# Patient Record
Sex: Female | Born: 1955 | Race: White | Hispanic: No | Marital: Married | Smoking: Never smoker
Health system: Southern US, Community
[De-identification: ages and names within clinical notes are randomized; demographics above are authoritative.]

## PROBLEM LIST (undated history)

## (undated) DIAGNOSIS — I10 Essential (primary) hypertension: Secondary | ICD-10-CM

## (undated) DIAGNOSIS — I82409 Acute embolism and thrombosis of unspecified deep veins of unspecified lower extremity: Secondary | ICD-10-CM

## (undated) DIAGNOSIS — C801 Malignant (primary) neoplasm, unspecified: Secondary | ICD-10-CM

## (undated) DIAGNOSIS — E785 Hyperlipidemia, unspecified: Secondary | ICD-10-CM

## (undated) DIAGNOSIS — D259 Leiomyoma of uterus, unspecified: Secondary | ICD-10-CM

## (undated) HISTORY — PX: ABDOMINAL HYSTERECTOMY: SHX81

## (undated) HISTORY — DX: Hyperlipidemia, unspecified: E78.5

## (undated) HISTORY — DX: Malignant (primary) neoplasm, unspecified: C80.1

## (undated) HISTORY — PX: GALLBLADDER SURGERY: SHX652

## (undated) HISTORY — PX: CHOLECYSTECTOMY: SHX55

## (undated) HISTORY — DX: Essential (primary) hypertension: I10

## (undated) HISTORY — DX: Leiomyoma of uterus, unspecified: D25.9

## (undated) HISTORY — PX: COLON SURGERY: SHX602

## (undated) HISTORY — DX: Acute embolism and thrombosis of unspecified deep veins of unspecified lower extremity: I82.409

---

## 2000-07-11 ENCOUNTER — Emergency Department (HOSPITAL_COMMUNITY): Admission: EM | Admit: 2000-07-11 | Discharge: 2000-07-11 | Payer: Self-pay | Admitting: Emergency Medicine

## 2000-07-11 ENCOUNTER — Encounter: Payer: Self-pay | Admitting: Emergency Medicine

## 2000-08-21 ENCOUNTER — Ambulatory Visit (HOSPITAL_COMMUNITY): Admission: RE | Admit: 2000-08-21 | Discharge: 2000-08-21 | Payer: Self-pay | Admitting: Neurosurgery

## 2000-08-21 ENCOUNTER — Encounter: Payer: Self-pay | Admitting: Neurosurgery

## 2000-09-12 ENCOUNTER — Ambulatory Visit (HOSPITAL_COMMUNITY): Admission: RE | Admit: 2000-09-12 | Discharge: 2000-09-12 | Payer: Self-pay | Admitting: Neurosurgery

## 2000-09-12 ENCOUNTER — Encounter: Payer: Self-pay | Admitting: Neurosurgery

## 2000-12-03 ENCOUNTER — Encounter: Payer: Self-pay | Admitting: Obstetrics and Gynecology

## 2000-12-03 ENCOUNTER — Ambulatory Visit (HOSPITAL_COMMUNITY): Admission: RE | Admit: 2000-12-03 | Discharge: 2000-12-03 | Payer: Self-pay | Admitting: Obstetrics and Gynecology

## 2001-01-12 ENCOUNTER — Encounter: Payer: Self-pay | Admitting: Family Medicine

## 2001-01-12 ENCOUNTER — Encounter: Admission: RE | Admit: 2001-01-12 | Discharge: 2001-01-12 | Payer: Self-pay | Admitting: Family Medicine

## 2001-11-30 ENCOUNTER — Other Ambulatory Visit: Admission: RE | Admit: 2001-11-30 | Discharge: 2001-11-30 | Payer: Self-pay | Admitting: Obstetrics and Gynecology

## 2001-12-07 ENCOUNTER — Encounter: Payer: Self-pay | Admitting: Obstetrics and Gynecology

## 2001-12-07 ENCOUNTER — Ambulatory Visit (HOSPITAL_COMMUNITY): Admission: RE | Admit: 2001-12-07 | Discharge: 2001-12-07 | Payer: Self-pay | Admitting: Obstetrics and Gynecology

## 2003-01-19 ENCOUNTER — Encounter: Admission: RE | Admit: 2003-01-19 | Discharge: 2003-01-19 | Payer: Self-pay | Admitting: Internal Medicine

## 2003-01-19 ENCOUNTER — Encounter: Payer: Self-pay | Admitting: Internal Medicine

## 2003-04-07 ENCOUNTER — Other Ambulatory Visit: Admission: RE | Admit: 2003-04-07 | Discharge: 2003-04-07 | Payer: Self-pay | Admitting: Obstetrics and Gynecology

## 2004-01-06 ENCOUNTER — Encounter: Admission: RE | Admit: 2004-01-06 | Discharge: 2004-01-06 | Payer: Self-pay | Admitting: Internal Medicine

## 2004-01-16 ENCOUNTER — Ambulatory Visit (HOSPITAL_COMMUNITY): Admission: RE | Admit: 2004-01-16 | Discharge: 2004-01-16 | Payer: Self-pay | Admitting: Orthopedic Surgery

## 2004-06-15 ENCOUNTER — Observation Stay (HOSPITAL_COMMUNITY): Admission: EM | Admit: 2004-06-15 | Discharge: 2004-06-16 | Payer: Self-pay | Admitting: *Deleted

## 2004-06-20 ENCOUNTER — Ambulatory Visit (HOSPITAL_COMMUNITY): Admission: RE | Admit: 2004-06-20 | Discharge: 2004-06-20 | Payer: Self-pay | Admitting: Internal Medicine

## 2004-11-28 ENCOUNTER — Ambulatory Visit: Payer: Self-pay | Admitting: Hematology & Oncology

## 2004-12-11 ENCOUNTER — Encounter (INDEPENDENT_AMBULATORY_CARE_PROVIDER_SITE_OTHER): Payer: Self-pay | Admitting: Specialist

## 2004-12-11 ENCOUNTER — Inpatient Hospital Stay (HOSPITAL_COMMUNITY): Admission: RE | Admit: 2004-12-11 | Discharge: 2004-12-13 | Payer: Self-pay | Admitting: Obstetrics and Gynecology

## 2004-12-17 ENCOUNTER — Ambulatory Visit (HOSPITAL_COMMUNITY): Admission: RE | Admit: 2004-12-17 | Discharge: 2004-12-17 | Payer: Self-pay | Admitting: Obstetrics and Gynecology

## 2005-07-30 ENCOUNTER — Encounter (INDEPENDENT_AMBULATORY_CARE_PROVIDER_SITE_OTHER): Payer: Self-pay | Admitting: Specialist

## 2005-07-30 ENCOUNTER — Ambulatory Visit (HOSPITAL_COMMUNITY): Admission: RE | Admit: 2005-07-30 | Discharge: 2005-07-31 | Payer: Self-pay

## 2005-10-15 ENCOUNTER — Encounter: Admission: RE | Admit: 2005-10-15 | Discharge: 2005-10-15 | Payer: Self-pay | Admitting: Internal Medicine

## 2007-07-14 ENCOUNTER — Emergency Department (HOSPITAL_COMMUNITY): Admission: EM | Admit: 2007-07-14 | Discharge: 2007-07-14 | Payer: Self-pay | Admitting: Emergency Medicine

## 2010-03-28 ENCOUNTER — Encounter: Admission: RE | Admit: 2010-03-28 | Discharge: 2010-03-28 | Payer: Self-pay | Admitting: Family Medicine

## 2010-04-02 ENCOUNTER — Encounter: Admission: RE | Admit: 2010-04-02 | Discharge: 2010-04-02 | Payer: Self-pay | Admitting: Family Medicine

## 2010-06-28 ENCOUNTER — Encounter: Admission: RE | Admit: 2010-06-28 | Discharge: 2010-06-28 | Payer: Self-pay | Admitting: Gastroenterology

## 2010-08-03 ENCOUNTER — Encounter (INDEPENDENT_AMBULATORY_CARE_PROVIDER_SITE_OTHER): Payer: Self-pay | Admitting: Surgery

## 2010-08-03 ENCOUNTER — Inpatient Hospital Stay (HOSPITAL_COMMUNITY)
Admission: RE | Admit: 2010-08-03 | Discharge: 2010-08-07 | Payer: Self-pay | Source: Home / Self Care | Attending: Surgery | Admitting: Surgery

## 2010-08-03 DIAGNOSIS — C801 Malignant (primary) neoplasm, unspecified: Secondary | ICD-10-CM

## 2010-08-03 HISTORY — DX: Malignant (primary) neoplasm, unspecified: C80.1

## 2010-08-16 ENCOUNTER — Ambulatory Visit: Payer: Self-pay | Admitting: Hematology and Oncology

## 2010-08-23 LAB — CBC WITH DIFFERENTIAL/PLATELET
BASO%: 0.8 % (ref 0.0–2.0)
Basophils Absolute: 0 10*3/uL (ref 0.0–0.1)
EOS%: 1.2 % (ref 0.0–7.0)
Eosinophils Absolute: 0 10*3/uL (ref 0.0–0.5)
HCT: 38.3 % (ref 34.8–46.6)
HGB: 12.9 g/dL (ref 11.6–15.9)
LYMPH%: 42.1 % (ref 14.0–49.7)
MCH: 30.5 pg (ref 25.1–34.0)
MCHC: 33.6 g/dL (ref 31.5–36.0)
MCV: 90.5 fL (ref 79.5–101.0)
MONO#: 0.3 10*3/uL (ref 0.1–0.9)
MONO%: 8.3 % (ref 0.0–14.0)
NEUT#: 1.8 10*3/uL (ref 1.5–6.5)
NEUT%: 47.6 % (ref 38.4–76.8)
Platelets: 229 10*3/uL (ref 145–400)
RBC: 4.23 10*6/uL (ref 3.70–5.45)
RDW: 13.2 % (ref 11.2–14.5)
WBC: 3.8 10*3/uL — ABNORMAL LOW (ref 3.9–10.3)
lymph#: 1.6 10*3/uL (ref 0.9–3.3)

## 2010-08-30 LAB — HYPERCOAGULABLE PANEL, COMPREHENSIVE
AntiThromb III Func: 125 % (ref 76–126)
Anticardiolipin IgA: 3 APL U/mL (ref <22)
Anticardiolipin IgG: 2 GPL U/mL (ref <23)
Anticardiolipin IgM: 1 MPL U/mL (ref <11)
Beta-2 Glyco I IgG: 1 G Units (ref <20)
Beta-2-Glycoprotein I IgA: 5 A Units (ref <20)
Beta-2-Glycoprotein I IgM: 3 M Units (ref <20)
DRVVT: 36.3 secs (ref 36.2–44.3)
Lupus Anticoagulant: NOT DETECTED
PTT Lupus Anticoagulant: 34.8 secs (ref 30.0–45.6)
Protein C Activity: 196 % — ABNORMAL HIGH (ref 75–133)
Protein C, Total: >150 % — ABNORMAL HIGH (ref 70–140)
Protein S Activity: 113 % (ref 69–129)
Protein S Ag, Total: 122 % (ref 70–140)

## 2010-08-30 LAB — COMPREHENSIVE METABOLIC PANEL
ALT: 35 U/L (ref 0–35)
AST: 26 U/L (ref 0–37)
Albumin: 4.5 g/dL (ref 3.5–5.2)
Alkaline Phosphatase: 125 U/L — ABNORMAL HIGH (ref 39–117)
BUN: 13 mg/dL (ref 6–23)
CO2: 28 mEq/L (ref 19–32)
Calcium: 9.4 mg/dL (ref 8.4–10.5)
Chloride: 106 mEq/L (ref 96–112)
Creatinine, Ser: 0.82 mg/dL (ref 0.40–1.20)
Glucose, Bld: 106 mg/dL — ABNORMAL HIGH (ref 70–99)
Potassium: 4.2 mEq/L (ref 3.5–5.3)
Sodium: 144 mEq/L (ref 135–145)
Total Bilirubin: 0.6 mg/dL (ref 0.3–1.2)
Total Protein: 6.8 g/dL (ref 6.0–8.3)

## 2010-08-30 LAB — CEA: CEA: <0.5 ng/mL (ref 0.0–5.0)

## 2010-09-10 ENCOUNTER — Ambulatory Visit (HOSPITAL_COMMUNITY)
Admission: RE | Admit: 2010-09-10 | Discharge: 2010-09-10 | Payer: Self-pay | Source: Home / Self Care | Attending: Hematology and Oncology | Admitting: Hematology and Oncology

## 2010-10-22 LAB — BASIC METABOLIC PANEL
BUN: 6 mg/dL (ref 6–23)
BUN: 12 mg/dL (ref 6–23)
CO2: 27 mEq/L (ref 19–32)
Calcium: 9.5 mg/dL (ref 8.4–10.5)
Chloride: 107 mEq/L (ref 96–112)
Chloride: 104 mEq/L (ref 96–112)
Creatinine, Ser: 0.87 mg/dL (ref 0.4–1.2)
GFR calc Af Amer: >60 mL/min (ref >60)
GFR calc non Af Amer: >60 mL/min (ref >60)
Glucose, Bld: 107 mg/dL — ABNORMAL HIGH (ref 70–99)
Glucose, Bld: 107 mg/dL — ABNORMAL HIGH (ref 70–99)
Sodium: 142 mEq/L (ref 135–145)

## 2010-10-22 LAB — CREATININE, SERUM: GFR calc Af Amer: >60 mL/min (ref >60)

## 2010-10-22 LAB — TYPE AND SCREEN
ABO/RH(D): O POS
Antibody Screen: NEGATIVE

## 2010-10-22 LAB — APTT: aPTT: 29 seconds (ref 24–37)

## 2010-10-22 LAB — PROTIME-INR
INR: 0.91 (ref 0.00–1.49)
Prothrombin Time: 12.5 seconds (ref 11.6–15.2)

## 2010-10-22 LAB — CBC
MCHC: 33.1 g/dL (ref 30.0–36.0)
MCV: 92.8 fL (ref 78.0–100.0)
Platelets: 167 10*3/uL (ref 150–400)
RBC: 3.61 MIL/uL — ABNORMAL LOW (ref 3.87–5.11)
RBC: 4.7 MIL/uL (ref 3.87–5.11)
RDW: 12.6 % (ref 11.5–15.5)
WBC: 4.4 10*3/uL (ref 4.0–10.5)

## 2010-10-22 LAB — SURGICAL PCR SCREEN: Staphylococcus aureus: NEGATIVE

## 2010-10-22 LAB — ABO/RH: ABO/RH(D): O POS

## 2010-12-28 NOTE — Consult Note (Signed)
[name], [name]           ACCOUNT NO.:  [id number]   MEDICAL RECORD NO.:  [id number]          PATIENT TYPE:  EMS   LOCATION:  ED                           FACILITY:  [facility]   PHYSICIAN:  Charles A. Delcambre, MDDATE OF BIRTH:  1956/08/05   DATE OF CONSULTATION:  06/15/2004  DATE OF DISCHARGE:                                   CONSULTATION   This is a 55 year old para 1-0-0-1, LMP started 1-1/2 months ago, off and on  bleeding, on Coumadin therapy 10 mg per day for a DVT of the left foot, for  the last 5 months.  She had last INR 2.7 about 6 weeks ago.  She has not had  her INR checked since that time.  She takes Coumadin 10 mg a day Monday,  Wednesday, Friday, 15 mg Tuesday, Thursday, and Saturday.  She has been on  this dosing schedule.  She noted bleeding to start heavily last evening.  She denies dizziness but is very weak at this time, feels she cannot drive  and for this reason, I have directed her to go the emergency room.  She is  dispositioned to go the emergency room via EMS.  She is in the office with  findings as noted below with pertinent finding of anemia, hematocrit 24.4  and hemoglobin 8.3, pro time 28.7, INR 4.7.   PAST MEDICAL HISTORY:  1.  DVT ? left foot 3 inches long in June 2005.  2.  Gallstones.   PAST SURGICAL HISTORY:  C-section x 1.   MEDICATIONS:  Coumadin as noted above.   ALLERGIES:  No noted allergies.   SOCIAL HISTORY:  No tobacco, ethanol, or drug abuse.  States she is married.   FAMILY HISTORY:  No carcinoma, limited English.  She denies other major  medical illnesses.   REVIEW OF SYSTEMS:  As noted above.   PHYSICAL EXAMINATION:  VITAL SIGNS:  Blood pressure 130/88, pulse 90s.  GENERAL:  Alert and oriented x 3, no distress.  Weight 196 pounds.  Height  5.3 feet 7-3/4 inches.  NECK:  Supple without thyromegaly or adenopathy.  LUNGS:  Clear bilaterally.  BACK:  No CVA.  Vertebral nontender to palpation.  No CVA tenderness  bilaterally.  BREAST EXAM:  Deferred.  Breasts are symmetrical.  HEART:  Regular rate and rhythm without murmur, rub, or gallop.  ABDOMEN:  Soft, nontender.  PELVIC:  Copious amount of bleeding is noted freely flowing from the  cervical os during the first 45 minutes of examination.  Clots were removed  from the cervical os.  The blood in the vagina would not clot.  With further  examination bimanual examination, blood was extruded from the vault.  The  uterus was 12-14 week size, minimally tender, consistent with uterine  fibroids.  On further examination and evaluation, bleeding did slow.   Laboratory was drawn with findings as noted above.  Transvaginal ultrasound  was then taken.  Uterine fibroid was noted, appearing to be submucous, 8 x  10 cm.  Ovaries appeared normal bilaterally.  A small cyst was noted on the  right ovary approximately 2 x  3 cm, simple in appearance.   ASSESSMENT:  1.  Menorrhagia.  2.  Coagulopathy.  3.  Coumadin therapy for deep venous thrombosis, about 5 months into      therapy.  4.  Uterine fibroid.   PLAN:  The patient needs reversal of coagulopathy to a point consistent with  cessation of menses as most likely cause of the uterine bleeding is  coagulopathy at this time.  She gives informed consent.  She understands  that she very likely will need a blood transfusion.  Accepts risks of  hepatitis and HIV exposure.  She understands increased risk of reversal of  anticoagulated state could contribute to recurrence of deep venous  thrombosis with inherent risks of pulmonary embolus and even death, but  current risk inherent with the heavy bleeding poses a significant threat to  her life as well.  Furthermore, a fibroid could be cause of her bleeding but  in light of the significant coagulopathy currently, coagulopathy is most  likely cause of the bleeding.  Endometrial biopsy could be warranted but  with the degree of bleeding at this time, this would be  likely aspirating  blood and is not undertaken.  The patient states she is too weak and the  bleeding is of such nature that she cannot drive.  For this reason, she is  transported to [place] via EMS.  I have spoken to the emergency room doctor  there, who accepts and is aware that the patient is coming.  She will likely  need admission, and I have paged [facility] to arrange for  admission.      CAD/MEDQ  D:  06/15/2004  T:  06/15/2004  Job:  [id number]   cc:   Sharlet Salina, M.D.  [address]  [place]  [place] [address]  Fax: [phone]

## 2010-12-28 NOTE — H&P (Signed)
NAME:  [name], [name]           ACCOUNT NO.:  [id number]   MEDICAL RECORD NO.:  [id number]          PATIENT TYPE:  INP   LOCATION:  NA                            FACILITY:  [facility]   PHYSICIAN:  Charles A. Delcambre, MDDATE OF BIRTH:  [birth year]   DATE OF ADMISSION:  DATE OF DISCHARGE:                                HISTORY & PHYSICAL   ADDENDUM:  This history and physical is dictated from November 20, 2004,  initially for admission on November 27, 2004, now rescheduled for admission on  Dec 11, 2004, for hysterectomy for menorrhagia, severe, with anemia.  She  will be admitted once again to undergo hysterectomy.  Physical exam with  heart and lung exam is updated today.  She did get further workup with a  venous Doppler and left lower extremity normal except for some insufficiency  in the upper lower extremity.  Lower extremity normal.  Area in the foot,  previous clot, unclear at this time.  She has protein C and S normal,  anticardiolipin negative.  Factor V Leiden pending.  Negative prior to this.  Antithrombin-3 slightly elevated.  In discussion with Dr. Marny Lowenstein, her PCP,  she and I both agree that we will use Lovenox starting postop day 1 for five  to six days and start Coumadin.  I would like to go on with 10 mg per day.  She had been maintained on 15 mg per day but did have a coagulopathy, even  necessitating admission with reversal at one point on this dose.  She comes  in with her husband today, gives informed consent once again for surgery, as  she had prior.  They do understand risk of DVT and anticoagulation as stated  in the plan, and we will go on at this point to proceed with surgery, SCDs  perioperatively and anticoagulation as noted with Lovenox and Coumadin.  She  was in agreement and will follow up as directed, n.p.o. past midnight the  evening prior to surgery.  She has been started on Toprol XL 50 mg a day for  hypertension and will take her blood pressure medicine the  morning of  surgery.   Please get a copy of H&P from November 20, 2004, onto chart with this addendum.      CAD/MEDQ  D:  12/07/2004  T:  12/07/2004  Job:  [id number]

## 2010-12-28 NOTE — Op Note (Signed)
[name], [name]           ACCOUNT NO.:  [id number]   MEDICAL RECORD NO.:  [id number]          PATIENT TYPE:  AMB   LOCATION:  DAY                          FACILITY:  [facility]   PHYSICIAN:  Lebron Conners, M.D.   DATE OF BIRTH:  Jul 12, 1956   DATE OF PROCEDURE:  07/30/2005  DATE OF DISCHARGE:                                 OPERATIVE REPORT   PREOPERATIVE DIAGNOSIS:  Symptomatic gallstones.   POSTOPERATIVE DIAGNOSIS:  Symptomatic gallstones.   OPERATION:  Laparoscopic cholecystectomy.   SURGEON:  Lebron Conners, M.D.   ANESTHESIA:  General and local.   DESCRIPTION OF PROCEDURE:  After the patient was monitored and anesthetized  and had routine preparation and draping of the abdomen, I injected local  anesthetic just below the umbilicus and made a 2 cm longitudinal incision at  the upper end of the previous lower midline incision. I dissected down  through the fat and got hemostasis and incised the fascia about 2 cm in the  midline and bluntly entered the peritoneal cavity. I placed a #0 Vicryl  pursestring suture and secured a Hassan cannula and inflated the abdomen  with carbon dioxide. There were a lot of adhesions in the pelvis as  expected. The right upper quadrant was easily except accessible. The  gallbladder was somewhat enlarged and had a good bit of adhesion to its  undersurface. After placement of an 11 mm epigastric port and two 5 mm right  mid abdominal ports through anesthetized sites under direct view, I  retracted the fundus of the gallbladder upward and took down the adhesions  until I could clearly identify the infundibulum. I pulled the infundibulum  laterally and dissected the hepatoduodenal ligament until I clearly  identified the cystic duct emerging from the infundibulum of the gallbladder  and had a nice window anteriorly and posteriorly and saw the cystic artery  crossing the triangle of Calot. I clipped the cystic duct with four clips  and clipped  the cystic artery with three clips and cut the two structures  between the two clips closest to the gallbladder on each. I then used the  cautery and the hook dissector to remove the gallbladder from the  gallbladder fossa and got good hemostasis in the gallbladder fossa. I got  the gallbladder off without making any holes in it. After irrigating the  right upper quadrant and removing the irrigant and observing good  hemostasis, I removed the gallbladder through the umbilical incision and  tied the pursestring suture making sure that I did not trap any viscera.  After removing the remaining irrigant lateral to the  liver, I removed the two lateral ports under direct view and saw no bleeding  from the abdominal wall. I allowed the carbon dioxide to escape through the  epigastric port and removed that as well. I then closed all skin incisions  with intracuticular 4-0 Vicryl and Steri-Strips. The patient was stable  throughout the operation.      Lebron Conners, M.D.  Electronically Signed     WB/MEDQ  D:  07/30/2005  T:  07/31/2005  Job:  [id number]   cc:  Francis P. Modesto Charon, M.D.  Fax: [phone]

## 2010-12-28 NOTE — Discharge Summary (Signed)
NAME:  [name], [name]           ACCOUNT NO.:  [id number]   MEDICAL RECORD NO.:  [id number]          PATIENT TYPE:  INP   LOCATION:  9317                          FACILITY:  [facility]   PHYSICIAN:  Charles A. Delcambre, MDDATE OF BIRTH:  [birth year]   DATE OF ADMISSION:  12/11/2004  DATE OF DISCHARGE:  12/13/2004                                 DISCHARGE SUMMARY   PRIMARY DISCHARGE DIAGNOSES:  1.  Menorrhagia.  2.  Uterine fibroids.  3.  Adenofibroma, left ovary.  4.  Serous cystadenoma, right ovary.   DISPOSITION:  The patient was discharged home to followup in the office in  four days to discontinue staples and to have pro time checked. She was given  convalescent instructions to rest at home, no driving for two weeks, no  heavy lifting of greater than 25 pounds for four weeks. She was to call for  any temperature greater than 100 degrees, any incision redness or drainage  or increasing abdominal pain or other problem. She was instructed to inject  subcutaneously Lovenox 40 mg once a day and to take Coumadin 7.5 mg once a  day, Percocet 5/325, 1-2 p.o. q.4 h p.r.n. #30, a prescription was given.  Hemocyte 1 p.o. q.d. #30 as well as Coumadin 7.5 mg 1 p.o. q.d., #100,  refill x1, prescriptions were given as well.  Lovenox 40 mg #5 injections  with one refill were given as well.   LABORATORY DATA:  Postoperative hematocrit 25.7 and then recheck on next day  on Lovenox hematocrit was 27.0, hemoglobin 9.0, platelets at that time 217.   History and physical is dictated on the chart. Pathology returned as noted  above.   HOSPITAL COURSE:  The patient was admitted and underwent surgery as noted  above. The day of surgery, she had a normal postoperative course. On  postoperative day #1, evening of surgery, she had a Tmax of 100.7. She had a  benign examination, good urine output, no localizing signs or symptoms, most  likely atelectasis was source of low grade temperature. She was started  on  Lovenox and ambulated. Diet was advanced as tolerated. She had spontaneous  return of flatus. On postoperative day #2, she defervesced, had no further  febrile morbidity. Cefoxitin was continued for 24 hours, thereafter  discontinued. She was observed for approximately 12 hours post cefoxitin,  had no febrile morbidity. Clinical exam was benign and she desired to be  discharged home. Thereafter she was given strict precautions as noted above  and was allowed discharge. Followup as noted above.      CAD/MEDQ  D:  12/13/2004  T:  12/13/2004  Job:  [id number]

## 2010-12-28 NOTE — H&P (Signed)
NAME:  [name], [name]           ACCOUNT NO.:  [id number]   MEDICAL RECORD NO.:  [id number]          PATIENT TYPE:  INP   LOCATION:  0102                         [facility]ACILITY:  [facility]   PHYSICIAN:  Jackie Plum, M.D.DATE OF BIRTH:  12-Jul-1956   DATE OF ADMISSION:  06/15/2004  DATE OF DISCHARGE:                                HISTORY & PHYSICAL   CHIEF COMPLAINT:  Bleeding per vagina x1-1/2 months.   HISTORY OF PRESENT ILLNESS:  Patient is a [place] immigrant who presents  with the above complaint.  Patient had been referred to see Dr. Sydnee Cabal of  gynecology by her primary care physician on account of this problem.  According to the consultation report by the PCP, pelvic exam at the office  was significant for a copious amount of bleeding, freely flowing from the  cervical os with clots.  The uterus was enlarged at about 12-14 weeks size  and minimally tender.  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  Subsequent transvaginal ultrasound noted uterine fibromyomata, which was  submucosal, about 8-10 cm with normal-appearing ovaries bilaterally.  A  small cystic right ovary, about 2 x 3 cm, was noted, which was said to be  simple in appearance.  Patient had complained of some dizziness; therefore,  she was referred by EMS to the emergency room for further evaluation and  possibly admission.  On arrival to the ED, patient's temperature was 99.0,  with BP of 143/85, pulse 80, respirations 18, sats 99% on room air.  She was  started on FFP on account of her coagulopathy noted on blood work, during  this admission.  Patient denies any chest pain, palpitations, shortness of  breath.  She complains of lightheadedness, which has improved since ER  evaluation.  She continues to bleed per vagina, but she says her bleeding  has slowed down somewhat since being in the ED.   PAST MEDICAL HISTORY:  History of uterine fibromyomata.  She does have a  history of left lower extremity DVT  and has been on Coumadin since January 16, 2004 for this DVT.  She also gives a history of menorrhagia over the last  few months.  She denies any history of fever or chills.  She denies any  lower abdominal pain.  No history of dysuria or frequency of micturition.  She denies any vaginal discharge.   CURRENT MEDICATIONS:  She is on Coumadin at home.  She takes 10 mg of  Coumadin on Monday, Wednesday, and Friday, and 15 mg daily on the other  days.  She is on Coumadin, as I said earlier, since January 16, 2004 for left  lower extremity DVT (about five months now).   ALLERGIES:  No known medical allergies.   FAMILY HISTORY:  Negative for heart disease.   SOCIAL HISTORY:  Patient smokes no cigarettes nor drink alcohol.   REVIEW OF SYSTEMS:  Significant positives and negatives as noted on the HPI  above, otherwise review of systems is unremarkable.   PHYSICAL EXAMINATION:  VITAL SIGNS:  Temperature 98.3 degrees Fahrenheit, BP  107/69, pulse 74, respirations 20.  Sat of 98% on  room air.  GENERAL:  Patient is not in any acute cardiopulmonary distress; however, she  looks a bit weak.  HEENT:  Pupils are equal, round and reactive to light.  Extraocular  movements are intact.  She has conjunctivae pallor without icterus.  Oropharynx is moist without any injection or erythema.  NECK:  Supple.  No JVD.  No thyromegaly.  LUNGS:  Fascicular breath sounds, which were adequate without any wheezes or  crackles.  HEART:  Regular rate and rhythm without any gallops or murmur.  ABDOMEN:  Full.  Soft.  Nontender.  Bowel sounds were present and  normoactive.  EXTREMITIES:  Negative for any edema or cyanosis.  PELVIC:  Not done.  (Please see dictation regarding pelvic exam by Dr.  Sydnee Cabal of [facility]).   LAB WORK:  Vaginal ultrasound, as noted on the HPI.   WBC count 5.2, hemoglobin 8.2, hematocrit 25.3, MCV 80.5, platelet count  248, pro time 36.6, INR 6.2.  Sodium 139, potassium 4.1, chloride  110, CO2  28.  Glucose 117, BUN 9, creatinine 0.8, bilirubin 0.5, alkaline phosphatase  78, SGOT 20, SGPT 17, total protein 5.8, albumin 3.5, calcium 8.3.   IMPRESSION:  1.  Acute blood loss per vagina secondary to coagulopathy related to      Coumadin toxicity.  2.  Uterine fibromyomata.  3.  Anemia secondary to diagnosis #1.  4.  Menorrhagia.   PLAN OF CARE:  Patient is hemodynamically stable.  Will admit her to a  telemetry bed.  Will transfuse her with FFP and give her subcutaneous  vitamin K.  Her hematologic, i.e., hemoglobin will be monitored carefully  and PRBC transfusions will be instituted as needed.   Patient has been on Coumadin for DVT.  It is not clear to me whether this  was due to any discernable etiology; however, I believe that __________  treatment will suffice.  If surgery is unclear, and recommend that patient  not be put back on Coumadin after discharge, but this will need to be  clarified with the patient's PCP, regarding workup that was done for her  when she had a DVT.     Geor   GO/MEDQ  D:  06/15/2004  T:  06/15/2004  Job:  [id number]   cc:   Sharlet Salina, M.D.  [address]  [place]  [place] [address]  Fax: [phone]   Leonette Most A. Sydnee Cabal, MD  Fax: [phone]

## 2010-12-28 NOTE — Op Note (Signed)
NAME:  JERMIAH, HOWTON           ACCOUNT NO.:  [id number]   MEDICAL RECORD NO.:  [id number]          PATIENT TYPE:  INP   LOCATION:  9399                          FACILITY:  [facility]   PHYSICIAN:  Charles A. Delcambre, MDDATE OF BIRTH:  October 22, 1955   DATE OF PROCEDURE:  12/11/2004  DATE OF DISCHARGE:                                 OPERATIVE REPORT   PREOPERATIVE DIAGNOSES:  1.  Menorrhagia.  2.  Fibroids.   POSTOPERATIVE DIAGNOSES:  1.  Menorrhagia.  2.  Fibroids.  3.  Severe pelvic adhesive disease.  4.  Probable left ovarian fibroma.   PROCEDURES:  1.  Transabdominal hysterectomy.  2.  Right salpingo-oophorectomy.  3.  Left ovarian mass resection.  4.  Extensive lysis of adhesions requiring extensive dissection assistant      and dissection greater than 45 minutes of bowel adhesions.   SURGEON:  Charles A. Sydnee Cabal, M.D.   ASSISTANT:  Artist Pais, M.D.   COMPLICATIONS:  Extensive adhesions noted as above.   SPECIMENS:  Uterus, right tube and ovary and left ovarian mass partial  resection of the ovary to pathology.   URINE OUTPUT:  1600 mL.   IV FLUID:  Two liters of lactated Ringer's.   Sponge, needle and instrument counts were correct x2.   ESTIMATED BLOOD LOSS:  850 mL.   ANESTHESIA:  General by the endotracheal route.   DESCRIPTION OF PROCEDURE:  Patient was taken to the operating room and  placed in the supine position.  General anesthesia was induced without  difficulty.  Sterile prep and drape was undertaken.  A Pfannenstiel incision  was made with knife carried down to fascia.  Fascia was incised with the  Mayo scissors.  Mayo scissors were used to extend the incision.  Rectus  sheath was released superiorly and inferiorly sharply and rectus muscles  were sharply dissected in the midline.  Peritoneum was entered and this  incision was extended without damage of bowel or bladder or vascular  structures.  Balfour retractor was placed.  Cornual clamps were  placed on  the uterus and extensive adhesions were noted in the posterior aspect of the  uterus and in the left adnexa.  Carefully working against the uterus and off  of the bowel.  Metzenbaum scissors were used and small-bowel and sigmoid  colon were taken off the posterior aspect of the uterus and left adnexa.  This was done with great care and did not damage the bowel.  I did leave  open areas of the mesentery of the bowel which were minimally bleeding.  One  area was bleeding briskly and was crossclamped with the right ankle clamp  and tied with 3-0 Vicryl free tie.  Hemostasis was adequate.  Further  adhesions were noted.  Loops of large-bowel and small-bowel in free loops.  These were taken apart to minimize risk of herniation and strangulation or  bowel obstruction.  This was done carefully without injury to the bowel.  Free peritoneal edges away from the bowel were electrocauterized and tied  with 2-0 Vicryl at other points.  Mesentery in several areas was closed with  running nonlocking suture of 3-0 Vicryl with good hemostasis.  No  enterotomies were noted and careful dissection, no evidence of bowel  injuries were noted.  No bowel sutures were placed.  Bowel was dissected  down the posterior aspect of the uterus down to expose the cul-de-sac  posteriorly and the adnexa on the left.  Round ligaments were transected on  either side.  Broad ligaments were opened.  The ovary was very adherent to  the sigmoid colon, almost blending into the sigmoid colon on the left.  For  this reason, election was made at this point to not try to obtain the  dissection in the infundibulopelvic pedicle.  For this reason, the ovary was  left in situ.  There was a pedunculated mass attached to the ovary and this  was taken.  Free tie was placed, transfixion stitched and later a 2-0 Vicryl  suture was used to running lock the area where the mass was taken free with  good hemostasis.  Mass was sent to  pathology separately.  Broad ligament was  opened on the right.  Infundibulopelvic pedicle was isolated, well clear of  the ureter, free tied and transfixion stitch, hemostasis was excellent.  Bladder was very adherent anteriorly high on the uterus consistent with  previous cesarean section.  Major dissection to take this bladder down was  undertaken with Metzenbaum scissors and some blunt dissection.  Bladder was  taken down adequately in the midline and allowed further exposure of the  uterine vessels on either side.  Uterine vessels were crossclamped with  moderate hemostasis but further clamps taken down the cardinal ligaments  yielded good hemostasis bilaterally.  Uterine fundus was amputated from the  lower uterine segment at that point to allow greater exposure.  Final  cardinal ligaments were taken down on either side.  Uterus, uterine cornua  regions and uterosacral ligaments were clamped and held.  Cervix was  amputated from the vaginal cuff.  Richardson angle sutures were placed on  the vaginal angles.  Running, locking 0 Vicryl was then used to close the  vaginal cuff.  Irrigation was carried out and minor electrocautery was used  on the edge of the bladder to achieve hemostasis.  Two areas of 3-0 chromic  were used on stitch to achieve hemostasis on the bladder.  There was  otherwise no injury to the bladder noted.  Irrigation was carried out.  Posterior cuff had a peritoneal edge that was bleeding.  This was closed  with figure-of-eight sutures of 2-0 Vicryl.  All other areas were noted to  be of excellent hemostasis.  Pedicles were visualized.  Ovary pedicle was  visualized.  Sterile milk was placed in the bladder.  There was no evidence  of bladder leak or injury.  Bowel was run in the area of  dissection, small-  bowel and large-bowel.  One area as previously noted had been sutured, as  far as slightly injured are of the mesentery; again, no evidence of injury to the bowel  was noted.  All areas of dissection were visualized and noted  to be of good hemostasis.  Irrigation was carried out.  Hemostasis was  verified at all areas.  Laps were removed.  Balfour retractor was removed.  Subfascial hemostasis was excellent.  Fascia was then closed with #1 Vicryl  running nonlocking suture. Subcutaneous hemostasis was excellent.  Irrigation was carried out. A 2-0 Vicryl in subcuticular interrupted sutures  was used to close this layer.  Sterile  skin clips were used to close the  skin.  The patient was taken to the recovery room with physician in  attendance.      CAD/MEDQ  D:  12/11/2004  T:  12/11/2004  Job:  [id number]

## 2010-12-28 NOTE — H&P (Signed)
NAME:  [name], [name]           ACCOUNT NO.:  [id number]   MEDICAL RECORD NO.:  [id number]          PATIENT TYPE:  INP   LOCATION:  NA                            FACILITY:  [facility]   PHYSICIAN:  Charles A. Delcambre, MDDATE OF BIRTH:  22-Oct-1955   DATE OF ADMISSION:  11/27/2004  DATE OF DISCHARGE:                                HISTORY & PHYSICAL   This patient is to be admitted on November 27, 2004, to undergo abdominal  hysterectomy and bilateral oopherectomy for menorrhagia unresponsive to  medical management with history complicated by DVT, Coumadin therapy, even  to the point of bleeding that she had to be transferred from our office to  [facility] for hemorrhage and admitted with reversal of  Coumadin to arrest the bleeding.  This was temporary with over-  anticoagulation at that time.  Coagulopathy was reversed and bleeding  ceased.  She has completed Coumadin course of 6 months for a left foot DVT,  somewhat superficial, but deemed necessary to anticoagulate with this DVT  noted in June of 2005.  She has been profoundly anemic and has now  hemoglobin in the 7 to 8 range and has now built her hemoglobin up to the 9  range.  Last hemoglobin 9.8 on October 23, 2004, with iron therapy.  She has  received one shot of Depo-Provera with amenorrheic result.   PAST MEDICAL HISTORY:  PCP is Sharlet Salina, M.D. [facility].  She is hypertensive and has not followed up with medication.  Gallstones.   PAST SURGICAL HISTORY:  Cesarean section x1.   MEDICATIONS:  Iron therapy.   ALLERGIES:  No known drug allergies.   SOCIAL HISTORY:  No tobacco, ethanol, or drug use.  She is married.   FAMILY HISTORY:  Somewhat limited English in the family.  No major illnesses  otherwise.   REVIEW OF SYSTEMS:  Negative for review except for things noted above.  No  chest pain, shortness of breath, wheezing.  No headaches, nausea, vomiting,  or diarrhea.  No bleeding per  rectum.   PHYSICAL EXAMINATION:  Negative for review.  VITAL SIGNS:  Blood pressure today 150/98, heart rate 72, weight 200 pounds,  respirations 18.  HEENT:  Grossly within normal limits.  NECK:  Supple without thyromegaly or adenopathy.  LUNGS:  Clear bilaterally.  HEART:  Regular rate and rhythm without murmurs, rubs, or gallops.  BREASTS:  Symmetrical, otherwise examination deferred.  ABDOMEN:  Soft, flat, and nontender.  Mild obesity is noted.  Limited  examination.  PELVIC:  Nulliparous appearing cervix.  No cervical motion tenderness.  Normal external female genitalia.  Bimanual examination; uterus 12 to 14  weeks size consistent with uterine fibroids, otherwise examination limited  by the patient's body habitus.  Adnexa nontender.  Examination limited.  Ovaries not palpable bilaterally.  EXTREMITIES:  Nontender.  Cords are not palpable.  Ankles nontender.  Cord  not palpable in the area of the past DVT.  Nontender superficially.   ASSESSMENT:  Menorrhagia, history of deep venous thrombosis, fibroid uterus.   PLAN:  Transabdominal hysterectomy, bilateral salpingo-oophorectomy.  Postoperative Lovenox start at 24 hours post-op, Coumadin for 3 months.  She  has had hypercoagulable workup.  Discussion is undertaken with Dr. Marny Lowenstein  and she concurs with Coumadin for 3 months with Lovenox immediately  postoperatively.  The patient accepts risks of infection, bleeding, bowel or  bladder damage, ureteral damage, blood requirement including hepatitis and  HIV exposure, DVT risk, anesthesia risks generally, incisional infection,  seroma, fistula formation.  All questions were answered and we will proceed  as outlined.   Preoperative CBC, Chem-20.  Cefotan 1 gram or substitution IV.  PT, PTT, INR  baseline.  Serum hCG, EKG.  SCDs to the thigh will be used perioperatively.  NPO past midnight.  To Dr. Marny Lowenstein tomorrow for consultation regarding  getting onto a medicine for  hypertension.      CAD/MEDQ  D:  11/20/2004  T:  11/20/2004  Job:  [id number]

## 2010-12-31 ENCOUNTER — Other Ambulatory Visit: Payer: Self-pay | Admitting: Hematology and Oncology

## 2010-12-31 ENCOUNTER — Encounter (HOSPITAL_BASED_OUTPATIENT_CLINIC_OR_DEPARTMENT_OTHER): Payer: 59 | Admitting: Hematology and Oncology

## 2010-12-31 DIAGNOSIS — I82509 Chronic embolism and thrombosis of unspecified deep veins of unspecified lower extremity: Secondary | ICD-10-CM

## 2010-12-31 DIAGNOSIS — C189 Malignant neoplasm of colon, unspecified: Secondary | ICD-10-CM

## 2010-12-31 LAB — CBC WITH DIFFERENTIAL/PLATELET
BASO%: 0.3 % (ref 0.0–2.0)
EOS%: 1.6 % (ref 0.0–7.0)
MCH: 31.2 pg (ref 25.1–34.0)
NEUT#: 2 10*3/uL (ref 1.5–6.5)
NEUT%: 46.8 % (ref 38.4–76.8)
RDW: 13.1 % (ref 11.2–14.5)
WBC: 4.3 10*3/uL (ref 3.9–10.3)

## 2010-12-31 LAB — COMPREHENSIVE METABOLIC PANEL
ALT: 22 U/L (ref 0–35)
AST: 21 U/L (ref 0–37)
Albumin: 4.4 g/dL (ref 3.5–5.2)
Alkaline Phosphatase: 92 U/L (ref 39–117)
CO2: 27 mEq/L (ref 19–32)
Glucose, Bld: 104 mg/dL — ABNORMAL HIGH (ref 70–99)
Sodium: 140 mEq/L (ref 135–145)
Total Protein: 6.7 g/dL (ref 6.0–8.3)

## 2011-01-02 ENCOUNTER — Encounter (HOSPITAL_BASED_OUTPATIENT_CLINIC_OR_DEPARTMENT_OTHER): Payer: 59 | Admitting: Hematology and Oncology

## 2011-01-02 ENCOUNTER — Other Ambulatory Visit: Payer: Self-pay | Admitting: Hematology and Oncology

## 2011-01-02 DIAGNOSIS — C19 Malignant neoplasm of rectosigmoid junction: Secondary | ICD-10-CM

## 2011-01-02 DIAGNOSIS — C189 Malignant neoplasm of colon, unspecified: Secondary | ICD-10-CM

## 2011-01-02 DIAGNOSIS — Z9049 Acquired absence of other specified parts of digestive tract: Secondary | ICD-10-CM

## 2011-01-04 ENCOUNTER — Encounter (INDEPENDENT_AMBULATORY_CARE_PROVIDER_SITE_OTHER): Payer: Self-pay | Admitting: Surgery

## 2011-04-02 ENCOUNTER — Ambulatory Visit (INDEPENDENT_AMBULATORY_CARE_PROVIDER_SITE_OTHER): Payer: 59 | Admitting: Surgery

## 2011-04-02 ENCOUNTER — Encounter (INDEPENDENT_AMBULATORY_CARE_PROVIDER_SITE_OTHER): Payer: Self-pay | Admitting: Surgery

## 2011-04-02 VITALS — Temp 97.7°F | Wt 196.2 lb

## 2011-04-02 DIAGNOSIS — Z85038 Personal history of other malignant neoplasm of large intestine: Secondary | ICD-10-CM

## 2011-04-02 NOTE — Progress Notes (Signed)
Subjective:     Patient ID: [name], female   DOB: [birth year], 55 y.o.   MRN: [id number]  HPI  She is here today for a long-term followup of colon cancer. 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 She is being followed at the cancer center. She saw him in May of this year and her CEA level was normal. She has no complaints today. She is moving her bowels well and eating well. Review of Systems     Objective:   Physical Exam On exam, her incision is healing well without evidence of hernia. There are no abdominal masses and her abdomen is nontender.    Assessment:     Long-term followup of colon cancer status post low anterior resection in 2011.    Plan:     From a general surgical standpoint, I have nothing further to offer at this time. She will be seeing me her gastroenterologist later this year for another colonoscopy and will continue to be followed by the cancer center. I will see her as needed.

## 2011-05-20 LAB — BASIC METABOLIC PANEL
CO2: 26
Chloride: 102
GFR calc Af Amer: >60
Potassium: 3.1 — ABNORMAL LOW
Sodium: 139

## 2011-05-20 LAB — DIFFERENTIAL
Eosinophils Absolute: 0 — ABNORMAL LOW
Lymphs Abs: 1.2
Monocytes Absolute: 0.4
Monocytes Relative: 6
Neutrophils Relative %: 78 — ABNORMAL HIGH

## 2011-05-20 LAB — URINE MICROSCOPIC-ADD ON

## 2011-05-20 LAB — CBC
HCT: 42.5
Hemoglobin: 14.9
MCV: 89.7
RBC: 4.74
WBC: 7.6

## 2011-05-20 LAB — URINALYSIS, ROUTINE W REFLEX MICROSCOPIC
Protein, ur: 30 — AB
pH: 8

## 2011-07-13 ENCOUNTER — Telehealth: Payer: Self-pay | Admitting: Hematology and Oncology

## 2011-07-13 NOTE — Telephone Encounter (Signed)
Moved 12/5 appt to jan per LO due to RJ's absence. lmonvm for pt re cancelling 12/5 f/u and rescheduling it to 1/15 @ 10 am. Pt to [facility] for lb/ct 12/3.

## 2011-07-15 ENCOUNTER — Telehealth: Payer: Self-pay | Admitting: Hematology and Oncology

## 2011-07-15 ENCOUNTER — Ambulatory Visit (HOSPITAL_COMMUNITY)
Admission: RE | Admit: 2011-07-15 | Discharge: 2011-07-15 | Disposition: A | Discharge status: Home / Self Care | Payer: 59 | Source: Ambulatory Visit | Attending: Hematology and Oncology | Admitting: Hematology and Oncology

## 2011-07-15 ENCOUNTER — Other Ambulatory Visit: Payer: Self-pay | Admitting: Hematology and Oncology

## 2011-07-15 ENCOUNTER — Encounter: Payer: Self-pay | Admitting: *Deleted

## 2011-07-15 ENCOUNTER — Other Ambulatory Visit (HOSPITAL_BASED_OUTPATIENT_CLINIC_OR_DEPARTMENT_OTHER): Payer: 59 | Admitting: Lab

## 2011-07-15 DIAGNOSIS — N83209 Unspecified ovarian cyst, unspecified side: Secondary | ICD-10-CM | POA: Insufficient documentation

## 2011-07-15 DIAGNOSIS — C189 Malignant neoplasm of colon, unspecified: Secondary | ICD-10-CM | POA: Insufficient documentation

## 2011-07-15 DIAGNOSIS — I82509 Chronic embolism and thrombosis of unspecified deep veins of unspecified lower extremity: Secondary | ICD-10-CM

## 2011-07-15 DIAGNOSIS — Z9049 Acquired absence of other specified parts of digestive tract: Secondary | ICD-10-CM | POA: Insufficient documentation

## 2011-07-15 DIAGNOSIS — Z90710 Acquired absence of both cervix and uterus: Secondary | ICD-10-CM | POA: Insufficient documentation

## 2011-07-15 LAB — CBC WITH DIFFERENTIAL/PLATELET
BASO%: 0.6 % (ref 0.0–2.0)
Basophils Absolute: 0 10e3/uL (ref 0.0–0.1)
EOS%: 1 % (ref 0.0–7.0)
Eosinophils Absolute: 0 10e3/uL (ref 0.0–0.5)
HCT: 41.8 % (ref 34.8–46.6)
HGB: 14.2 g/dL (ref 11.6–15.9)
LYMPH%: 40.3 % (ref 14.0–49.7)
MCH: 31.2 pg (ref 25.1–34.0)
MCHC: 34 g/dL (ref 31.5–36.0)
MCV: 91.8 fL (ref 79.5–101.0)
MONO#: 0.3 10e3/uL (ref 0.1–0.9)
MONO%: 6.8 % (ref 0.0–14.0)
NEUT#: 2.3 10e3/uL (ref 1.5–6.5)
NEUT%: 51.3 % (ref 38.4–76.8)
Platelets: 165 10e3/uL (ref 145–400)
RBC: 4.56 10e6/uL (ref 3.70–5.45)
RDW: 13.2 % (ref 11.2–14.5)
WBC: 4.4 10e3/uL (ref 3.9–10.3)
lymph#: 1.8 10e3/uL (ref 0.9–3.3)

## 2011-07-15 LAB — CMP (CANCER CENTER ONLY)
ALT(SGPT): 31 U/L (ref 10–47)
AST: 23 U/L (ref 11–38)
Albumin: 4.1 g/dL (ref 3.3–5.5)
Alkaline Phosphatase: 69 U/L (ref 26–84)
BUN, Bld: 14 mg/dL (ref 7–22)
CO2: 29 meq/L (ref 18–33)
Calcium: 8.6 mg/dL (ref 8.0–10.3)
Chloride: 103 meq/L (ref 98–108)
Creat: 0.8 mg/dL (ref 0.6–1.2)
Glucose, Bld: 111 mg/dL (ref 73–118)
Potassium: 3.8 meq/L (ref 3.3–4.7)
Sodium: 143 meq/L (ref 128–145)
Total Bilirubin: 1.1 mg/dL (ref 0.20–1.60)
Total Protein: 6.8 g/dL (ref 6.4–8.1)

## 2011-07-15 LAB — CEA: CEA: <0.5 ng/mL (ref 0.0–5.0)

## 2011-07-15 MED ORDER — IOHEXOL 300 MG/ML  SOLN
100.0000 mL | Freq: Once | INTRAMUSCULAR | Status: AC | PRN
  Administered 2011-07-15: 100 mL via INTRAVENOUS

## 2011-07-15 NOTE — Progress Notes (Signed)
A with radiology called.  Pt. Is there for CT scan.  Reports she forgot she was to come to [facility] for labs at 9:15am.  Patient will come to [facility] after scans for lab work.  She is less than 57 years of age and can have scan performed per protocol.  Lab notified patient will be late.

## 2011-07-15 NOTE — Telephone Encounter (Signed)
receied note from thu that pt didn't understand the message i left. Called pt's home and s/w husband re appt for 1/15.

## 2011-07-17 ENCOUNTER — Ambulatory Visit: Payer: 59 | Admitting: Physician Assistant

## 2011-08-23 ENCOUNTER — Encounter: Payer: Self-pay | Admitting: *Deleted

## 2011-08-27 ENCOUNTER — Telehealth: Payer: Self-pay | Admitting: Hematology and Oncology

## 2011-08-27 ENCOUNTER — Ambulatory Visit (HOSPITAL_BASED_OUTPATIENT_CLINIC_OR_DEPARTMENT_OTHER): Payer: 59 | Admitting: Hematology and Oncology

## 2011-08-27 VITALS — BP 157/90 | HR 66 | Temp 97.5°F | Ht 67.0 in | Wt 200.7 lb

## 2011-08-27 DIAGNOSIS — I82409 Acute embolism and thrombosis of unspecified deep veins of unspecified lower extremity: Secondary | ICD-10-CM

## 2011-08-27 DIAGNOSIS — R9389 Abnormal findings on diagnostic imaging of other specified body structures: Secondary | ICD-10-CM

## 2011-08-27 DIAGNOSIS — C189 Malignant neoplasm of colon, unspecified: Secondary | ICD-10-CM

## 2011-08-27 NOTE — Progress Notes (Signed)
CC:   Clydie Braun L. Hal Hope, M.D. [name], M.D. [name] [name], MD  IDENTIFYING STATEMENT:  Patient is a 56 year old woman with colon cancer who presents for followup.  INTERVAL HISTORY:  Mrs. Orihuela was last seen 6 months ago.  She reports no change in bowel function.  She denies abdominal pain, nausea, vomiting.  She denies adenopathy.  She received a colonoscopy in October 2012 by Dr. [name].  I do not have those results, but the patient reports that they were unremarkable with plans for followup colonoscopy in 2 years time.  I reviewed recent CT scan of the chest, abdomen and pelvis obtained 07/15/2011.  The chest showed stable prevascular lymph nodes, and the lung parenchyma showed no new or suspicious pulmonary nodules.  The abdomen and pelvis showed no focal hepatic lesions with the gallbladder, pancreas, spleen and adrenal glands were unremarkable.  The small bowel and cecum were also normal.  There is no free fluid in the pelvis.  The patient was shown to be post hysterectomy, however a small cyst within the left ovary measuring 2 cm which is new was documented.  The right ovary was not identified.  The bone windows showed no aggressive osseous lesions.  MEDICATIONS:  Reviewed and updated.  PAST MEDICAL HISTORY/FAMILY HISTORY/SOCIAL HISTORY:  Unchanged.  REVIEW OF SYSTEMS:  A 10 point review of systems negative.  PHYSICAL EXAM:  The patient is a well-appearing, well-nourished woman in no distress.  Vitals:  Pulse 66, blood pressure 157/90, temperature 97.5, respirations 20, weight 200 pounds.  HEENT:  Head is atraumatic, normocephalic.  Sclerae anicteric.  Mouth is moist.  Chest:  Clear. Cardiovascular:  First and second heart sounds present.  No added sounds or murmurs.  Abdomen:  Soft, nontender.  Bowel sounds present. Extremities:  No calf tenderness.  No edema.  Lymph nodes:  No adenopathy.  CNS:  Nonfocal.  LAB DATA:  07/15/2011 white cell count 4.4,  hemoglobin 14.2, hematocrit 41.8, platelets 165.  Sodium 143, potassium 3.8, chloride 103, CO2 29, BUN 14, creatinine 0.8, glucose 111, t bilirubin 1.1, alkaline phosphatase 69, AST 23, ALT 31, calcium 8.6.  CEA less than 0.5. Results of CTs as in interval history.  IMPRESSION AND PLAN:  Mrs. Larkin is a 56 year old woman who is status post low anterior resection on August 03, 2006 for 6 cm mildly differentiated rectosigmoid adenocarcinoma.  None of 16 lymph nodes sampled had evidence of malignancy.  Chemotherapy was not recommended. Exam and lab work were unremarkable.  Colonoscopy in October was also unremarkable.  CT scan shows no evidence of recurrence, however it does New cyst in the right ovary.  Patient was convinced she had had a complete hysterectomy.  Discussed the CT findings with radiology and they confirmed the finding and suggested f/u with radiology studies in 5 months. I asked patient to follow up with her gynecologist.  Ms Auletta follows up in 6 months time with labs only.    ______________________________ Laurice Record, M.D. LIO/MEDQ  D:  08/27/2011  T:  08/27/2011  Job:  [id number]

## 2011-08-27 NOTE — Telephone Encounter (Signed)
appt made for lab 7/12 and md visit on 7/16 printed for pt     aom

## 2011-08-27 NOTE — Progress Notes (Signed)
This office note has been dictated.

## 2011-12-31 ENCOUNTER — Ambulatory Visit (INDEPENDENT_AMBULATORY_CARE_PROVIDER_SITE_OTHER): Payer: 59 | Admitting: Family Medicine

## 2011-12-31 DIAGNOSIS — R739 Hyperglycemia, unspecified: Secondary | ICD-10-CM

## 2011-12-31 DIAGNOSIS — I10 Essential (primary) hypertension: Secondary | ICD-10-CM

## 2011-12-31 DIAGNOSIS — L509 Urticaria, unspecified: Secondary | ICD-10-CM

## 2011-12-31 DIAGNOSIS — L309 Dermatitis, unspecified: Secondary | ICD-10-CM

## 2011-12-31 DIAGNOSIS — J309 Allergic rhinitis, unspecified: Secondary | ICD-10-CM

## 2011-12-31 DIAGNOSIS — R7309 Other abnormal glucose: Secondary | ICD-10-CM

## 2011-12-31 DIAGNOSIS — L259 Unspecified contact dermatitis, unspecified cause: Secondary | ICD-10-CM

## 2011-12-31 MED ORDER — TRIAMCINOLONE ACETONIDE 0.1 % EX CREA: TOPICAL_CREAM | Freq: Two times a day (BID) | CUTANEOUS | Status: DC

## 2011-12-31 MED ORDER — PREDNISONE 20 MG PO TABS: ORAL_TABLET | ORAL | Status: DC

## 2011-12-31 MED ORDER — LISINOPRIL 40 MG PO TABS: 40.0000 mg | ORAL_TABLET | Freq: Every day | ORAL | Status: DC

## 2011-12-31 NOTE — Progress Notes (Signed)
  Subjective:    Patient ID: [name], female    DOB: [birth year], 56 y.o.   MRN: [id number]  HPI Patient presents with itchy rash since Thursday. Rash very pruritic  Occurs 1-2 times a year  Works at [employer]. 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  HENT:       Boggy turbinates  Neck: Neck supple.  Cardiovascular: Normal rate, regular rhythm and normal heart sounds.   Pulmonary/Chest: Effort normal and breath sounds normal.  Neurological: She is alert.  Skin: Skin is warm.       Scattered urticaria superimposed on dry eczematous skin  Psychiatric: She has a normal mood and affect.    Results for orders placed in visit on 12/31/11  POCT GLYCOSYLATED HEMOGLOBIN (HGB A1C)      Component Value Range   Hemoglobin A1C 5.8    GLUCOSE, POCT (MANUAL RESULT ENTRY)      Component Value Range   POC Glucose 90  70 - 99 (mg/dl)        Assessment & Plan:  Urticaria Eczema HTN Elevated BS noted on prior labs  See medications on AVS Dry skin care reviewed Check A1C, BS Continue hypertensive medications

## 2012-01-16 ENCOUNTER — Telehealth: Payer: Self-pay

## 2012-01-16 NOTE — Telephone Encounter (Signed)
Patient dropped off forms to be filled out stating job conditions. Please call when ready for pick up. At [employer] now.

## 2012-01-17 NOTE — Telephone Encounter (Signed)
Form is at [employer].

## 2012-01-21 ENCOUNTER — Telehealth: Payer: Self-pay

## 2012-01-21 NOTE — Telephone Encounter (Signed)
The patient is requesting referral for ultrasound of legs and a pelvic US as well.  The patient stated that Dr. Hal Hope wanted her to have these done, but patient states she needs official referral.  The patient would like these tests to be on either 02/23/12 or 02/26/12 if possible.  The patient also would like form for work mailed to [address] [place], [place] [address] addressed to [facility] [employer].  This form is in the scan pile per Charlette Caffey at 104.

## 2012-01-22 NOTE — Telephone Encounter (Signed)
There isn't anything in Dr. Wendee Copp last note that would indicate that she needs ultrasound.  What symptoms is she having and we can try and troubleshooting it from there?  Please have them copy the form that needs to be scanned and mail it per patient's instructions.

## 2012-01-22 NOTE — Telephone Encounter (Signed)
1. Please pull this chart for a PA to review. If there is documentation on the report of the scan from January, we can order the follow-up scan.  2. Please call patient and verify if there is a form.  3. The OV with Dr. Hal Hope 12/31/2011 does not indicate that the patient had leg swelling. We may need to involve Dr. Hal Hope.

## 2012-01-22 NOTE — Telephone Encounter (Signed)
Pt called back and stated that she has pain and swelling in her legs and saw Dr Hal Hope about 2 weeks ago. She stated that she had a CT abd/pelvis back in Jan and this is follow up to that to check cyst. There was a slight language barrier, but we seemed to understand one another. Pt also said she was checking on some paper for her work that she dropped of last Thurs or Fri and wanted to know if it was ready. Dr Hal Hope wrote a letter and I printed it off and left for pt in drawer, but since she said she "dropped off" there may be some form also. Please ask pt if letter is all that is needed when we call pt back.

## 2012-01-22 NOTE — Telephone Encounter (Signed)
Left message to call back  

## 2012-01-23 NOTE — Telephone Encounter (Signed)
Female answered phone and stated pt is not home right now and requested we try back after an hour.

## 2012-01-23 NOTE — Telephone Encounter (Signed)
Pt's chart is at PA desk in phone messages [phone]). I confirmed that the form had been completed and Charlette Caffey mailed it to pt's employer and put copy in scan stack. Pt had been notified of this on evening of 01/21/12 and given a copy of the letter written by Dr Hal Hope (maybe due to language barrier she didn't understand that letter had been mailed when staff informed her?).

## 2012-01-23 NOTE — Telephone Encounter (Signed)
The oncologist actually suggested she have a pelvis exam and repeat CT scan at 6 months.  Have her come in so we can do a pelvic exam and check her ovary and we will set up her f/up scan.  We can also address her leg(s) issue then and set up any tests that are indicated.  She did not have a pelvic exam with Dr. Hal Hope in May.

## 2012-01-24 NOTE — Telephone Encounter (Signed)
Advised pt to RTC  Medical records if at all possible can you guys look through (at your convience)  the scan stack for this pt forms.  It looks like we mailed them to her employer and put a copy in the scan stack.  Pt would like a copy.

## 2012-01-24 NOTE — Telephone Encounter (Signed)
I looked in our scan pile and did not see any forms for patient.

## 2012-02-21 ENCOUNTER — Other Ambulatory Visit (HOSPITAL_BASED_OUTPATIENT_CLINIC_OR_DEPARTMENT_OTHER): Payer: 59

## 2012-02-21 DIAGNOSIS — C189 Malignant neoplasm of colon, unspecified: Secondary | ICD-10-CM

## 2012-02-21 DIAGNOSIS — I82409 Acute embolism and thrombosis of unspecified deep veins of unspecified lower extremity: Secondary | ICD-10-CM

## 2012-02-21 DIAGNOSIS — C19 Malignant neoplasm of rectosigmoid junction: Secondary | ICD-10-CM

## 2012-02-21 LAB — CBC WITH DIFFERENTIAL/PLATELET
BASO%: 1 % (ref 0.0–2.0)
EOS%: 2.6 % (ref 0.0–7.0)
HCT: 41.3 % (ref 34.8–46.6)
LYMPH%: 41.6 % (ref 14.0–49.7)
MCH: 31.3 pg (ref 25.1–34.0)
MCHC: 33.7 g/dL (ref 31.5–36.0)
MCV: 92.8 fL (ref 79.5–101.0)
MONO#: 0.3 10*3/uL (ref 0.1–0.9)
MONO%: 7.3 % (ref 0.0–14.0)
NEUT%: 47.5 % (ref 38.4–76.8)
Platelets: 160 10*3/uL (ref 145–400)
RBC: 4.45 10*6/uL (ref 3.70–5.45)
WBC: 4.5 10*3/uL (ref 3.9–10.3)

## 2012-02-21 LAB — COMPREHENSIVE METABOLIC PANEL
ALT: 27 U/L (ref 0–35)
Alkaline Phosphatase: 83 U/L (ref 39–117)
CO2: 27 mEq/L (ref 19–32)
Creatinine, Ser: 1 mg/dL (ref 0.50–1.10)
Sodium: 143 mEq/L (ref 135–145)
Total Bilirubin: 0.5 mg/dL (ref 0.3–1.2)
Total Protein: 6.6 g/dL (ref 6.0–8.3)

## 2012-02-21 LAB — CEA: CEA: <0.5 ng/mL (ref 0.0–5.0)

## 2012-02-25 ENCOUNTER — Telehealth: Payer: Self-pay | Admitting: Hematology and Oncology

## 2012-02-25 ENCOUNTER — Encounter: Payer: Self-pay | Admitting: Hematology and Oncology

## 2012-02-25 ENCOUNTER — Ambulatory Visit (HOSPITAL_BASED_OUTPATIENT_CLINIC_OR_DEPARTMENT_OTHER): Payer: 59 | Admitting: Hematology and Oncology

## 2012-02-25 VITALS — BP 176/94 | HR 65 | Temp 97.1°F | Ht 68.5 in | Wt 201.4 lb

## 2012-02-25 DIAGNOSIS — C189 Malignant neoplasm of colon, unspecified: Secondary | ICD-10-CM

## 2012-02-25 DIAGNOSIS — C19 Malignant neoplasm of rectosigmoid junction: Secondary | ICD-10-CM

## 2012-02-25 DIAGNOSIS — C186 Malignant neoplasm of descending colon: Secondary | ICD-10-CM | POA: Insufficient documentation

## 2012-02-25 NOTE — Progress Notes (Signed)
This office note has been dictated.

## 2012-02-25 NOTE — Progress Notes (Signed)
CC:   [name] L. Hal [name], M.D. [name], M.D. [name] [name], MD  IDENTIFYING STATEMENT:  Patient is a 56 year old woman with colon cancer who presents for followup.  INTERVAL HISTORY:  The patient was last seen 6 months ago.  Has had no major issues or concerns since last followup visit.  Denies change in bowel function.  Denies rectal bleeding.  Her weight is stable. Her last colonoscopy was performed October 2012.  MEDICATIONS:  Reviewed and updated.  ALLERGIES:  Reviewed.  PHYSICAL EXAM:  Patient is alert and oriented x3.  Vitals:  Pulse 65, blood pressure 176/94, temperature 97.1, respirations 20, weight 201.4 pounds.  HEENT:  Head is atraumatic, normocephalic.  Sclerae anicteric. Mouth moist.  Chest:  Clear.  CVS:  Unremarkable.  Abdomen:  Soft, nontender.  Extremities:  No edema.  LABORATORY DATA:  02/21/2012 white cell count 4.5, hemoglobin 13.9, hematocrit 41.3, platelets 160.  Sodium 143, potassium 4.1, chloride 106, CO2 27, BUN 16, creatinine 1, glucose 110.  T bilirubin 0.5, alkaline phosphatase 83, AST 20, ALT 27, calcium 9.2.  CEA is less than 0.5.  IMPRESSION AND PLAN:  Ms. [name] is a 56 year old woman who is status post low anterior resection on August 03, 2010 for 6 cm moderately differentiated rectosigmoid adenocarcinoma.  None of 16 lymph nodes sampled had evidence of malignancy.  Chemotherapy was not recommended.  Her current exam shows no evidence of recurrence.  Her lab work is stable.  With this said, she follows up in 6 months' time with labs and CTs.     ______________________________ Laurice Record, M.D. LIO/MEDQ  D:  02/25/2012  T:  02/25/2012  Job:  [id number]

## 2012-02-25 NOTE — Patient Instructions (Addendum)
Laressa Nunnery  [id number]  [facility] Discharge Instructions  RECOMMENDATIONS MADE BY THE CONSULTANT AND ANY TEST RESULTS WILL BE SENT TO YOUR REFERRING DOCTOR.   EXAM FINDINGS BY MD TODAY AND SIGNS AND SYMPTOMS TO REPORT TO CLINIC OR PRIMARY MD:   Your current list of medications are: Current Outpatient Prescriptions  Medication Sig Dispense Refill  . lisinopril (PRINIVIL,ZESTRIL) 40 MG tablet Take 1 tablet (40 mg total) by mouth daily.  90 tablet  2     INSTRUCTIONS GIVEN AND DISCUSSED:   SPECIAL INSTRUCTIONS/FOLLOW-UP:  See above.  I acknowledge that I have been informed and understand all the instructions given to me and received a copy. I do not have any more questions at this time, but understand that I may call the [facility] at [phone] during business hours should I have any further questions or need assistance in obtaining follow-up care.

## 2012-02-25 NOTE — Telephone Encounter (Signed)
gv pt appt schedule for January 2014 including ct for 08/26/12.

## 2012-06-05 ENCOUNTER — Telehealth: Payer: Self-pay | Admitting: Radiology

## 2012-06-05 ENCOUNTER — Ambulatory Visit (INDEPENDENT_AMBULATORY_CARE_PROVIDER_SITE_OTHER): Payer: 59 | Admitting: Family Medicine

## 2012-06-05 ENCOUNTER — Ambulatory Visit: Payer: 59

## 2012-06-05 VITALS — BP 170/81 | HR 69 | Temp 98.2°F | Resp 17 | Ht 67.5 in | Wt 199.0 lb

## 2012-06-05 DIAGNOSIS — R109 Unspecified abdominal pain: Secondary | ICD-10-CM

## 2012-06-05 DIAGNOSIS — M7989 Other specified soft tissue disorders: Secondary | ICD-10-CM

## 2012-06-05 DIAGNOSIS — I10 Essential (primary) hypertension: Secondary | ICD-10-CM

## 2012-06-05 LAB — POCT CBC
Granulocyte percent: 45 %G (ref 37–80)
MCH, POC: 29.5 pg (ref 27–31.2)
MCV: 95.4 fL (ref 80–97)
MID (cbc): 0.3 (ref 0–0.9)
MPV: 9 fL (ref 0–99.8)
POC LYMPH PERCENT: 48.7 %L (ref 10–50)
POC MID %: 6.3 %M (ref 0–12)
Platelet Count, POC: 192 10*3/uL (ref 142–424)
RDW, POC: 13.9 %

## 2012-06-05 LAB — POCT UA - MICROSCOPIC ONLY
Casts, Ur, LPF, POC: NEGATIVE
Crystals, Ur, HPF, POC: NEGATIVE
Mucus, UA: NEGATIVE

## 2012-06-05 LAB — POCT URINALYSIS DIPSTICK
Blood, UA: NEGATIVE
Protein, UA: NEGATIVE
Spec Grav, UA: <=1.005
Urobilinogen, UA: 0.2
pH, UA: 6

## 2012-06-05 MED ORDER — HYDROCHLOROTHIAZIDE 12.5 MG PO CAPS: 12.5000 mg | ORAL_CAPSULE | Freq: Every day | ORAL | Status: DC

## 2012-06-05 NOTE — Telephone Encounter (Signed)
Patient needs a STAT CT Abd and Pelvis with contrast, Dr Patsy Lager wants it done on Sat. Patient aware we will cal to set this up. Will you please work on this as soon as possible on Saturday am? Amy

## 2012-06-05 NOTE — Progress Notes (Addendum)
[facility] [address] [place] [address] [phone]  Date:  06/05/2012   Name:  [name]   DOB:  [birth year]   MRN:  [id number]  PCP:  Doristine Section, MD    Chief Complaint: Abdominal Pain and Leg Pain   History of Present Illness:  [name] is a 56 y.o. very pleasant female patient who presents with the following:  She is here to evaluate right sided abdominal pain of 2 or 3 months duration.  It was just occasional at first but is now more persistent- she has noted pain nearly constantly for the last 2 or 3 weeks.   She has not had anorexia- is still able to eat normally.  Eating does not change her symptoms.   No fever. No urinary sympoms  She does have a history of colon cancer and had a partial bowl resection in December of 2011- her last colonoscopy was done in 05/2011. She had a follow-up CT done 07/2011 which did not show any recurrence, but did show a LEFT ovarian cyst.  I called the radiologist and she did still have her appendix at that time so it was not removed during her operation.    No nausea or vomiting.  No diarrhea  She also notes pain and swelling of both legs for several years.  It does seem to be worse in the evening. She notes that she has had ?a blood clot in her ankle in the past.  Her thighs also tend to ache at night  She has had a hysterectomy and cholecystectomy by history but ?gallbladder seen on CT scan.    She also notes that her BP is still high despite treatment with lisinopril.  Her CP on 02/25/12 was 176/ 94.  She wonders if she needs to change her BP medication    Patient Active Problem List  Diagnosis  . DVT (deep venous thrombosis)  . Colon cancer    Past Medical History  Diagnosis Date  . Hypertension   . Leg DVT (deep venous thromboembolism), acute   . Uterine fibroid   . Cancer 08/03/2010    Past Surgical History  Procedure Date  . Gallbladder surgery   . Colon surgery   .  Cholecystectomy   . Abdominal hysterectomy     History  Substance Use Topics  . Smoking status: Never Smoker   . Smokeless tobacco: Never Used  . Alcohol Use: No    No family history on file.  No Known Allergies  Medication list has been reviewed and updated.  Current Outpatient Prescriptions on File Prior to Visit  Medication Sig Dispense Refill  . lisinopril (PRINIVIL,ZESTRIL) 40 MG tablet Take 1 tablet (40 mg total) by mouth daily.  90 tablet  2    Review of Systems:  As per HPI- otherwise negative.   Physical Examination: Filed Vitals:   06/05/12 1624  BP: 170/81  Pulse: 69  Temp: 98.2 F (36.8 C)  Resp: 17   Filed Vitals:   06/05/12 1624  Height: 5' 7.5" (1.715 m)  Weight: 199 lb (90.266 kg)   Body mass index is 30.71 kg/(m^2). Ideal Body Weight: Weight in (lb) to have BMI = 25: 161.7    GEN: WDWN, NAD, Non-toxic, A & O x 3, overweight HEENT: Atraumatic, Normocephalic. Neck supple. No masses, No LAD. Ears and Nose: No external deformity. CV: RRR, No M/G/R. No JVD. No thrill. No extra heart sounds. PULM: CTA B, no wheezes, crackles, rhonchi.  No retractions. No resp. distress. No accessory muscle use. ABD: S,  ND, +BS. No rebound. No HSM. No guarding. She does have mild tenderness in her RLQ- it may be related to her operative scars ? Adhesions GU: normal external genitalia.  Performed bimanual exam- normal, no pain EXTR: No c/c/e- no apparent swelling of her feet or ankles.  Her calves are not swollen or tender NEURO Normal gait.  PSYCH: Normally interactive. Conversant. Not depressed or anxious appearing.  Calm demeanor.   Results for orders placed in visit on 06/05/12  POCT CBC      Component Value Range   WBC 5.4  4.6 - 10.2 K/uL   Lymph, poc 2.6  0.6 - 3.4   POC LYMPH PERCENT 48.7  10 - 50 %L   MID (cbc) 0.3  0 - 0.9   POC MID % 6.3  0 - 12 %M   POC Granulocyte 2.4  2 - 6.9   Granulocyte percent 45.0  37 - 80 %G   RBC 4.68  4.04 - 5.48 M/uL    Hemoglobin 13.8  12.2 - 16.2 g/dL   HCT, POC 16.1  09.6 - 47.9 %   MCV 95.4  80 - 97 fL   MCH, POC 29.5  27 - 31.2 pg   MCHC 30.9 (*) 31.8 - 35.4 g/dL   RDW, POC 04.5     Platelet Count, POC 192  142 - 424 K/uL   MPV 9.0  0 - 99.8 fL  POCT UA - MICROSCOPIC ONLY      Component Value Range   WBC, Ur, HPF, POC 1-3     RBC, urine, microscopic negative     Bacteria, U Microscopic 1+     Mucus, UA negative     Epithelial cells, urine per micros 0-1     Crystals, Ur, HPF, POC negative     Casts, Ur, LPF, POC negative     Yeast, UA negative    POCT URINALYSIS DIPSTICK      Component Value Range   Color, UA yellow     Clarity, UA clear     Glucose, UA negative     Bilirubin, UA negative     Ketones, UA negative     Spec Grav, UA <=1.005     Blood, UA negative     pH, UA 6.0     Protein, UA negative     Urobilinogen, UA 0.2     Nitrite, UA negative     Leukocytes, UA Trace     [facility] reading (PRIMARY) by  Dr. Patsy Lager.  Negative abdominal series ACUTE ABDOMEN SERIES (ABDOMEN 2 VIEW & CHEST 1 VIEW)  Comparison: None.  Findings: The heart size and mediastinal contours are normal. The lungs are clear. There is no pleural effusion or pneumothorax.  The bowel gas pattern is normal. There is no free intraperitoneal air. There are no suspicious abdominal calcifications. Osseous structures appear unremarkable.  IMPRESSION: No active cardiopulmonary or abdominal process identified.   Assessment and Plan:  [name] is here today with a few issues.  Her abdominal pain has been present for a few months, although it has been worse for a coupleof weeks. Nevertheless, her history, labs and physical exam do not suggest acute appendicitis. We plan to do a CT scan tomorrow morning to evaluate for possible causes of her pain and to follow- up her history of colon cancer.  If she gets worse during the night she will call or go to  the ED.    Will add HCTZ for better control of her BP as well as to  help with any LE swelling.    Will follow- up tomorrow after her CT scan  [name], MD  Called 10/26 once CT result received;  CT ABDOMEN AND PELVIS WITH CONTRAST  Technique: Multidetector CT imaging of the abdomen and pelvis was performed following the standard protocol during bolus administration of intravenous contrast.  Contrast: OMNIPAQUE IOHEXOL 300 MG/ML SOLN  Comparison: 07/15/2011 and prior CTs  Findings: The liver, spleen, adrenal glands, pancreas, and kidneys are unremarkable except for mild bilateral renal cortical thinning. The patient is status post hysterectomy and cholecystectomy.  No free fluid, enlarged lymph nodes, biliary dilation or abdominal aortic aneurysm identified.  Surgical changes/anastomosis at the rectosigmoid region is noted. No other bowel abnormalities are identified. The appendix and bladder are unremarkable.  No acute or suspicious bony abnormalities are identified.  IMPRESSION: No evidence of acute abnormality or metastatic disease.   CT looks fine- called and let her know.  Explained that we do not have a definite reason for her pain, but I do suspect adhesions. Asked her to come in for a recheck on one week.  We also need to recheck her leg pain and BP.  She is not sure if she can do this but took it under advisement.  Sent a message to Dr. Dalene Carrow regarding the CT

## 2012-06-06 ENCOUNTER — Ambulatory Visit (HOSPITAL_COMMUNITY)
Admission: RE | Admit: 2012-06-06 | Discharge: 2012-06-06 | Disposition: A | Discharge status: Home / Self Care | Payer: 59 | Source: Ambulatory Visit | Attending: Family Medicine | Admitting: Family Medicine

## 2012-06-06 DIAGNOSIS — R109 Unspecified abdominal pain: Secondary | ICD-10-CM | POA: Insufficient documentation

## 2012-06-06 DIAGNOSIS — M7989 Other specified soft tissue disorders: Secondary | ICD-10-CM

## 2012-06-06 DIAGNOSIS — Z85038 Personal history of other malignant neoplasm of large intestine: Secondary | ICD-10-CM | POA: Insufficient documentation

## 2012-06-06 LAB — COMPREHENSIVE METABOLIC PANEL
ALT: 18 U/L (ref 0–35)
AST: 19 U/L (ref 0–37)
Alkaline Phosphatase: 81 U/L (ref 39–117)
BUN: 16 mg/dL (ref 6–23)
Creat: 0.87 mg/dL (ref 0.50–1.10)

## 2012-06-06 MED ORDER — IOHEXOL 300 MG/ML  SOLN
100.0000 mL | Freq: Once | INTRAMUSCULAR | Status: AC | PRN
  Administered 2012-06-06: 100 mL via INTRAVENOUS

## 2012-06-06 NOTE — Telephone Encounter (Signed)
Called [place] to to setup CT scan. Amber at [facility] pt can come over now. Called pt to advise her to go to [facility] to get scan. Pt understood.

## 2012-06-06 NOTE — Addendum Note (Signed)
Addended by: Cydney Ok on: 06/06/2012 10:24 AM   Modules accepted: Orders

## 2012-06-07 LAB — URINE CULTURE

## 2012-08-26 ENCOUNTER — Ambulatory Visit (HOSPITAL_COMMUNITY)
Admission: RE | Admit: 2012-08-26 | Discharge: 2012-08-26 | Disposition: A | Discharge status: Home / Self Care | Payer: 59 | Source: Ambulatory Visit | Attending: Hematology and Oncology | Admitting: Hematology and Oncology

## 2012-08-26 ENCOUNTER — Other Ambulatory Visit (HOSPITAL_BASED_OUTPATIENT_CLINIC_OR_DEPARTMENT_OTHER): Payer: 59

## 2012-08-26 ENCOUNTER — Other Ambulatory Visit: Payer: Self-pay | Admitting: Family

## 2012-08-26 DIAGNOSIS — Z90710 Acquired absence of both cervix and uterus: Secondary | ICD-10-CM | POA: Insufficient documentation

## 2012-08-26 DIAGNOSIS — C187 Malignant neoplasm of sigmoid colon: Secondary | ICD-10-CM

## 2012-08-26 DIAGNOSIS — I10 Essential (primary) hypertension: Secondary | ICD-10-CM | POA: Insufficient documentation

## 2012-08-26 DIAGNOSIS — Z9049 Acquired absence of other specified parts of digestive tract: Secondary | ICD-10-CM | POA: Insufficient documentation

## 2012-08-26 DIAGNOSIS — I517 Cardiomegaly: Secondary | ICD-10-CM | POA: Insufficient documentation

## 2012-08-26 DIAGNOSIS — C189 Malignant neoplasm of colon, unspecified: Secondary | ICD-10-CM | POA: Insufficient documentation

## 2012-08-26 DIAGNOSIS — Z86718 Personal history of other venous thrombosis and embolism: Secondary | ICD-10-CM | POA: Insufficient documentation

## 2012-08-26 DIAGNOSIS — Z9089 Acquired absence of other organs: Secondary | ICD-10-CM | POA: Insufficient documentation

## 2012-08-26 DIAGNOSIS — K449 Diaphragmatic hernia without obstruction or gangrene: Secondary | ICD-10-CM | POA: Insufficient documentation

## 2012-08-26 LAB — COMPREHENSIVE METABOLIC PANEL (CC13)
AST: 26 U/L (ref 5–34)
Albumin: 3.8 g/dL (ref 3.5–5.0)
Alkaline Phosphatase: 94 U/L (ref 40–150)
BUN: 14 mg/dL (ref 7.0–26.0)
Potassium: 3.7 mEq/L (ref 3.5–5.1)
Sodium: 145 mEq/L (ref 136–145)
Total Bilirubin: 0.79 mg/dL (ref 0.20–1.20)
Total Protein: 6.9 g/dL (ref 6.4–8.3)

## 2012-08-26 LAB — CBC WITH DIFFERENTIAL/PLATELET
Basophils Absolute: 0 10*3/uL (ref 0.0–0.1)
Eosinophils Absolute: 0.1 10*3/uL (ref 0.0–0.5)
HCT: 39.6 % (ref 34.8–46.6)
HGB: 13.7 g/dL (ref 11.6–15.9)
LYMPH%: 42.2 % (ref 14.0–49.7)
MONO#: 0.4 10*3/uL (ref 0.1–0.9)
NEUT#: 2.1 10*3/uL (ref 1.5–6.5)
Platelets: 162 10*3/uL (ref 145–400)
RBC: 4.41 10*6/uL (ref 3.70–5.45)
WBC: 4.4 10*3/uL (ref 3.9–10.3)

## 2012-08-26 MED ORDER — IOHEXOL 300 MG/ML  SOLN
100.0000 mL | Freq: Once | INTRAMUSCULAR | Status: AC | PRN
  Administered 2012-08-26: 100 mL via INTRAVENOUS

## 2012-08-28 ENCOUNTER — Telehealth: Payer: Self-pay

## 2012-08-28 ENCOUNTER — Telehealth: Payer: Self-pay | Admitting: Oncology

## 2012-08-28 ENCOUNTER — Encounter: Payer: Self-pay | Admitting: Family

## 2012-08-28 ENCOUNTER — Ambulatory Visit (HOSPITAL_BASED_OUTPATIENT_CLINIC_OR_DEPARTMENT_OTHER): Payer: 59 | Admitting: Family

## 2012-08-28 VITALS — BP 175/84 | HR 69 | Temp 97.5°F | Resp 20 | Ht 67.5 in | Wt 200.9 lb

## 2012-08-28 DIAGNOSIS — C189 Malignant neoplasm of colon, unspecified: Secondary | ICD-10-CM

## 2012-08-28 DIAGNOSIS — C19 Malignant neoplasm of rectosigmoid junction: Secondary | ICD-10-CM

## 2012-08-28 NOTE — Patient Instructions (Addendum)
Please contact us at [phone] if you have any questions or concerns. 

## 2012-08-28 NOTE — Telephone Encounter (Signed)
gv and printed appt schedule for pt for July....gv pt Barium..the patient aware central scheduling will contact with d/t for Ct

## 2012-08-28 NOTE — Progress Notes (Signed)
Patient ID: [name], female   DOB: September 17, 1955, 57 y.o.   MRN: [id number] CSN: [id number]  CC: Kendra Found. Copland, MD Kendra Miyamoto, MD Kendra Hawks. [name], MD   Problem List: [name] is a 57 y.o. Caucasian female with a problem list consisting of:  1.  Status post low anterior resection/partial colectomy on 08/03/2010 for a 6 cm mildly differentiated rectosigmoid adenocarcinoma. None of 16 lymph nodes sampled had evidence of malignancy. Chemotherapy was not recommended.  Her last colonoscopy was on 05/30/2011 and showed an anastomosis at 7 cm from the anal verge and large hemorrhoids.  Repeat colonoscopy in 3 years. 2.  HTN 3.  Left lower extremity DVT (01/2004) s/p Coumadin therapy 4.  Status post TAH with a history of uterine fibroids 5.  Mammogram in 2012 - She states her employer offers mammograms in October every year and she will get a mammogram in October 2014.    [name] was seen today for follow up of 6 cm moderately differentiated rectosigmoid adenocarcinoma diagnosed in 06/2010.  She is status post low anterior resection/partial colectomy on 08/03/2010. [name] is transitioning her care from Dr. Dalene Carrow to Dr. Arline Asp today. [name] was last seen by Dr. Dalene Carrow on 07/016/2013.  Since her last office visit, [name] states that she has been doing well.  She does report that she had some abdominal discomfort in 05/2012, but the pain has resolved.  [name]'s blood pressure was elevated today at 175/84.  She states that she had not taken her BP medication.  Elevated blood pressure was reported to April, at Dr. Durel Salts office.   [name] denies any other symptomatology during today's visit including any unusual bleeding, bruising, night sweats, fever, chills, headaches,  N/V/D or constipation.  [name] is physically active and has a good appetite.  She works as an Human resources officer at [employer].    Past Medical  History: Past Medical History  Diagnosis Date  . Hypertension   . Leg DVT (deep venous thromboembolism), acute   . Uterine fibroid   . Cancer 08/03/2010    Surgical History: Past Surgical History  Procedure Date  . Gallbladder surgery   . Colon surgery   . Cholecystectomy   . Abdominal hysterectomy     Current Medications: Current Outpatient Prescriptions  Medication Sig Dispense Refill  . hydrochlorothiazide (MICROZIDE) 12.5 MG capsule Take 1 capsule (12.5 mg total) by mouth daily.  30 capsule  5  . lisinopril (PRINIVIL,ZESTRIL) 40 MG tablet Take 1 tablet (40 mg total) by mouth daily.  90 tablet  2    Allergies: No Known Allergies  Family History: Family History  Problem Relation Age of Onset  . Hypertension Father     Social History: History  Substance Use Topics  . Smoking status: Never Smoker   . Smokeless tobacco: Never Used  . Alcohol Use: No    Review of Systems: 10 Point review of systems was completed and is negative except as noted above.   Physical Exam:   Blood pressure 175/84, pulse 69, temperature 97.5 F (36.4 C), temperature source Oral, resp. rate 20, height 5' 7.5" (1.715 m), weight 200 lb 14.4 oz (91.128 kg).  General appearance: Alert, cooperative, well nourished, no apparent distress Head: Normocephalic, without obvious abnormality, atraumatic Eyes: Conjunctivae/corneas clear, PERRLA, EOMI Nose: Nares, septum and mucosa are normal, no drainage or sinus tenderness. Neck: No adenopathy, supple, symmetrical, trachea midline, thyroid not enlarged, no tenderness Resp: Clear to auscultation bilaterally Cardio:  Regular rate and rhythm, S1, S2 normal, no murmur, click, rub or gallop GI: Soft, distended, non-tender, hypoactive bowel sounds, no organomegaly Extremities: Extremities normal, atraumatic, no cyanosis or edema, no signs of DVT Lymph nodes: Cervical, supraclavicular, and axillary nodes normal Neurologic: Grossly normal    Laboratory  Data: Recent Results (from the past 336 hour(s))  CBC WITH DIFFERENTIAL   Collection Time   08/26/12  9:05 AM      Component Value Range   WBC 4.4  3.9 - 10.3 10e3/uL   NEUT# 2.1  1.5 - 6.5 10e3/uL   HGB 13.7  11.6 - 15.9 g/dL   HCT 45.4  09.8 - 11.9 %   Platelets 162  145 - 400 10e3/uL   MCV 89.7  79.5 - 101.0 fL   MCH 31.1  25.1 - 34.0 pg   MCHC 34.7  31.5 - 36.0 g/dL   RBC 1.47  8.29 - 5.62 10e6/uL   RDW 13.0  11.2 - 14.5 %   lymph# 1.9  0.9 - 3.3 10e3/uL   MONO# 0.4  0.1 - 0.9 10e3/uL   Eosinophils Absolute 0.1  0.0 - 0.5 10e3/uL   Basophils Absolute 0.0  0.0 - 0.1 10e3/uL   NEUT% 46.3  38.4 - 76.8 %   LYMPH% 42.2  14.0 - 49.7 %   MONO% 8.5  0.0 - 14.0 %   EOS% 2.1  0.0 - 7.0 %   BASO% 0.9  0.0 - 2.0 %  COMPREHENSIVE METABOLIC PANEL (CC13)   Collection Time   08/26/12  9:05 AM      Component Value Range   Sodium 145  136 - 145 mEq/L   Potassium 3.7  3.5 - 5.1 mEq/L   Chloride 106  98 - 107 mEq/L   CO2 29  22 - 29 mEq/L   Glucose 125 (*) 70 - 99 mg/dl   BUN 13.0  7.0 - 86.5 mg/dL   Creatinine 0.9  0.6 - 1.1 mg/dL   Total Bilirubin 7.84  0.20 - 1.20 mg/dL   Alkaline Phosphatase 94  40 - 150 U/L   AST 26  5 - 34 U/L   ALT 23  0 - 55 U/L   Total Protein 6.9  6.4 - 8.3 g/dL   Albumin 3.8  3.5 - 5.0 g/dL   Calcium 9.1  8.4 - 69.6 mg/dL     Imaging Studies: 1.  CT of chest with contrast on 08/26/2012 showed lung windows demonstrate mild motion degradation throughout.  Given this factor, no nodules or airspace opacities.  Soft tissue windows demonstrate no supraclavicular adenopathy. Similar calcified right thyroid nodule.  Mildly tortuous descending thoracic aorta.  Mild cardiomegaly, without pericardial or pleural effusion. No central pulmonary embolism, on this non-dedicated study.  No middle mediastinal or hilar adenopathy.  Tiny hiatal hernia.  Mildly prominent prevascular nodes.  Similar index 1.0 cm node on image 25/series 2.  IMPRESSION:  1. No acute process or  evidence of metastatic disease in the chest. 2.  Similar prominent prevascular nodes since 07/15/2011, most likely reactive. 3.  Cardiomegaly. 4.  Small hiatal hernia. 5.  Mild motion degradation.    2.  CT of abdomen/pelvis with contrast on 08/26/2012 showed motion degradation continues into the abdomen and pelvis.  Given this factor, normal liver, spleen, distal stomach, pancreas. Cholecystectomy without biliary ductal dilatation. Normal adrenal glands and kidneys. No retroperitoneal or retrocrural adenopathy.  Surgical sutures at the rectosigmoid junction.  Similar borderline rectal wall thickening on image 114/series  2, favored to be due to underdistension. Normal terminal ileum and appendix.  Small bowel normal in caliber, without ascites.  There is increased density within the jejunal mesenteric fat with small nodes within.  Example image 76/series 2.  Similar, including back to 07/15/2011.  No evidence of omental or peritoneal disease.  No pelvic adenopathy.    Normal urinary bladder.  Hysterectomy.  No adnexal mass.  No significant free fluid.  Bone island in the right femoral head.  IMPRESSION:  1. No acute process or evidence of metastatic disease in the abdomen or pelvis. 2.  Mild motion degradation. 3.  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      Impression/Plan: [name] is a 57 year-old Caucasian female who is status post low anterior resection/partial colectomy on 08/03/2010 for a 6 cm mildly differentiated rectosigmoid adenocarcinoma. None of the 16 lymph nodes sampled had evidence of malignancy. Chemotherapy was not recommended.  Her last colonoscopy was on 05/30/2011 and showed an anastomosis at 7 cm from the anal verge and large hemorrhoids. She is doing well without any evidence of cancer recurrence.   We plan to see [name] in 6 months (03/10/2013) for CT scans of the chest and abdomen/pelvis with contrast and  we will check  labs (CMP, CBC, LDH and CEA) on 03/11/2013 the day of her office visit with Dr. Arline Asp. [name] was encouraged to contact us in the interim if she has any questions or concerns.   Extensive records review and requests were completed in preparation for the transitioning of Ms. Schecter's  care to our service. Thirty minutes was spent face to face with this patient. This patient was discussed with Dr. Arline Asp.   Larina Bras, NP-C 08/28/2012, 10:42 AM

## 2012-08-28 NOTE — Telephone Encounter (Signed)
Dr Patsy Lager   Patient was seen at [facility] and they Annice Pih) reports her blood pressure is 170/90.  Just wanted to make you aware.

## 2012-09-01 ENCOUNTER — Telehealth: Payer: Self-pay | Admitting: Family Medicine

## 2012-09-06 NOTE — Telephone Encounter (Signed)
Have called a few times- not able to reach [name], I suspect her husband has answered the phone and hung up due to language barrier.  Will send a letter to her

## 2012-09-26 ENCOUNTER — Other Ambulatory Visit: Payer: Self-pay

## 2012-10-01 ENCOUNTER — Ambulatory Visit (INDEPENDENT_AMBULATORY_CARE_PROVIDER_SITE_OTHER): Payer: 59 | Admitting: Family Medicine

## 2012-10-01 VITALS — BP 176/80 | HR 67 | Temp 98.1°F | Resp 16 | Ht 67.0 in | Wt 201.0 lb

## 2012-10-01 DIAGNOSIS — I10 Essential (primary) hypertension: Secondary | ICD-10-CM

## 2012-10-01 DIAGNOSIS — I83893 Varicose veins of bilateral lower extremities with other complications: Secondary | ICD-10-CM

## 2012-10-01 MED ORDER — HYDROCHLOROTHIAZIDE 25 MG PO TABS: 25.0000 mg | ORAL_TABLET | Freq: Every day | ORAL | Status: DC

## 2012-10-01 NOTE — Progress Notes (Signed)
[name] is a 57 y.o. female who presents to Urgent Care today with complaints of high blood pressure:  1.   Hypertension:  Long-term problem for this patient.  No adverse effects from medication.  No HA, CP, dizziness, shortness of breath, palpitations, or LE swelling.  She checks her blood pressure at home and it runs 150s systolic.   BP Readings from Last 3 Encounters:  10/01/12 176/80  08/28/12 175/84  06/05/12 170/81      2.  Vein specialist:  PMH reviewed.  Past Medical History  Diagnosis Date  . Hypertension   . Leg DVT (deep venous thromboembolism), acute   . Uterine fibroid   . Cancer 08/03/2010   Past Surgical History  Procedure Laterality Date  . Gallbladder surgery    . Colon surgery    . Cholecystectomy    . Abdominal hysterectomy      Medications reviewed. Current Outpatient Prescriptions  Medication Sig Dispense Refill  . hydrochlorothiazide (MICROZIDE) 12.5 MG capsule Take 1 capsule (12.5 mg total) by mouth daily.  30 capsule  5  . lisinopril (PRINIVIL,ZESTRIL) 40 MG tablet Take 1 tablet (40 mg total) by mouth daily.  90 tablet  2   No current facility-administered medications for this visit.    ROS as above otherwise neg.  No chest pain, palpitations, SOB, Fever, Chills, Abd pain, N/V/D.   Physical Exam:  BP 176/80  Pulse 67  Temp(Src) 98.1 F (36.7 C) (Oral)  Resp 16  Ht 5\' 7"  (1.702 m)  Wt 201 lb (91.173 kg)  BMI 31.47 kg/m2  SpO2 98% Gen:  Alert, cooperative patient who appears stated age in no acute distress.  Vital signs reviewed. HEENT: EOMI,  MMM Pulm:  Clear to auscultation bilaterally with good air movement.  No wheezes or rales noted.   Cardiac:  Regular rate and rhythm without murmur auscultated.  Good S1/S2. Abd:  Soft/nondistended/nontender.  Good bowel sounds throughout all four quadrants.  No masses noted.  Exts: Non edematous BL  LE, warm and well perfused.   Assessment and Plan:

## 2012-10-01 NOTE — Progress Notes (Signed)
I have read, reviewed, and agree with plan of care by Dr. Walden  

## 2012-10-01 NOTE — Patient Instructions (Signed)
Increase the Hydrochlorothiazide to 2 pills a day.  Do not change the Lisinopri.  Come back for a blood pressure recheck in 4 - 6 weeks.

## 2013-03-10 ENCOUNTER — Ambulatory Visit (HOSPITAL_COMMUNITY)
Admission: RE | Admit: 2013-03-10 | Discharge: 2013-03-10 | Disposition: A | Discharge status: Home / Self Care | Payer: 59 | Source: Ambulatory Visit | Attending: Family | Admitting: Family

## 2013-03-10 DIAGNOSIS — C189 Malignant neoplasm of colon, unspecified: Secondary | ICD-10-CM

## 2013-03-10 DIAGNOSIS — Z85038 Personal history of other malignant neoplasm of large intestine: Secondary | ICD-10-CM | POA: Insufficient documentation

## 2013-03-10 DIAGNOSIS — Z09 Encounter for follow-up examination after completed treatment for conditions other than malignant neoplasm: Secondary | ICD-10-CM | POA: Insufficient documentation

## 2013-03-10 MED ORDER — IOHEXOL 300 MG/ML  SOLN
100.0000 mL | Freq: Once | INTRAMUSCULAR | Status: AC | PRN
  Administered 2013-03-10: 100 mL via INTRAVENOUS

## 2013-03-11 ENCOUNTER — Ambulatory Visit (HOSPITAL_BASED_OUTPATIENT_CLINIC_OR_DEPARTMENT_OTHER): Payer: 59 | Admitting: Hematology and Oncology

## 2013-03-11 ENCOUNTER — Telehealth: Payer: Self-pay | Admitting: Hematology and Oncology

## 2013-03-11 ENCOUNTER — Other Ambulatory Visit (HOSPITAL_BASED_OUTPATIENT_CLINIC_OR_DEPARTMENT_OTHER): Payer: 59 | Admitting: Lab

## 2013-03-11 VITALS — BP 161/82 | HR 70 | Temp 98.2°F | Resp 20 | Ht 67.0 in | Wt 198.8 lb

## 2013-03-11 DIAGNOSIS — C189 Malignant neoplasm of colon, unspecified: Secondary | ICD-10-CM

## 2013-03-11 LAB — CBC WITH DIFFERENTIAL/PLATELET
BASO%: 1 % (ref 0.0–2.0)
EOS%: 1.9 % (ref 0.0–7.0)
HCT: 40.6 % (ref 34.8–46.6)
LYMPH%: 41.3 % (ref 14.0–49.7)
MCH: 30.6 pg (ref 25.1–34.0)
MCHC: 33.9 g/dL (ref 31.5–36.0)
MCV: 90.1 fL (ref 79.5–101.0)
MONO%: 6 % (ref 0.0–14.0)
NEUT%: 49.8 % (ref 38.4–76.8)
Platelets: 174 10*3/uL (ref 145–400)
RBC: 4.5 10*6/uL (ref 3.70–5.45)
WBC: 4.7 10*3/uL (ref 3.9–10.3)

## 2013-03-11 LAB — COMPREHENSIVE METABOLIC PANEL (CC13)
AST: 23 U/L (ref 5–34)
Albumin: 4.2 g/dL (ref 3.5–5.0)
Alkaline Phosphatase: 88 U/L (ref 40–150)
BUN: 14.5 mg/dL (ref 7.0–26.0)
Creatinine: 1 mg/dL (ref 0.6–1.1)
Glucose: 126 mg/dl (ref 70–140)
Potassium: 3.6 mEq/L (ref 3.5–5.1)
Total Bilirubin: 0.54 mg/dL (ref 0.20–1.20)

## 2013-03-11 NOTE — Telephone Encounter (Signed)
Gave pt appt for lab and MD on October 2014 °

## 2013-03-11 NOTE — Progress Notes (Signed)
Patient ID: [name], female   DOB: 07-03-1956, 57 y.o.   MRN: [id number] CSN: [id number]  CC: Jessica C. Copland, MD [name], MD [name]. [name], MD   Problem List: [name] is a 57 y.o. Caucasian female with a problem list consisting of:  1.  Status post low anterior resection/partial colectomy on 08/03/2010 for a 6 cm mildly differentiated rectosigmoid adenocarcinoma. None of 16 lymph nodes sampled had evidence of malignancy. Chemotherapy was not recommended.  Her last colonoscopy was on 05/30/2011 and showed an anastomosis at 7 cm from the anal verge and large hemorrhoids.  Repeat colonoscopy in 3 years. 2.  HTN 3.  Left lower extremity DVT (01/2004) s/p Coumadin therapy 4.  Status post TAH with a history of uterine fibroids 5.  Mammogram in 2012 - She states her employer offers mammograms in October every year and she will get a mammogram in October 2014.    [name] was seen today for follow up of 6 cm moderately differentiated rectosigmoid adenocarcinoma diagnosed in 06/2010.  She is status post low anterior resection/partial colectomy on 08/03/2010. [name] is used to be seen by Dr. Dalene Carrow. Since her last office visit, Ms. Alsip states that she has been doing well.  She does report that she had some abdominal discomfort in 05/2012, but the pain has resolved.  Ms. Bendickson's blood pressure was elevated today at 161/82.    Ms. Staron denies any other symptomatology during today's visit including any unusual bleeding, bruising, night sweats, fever, chills, headaches,  N/V/D or constipation.  Ms. Rasch is physically active and has a good appetite.  She works as an Human resources officer at [employer].    Past Medical History: Past Medical History  Diagnosis Date  . Hypertension   . Leg DVT (deep venous thromboembolism), acute   . Uterine fibroid   . Cancer 08/03/2010    Surgical History: Past Surgical History  Procedure  Laterality Date  . Gallbladder surgery    . Colon surgery    . Cholecystectomy    . Abdominal hysterectomy      Current Medications: Current Outpatient Prescriptions  Medication Sig Dispense Refill  . hydrochlorothiazide (HYDRODIURIL) 25 MG tablet Take 1 tablet (25 mg total) by mouth daily.  90 tablet  3  . lisinopril (PRINIVIL,ZESTRIL) 40 MG tablet Take 1 tablet (40 mg total) by mouth daily.  90 tablet  2   No current facility-administered medications for this visit.    Allergies: No Known Allergies  Family History: Family History  Problem Relation Age of Onset  . Hypertension Father     Social History: History  Substance Use Topics  . Smoking status: Never Smoker   . Smokeless tobacco: Never Used  . Alcohol Use: No    Review of Systems: 10 Point review of systems was completed and is negative except as noted above.   Physical Exam:   Blood pressure 161/82, pulse 70, temperature 98.2 F (36.8 C), temperature source Oral, resp. rate 20, height 5\' 7"  (1.702 m), weight 198 lb 12.8 oz (90.175 kg).  General appearance: Alert, cooperative, well nourished, no apparent distress Head: Normocephalic, without obvious abnormality, atraumatic Eyes: Conjunctivae/corneas clear, PERRLA, EOMI Nose: Nares, septum and mucosa are normal, no drainage or sinus tenderness. Neck: No adenopathy, supple, symmetrical, trachea midline, thyroid not enlarged, no tenderness Resp: Clear to auscultation bilaterally Cardio: Regular rate and rhythm, S1, S2 normal, no murmur, click, rub or gallop GI: Soft, distended, non-tender, hypoactive bowel sounds, no organomegaly  Extremities: Extremities normal, atraumatic, no cyanosis or edema, no signs of DVT Lymph nodes: Cervical, supraclavicular, and axillary nodes normal Neurologic: Grossly normal    Laboratory Data: Results for orders placed in visit on 03/11/13 (from the past 336 hour(s))  CBC WITH DIFFERENTIAL   Collection Time    03/11/13  1:18  PM      Result Value Range   WBC 4.7  3.9 - 10.3 10e3/uL   NEUT# 2.3  1.5 - 6.5 10e3/uL   HGB 13.8  11.6 - 15.9 g/dL   HCT 45.4  09.8 - 11.9 %   Platelets 174  145 - 400 10e3/uL   MCV 90.1  79.5 - 101.0 fL   MCH 30.6  25.1 - 34.0 pg   MCHC 33.9  31.5 - 36.0 g/dL   RBC 1.47  8.29 - 5.62 10e6/uL   RDW 13.0  11.2 - 14.5 %   lymph# 1.9  0.9 - 3.3 10e3/uL   MONO# 0.3  0.1 - 0.9 10e3/uL   Eosinophils Absolute 0.1  0.0 - 0.5 10e3/uL   Basophils Absolute 0.0  0.0 - 0.1 10e3/uL   NEUT% 49.8  38.4 - 76.8 %   LYMPH% 41.3  14.0 - 49.7 %   MONO% 6.0  0.0 - 14.0 %   EOS% 1.9  0.0 - 7.0 %   BASO% 1.0  0.0 - 2.0 %  LACTATE DEHYDROGENASE (CC13)   Collection Time    03/11/13  1:19 PM      Result Value Range   LDH 208  125 - 245 U/L  COMPREHENSIVE METABOLIC PANEL (CC13)   Collection Time    03/11/13  1:19 PM      Result Value Range   Sodium 142  136 - 145 mEq/L   Potassium 3.6  3.5 - 5.1 mEq/L   Chloride 106  98 - 109 mEq/L   CO2 26  22 - 29 mEq/L   Glucose 126  70 - 140 mg/dl   BUN 13.0  7.0 - 86.5 mg/dL   Creatinine 1.0  0.6 - 1.1 mg/dL   Total Bilirubin 7.84  0.20 - 1.20 mg/dL   Alkaline Phosphatase 88  40 - 150 U/L   AST 23  5 - 34 U/L   ALT 22  0 - 55 U/L   Total Protein 7.0  6.4 - 8.3 g/dL   Albumin 4.2  3.5 - 5.0 g/dL   Calcium 9.5  8.4 - 69.6 mg/dL     Imaging Studies: 1.  CT of chest with contrast on 08/26/2012 showed lung windows demonstrate mild motion degradation throughout.  Given this factor, no nodules or airspace opacities.  Soft tissue windows demonstrate no supraclavicular adenopathy. Similar calcified right thyroid nodule.  Mildly tortuous descending thoracic aorta.  Mild cardiomegaly, without pericardial or pleural effusion. No central pulmonary embolism, on this non-dedicated study.  No middle mediastinal or hilar adenopathy.  Tiny hiatal hernia.  Mildly prominent prevascular nodes.  Similar index 1.0 cm node on image 25/series 2.  IMPRESSION:  1. No acute process or  evidence of metastatic disease in the chest. 2.  Similar prominent prevascular nodes since 07/15/2011, most likely reactive. 3.  Cardiomegaly. 4.  Small hiatal hernia. 5.  Mild motion degradation.    2.  CT of abdomen/pelvis with contrast on 08/26/2012 showed motion degradation continues into the abdomen and pelvis.  Given this factor, normal liver, spleen, distal stomach, pancreas. Cholecystectomy without biliary ductal dilatation. Normal adrenal glands and kidneys. No retroperitoneal or  retrocrural adenopathy.  Surgical sutures at the rectosigmoid junction.  Similar borderline rectal wall thickening on image 114/series 2, favored to be due to underdistension. Normal terminal ileum and appendix.  Small bowel normal in caliber, without ascites.  There is increased density within the jejunal mesenteric fat with small nodes within.  Example image 76/series 2.  Similar, including back to 07/15/2011.  No evidence of omental or peritoneal disease.  No pelvic adenopathy.    Normal urinary bladder.  Hysterectomy.  No adnexal mass.  No significant free fluid.  Bone island in the right femoral head.  IMPRESSION:  1. No acute process or evidence of metastatic disease in the abdomen or pelvis. 2.  Mild motion degradation. 3.  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    3. CT CHEST  Findings: There is no axillary lymphadenopathy. Diffuse scattered  prevascular lymph nodes persist and are unchanged. No central  mediastinal or hilar lymphadenopathy. Heart size is upper normal  to mildly enlarged. There is no pericardial or pleural effusion.  Lung windows show no focal airspace consolidation. No parenchymal  nodule or mass. No evidence for pulmonary edema.  Bone windows reveal no worrisome lytic or sclerotic osseous  lesions.  IMPRESSION:  Stable. No new or progressive findings in the chest.  Borderline cardiomegaly.  CT ABDOMEN AND PELVIS  Findings: No focal  abnormalities seen in the liver or spleen. The  stomach, duodenum, pancreas, and adrenal glands are unremarkable.  The gallbladder is surgically absent. No focal abnormality is  visible in either kidney.  No abdominal aortic aneurysm. No free fluid or lymphadenopathy in  the abdomen.  Imaging through the pelvis shows no free intraperitoneal fluid. No  pelvic sidewall lymphadenopathy. Uterus is surgically absent. No  evidence for adnexal mass.  Anastomotic suture line noted in the distal sigmoid colon, near the  rectosigmoid junction. No substantial colonic diverticulosis. No  colonic diverticulitis. Terminal ileum is normal. The appendix is  normal.  Bone windows reveal no worrisome lytic or sclerotic osseous  lesions.  IMPRESSION:  No new or progressive findings. No evidence of metastatic disease.    Impression/Plan: Ms. Meleah Demeyer is a 57 year-old Caucasian female who is status post low anterior resection/partial colectomy on 08/03/2010 for a 6 cm mildly differentiated rectosigmoid adenocarcinoma. None of the 16 lymph nodes sampled had evidence of malignancy. Chemotherapy was not recommended.  Her last colonoscopy was on 05/30/2011 and showed an anastomosis at 7 cm from the anal verge and large hemorrhoids. She is doing well without any evidence of cancer recurrence.  CT scan done on 03/10/2013 showed no evidence of disease and CEA is still pending form today but was <0.5 on 08/28/2012. She will have c-scop next year.  We plan to see Ms. Curilovic in 3 months  and will we will check  labs (CMP, CBC, LDH and CEA) on next visit. Ms. Tonny Branch was encouraged to contact us in the interim if she has any questions or concerns.     Cherae Marton E,  03/11/2013, 2:19 PM

## 2013-03-12 LAB — CEA: CEA: <0.5 ng/mL (ref 0.0–5.0)

## 2013-05-18 ENCOUNTER — Telehealth: Payer: Self-pay | Admitting: *Deleted

## 2013-05-18 NOTE — Telephone Encounter (Signed)
sw pt husband informed him that the pt arrival time. gv appt for 06/10/13 w/ labs@ 11am and ov@ 11:30am....td

## 2013-06-10 ENCOUNTER — Ambulatory Visit: Payer: 59

## 2013-06-10 ENCOUNTER — Telehealth: Payer: Self-pay | Admitting: Internal Medicine

## 2013-06-10 ENCOUNTER — Other Ambulatory Visit: Payer: 59 | Admitting: Lab

## 2013-06-10 ENCOUNTER — Other Ambulatory Visit: Payer: Self-pay | Admitting: Internal Medicine

## 2013-06-10 DIAGNOSIS — C189 Malignant neoplasm of colon, unspecified: Secondary | ICD-10-CM

## 2013-06-10 NOTE — Telephone Encounter (Signed)
Gave pt appt for lab and Md for 11/17 pt came by today, appt was moved to 1130 due to MD being on call, pt caleimed that she did not get  message

## 2013-06-17 ENCOUNTER — Other Ambulatory Visit: Payer: Self-pay

## 2013-06-28 ENCOUNTER — Other Ambulatory Visit (HOSPITAL_BASED_OUTPATIENT_CLINIC_OR_DEPARTMENT_OTHER): Payer: 59 | Admitting: Lab

## 2013-06-28 ENCOUNTER — Ambulatory Visit (HOSPITAL_BASED_OUTPATIENT_CLINIC_OR_DEPARTMENT_OTHER): Payer: 59 | Admitting: Internal Medicine

## 2013-06-28 VITALS — BP 176/89 | HR 64 | Temp 98.2°F | Resp 20 | Ht 67.0 in | Wt 201.5 lb

## 2013-06-28 DIAGNOSIS — C19 Malignant neoplasm of rectosigmoid junction: Secondary | ICD-10-CM

## 2013-06-28 DIAGNOSIS — Z86718 Personal history of other venous thrombosis and embolism: Secondary | ICD-10-CM

## 2013-06-28 DIAGNOSIS — C189 Malignant neoplasm of colon, unspecified: Secondary | ICD-10-CM

## 2013-06-28 DIAGNOSIS — I82409 Acute embolism and thrombosis of unspecified deep veins of unspecified lower extremity: Secondary | ICD-10-CM

## 2013-06-28 LAB — COMPREHENSIVE METABOLIC PANEL (CC13)
Albumin: 4.2 g/dL (ref 3.5–5.0)
Anion Gap: 10 mEq/L (ref 3–11)
BUN: 20 mg/dL (ref 7.0–26.0)
CO2: 25 mEq/L (ref 22–29)
Glucose: 97 mg/dl (ref 70–140)
Potassium: 3.9 mEq/L (ref 3.5–5.1)
Sodium: 142 mEq/L (ref 136–145)
Total Protein: 6.8 g/dL (ref 6.4–8.3)

## 2013-06-28 LAB — CBC WITH DIFFERENTIAL/PLATELET
Basophils Absolute: 0.1 10*3/uL (ref 0.0–0.1)
Eosinophils Absolute: 0.1 10*3/uL (ref 0.0–0.5)
HGB: 13 g/dL (ref 11.6–15.9)
LYMPH%: 49.4 % (ref 14.0–49.7)
MONO#: 0.4 10*3/uL (ref 0.1–0.9)
NEUT#: 2.1 10*3/uL (ref 1.5–6.5)
Platelets: 174 10*3/uL (ref 145–400)
RBC: 4.26 10*6/uL (ref 3.70–5.45)
WBC: 5.2 10*3/uL (ref 3.9–10.3)

## 2013-06-29 NOTE — Progress Notes (Signed)
[facility] OFFICE PROGRESS NOTE  Doristine Section, MD [phone] [address] [place] [address]  DIAGNOSIS: Colon cancer - Plan: CBC with Differential, CEA, Comprehensive metabolic panel  Chief Complaint  Patient presents with  . Colon Cancer    CURRENT THERAPY: Observation.   INTERVAL HISTORY: [name] 57 y.o. female with a history of 6 cm moderately differentiated rectosigmoid adenocarcinoma diagnosed in 06/2010 and DVT is here for follow-up. Patient was last seen by Dr. Karel Jarvis on 03/11/2013.  She is status post low anterior resection/partial colectomy on 08/03/2010. She has been doing well.  She denies any other symptomatology during today's visit including any unusual bleeding, bruising, night sweats, fever, chills, headaches, N/V/D or constipation. She is physically active and has a good appetite. She works as an Human resources officer at [employer]. She will schedule her mammogram for later this year.   MEDICAL HISTORY: Past Medical History  Diagnosis Date  . Hypertension   . Leg DVT (deep venous thromboembolism), acute   . Uterine fibroid   . Cancer 08/03/2010    INTERIM HISTORY: has DVT (deep venous thrombosis) and Colon cancer on her problem list.    ALLERGIES:  has No Known Allergies.  MEDICATIONS: has a current medication list which includes the following prescription(s): hydrochlorothiazide and lisinopril.  SURGICAL HISTORY:  Past Surgical History  Procedure Laterality Date  . Gallbladder surgery    . Colon surgery    . Cholecystectomy    . Abdominal hysterectomy     PROBLEM LIST: 1. Status post low anterior resection/partial colectomy on 08/03/2010 for a 6 cm mildly differentiated rectosigmoid adenocarcinoma. None of 16 lymph nodes sampled had evidence of malignancy. Chemotherapy was not recommended. Her last colonoscopy was on 05/30/2011 and showed an anastomosis at 7 cm from the anal verge and large hemorrhoids.  Repeat colonoscopy in 3 years.  2. HTN  3. Left lower extremity DVT (01/2004) s/p Coumadin therapy  4. Status post TAH with a history of uterine fibroids  5. Mammogram in 2012 - She states her employer offers mammograms in October every year and she will get a mammogram in October 2014.   REVIEW OF SYSTEMS:   Constitutional: Denies fevers, chills or abnormal weight loss Eyes: Denies blurriness of vision Ears, nose, mouth, throat, and face: Denies mucositis or sore throat Respiratory: Denies cough, dyspnea or wheezes Cardiovascular: Denies palpitation, chest discomfort or lower extremity swelling Gastrointestinal:  Denies nausea, heartburn or change in bowel habits Skin: Denies abnormal skin rashes Lymphatics: Denies new lymphadenopathy or easy bruising Neurological:Denies numbness, tingling or new weaknesses Behavioral/Psych: Mood is stable, no new changes  All other systems were reviewed with the patient and are negative.  PHYSICAL EXAMINATION: ECOG PERFORMANCE STATUS: 0 - Asymptomatic  Blood pressure 176/89, pulse 64, temperature 98.2 F (36.8 C), temperature source Oral, resp. rate 20, height 5\' 7"  (1.702 m), weight 201 lb 8 oz (91.4 kg), SpO2 100.00%.  GENERAL:alert, no distress and comfortable; well developed, well nourished.  SKIN: skin color, texture, turgor are normal, no rashes or significant lesions EYES: normal, Conjunctiva are pink and non-injected, sclera clear OROPHARYNX:no exudate, no erythema and lips, buccal mucosa, and tongue normal  NECK: supple, thyroid normal size, non-tender, without nodularity LYMPH:  no palpable lymphadenopathy in the cervical, axillary or supraclavicular LUNGS: clear to auscultation and percussion with normal breathing effort HEART: regular rate & rhythm and no murmurs and no lower extremity edema ABDOMEN:abdomen soft, non-tender and normal bowel sounds; abdominal  scar well healed.  Musculoskeletal:no cyanosis of digits and no clubbing   NEURO: alert & oriented x 3 with fluent speech, no focal motor/sensory deficits   LABORATORY DATA: Results for orders placed in visit on 06/28/13 (from the past 48 hour(s))  CEA     Status: None   Collection Time    06/28/13  3:13 PM      Result Value Range   CEA <0.5  0.0 - 5.0 ng/mL  CBC WITH DIFFERENTIAL     Status: None   Collection Time    06/28/13  3:13 PM      Result Value Range   WBC 5.2  3.9 - 10.3 10e3/uL   NEUT# 2.1  1.5 - 6.5 10e3/uL   HGB 13.0  11.6 - 15.9 g/dL   HCT 16.1  09.6 - 04.5 %   Platelets 174  145 - 400 10e3/uL   MCV 91.9  79.5 - 101.0 fL   MCH 30.5  25.1 - 34.0 pg   MCHC 33.2  31.5 - 36.0 g/dL   RBC 4.09  8.11 - 9.14 10e6/uL   RDW 13.4  11.2 - 14.5 %   lymph# 2.6  0.9 - 3.3 10e3/uL   MONO# 0.4  0.1 - 0.9 10e3/uL   Eosinophils Absolute 0.1  0.0 - 0.5 10e3/uL   Basophils Absolute 0.1  0.0 - 0.1 10e3/uL   NEUT% 39.7  38.4 - 76.8 %   LYMPH% 49.4  14.0 - 49.7 %   MONO% 7.6  0.0 - 14.0 %   EOS% 2.2  0.0 - 7.0 %   BASO% 1.1  0.0 - 2.0 %  COMPREHENSIVE METABOLIC PANEL (CC13)     Status: None   Collection Time    06/28/13  3:13 PM      Result Value Range   Sodium 142  136 - 145 mEq/L   Potassium 3.9  3.5 - 5.1 mEq/L   Chloride 106  98 - 109 mEq/L   CO2 25  22 - 29 mEq/L   Glucose 97  70 - 140 mg/dl   BUN 78.2  7.0 - 95.6 mg/dL   Creatinine 0.9  0.6 - 1.1 mg/dL   Total Bilirubin 2.13  0.20 - 1.20 mg/dL   Alkaline Phosphatase 97  40 - 150 U/L   AST 21  5 - 34 U/L   ALT 19  0 - 55 U/L   Total Protein 6.8  6.4 - 8.3 g/dL   Albumin 4.2  3.5 - 5.0 g/dL   Calcium 9.6  8.4 - 08.6 mg/dL   Anion Gap 10  3 - 11 mEq/L       Labs:  Lab Results  Component Value Date   WBC 5.2 06/28/2013   HGB 13.0 06/28/2013   HCT 39.1 06/28/2013   MCV 91.9 06/28/2013   PLT 174 06/28/2013   NEUTROABS 2.1 06/28/2013      Chemistry      Component Value Date/Time   NA 142 06/28/2013 1513   NA 141 06/05/2012 1658   NA 143 07/15/2011 1123   K 3.9 06/28/2013 1513    K 4.1 06/05/2012 1658   K 3.8 07/15/2011 1123   CL 106 08/26/2012 0905   CL 103 06/05/2012 1658   CL 103 07/15/2011 1123   CO2 25 06/28/2013 1513   CO2 27 06/05/2012 1658   CO2 29 07/15/2011 1123   BUN 20.0 06/28/2013 1513   BUN 16 06/05/2012 1658   BUN 14 07/15/2011 1123  CREATININE 0.9 06/28/2013 1513   CREATININE 0.87 06/05/2012 1658   CREATININE 1.00 02/21/2012 0922      Component Value Date/Time   CALCIUM 9.6 06/28/2013 1513   CALCIUM 9.7 06/05/2012 1658   CALCIUM 8.6 07/15/2011 1123   ALKPHOS 97 06/28/2013 1513   ALKPHOS 81 06/05/2012 1658   ALKPHOS 69 07/15/2011 1123   AST 21 06/28/2013 1513   AST 19 06/05/2012 1658   AST 23 07/15/2011 1123   ALT 19 06/28/2013 1513   ALT 18 06/05/2012 1658   ALT 31 07/15/2011 1123   BILITOT 0.59 06/28/2013 1513   BILITOT 0.8 06/05/2012 1658   BILITOT 1.10 07/15/2011 1123     Basic Metabolic Panel:  Recent Labs Lab 06/28/13 1513  NA 142  K 3.9  CO2 25  GLUCOSE 97  BUN 20.0  CREATININE 0.9  CALCIUM 9.6   GFR Estimated Creatinine Clearance: 78.1 ml/min (by C-G formula based on Cr of 0.9). Liver Function Tests:  Recent Labs Lab 06/28/13 1513  AST 21  ALT 19  ALKPHOS 97  BILITOT 0.59  PROT 6.8  ALBUMIN 4.2   CBC:  Recent Labs Lab 06/28/13 1513  WBC 5.2  NEUTROABS 2.1  HGB 13.0  HCT 39.1  MCV 91.9  PLT 174   RADIOGRAPHIC STUDIES: No results found.  ASSESSMENT: [name] 57 y.o. female with a history of Colon cancer - Plan: CBC with Differential, CEA, Comprehensive metabolic panel  PLAN:  1. Colon Cancer. --She is status post low anterior resection/partial colectomy on 08/03/2010 for a 6 cm mildly differentiated rectosigmoid adenocarcinoma. None of the 16 lymph nodes sampled had evidence of malignancy. Chemotherapy was not recommended. Her last colonoscopy was on 05/30/2011 and showed an anastomosis at 7 cm from the anal verge and large hemorrhoids. She is doing well without any evidence of cancer  recurrence. CT scan done on 03/10/2013 showed no evidence of disease and CEA is negative from today. She will have c-scop next year by Dr. [name].    2. Follow-up.  We plan to see [name] in 3 months and will we will check labs (CMP, CBC, LDH and CEA) on next visit. All questions were answered. The patient knows to call the clinic with any problems, questions or concerns. We can certainly see the patient much sooner if necessary.  I spent 10 minutes counseling the patient face to face. The total time spent in the appointment was 15 minutes.    Eola Waldrep, MD 06/29/2013 6:50 PM

## 2013-12-24 ENCOUNTER — Telehealth: Payer: Self-pay | Admitting: Radiology

## 2013-12-24 ENCOUNTER — Ambulatory Visit: Payer: 59 | Admitting: Family Medicine

## 2013-12-24 VITALS — BP 140/90 | HR 64 | Temp 97.9°F | Resp 16 | Ht 67.0 in | Wt 197.0 lb

## 2013-12-24 DIAGNOSIS — L299 Pruritus, unspecified: Secondary | ICD-10-CM

## 2013-12-24 DIAGNOSIS — I83819 Varicose veins of unspecified lower extremities with pain: Secondary | ICD-10-CM

## 2013-12-24 DIAGNOSIS — I83893 Varicose veins of bilateral lower extremities with other complications: Secondary | ICD-10-CM

## 2013-12-24 DIAGNOSIS — I10 Essential (primary) hypertension: Secondary | ICD-10-CM

## 2013-12-24 DIAGNOSIS — R21 Rash and other nonspecific skin eruption: Secondary | ICD-10-CM

## 2013-12-24 DIAGNOSIS — E7800 Pure hypercholesterolemia, unspecified: Secondary | ICD-10-CM

## 2013-12-24 LAB — POCT CBC
GRANULOCYTE PERCENT: 52.3 % (ref 37–80)
HCT, POC: 45.2 % (ref 37.7–47.9)
Hemoglobin: 14.7 g/dL (ref 12.2–16.2)
LYMPH, POC: 1.7 (ref 0.6–3.4)
MCH, POC: 30.8 pg (ref 27–31.2)
MCHC: 32.5 g/dL (ref 31.8–35.4)
MCV: 94.5 fL (ref 80–97)
MID (CBC): 0.4 (ref 0–0.9)
MPV: 9.4 fL (ref 0–99.8)
PLATELET COUNT, POC: 214 10*3/uL (ref 142–424)
POC GRANULOCYTE: 2.3 (ref 2–6.9)
POC LYMPH %: 39.7 % (ref 10–50)
POC MID %: 8 % (ref 0–12)
RBC: 4.78 M/uL (ref 4.04–5.48)
RDW, POC: 13.7 %
WBC: 4.4 10*3/uL — AB (ref 4.6–10.2)

## 2013-12-24 LAB — COMPREHENSIVE METABOLIC PANEL
ALBUMIN: 4.9 g/dL (ref 3.5–5.2)
ALK PHOS: 94 U/L (ref 39–117)
ALT: 20 U/L (ref 0–35)
AST: 20 U/L (ref 0–37)
BILIRUBIN TOTAL: 0.7 mg/dL (ref 0.2–1.2)
BUN: 17 mg/dL (ref 6–23)
CO2: 28 mEq/L (ref 19–32)
Calcium: 9.9 mg/dL (ref 8.4–10.5)
Chloride: 103 mEq/L (ref 96–112)
Creat: 0.81 mg/dL (ref 0.50–1.10)
Glucose, Bld: 104 mg/dL — ABNORMAL HIGH (ref 70–99)
POTASSIUM: 4.1 meq/L (ref 3.5–5.3)
SODIUM: 142 meq/L (ref 135–145)
TOTAL PROTEIN: 7.4 g/dL (ref 6.0–8.3)

## 2013-12-24 LAB — LIPID PANEL
CHOL/HDL RATIO: 4.6 ratio
Cholesterol: 266 mg/dL — ABNORMAL HIGH (ref 0–200)
HDL: 58 mg/dL (ref >39)
LDL Cholesterol: 173 mg/dL — ABNORMAL HIGH (ref 0–99)
Triglycerides: 177 mg/dL — ABNORMAL HIGH (ref <150)
VLDL: 35 mg/dL (ref 0–40)

## 2013-12-24 MED ORDER — HYDROCHLOROTHIAZIDE 25 MG PO TABS: 25.0000 mg | ORAL_TABLET | Freq: Every day | ORAL | Status: DC

## 2013-12-24 MED ORDER — LISINOPRIL 40 MG PO TABS: 40.0000 mg | ORAL_TABLET | Freq: Every day | ORAL | Status: DC

## 2013-12-24 MED ORDER — PREDNISONE 20 MG PO TABS: ORAL_TABLET | ORAL | Status: DC

## 2013-12-24 NOTE — Progress Notes (Signed)
[facility] [address] [place] [address] [phone]  Date:  6/96/2952   Name:  [name]   DOB:  01-29-1956   MRN:  [id number]  PCP:  Gaetana Michaelis, MD    Chief Complaint: Rash and Labs Only   History of Present Illness:  [name] is a 58 y.o. very pleasant female patient who presents with the following:  She needs a refill pof her BP medication.  She has also noted a rash on her skin for 5 or 6 days.   It is itchy.  The rash is all over.   She also notes a "white bacteria" on her tongue.  Otherwise she has felt well  She has been out of her BP medication for about 3 days.  She had breakfast at 10 am, and an apple just after noon.  It is now 5pm   She also was seen at the vein clinic for chronic vein pain- she went [facility] but did not go back.  Would like a referral to another office  Patient Active Problem List   Diagnosis Date Noted  . Colon cancer 02/25/2012  . DVT (deep venous thrombosis) 08/27/2011    Past Medical History  Diagnosis Date  . Hypertension   . Leg DVT (deep venous thromboembolism), acute   . Uterine fibroid   . Cancer 08/03/2010    Past Surgical History  Procedure Laterality Date  . Gallbladder surgery    . Colon surgery    . Cholecystectomy    . Abdominal hysterectomy      History  Substance Use Topics  . Smoking status: Never Smoker   . Smokeless tobacco: Never Used  . Alcohol Use: No    Family History  Problem Relation Age of Onset  . Hypertension Father     No Known Allergies  Medication list has been reviewed and updated.  Current Outpatient Prescriptions on File Prior to Visit  Medication Sig Dispense Refill  . hydrochlorothiazide (HYDRODIURIL) 25 MG tablet Take 1 tablet (25 mg total) by mouth daily.  90 tablet  3  . lisinopril (PRINIVIL,ZESTRIL) 40 MG tablet Take 1 tablet (40 mg total) by mouth daily.  90 tablet  2   No current facility-administered  medications on file prior to visit.    Review of Systems:  As per HPI- otherwise negative.   Physical Examination: Filed Vitals:   12/24/13 1627  BP: 140/90  Pulse: 64  Temp: 97.9 F (36.6 C)  Resp: 16   Filed Vitals:   12/24/13 1627  Height: 5\' 7"  (1.702 m)  Weight: 197 lb (89.359 kg)   Body mass index is 30.85 kg/(m^2). Ideal Body Weight: Weight in (lb) to have BMI = 25: 159.3  GEN: WDWN, NAD, Non-toxic, A & O x 3, large build, looks well HEENT: Atraumatic, Normocephalic. Neck supple. No masses, No LAD.  Bilateral TM wnl, oropharynx normal.  PEERL,EOMI.   Her tongue appears normal  Ears and Nose: No external deformity. CV: RRR, No M/G/R. No JVD. No thrill. No extra heart sounds. PULM: CTA B, no wheezes, crackles, rhonchi. No retractions. No resp. distress. No accessory muscle use. ABD: S, NT, ND, +BS. No rebound. No HSM. EXTR: No c/c/e NEURO Normal gait.  PSYCH: Normally interactive. Conversant. Not depressed or anxious appearing.  Calm demeanor.  She has a fine, papular, pruritic rash mostly over her anterior neck and chest and both arms.  Not on legs, only a  small amount on trunk  She notes a tender, superficial vein on the left medial calf that has bothered her for a long time   Results for orders placed in visit on 12/24/13  POCT CBC      Result Value Ref Range   WBC 4.4 (*) 4.6 - 10.2 K/uL   Lymph, poc 1.7  0.6 - 3.4   POC LYMPH PERCENT 39.7  10 - 50 %L   MID (cbc) 0.4  0 - 0.9   POC MID % 8.0  0 - 12 %M   POC Granulocyte 2.3  2 - 6.9   Granulocyte percent 52.3  37 - 80 %G   RBC 4.78  4.04 - 5.48 M/uL   Hemoglobin 14.7  12.2 - 16.2 g/dL   HCT, POC 45.2  37.7 - 47.9 %   MCV 94.5  80 - 97 fL   MCH, POC 30.8  27 - 31.2 pg   MCHC 32.5  31.8 - 35.4 g/dL   RDW, POC 13.7     Platelet Count, POC 214  142 - 424 K/uL   MPV 9.4  0 - 99.8 fL   Assessment and Plan: HTN (hypertension) - Plan: hydrochlorothiazide (HYDRODIURIL) 25 MG tablet, lisinopril  (PRINIVIL,ZESTRIL) 40 MG tablet, Comprehensive metabolic panel  Itching - Plan: POCT CBC, TSH, predniSONE (DELTASONE) 20 MG tablet  Rash and nonspecific skin eruption - Plan: POCT CBC, HIV antibody, RPR, predniSONE (DELTASONE) 20 MG tablet  High cholesterol - Plan: Lipid panel  Varicose veins with pain - Plan: Ambulatory referral to Vascular Surgery  Refilled her BP medications.  Await labs, prednisone for rash.  Suspect a non- specific dermatitis.   Referral to vascular surgery for her veins   Signed Lamar Blinks, MD

## 2013-12-24 NOTE — Telephone Encounter (Signed)
Patient has asked for the name of the vein specialist she is to see, call back number is [phone]

## 2013-12-24 NOTE — Patient Instructions (Signed)
Continue your blood pressure medication, and use the prednisone as directed for your rash.  Let me know if you are not better in the next few days, and I will be in touch with your labs.

## 2013-12-25 ENCOUNTER — Encounter: Payer: Self-pay | Admitting: Family Medicine

## 2013-12-25 LAB — TSH: TSH: 1.947 u[IU]/mL (ref 0.350–4.500)

## 2013-12-25 LAB — RPR

## 2013-12-25 LAB — HIV ANTIBODY (ROUTINE TESTING W REFLEX): HIV 1&2 Ab, 4th Generation: NONREACTIVE

## 2013-12-25 NOTE — Telephone Encounter (Signed)
Called this number and other home number listed- both rang as disconnected.  Will send mychart message.

## 2013-12-27 ENCOUNTER — Ambulatory Visit: Payer: 59

## 2013-12-27 ENCOUNTER — Other Ambulatory Visit: Payer: 59

## 2013-12-28 ENCOUNTER — Other Ambulatory Visit: Payer: Self-pay

## 2013-12-29 ENCOUNTER — Telehealth: Payer: Self-pay | Admitting: Internal Medicine

## 2013-12-29 NOTE — Telephone Encounter (Signed)
lvm for pt regarding to June appt...mailed pt appt sched/letter

## 2014-01-26 ENCOUNTER — Encounter: Payer: Self-pay | Admitting: Internal Medicine

## 2014-01-26 ENCOUNTER — Other Ambulatory Visit (HOSPITAL_BASED_OUTPATIENT_CLINIC_OR_DEPARTMENT_OTHER): Payer: 59

## 2014-01-26 ENCOUNTER — Ambulatory Visit (HOSPITAL_BASED_OUTPATIENT_CLINIC_OR_DEPARTMENT_OTHER): Payer: 59 | Admitting: Internal Medicine

## 2014-01-26 ENCOUNTER — Telehealth: Payer: Self-pay | Admitting: Internal Medicine

## 2014-01-26 VITALS — BP 160/79 | HR 67 | Temp 98.2°F | Resp 18 | Ht 67.0 in | Wt 196.5 lb

## 2014-01-26 DIAGNOSIS — Z85048 Personal history of other malignant neoplasm of rectum, rectosigmoid junction, and anus: Secondary | ICD-10-CM

## 2014-01-26 DIAGNOSIS — C189 Malignant neoplasm of colon, unspecified: Secondary | ICD-10-CM

## 2014-01-26 DIAGNOSIS — I82409 Acute embolism and thrombosis of unspecified deep veins of unspecified lower extremity: Secondary | ICD-10-CM

## 2014-01-26 DIAGNOSIS — Z86718 Personal history of other venous thrombosis and embolism: Secondary | ICD-10-CM

## 2014-01-26 LAB — COMPREHENSIVE METABOLIC PANEL (CC13)
ALK PHOS: 85 U/L (ref 40–150)
ALT: 15 U/L (ref 0–55)
AST: 16 U/L (ref 5–34)
Albumin: 4.2 g/dL (ref 3.5–5.0)
Anion Gap: 9 mEq/L (ref 3–11)
BUN: 15.4 mg/dL (ref 7.0–26.0)
CO2: 29 mEq/L (ref 22–29)
CREATININE: 1.1 mg/dL (ref 0.6–1.1)
Calcium: 9.4 mg/dL (ref 8.4–10.4)
Chloride: 109 mEq/L (ref 98–109)
Glucose: 158 mg/dl — ABNORMAL HIGH (ref 70–140)
Potassium: 4 mEq/L (ref 3.5–5.1)
Sodium: 146 mEq/L — ABNORMAL HIGH (ref 136–145)
Total Bilirubin: 0.67 mg/dL (ref 0.20–1.20)
Total Protein: 6.8 g/dL (ref 6.4–8.3)

## 2014-01-26 LAB — CBC WITH DIFFERENTIAL/PLATELET
BASO%: 1.1 % (ref 0.0–2.0)
Basophils Absolute: 0 10*3/uL (ref 0.0–0.1)
EOS%: 2.1 % (ref 0.0–7.0)
Eosinophils Absolute: 0.1 10*3/uL (ref 0.0–0.5)
HEMATOCRIT: 41.2 % (ref 34.8–46.6)
HGB: 13.6 g/dL (ref 11.6–15.9)
LYMPH#: 1.7 10*3/uL (ref 0.9–3.3)
LYMPH%: 42.8 % (ref 14.0–49.7)
MCH: 30.3 pg (ref 25.1–34.0)
MCHC: 33 g/dL (ref 31.5–36.0)
MCV: 91.8 fL (ref 79.5–101.0)
MONO#: 0.2 10*3/uL (ref 0.1–0.9)
MONO%: 5.9 % (ref 0.0–14.0)
NEUT#: 1.9 10*3/uL (ref 1.5–6.5)
NEUT%: 48.1 % (ref 38.4–76.8)
PLATELETS: 169 10*3/uL (ref 145–400)
RBC: 4.49 10*6/uL (ref 3.70–5.45)
RDW: 12.7 % (ref 11.2–14.5)
WBC: 4 10*3/uL (ref 3.9–10.3)

## 2014-01-26 NOTE — Telephone Encounter (Signed)
Gave pt appt for December a 6 months appt

## 2014-01-26 NOTE — Progress Notes (Signed)
[place], [place] [address]  [place] [address]  DIAGNOSIS: DVT (deep venous thrombosis)  Colon cancer - Plan: CBC with Differential, Lactate dehydrogenase (LDH) - [facility], Comprehensive metabolic panel (Cmet) - [facility], CEA  Chief Complaint  Patient presents with  . Colon Cancer    CURRENT THERAPY: Observation.   INTERVAL HISTORY: [name] 58 y.o. female with a history of 6 cm moderately differentiated rectosigmoid adenocarcinoma diagnosed in 06/2010 and DVT is here for follow-up. Patient was last seen by me on 06/28/2013.  She is status post low anterior resection/partial colectomy on 08/03/2010. She has been doing well.  She denies any other symptomatology during today's visit including any unusual bleeding, bruising, night sweats, fever, chills, headaches, N/V/D or constipation. She is physically active and has a good appetite. 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  MEDICAL HISTORY: Past Medical History  Diagnosis Date  . Hypertension   . Leg DVT (deep venous thromboembolism), acute   . Uterine fibroid   . Cancer 08/03/2010    INTERIM HISTORY: has DVT (deep venous thrombosis); Colon cancer; and HTN (hypertension) on her problem list.    ALLERGIES:  has No Known Allergies.  MEDICATIONS: has a current medication list which includes the following prescription(s): hydrochlorothiazide, lisinopril, and prednisone.  SURGICAL HISTORY:  Past Surgical History  Procedure Laterality Date  . Gallbladder surgery    . Colon surgery    . Cholecystectomy    . Abdominal hysterectomy     PROBLEM LIST: 1. Status post low anterior resection/partial colectomy on 08/03/2010 for a 6 cm mildly differentiated rectosigmoid adenocarcinoma. None of 16 lymph nodes sampled had evidence of malignancy. Chemotherapy was not recommended. Her last colonoscopy was on 05/30/2011 and showed an  anastomosis at 7 cm from the anal verge and large hemorrhoids. Repeat colonoscopy in 3 years.  2. HTN  3. Left lower extremity DVT (01/2004) s/p Coumadin therapy  4. Status post TAH with a history of uterine fibroids  5. Mammogram in 2012 - She states her employer offers mammograms in October every year and she will get a mammogram in October 2014.   REVIEW OF SYSTEMS:   Constitutional: Denies fevers, chills or abnormal weight loss Eyes: Denies blurriness of vision Ears, nose, mouth, throat, and face: Denies mucositis or sore throat Respiratory: Denies cough, dyspnea or wheezes Cardiovascular: Denies palpitation, chest discomfort or lower extremity swelling Gastrointestinal:  Denies nausea, heartburn or change in bowel habits Skin: Denies abnormal skin rashes Lymphatics: Denies new lymphadenopathy or easy bruising Neurological:Denies numbness, tingling or new weaknesses Behavioral/Psych: Mood is stable, no new changes  All other systems were reviewed with the patient and are negative.  PHYSICAL EXAMINATION: ECOG PERFORMANCE STATUS: 0 - Asymptomatic  Blood pressure 160/79, pulse 67, temperature 98.2 F (36.8 C), temperature source Oral, resp. rate 18, height 5\' 7"  (1.702 m), weight 196 lb 8 oz (89.132 kg).  GENERAL:alert, no distress and comfortable; well developed, well nourished.  SKIN: skin color, texture, turgor are normal, no rashes or significant lesions EYES: normal, Conjunctiva are pink and non-injected, sclera clear OROPHARYNX:no exudate, no erythema and lips, buccal mucosa, and tongue normal  NECK: supple, thyroid normal size, non-tender, without nodularity LYMPH:  no palpable lymphadenopathy in the cervical, axillary or supraclavicular LUNGS: clear to auscultation and percussion with normal breathing effort HEART: regular rate & rhythm and no murmurs and no lower extremity edema ABDOMEN:abdomen soft, non-tender and  normal bowel sounds; abdominal scar well healed.   Musculoskeletal:no cyanosis of digits and no clubbing  NEURO: alert & oriented x 3 with fluent speech, no focal motor/sensory deficits   LABORATORY DATA: Results for orders placed in visit on 01/26/14 (from the past 48 hour(s))  CBC WITH DIFFERENTIAL     Status: None   Collection Time    01/26/14  2:06 PM      Result Value Ref Range   WBC 4.0  3.9 - 10.3 10e3/uL   NEUT# 1.9  1.5 - 6.5 10e3/uL   HGB 13.6  11.6 - 15.9 g/dL   HCT 41.2  34.8 - 46.6 %   Platelets 169  145 - 400 10e3/uL   MCV 91.8  79.5 - 101.0 fL   MCH 30.3  25.1 - 34.0 pg   MCHC 33.0  31.5 - 36.0 g/dL   RBC 4.49  3.70 - 5.45 10e6/uL   RDW 12.7  11.2 - 14.5 %   lymph# 1.7  0.9 - 3.3 10e3/uL   MONO# 0.2  0.1 - 0.9 10e3/uL   Eosinophils Absolute 0.1  0.0 - 0.5 10e3/uL   Basophils Absolute 0.0  0.0 - 0.1 10e3/uL   NEUT% 48.1  38.4 - 76.8 %   LYMPH% 42.8  14.0 - 49.7 %   MONO% 5.9  0.0 - 14.0 %   EOS% 2.1  0.0 - 7.0 %   BASO% 1.1  0.0 - 2.0 %  COMPREHENSIVE METABOLIC PANEL (DD22)     Status: Abnormal   Collection Time    01/26/14  2:06 PM      Result Value Ref Range   Sodium 146 (*) 136 - 145 mEq/L   Potassium 4.0  3.5 - 5.1 mEq/L   Chloride 109  98 - 109 mEq/L   CO2 29  22 - 29 mEq/L   Glucose 158 (*) 70 - 140 mg/dl   BUN 15.4  7.0 - 26.0 mg/dL   Creatinine 1.1  0.6 - 1.1 mg/dL   Total Bilirubin 0.67  0.20 - 1.20 mg/dL   Alkaline Phosphatase 85  40 - 150 U/L   AST 16  5 - 34 U/L   ALT 15  0 - 55 U/L   Total Protein 6.8  6.4 - 8.3 g/dL   Albumin 4.2  3.5 - 5.0 g/dL   Calcium 9.4  8.4 - 10.4 mg/dL   Anion Gap 9  3 - 11 mEq/L    Labs:  Lab Results  Component Value Date   WBC 4.0 01/26/2014   HGB 13.6 01/26/2014   HCT 41.2 01/26/2014   MCV 91.8 01/26/2014   PLT 169 01/26/2014   NEUTROABS 1.9 01/26/2014      Chemistry      Component Value Date/Time   NA 146* 01/26/2014 1406   NA 142 12/24/2013 1722   NA 143 07/15/2011 1123   K 4.0 01/26/2014 1406   K 4.1 12/24/2013 1722   K 3.8 07/15/2011 1123   CL 103  12/24/2013 1722   CL 106 08/26/2012 0905   CL 103 07/15/2011 1123   CO2 29 01/26/2014 1406   CO2 28 12/24/2013 1722   CO2 29 07/15/2011 1123   BUN 15.4 01/26/2014 1406   BUN 17 12/24/2013 1722   BUN 14 07/15/2011 1123   CREATININE 1.1 01/26/2014 1406   CREATININE 0.81 12/24/2013 1722   CREATININE 1.00 02/21/2012 0922      Component Value Date/Time   CALCIUM 9.4 01/26/2014 1406  CALCIUM 9.9 12/24/2013 1722   CALCIUM 8.6 07/15/2011 1123   ALKPHOS 85 01/26/2014 1406   ALKPHOS 94 12/24/2013 1722   ALKPHOS 69 07/15/2011 1123   AST 16 01/26/2014 1406   AST 20 12/24/2013 1722   AST 23 07/15/2011 1123   ALT 15 01/26/2014 1406   ALT 20 12/24/2013 1722   ALT 31 07/15/2011 1123   BILITOT 0.67 01/26/2014 1406   BILITOT 0.7 12/24/2013 1722   BILITOT 1.10 07/15/2011 1123     Basic Metabolic Panel:  Recent Labs Lab 01/26/14 1406  NA 146*  K 4.0  CO2 29  GLUCOSE 158*  BUN 15.4  CREATININE 1.1  CALCIUM 9.4   GFR Estimated Creatinine Clearance: 62.3 ml/min (by C-G formula based on Cr of 1.1). Liver Function Tests:  Recent Labs Lab 01/26/14 1406  AST 16  ALT 15  ALKPHOS 85  BILITOT 0.67  PROT 6.8  ALBUMIN 4.2   CBC:  Recent Labs Lab 01/26/14 1406  WBC 4.0  NEUTROABS 1.9  HGB 13.6  HCT 41.2  MCV 91.8  PLT 169   RADIOGRAPHIC STUDIES: No results found.  ASSESSMENT: [name] 58 y.o. female with a history of DVT (deep venous thrombosis)  Colon cancer - Plan: CBC with Differential, Lactate dehydrogenase (LDH) - [facility], Comprehensive metabolic panel (Cmet) - [facility], CEA  PLAN:  1. Colon Cancer. --Clinically, she is stable. She is status post low anterior resection/partial colectomy on 08/03/2010 for a 6 cm mildly differentiated rectosigmoid adenocarcinoma. None of the 16 lymph nodes sampled had evidence of malignancy. Chemotherapy was not recommended. Her last colonoscopy was on 05/30/2011 and showed an anastomosis at 7 cm from the anal verge and large hemorrhoids. She is doing well  without any evidence of cancer recurrence. CT scan done on 03/10/2013 showed no evidence of disease and CEA is pending from today. She will have c-scop later this year by Dr. Benson Norway.    2. Follow-up.  We plan to see Ms. Curilovic in 6 months and will we will check labs (CMP, CBC, LDH and CEA) on next visit. All questions were answered. The patient knows to call the clinic with any problems, questions or concerns. We can certainly see the patient much sooner if necessary.  I spent 10 minutes counseling the patient face to face. The total time spent in the appointment was 15 minutes.    CHISM, DAVID, MD 01/26/2014 4:25 PM

## 2014-01-27 LAB — CEA

## 2014-05-27 ENCOUNTER — Other Ambulatory Visit: Payer: Self-pay

## 2014-06-20 ENCOUNTER — Ambulatory Visit (INDEPENDENT_AMBULATORY_CARE_PROVIDER_SITE_OTHER): Payer: 59 | Admitting: Family Medicine

## 2014-06-20 VITALS — BP 176/92 | HR 74 | Temp 98.2°F | Resp 16 | Ht 67.0 in | Wt 201.0 lb

## 2014-06-20 DIAGNOSIS — Z1389 Encounter for screening for other disorder: Secondary | ICD-10-CM

## 2014-06-20 DIAGNOSIS — I10 Essential (primary) hypertension: Secondary | ICD-10-CM

## 2014-06-20 LAB — BASIC METABOLIC PANEL
BUN: 14 mg/dL (ref 6–23)
CHLORIDE: 105 meq/L (ref 96–112)
CO2: 32 meq/L (ref 19–32)
CREATININE: 1.19 mg/dL — AB (ref 0.50–1.10)
Calcium: 9.2 mg/dL (ref 8.4–10.5)
GLUCOSE: 142 mg/dL — AB (ref 70–99)
POTASSIUM: 3.9 meq/L (ref 3.5–5.3)
Sodium: 143 mEq/L (ref 135–145)

## 2014-06-20 MED ORDER — HYDROCHLOROTHIAZIDE 25 MG PO TABS: 25.0000 mg | ORAL_TABLET | Freq: Every day | ORAL | Status: DC

## 2014-06-20 MED ORDER — AMLODIPINE BESYLATE 5 MG PO TABS: 5.0000 mg | ORAL_TABLET | Freq: Every day | ORAL | Status: DC

## 2014-06-20 MED ORDER — LISINOPRIL 40 MG PO TABS: 40.0000 mg | ORAL_TABLET | Freq: Every day | ORAL | Status: DC

## 2014-06-20 NOTE — Progress Notes (Signed)
   Subjective:    Patient ID: [name], female    DOB: [birth year], 58 y.o.   MRN: [id number]  Kendra Michaelis, MD  Chief Complaint  Patient presents with  . Medication Refill    Lisinopril   Patient Active Problem List   Diagnosis Date Noted  . HTN (hypertension) 12/24/2013  . Colon cancer 02/25/2012  . DVT (deep venous thrombosis) 08/27/2011   Prior to Admission medications   Medication Sig Start Date End Date Taking? Authorizing Provider  hydrochlorothiazide (HYDRODIURIL) 25 MG tablet Take 1 tablet (25 mg total) by mouth daily. 12/24/13  Yes Kendra Filler Copland, MD  lisinopril (PRINIVIL,ZESTRIL) 40 MG tablet Take 1 tablet (40 mg total) by mouth daily. 12/24/13  Yes Kendra Mclean, MD   Medications, allergies, past medical history, surgical history, family history, social history and problem list reviewed and updated.  HPI  89 yof with PMH htn presents for medication (lisinopril) refill.    She is on 40 lisinopril and 25 hctz at home. She has been taking both of her meds as prescribed. She takes her BP at home and it usually runs 140-150s/mid 80s. She has been out of her lisinopril the past 2 wks. BP yest at home was 190/99 and today in clinic is 176/92. She denies any CP, SOB, palps, syncope, or presyncope.   She was last seen here 5 months ago for a BP med refill. SCr was slightly up for her to 1.1 and K was stable at 4.0 during that visit.   She does not exercise or watch her salt intake at home.   Review of Systems No fever, chills. See HPI.     Objective:   Physical Exam  Constitutional: She is oriented to person, place, and time. She appears well-developed and well-nourished.  Non-toxic appearance. She does not have a sickly appearance. She does not appear ill. No distress.  BP 176/92 mmHg  Pulse 74  Temp(Src) 98.2 F (36.8 C)  Resp 16  Ht 5\' 7"  (1.702 m)  Wt 201 lb (91.173 kg)  BMI 31.47 kg/m2  SpO2 98%   Cardiovascular: Normal rate, regular rhythm, S1  normal, S2 normal and normal heart sounds.  Exam reveals no gallop.   No murmur heard. Pulmonary/Chest: Effort normal and breath sounds normal. She has no decreased breath sounds. She has no wheezes. She has no rhonchi. She has no rales.  Neurological: She is alert and oriented to person, place, and time.  Psychiatric: She has a normal mood and affect. Her speech is normal.      Assessment & Plan:   19 yof with PMH htn presents for medication (lisinopril) refill.    Essential hypertension - Plan: Basic metabolic panel, amLODipine (NORVASC) 5 MG tablet, lisinopril (PRINIVIL,ZESTRIL) 40 MG tablet, hydrochlorothiazide (HYDRODIURIL) 25 MG tablet --BP has not been well controlled at home even while taking meds as prescribed --Added 5mg  amlodipine to regimen, refills given --Sent note to scheduling pool to contact pt to f/u in 3 months, pt informed --Encouraged aerobic exercise/healthy diet with limited salt  Screening for nephropathy - Plan: Basic metabolic panel   Julieta Gutting, PA-C Physician Assistant-Certified [facility]  06/20/2014 5:47 PM

## 2014-06-20 NOTE — Patient Instructions (Addendum)
We drew labs today to check your kidneys and potassium levels. We will let you know the results.  Your blood pressure has not been well controlled at home. We've added a 3rd BP medication, 5mg  amlodipine once a day. Please continue to take your BP every day at home. I've instructed the clinic to give you a call to schedule you to come into clinic in 3 months to see how your BP is running. With the medication we started you have to keep an eye out for swelling in your lower extremities. If you notice this please contact the clinic.

## 2014-06-23 NOTE — Progress Notes (Signed)
Left message for patient to call back to schedule an appointment.  °

## 2014-06-27 NOTE — Progress Notes (Signed)
Duplicate entry

## 2014-06-28 NOTE — Progress Notes (Signed)
Called patient to schedule 3 month follow up.  There was a language barrier with person answering phone.  Will call back at a later time.

## 2014-06-29 ENCOUNTER — Telehealth: Payer: Self-pay

## 2014-06-29 NOTE — Progress Notes (Signed)
Reviewed documentation and agree w/ assessment and plan. Eva Shaw, MD MPH 

## 2014-07-13 ENCOUNTER — Other Ambulatory Visit: Payer: Self-pay | Admitting: *Deleted

## 2014-07-13 DIAGNOSIS — I83813 Varicose veins of bilateral lower extremities with pain: Secondary | ICD-10-CM

## 2014-07-18 NOTE — Progress Notes (Signed)
LMVM for pt to CB to schedule a February appt with Dr. Brigitte Pulse.

## 2014-07-20 ENCOUNTER — Telehealth: Payer: Self-pay | Admitting: Hematology

## 2014-07-20 NOTE — Telephone Encounter (Signed)
MOVED FROM CP1 12/17 TO YF 12/28. S/W RELATIVE AND MAILED SCHEDULE.

## 2014-07-28 ENCOUNTER — Ambulatory Visit: Payer: 59

## 2014-07-28 ENCOUNTER — Other Ambulatory Visit: Payer: 59

## 2014-08-04 ENCOUNTER — Other Ambulatory Visit: Payer: Self-pay | Admitting: *Deleted

## 2014-08-04 DIAGNOSIS — C189 Malignant neoplasm of colon, unspecified: Secondary | ICD-10-CM

## 2014-08-07 NOTE — Telephone Encounter (Signed)
ERROR

## 2014-08-08 ENCOUNTER — Ambulatory Visit (HOSPITAL_BASED_OUTPATIENT_CLINIC_OR_DEPARTMENT_OTHER): Payer: 59 | Admitting: Hematology

## 2014-08-08 ENCOUNTER — Other Ambulatory Visit (HOSPITAL_BASED_OUTPATIENT_CLINIC_OR_DEPARTMENT_OTHER): Payer: 59

## 2014-08-08 ENCOUNTER — Telehealth: Payer: Self-pay | Admitting: Hematology

## 2014-08-08 ENCOUNTER — Encounter: Payer: Self-pay | Admitting: Hematology

## 2014-08-08 VITALS — BP 159/91 | HR 109 | Temp 98.0°F | Resp 18 | Ht 67.0 in | Wt 199.0 lb

## 2014-08-08 DIAGNOSIS — Z86718 Personal history of other venous thrombosis and embolism: Secondary | ICD-10-CM

## 2014-08-08 DIAGNOSIS — C189 Malignant neoplasm of colon, unspecified: Secondary | ICD-10-CM

## 2014-08-08 DIAGNOSIS — Z85038 Personal history of other malignant neoplasm of large intestine: Secondary | ICD-10-CM

## 2014-08-08 LAB — COMPREHENSIVE METABOLIC PANEL (CC13)
ALT: 23 U/L (ref 0–55)
ANION GAP: 9 meq/L (ref 3–11)
AST: 22 U/L (ref 5–34)
Albumin: 4.3 g/dL (ref 3.5–5.0)
Alkaline Phosphatase: 92 U/L (ref 40–150)
BUN: 18.7 mg/dL (ref 7.0–26.0)
CALCIUM: 9.1 mg/dL (ref 8.4–10.4)
CHLORIDE: 105 meq/L (ref 98–109)
CO2: 28 meq/L (ref 22–29)
Creatinine: 0.9 mg/dL (ref 0.6–1.1)
EGFR: 70 mL/min/{1.73_m2} — ABNORMAL LOW (ref >90)
GLUCOSE: 93 mg/dL (ref 70–140)
Potassium: 3.6 mEq/L (ref 3.5–5.1)
SODIUM: 142 meq/L (ref 136–145)
TOTAL PROTEIN: 6.6 g/dL (ref 6.4–8.3)
Total Bilirubin: 0.48 mg/dL (ref 0.20–1.20)

## 2014-08-08 LAB — CBC WITH DIFFERENTIAL/PLATELET
BASO%: 0.7 % (ref 0.0–2.0)
Basophils Absolute: 0 10*3/uL (ref 0.0–0.1)
EOS%: 1.9 % (ref 0.0–7.0)
Eosinophils Absolute: 0.1 10*3/uL (ref 0.0–0.5)
HEMATOCRIT: 38.9 % (ref 34.8–46.6)
HGB: 13 g/dL (ref 11.6–15.9)
LYMPH%: 43.8 % (ref 14.0–49.7)
MCH: 30.4 pg (ref 25.1–34.0)
MCHC: 33.4 g/dL (ref 31.5–36.0)
MCV: 91.1 fL (ref 79.5–101.0)
MONO#: 0.4 10*3/uL (ref 0.1–0.9)
MONO%: 8.5 % (ref 0.0–14.0)
NEUT#: 1.9 10*3/uL (ref 1.5–6.5)
NEUT%: 45.1 % (ref 38.4–76.8)
PLATELETS: 164 10*3/uL (ref 145–400)
RBC: 4.27 10*6/uL (ref 3.70–5.45)
RDW: 13 % (ref 11.2–14.5)
WBC: 4.1 10*3/uL (ref 3.9–10.3)
lymph#: 1.8 10*3/uL (ref 0.9–3.3)

## 2014-08-08 LAB — LACTATE DEHYDROGENASE (CC13): LDH: 198 U/L (ref 125–245)

## 2014-08-08 NOTE — Progress Notes (Signed)
[place], [place] [address]  DIAGNOSIS: Colon cancer  History of colon cancer - Plan: CBC with Differential, Comprehensive metabolic panel (Cmet) - [facility], [facility]  CHIEF COMPLAIN: follow up   CURRENT THERAPY: Observation.   PROBLEM LIST: 1. Status post low anterior resection/partial colectomy on 08/03/2010 for a 6 cm mildly differentiated rectosigmoid adenocarcinoma. None of 16 lymph nodes sampled had evidence of malignancy. Chemotherapy was not recommended. Her last colonoscopy was on 05/30/2011 and showed an anastomosis at 7 cm from the anal verge and large hemorrhoids. Repeat colonoscopy in 3 years.  2. HTN  3. Left lower extremity DVT (01/2004) s/p Coumadin therapy  4. Status post TAH with a history of uterine fibroids  5. Mammogram in 2012 - She states her employer offers mammograms in October every year and she will get a mammogram in October 2014.   INTERVAL HISTORY: [name] 58 y.o. female with a history of early stage colon cancer and DVT is here for follow-up. Patient was last seen by Dr. Juliann Mule 6 months ago, who has left the practice.   She is doing well overall. She denies any pain, nausea, abdominal discomfort, change of his her bowel habits. No recent weight loss. 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 She is scheduled to have a repeated colonoscopy in a few weeks.   MEDICAL HISTORY: Past Medical History  Diagnosis Date  . Hypertension   . Leg DVT (deep venous thromboembolism), acute   . Uterine fibroid   . Cancer 08/03/2010    INTERIM HISTORY: has DVT (deep venous thrombosis); Colon cancer; and HTN (hypertension) on her problem list.    ALLERGIES:  has No Known Allergies.  MEDICATIONS: has a current medication list which includes the following prescription(s): amlodipine, hydrochlorothiazide, and lisinopril.  SURGICAL HISTORY:  Past Surgical History  Procedure  Laterality Date  . Gallbladder surgery    . Colon surgery    . Cholecystectomy    . Abdominal hysterectomy       REVIEW OF SYSTEMS:   Constitutional: Denies fevers, chills or abnormal weight loss Eyes: Denies blurriness of vision Ears, nose, mouth, throat, and face: Denies mucositis or sore throat Respiratory: Denies cough, dyspnea or wheezes Cardiovascular: Denies palpitation, chest discomfort or lower extremity swelling Gastrointestinal:  Denies nausea, heartburn or change in bowel habits Skin: Denies abnormal skin rashes Lymphatics: Denies new lymphadenopathy or easy bruising Neurological:Denies numbness, tingling or new weaknesses Behavioral/Psych: Mood is stable, no new changes  All other systems were reviewed with the patient and are negative.  PHYSICAL EXAMINATION: ECOG PERFORMANCE STATUS: 0 - Asymptomatic  Blood pressure 159/91, pulse 109, temperature 98 F (36.7 C), temperature source Oral, resp. rate 18, height 5\' 7"  (1.702 m), weight 199 lb (90.266 kg), SpO2 99 %.  GENERAL:alert, no distress and comfortable; well developed, well nourished.  SKIN: skin color, texture, turgor are normal, no rashes or significant lesions EYES: normal, Conjunctiva are pink and non-injected, sclera clear OROPHARYNX:no exudate, no erythema and lips, buccal mucosa, and tongue normal  NECK: supple, thyroid normal size, non-tender, without nodularity LYMPH:  no palpable lymphadenopathy in the cervical, axillary or supraclavicular LUNGS: clear to auscultation and percussion with normal breathing effort HEART: regular rate & rhythm and no murmurs and no lower extremity edema ABDOMEN:abdomen soft, non-tender and normal bowel sounds; abdominal scar well healed.  Musculoskeletal:no cyanosis of digits and no clubbing  NEURO: alert & oriented x 3 with fluent speech, no  focal motor/sensory deficits   LABORATORY DATA: CBC Latest Ref Rng 08/08/2014 01/26/2014 12/24/2013  WBC 3.9 - 10.3 10e3/uL 4.1 4.0  4.4(A)  Hemoglobin 11.6 - 15.9 g/dL 13.0 13.6 14.7  Hematocrit 34.8 - 46.6 % 38.9 41.2 45.2  Platelets 145 - 400 10e3/uL 164 169 -    CMP Latest Ref Rng 08/08/2014 06/20/2014 01/26/2014  Glucose 70 - 140 mg/dl 93 142(H) 158(H)  BUN 7.0 - 26.0 mg/dL 18.7 14 15.4  Creatinine 0.6 - 1.1 mg/dL 0.9 1.19(H) 1.1  Sodium 136 - 145 mEq/L 142 143 146(H)  Potassium 3.5 - 5.1 mEq/L 3.6 3.9 4.0  Chloride 96 - 112 mEq/L - 105 -  CO2 22 - 29 mEq/L 28 32 29  Calcium 8.4 - 10.4 mg/dL 9.1 9.2 9.4  Total Protein 6.4 - 8.3 g/dL 6.6 - 6.8  Albumin 3.3 - 5.5 g/dL - - -  Total Bilirubin 0.20 - 1.20 mg/dL 0.48 - 0.67  Alkaline Phos 40 - 150 U/L 92 - 85  AST 5 - 34 U/L 22 - 16  ALT 0 - 55 U/L 23 - 15    CEA (Order [id number])      CEA  Status: Finalresult Visible to patient:  MyChart Nextappt: 08/26/2014 at 01:30 PM in Radiology ([facility]) Dx:  Colon cancer           Ref Range 74mo ago (01/26/14) 46yr ago (06/28/13) 69yr ago (03/11/13)    CEA 0.0 - 5.0 ng/mL <0.5 <0.5 <0.5        RADIOGRAPHIC STUDIES: No results found.  ASSESSMENT: [name] 58 y.o. female with a history of Colon cancer  History of colon cancer - Plan: CBC with Differential, Comprehensive metabolic panel (Cmet) - [facility], [facility]  PLAN:  1. History of Colon Cancer, [id number], stage I  --She is status post low anterior resection/partial colectomy on 08/03/2010 for a 6 cm mildly differentiated rectosigmoid adenocarcinoma. None of the 16 lymph nodes sampled had evidence of malignancy. Chemotherapy was not recommended. -She is 4 years out of surgery, the risk of cancer recurrence has significantly reduced. We discussed to complete 5 years surveillance. - She is scheduled to repeat colonoscopy in a few weeks. She will request her gastroenterologist to send a copy of the result to me. Her last colonoscopy was on 05/30/2011 and showed an anastomosis at 7 cm from the anal verge and large hemorrhoids.  -She is doing well  without any evidence of cancer recurrence. CT scan done on 03/10/2013 showed no evidence of disease. Her CEA level has been normal, today's CEA is still PEnding. I'll call her in a few days with the results.  2. Cancer screening -I encouraged her to get annual mammogram for rest cancer screening. Her last mammogram was at least 3-4 years ago.  3. History of DVT -No clinical evidence of recurrence.  -Continue observation. Strategies to reduce risk of DVT was discussed with her.  Follow-up: One year with lab CBC, CMP and CEA.  I spent 10 minutes counseling the patient face to face. The total time spent in the appointment was 15 minutes.    Truitt Merle, MD 08/08/2014 6:33 PM

## 2014-08-08 NOTE — Telephone Encounter (Signed)
Pt confirmed labs/ov per 12/28 POF, gave pt AVS.... KJ

## 2014-08-09 LAB — CEA: CEA: <0.5 ng/mL (ref 0.0–5.0)

## 2014-08-25 ENCOUNTER — Encounter: Payer: Self-pay | Admitting: Vascular Surgery

## 2014-08-26 ENCOUNTER — Encounter: Payer: Self-pay | Admitting: Vascular Surgery

## 2014-08-26 ENCOUNTER — Ambulatory Visit (INDEPENDENT_AMBULATORY_CARE_PROVIDER_SITE_OTHER): Payer: 59 | Admitting: Vascular Surgery

## 2014-08-26 ENCOUNTER — Ambulatory Visit (HOSPITAL_COMMUNITY)
Admission: RE | Admit: 2014-08-26 | Discharge: 2014-08-26 | Disposition: A | Discharge status: Home / Self Care | Payer: 59 | Source: Ambulatory Visit | Attending: Vascular Surgery | Admitting: Vascular Surgery

## 2014-08-26 DIAGNOSIS — I83813 Varicose veins of bilateral lower extremities with pain: Secondary | ICD-10-CM | POA: Diagnosis not present

## 2014-08-26 HISTORY — DX: Varicose veins of bilateral lower extremities with pain: I83.813

## 2014-08-26 NOTE — Progress Notes (Signed)
[facility] [place]   Reason for referral: Chronic painful legs  History of Present Illness  [name] is a 59 y.o. female who presents with chief complaint: painful legs.  Patient notes, onset of pain  3 years ago, associated with increased activity .  The patient has had a  history of DVT, has history of varicose vein, no history of venous stasis ulcers, no history of  Lymphedema and no history of skin changes in lower legs.  There is no family history of venous disorders.  The patient has used compression stockings for the past 3 years that were prescribed by her primary care doctor.  Past Medical History  Diagnosis Date  . Hypertension   . Leg DVT (deep venous thromboembolism), acute   . Uterine fibroid   . Cancer 08/03/2010  . DVT (deep venous thrombosis)     Past Surgical History  Procedure Laterality Date  . Gallbladder surgery    . Colon surgery    . Cholecystectomy    . Abdominal hysterectomy      History   Social History  . Marital Status: Married    Spouse Name: N/A    Number of Children: N/A  . Years of Education: N/A   Occupational History  . Not on file.   Social History Main Topics  . Smoking status: Never Smoker   . Smokeless tobacco: Never Used  . Alcohol Use: No  . Drug Use: No  . Sexual Activity: Not on file   Other Topics Concern  . Not on file   Social History Narrative    Family History  Problem Relation Age of Onset  . Hypertension Father     Current Outpatient Prescriptions on File Prior to Visit  Medication Sig Dispense Refill  . amLODipine (NORVASC) 5 MG tablet Take 1 tablet (5 mg total) by mouth daily. 90 tablet 3  . hydrochlorothiazide (HYDRODIURIL) 25 MG tablet Take 1 tablet (25 mg total) by mouth daily. 90 tablet 3  . lisinopril (PRINIVIL,ZESTRIL) 40 MG tablet Take 1 tablet (40 mg total) by mouth daily. 90 tablet 3   No current facility-administered medications on file prior to visit.     Allergies as of 08/26/2014  . (No Known Allergies)    ROS:   General:  No weight loss, Fever, chills  HEENT: No recent headaches, no nasal bleeding, no visual changes, no sore throat  Neurologic: No dizziness, blackouts, seizures. No recent symptoms of stroke or mini- stroke. No recent episodes of slurred speech, or temporary blindness.  Cardiac: No recent episodes of chest pain/pressure, no shortness of breath at rest.  No shortness of breath with exertion.  Denies history of atrial fibrillation or irregular heartbeat  Vascular: No history of rest pain in feet.  No history of claudication.  No history of non-healing ulcer, No history of DVT   Pulmonary: No home oxygen, no productive cough, no hemoptysis,  No asthma or wheezing  Musculoskeletal:  [ ]  Arthritis, [ ]  Low back pain,  [ ]  Joint pain  Hematologic:No history of hypercoagulable state.  No history of easy bleeding.  No history of anemia  Gastrointestinal: No hematochezia or melena,  No gastroesophageal reflux, no trouble swallowing  Urinary: [ ]  chronic Kidney disease, [ ]  on HD - [ ]  MWF or [ ]  TTHS, [ ]  Burning with urination, [ ]  Frequent urination, [ ]  Difficulty urinating;   Skin: No rashes  Psychological: No history of anxiety,  No history  of depression   Physical Examination  Filed Vitals:   08/26/14 1433  BP: 157/83  Pulse: 71  Height: 5\' 7"  (1.702 m)  Weight: 202 lb 12.8 oz (91.989 kg)  SpO2: 98%    Body mass index is 31.76 kg/(m^2).  General:  Alert and oriented, no acute distress HEENT: Normal Neck: No bruit or JVD Pulmonary: Clear to auscultation bilaterally Cardiac: Regular Rate and Rhythm without murmur Abdomen: Soft, non-tender, non-distended, no mass, no scars Skin: No rash Extremity Pulses:  2+ radial, brachial, femoral, dorsalis pedis, posterior tibial pulses bilaterally Musculoskeletal: No deformity or edema  Neurologic: Upper and lower extremity motor 5/5 and  symmetric Psychiatric: Judgment intact, Mood & affect appropriate for pt's clinical situation Lymph : No Cervical, Axillary, or Inguinal lymphadenopathy   DATA: Lower extremity venous duplex   Left GSV mid calf below the knee 0.1 cm vessel minimal reflux. No reflux noted on the right No DVT noted  Assessment: B Small varicose veins with minimal reflux in L calf  Plan: She has excellent palpable pulses and very minimal reflux in a small segment of the GSV.  This does not seem to be contributing to her leg pain.  She can continue wearing the compression hose on a daily basis.   Continue activity as tolerates  She was seen today in conjunction with Dr. Cloyde Reams, [place] PA-C [facility] [place] Office: [phone]  Addendum  I have independently interviewed and examined the patient, and I agree with the physician assistant's findings.  Pt has minimal varicose veins.  She complains of pain in the distribution of the knee and calf L GSV but I don't see any large varicosities.  I suspect a non-vascular etiology is responsible for this patient's sx.   Adele Barthel, MD [facility] [place] Office: [phone] Pager: [phone]  08/26/2014, 5:10 PM

## 2014-10-21 ENCOUNTER — Telehealth: Payer: Self-pay | Admitting: Hematology

## 2014-10-21 NOTE — Telephone Encounter (Signed)
Left message to confirm appointment changed from 12/29 to 01/05. Mailed calendar.

## 2015-01-12 ENCOUNTER — Encounter: Payer: Self-pay | Admitting: *Deleted

## 2015-05-15 ENCOUNTER — Telehealth: Payer: Self-pay | Admitting: Family Medicine

## 2015-05-15 NOTE — Telephone Encounter (Signed)
Left a message for patient to return call to schedule a mammogram.

## 2015-06-30 ENCOUNTER — Other Ambulatory Visit: Payer: Self-pay | Admitting: Physician Assistant

## 2015-07-12 ENCOUNTER — Encounter (HOSPITAL_COMMUNITY): Payer: Self-pay

## 2015-07-12 ENCOUNTER — Emergency Department (HOSPITAL_COMMUNITY)
Admission: EM | Admit: 2015-07-12 | Discharge: 2015-07-12 | Disposition: A | Discharge status: Home / Self Care | Payer: 59 | Attending: Emergency Medicine | Admitting: Emergency Medicine

## 2015-07-12 DIAGNOSIS — N3001 Acute cystitis with hematuria: Secondary | ICD-10-CM | POA: Diagnosis not present

## 2015-07-12 DIAGNOSIS — Z86018 Personal history of other benign neoplasm: Secondary | ICD-10-CM | POA: Insufficient documentation

## 2015-07-12 DIAGNOSIS — Z859 Personal history of malignant neoplasm, unspecified: Secondary | ICD-10-CM | POA: Diagnosis not present

## 2015-07-12 DIAGNOSIS — I10 Essential (primary) hypertension: Secondary | ICD-10-CM | POA: Insufficient documentation

## 2015-07-12 DIAGNOSIS — Z86718 Personal history of other venous thrombosis and embolism: Secondary | ICD-10-CM | POA: Insufficient documentation

## 2015-07-12 DIAGNOSIS — Z79899 Other long term (current) drug therapy: Secondary | ICD-10-CM | POA: Insufficient documentation

## 2015-07-12 DIAGNOSIS — N939 Abnormal uterine and vaginal bleeding, unspecified: Secondary | ICD-10-CM | POA: Diagnosis present

## 2015-07-12 LAB — BASIC METABOLIC PANEL
Anion gap: 7 (ref 5–15)
BUN: 16 mg/dL (ref 6–20)
CALCIUM: 9.6 mg/dL (ref 8.9–10.3)
CHLORIDE: 105 mmol/L (ref 101–111)
CO2: 30 mmol/L (ref 22–32)
CREATININE: 0.94 mg/dL (ref 0.44–1.00)
Glucose, Bld: 118 mg/dL — ABNORMAL HIGH (ref 65–99)
Potassium: 3.4 mmol/L — ABNORMAL LOW (ref 3.5–5.1)
SODIUM: 142 mmol/L (ref 135–145)

## 2015-07-12 LAB — URINALYSIS, ROUTINE W REFLEX MICROSCOPIC
Bilirubin Urine: NEGATIVE
Glucose, UA: NEGATIVE mg/dL
Ketones, ur: NEGATIVE mg/dL
Nitrite: NEGATIVE
Protein, ur: 100 mg/dL — AB
SPECIFIC GRAVITY, URINE: 1.013 (ref 1.005–1.030)
pH: 7.5 (ref 5.0–8.0)

## 2015-07-12 LAB — CBC WITH DIFFERENTIAL/PLATELET
BASOS PCT: 0 %
Basophils Absolute: 0 10*3/uL (ref 0.0–0.1)
EOS ABS: 0.1 10*3/uL (ref 0.0–0.7)
EOS PCT: 1 %
HCT: 41.2 % (ref 36.0–46.0)
Hemoglobin: 13.9 g/dL (ref 12.0–15.0)
LYMPHS ABS: 1.4 10*3/uL (ref 0.7–4.0)
Lymphocytes Relative: 14 %
MCH: 31.1 pg (ref 26.0–34.0)
MCHC: 33.7 g/dL (ref 30.0–36.0)
MCV: 92.2 fL (ref 78.0–100.0)
MONO ABS: 0.5 10*3/uL (ref 0.1–1.0)
MONOS PCT: 5 %
NEUTROS PCT: 80 %
Neutro Abs: 8 10*3/uL — ABNORMAL HIGH (ref 1.7–7.7)
PLATELETS: 175 10*3/uL (ref 150–400)
RBC: 4.47 MIL/uL (ref 3.87–5.11)
RDW: 12.7 % (ref 11.5–15.5)
WBC: 10 10*3/uL (ref 4.0–10.5)

## 2015-07-12 LAB — URINE MICROSCOPIC-ADD ON

## 2015-07-12 MED ORDER — CEPHALEXIN 500 MG PO CAPS: 500.0000 mg | ORAL_CAPSULE | Freq: Four times a day (QID) | ORAL | Status: DC

## 2015-07-12 NOTE — Discharge Instructions (Signed)
You have a bladder infection today. Please take antibiotics as prescribed. Return without fail for worsening symptoms including severe back pain, severe abdominal pain, fevers, vomiting unable to keep down food or fluids, or any other symptoms concerning to you. Please see your primary care physician to make sure your symptoms are improving on the antibiotics. If you continue to have bleeding in your urine after treatment of your infection, you will need to follow-up with urologist.  Urinary Tract Infection A urinary tract infection (UTI) can occur any place along the urinary tract. The tract includes the kidneys, ureters, bladder, and urethra. A type of germ called bacteria often causes a UTI. UTIs are often helped with antibiotic medicine.  HOME CARE   If given, take antibiotics as told by your doctor. Finish them even if you start to feel better.  Drink enough fluids to keep your pee (urine) clear or pale yellow.  Avoid tea, drinks with caffeine, and bubbly (carbonated) drinks.  Pee often. Avoid holding your pee in for a long time.  Pee before and after having sex (intercourse).  Wipe from front to back after you poop (bowel movement) if you are a woman. Use each tissue only once. GET HELP RIGHT AWAY IF:   You have back pain.  You have lower belly (abdominal) pain.  You have chills.  You feel sick to your stomach (nauseous).  You throw up (vomit).  Your burning or discomfort with peeing does not go away.  You have a fever.  Your symptoms are not better in 3 days. MAKE SURE YOU:   Understand these instructions.  Will watch your condition.  Will get help right away if you are not doing well or get worse.   This information is not intended to replace advice given to you by your health care provider. Make sure you discuss any questions you have with your health care provider.   Document Released: 01/15/2008 Document Revised: 08/19/2014 Document Reviewed:  02/27/2012 Elsevier Interactive Patient Education [employer].

## 2015-07-12 NOTE — ED Notes (Signed)
Patient c/o vaginal vaginal bleeding (spotting) that started at 0515am.  Patient had hysterectomy 11 years ago.  Patient states that pain feels like a pressure in her pelvic area and increases when voiding. Patient states that her pain is 10/10.  Breathing even and unlabored, patient NAD at this time.

## 2015-07-12 NOTE — ED Provider Notes (Signed)
CSN: [id number]     Arrival date & time 07/12/15  0906 History   First MD Initiated Contact with Patient 07/12/15 [phone]     Chief Complaint  Patient presents with  . Vaginal Bleeding     (Consider location/radiation/quality/duration/timing/severity/associated sxs/prior Treatment) HPI  59 year old female, not tight anticoagulation, who presents with possible vaginal bleeding  Versus hematuria. History of DVT, but not on blood thinners currently. History of total abdominal hysterectomy and left nephrectomy. States that she has been in her usual state of health. This morning developed urinary frequency with dysuria and suprapubic pressure. States she saw blood in the toilet bowel and had diluted blood after wiping with toilet paper. Concerned for vaginal bleeding and presents to ED for evaluation. No fevers, chills, nausea/vomiting, flank pain, abdominal pain, vaginal discharge.   Past Medical History  Diagnosis Date  . Hypertension   . Leg DVT (deep venous thromboembolism), acute ([place])   . Uterine fibroid   . Cancer ([place]) 08/03/2010  . DVT (deep venous thrombosis) [facility])    Past Surgical History  Procedure Laterality Date  . Gallbladder surgery    . Colon surgery    . Cholecystectomy    . Abdominal hysterectomy     Family History  Problem Relation Age of Onset  . Hypertension Father    Social History  Substance Use Topics  . Smoking status: Never Smoker   . Smokeless tobacco: Never Used  . Alcohol Use: No   OB History    No data available     Review of Systems 10/14 systems reviewed and are negative other than those stated in the HPI    Allergies  Review of patient's allergies indicates no known allergies.  Home Medications   Prior to Admission medications   Medication Sig Start Date End Date Taking? Authorizing Provider  chlorhexidine (PERIDEX) 0.12 % solution 5 mLs by Mouth Rinse route 2 (two) times daily. 06/14/15  Yes Historical Provider, MD  clindamycin  (CLEOCIN) 150 MG capsule Take 150 mg by mouth every 6 (six) hours. 06/26/15  Yes Historical Provider, MD  hydrochlorothiazide (HYDRODIURIL) 25 MG tablet TAKE ONE TABLET BY MOUTH ONE TIME DAILY  "OFFICE VISIT NEEDED FOR REFILLS" 06/30/15  Yes Todd McVeigh, PA  amLODipine (NORVASC) 5 MG tablet Take 1 tablet (5 mg total) by mouth daily. 06/20/14   Todd McVeigh, PA  cephALEXin (KEFLEX) 500 MG capsule Take 1 capsule (500 mg total) by mouth 4 (four) times daily. 07/12/15   Forde Dandy, MD  lisinopril (PRINIVIL,ZESTRIL) 40 MG tablet Take 1 tablet (40 mg total) by mouth daily. 06/20/14   Todd McVeigh, PA   BP 176/94 mmHg  Pulse 89  Temp(Src) 99.4 F (37.4 C) (Oral)  Resp 18  Wt 185 lb (83.915 kg)  SpO2 100% Physical Exam Physical Exam  Nursing note and vitals reviewed. Constitutional: Well developed, well nourished, non-toxic, and in no acute distress Head: Normocephalic and atraumatic.  Mouth/Throat: Oropharynx is clear and moist.  Neck: Normal range of motion. Neck supple.  Cardiovascular: Normal rate and regular rhythm.   Pulmonary/Chest: Effort normal and breath sounds normal.  Abdominal: Soft. There is no tenderness, but states there is increased pressure over suprapubic abdomen. No CVA tenderness. There is no rebound and no guarding.  Pelvic: NO lacerations, lesions or active bleeding noted on speculum exam. Musculoskeletal: Normal range of motion.  Neurological: Alert, no facial droop, fluent speech, moves all extremities symmetrically Skin: Skin is warm and dry.  Psychiatric: Cooperative  ED Course  Procedures (including critical care time) Labs Review Labs Reviewed  CBC WITH DIFFERENTIAL/PLATELET - Abnormal; Notable for the following:    Neutro Abs 8.0 (*)    All other components within normal limits  BASIC METABOLIC PANEL - Abnormal; Notable for the following:    Potassium 3.4 (*)    Glucose, Bld 118 (*)    All other components within normal limits  URINALYSIS, ROUTINE W  REFLEX MICROSCOPIC (NOT AT [facility]) - Abnormal; Notable for the following:    Color, Urine RED (*)    APPearance TURBID (*)    Hgb urine dipstick LARGE (*)    Protein, ur 100 (*)    Leukocytes, UA LARGE (*)    All other components within normal limits  URINE MICROSCOPIC-ADD ON - Abnormal; Notable for the following:    Squamous Epithelial / LPF 0-5 (*)    Bacteria, UA RARE (*)    All other components within normal limits  URINE CULTURE    Imaging Review No results found. I have personally reviewed and evaluated these images and lab results as part of my medical decision-making.   MDM   Final diagnoses:  Acute cystitis with hematuria     59 year old female who presents with dysuria, urinary frequency, and hematuria. Well-appearing and in no acute distress. Vital signs are non- concerning. She has a soft and non-tender abdomen. No CVA tenderness is noted. UA is suggestive of infection, with hematuria, 3-30 wbc's, leukocyte esterase, and bacteria. This is sent for culture. No signs or symptoms of systemic illness. No evidence of vaginal bleeding on exam. Is given a course of antibiotics. Discussed urology follow-up if hematuria persists despite treatment for cystitis. Discussed strict return and follow-up instructions. She expressed understanding of all discharge instructions and felt comfortable with the plan of care.     Forde Dandy, MD 07/12/15 [phone]

## 2015-07-12 NOTE — ED Notes (Signed)
Pt denies hormone therapy.

## 2015-07-14 LAB — URINE CULTURE: Culture: >=100000

## 2015-07-16 ENCOUNTER — Telehealth (HOSPITAL_COMMUNITY): Payer: Self-pay

## 2015-07-16 NOTE — Telephone Encounter (Signed)
Post ED Visit - Positive Culture Follow-up  Culture report reviewed by antimicrobial stewardship pharmacist:  '[]'$  Elenor Quinones, Pharm.D. '[]'$  Heide Guile, Pharm.D., BCPS '[x]'$  Parks Neptune, Pharm.D. '[]'$  Alycia Rossetti, Pharm.D., BCPS '[]'$  Fenwick, Pharm.D., BCPS, AAHIVP '[]'$  Legrand Como, Pharm.D., BCPS, AAHIVP '[]'$  Milus Glazier, Pharm.D. '[]'$  Stephens November, Pharm.D.  Positive urine culture, >/= 100,000 colonies -> E Coli Treated with Cephalexin, organism sensitive to the same and no further patient follow-up is required at this time.  Dortha Kern 07/16/2015, 5:06 AM

## 2015-08-09 ENCOUNTER — Ambulatory Visit: Payer: 59 | Admitting: Hematology

## 2015-08-09 ENCOUNTER — Other Ambulatory Visit: Payer: 59

## 2015-08-10 ENCOUNTER — Ambulatory Visit: Payer: Self-pay | Admitting: Hematology

## 2015-08-10 ENCOUNTER — Other Ambulatory Visit: Payer: No Typology Code available for payment source

## 2015-08-16 ENCOUNTER — Telehealth: Payer: Self-pay | Admitting: Family Medicine

## 2015-08-16 NOTE — Telephone Encounter (Signed)
SPOKE WITH PATIENTS HUSBAND AND SCHEDULED APPOINTMENT FOR 08/23/15 FOR HYPERTENSION AND MED REFILLS

## 2015-08-17 ENCOUNTER — Telehealth: Payer: Self-pay | Admitting: Hematology

## 2015-08-17 ENCOUNTER — Encounter: Payer: Self-pay | Admitting: Hematology

## 2015-08-17 ENCOUNTER — Other Ambulatory Visit: Payer: Self-pay

## 2015-08-17 ENCOUNTER — Other Ambulatory Visit: Payer: Self-pay | Admitting: *Deleted

## 2015-08-17 DIAGNOSIS — Z85038 Personal history of other malignant neoplasm of large intestine: Secondary | ICD-10-CM | POA: Insufficient documentation

## 2015-08-17 HISTORY — DX: Personal history of other malignant neoplasm of large intestine: Z85.038

## 2015-08-17 NOTE — Progress Notes (Signed)
This encounter was created in error - please disregard.

## 2015-08-17 NOTE — Telephone Encounter (Signed)
per pof tos ch pt appt-cld pt and left voicemail of time & date of appt

## 2015-08-23 ENCOUNTER — Encounter (INDEPENDENT_AMBULATORY_CARE_PROVIDER_SITE_OTHER): Payer: Self-pay | Admitting: Family Medicine

## 2015-08-23 DIAGNOSIS — Z1231 Encounter for screening mammogram for malignant neoplasm of breast: Secondary | ICD-10-CM

## 2015-08-23 NOTE — Patient Instructions (Signed)
We recommend that you schedule a mammogram for breast cancer screening. Typically, you do not need a referral to do this. Please contact a local imaging center to schedule your mammogram.  [facility] - [phone]  *ask for the Radiology Department [facility]) - [phone] or [phone]  [facility] - [phone] [place] [phone] [facility] - [phone]  *ask for the [facility] - [phone]  *ask for the Radiology Department [facility] - [phone]  *ask for the [facility] - [phone]

## 2015-08-24 NOTE — Progress Notes (Signed)
Error- pt canceled appt

## 2015-09-01 ENCOUNTER — Encounter: Payer: Self-pay | Admitting: Hematology

## 2015-09-01 ENCOUNTER — Ambulatory Visit (HOSPITAL_BASED_OUTPATIENT_CLINIC_OR_DEPARTMENT_OTHER): Payer: 59 | Admitting: Hematology

## 2015-09-01 ENCOUNTER — Other Ambulatory Visit (HOSPITAL_BASED_OUTPATIENT_CLINIC_OR_DEPARTMENT_OTHER): Payer: 59

## 2015-09-01 ENCOUNTER — Encounter: Payer: Self-pay | Admitting: Family Medicine

## 2015-09-01 VITALS — BP 150/81 | HR 64 | Temp 98.5°F | Resp 18 | Ht 67.0 in | Wt 201.1 lb

## 2015-09-01 DIAGNOSIS — Z85038 Personal history of other malignant neoplasm of large intestine: Secondary | ICD-10-CM

## 2015-09-01 DIAGNOSIS — Z86718 Personal history of other venous thrombosis and embolism: Secondary | ICD-10-CM

## 2015-09-01 DIAGNOSIS — C186 Malignant neoplasm of descending colon: Secondary | ICD-10-CM

## 2015-09-01 LAB — COMPREHENSIVE METABOLIC PANEL
ALBUMIN: 4.3 g/dL (ref 3.5–5.0)
ALK PHOS: 89 U/L (ref 40–150)
ALT: 22 U/L (ref 0–55)
AST: 19 U/L (ref 5–34)
Anion Gap: 10 mEq/L (ref 3–11)
BILIRUBIN TOTAL: 0.55 mg/dL (ref 0.20–1.20)
BUN: 22 mg/dL (ref 7.0–26.0)
CO2: 28 mEq/L (ref 22–29)
CREATININE: 1.1 mg/dL (ref 0.6–1.1)
Calcium: 9.3 mg/dL (ref 8.4–10.4)
Chloride: 106 mEq/L (ref 98–109)
EGFR: 53 mL/min/{1.73_m2} — AB (ref >90)
Glucose: 129 mg/dl (ref 70–140)
Potassium: 3.6 mEq/L (ref 3.5–5.1)
SODIUM: 144 meq/L (ref 136–145)
TOTAL PROTEIN: 6.9 g/dL (ref 6.4–8.3)

## 2015-09-01 LAB — CBC WITH DIFFERENTIAL/PLATELET
BASO%: 1 % (ref 0.0–2.0)
Basophils Absolute: 0 10*3/uL (ref 0.0–0.1)
EOS ABS: 0.1 10*3/uL (ref 0.0–0.5)
EOS%: 1.6 % (ref 0.0–7.0)
HCT: 39.4 % (ref 34.8–46.6)
HEMOGLOBIN: 13.1 g/dL (ref 11.6–15.9)
LYMPH%: 44.5 % (ref 14.0–49.7)
MCH: 30.5 pg (ref 25.1–34.0)
MCHC: 33.2 g/dL (ref 31.5–36.0)
MCV: 92.1 fL (ref 79.5–101.0)
MONO#: 0.3 10*3/uL (ref 0.1–0.9)
MONO%: 7 % (ref 0.0–14.0)
NEUT%: 45.9 % (ref 38.4–76.8)
NEUTROS ABS: 2.3 10*3/uL (ref 1.5–6.5)
PLATELETS: 170 10*3/uL (ref 145–400)
RBC: 4.28 10*6/uL (ref 3.70–5.45)
RDW: 12.7 % (ref 11.2–14.5)
WBC: 4.9 10*3/uL (ref 3.9–10.3)
lymph#: 2.2 10*3/uL (ref 0.9–3.3)

## 2015-09-01 NOTE — Progress Notes (Signed)
[place], [place] [address]  DIAGNOSIS: Cancer of left colon ([place])  CHIEF COMPLAIN: follow up   CURRENT THERAPY: Observation.   PROBLEM LIST: 1. Status post low anterior resection/partial colectomy on 08/03/2010 for a 6 cm mildly differentiated rectosigmoid adenocarcinoma. None of 16 lymph nodes sampled had evidence of malignancy. Chemotherapy was not recommended. Her last colonoscopy was on 05/30/2011 and showed an anastomosis at 7 cm from the anal verge and large hemorrhoids. Repeat colonoscopy in 3 years.  2. HTN  3. Left lower extremity DVT (01/2004) s/p Coumadin therapy  4. Status post TAH with a history of uterine fibroids  5. Mammogram in 2012, she does not want screening mammogram due to the concern of x-ray exposure.   INTERVAL HISTORY: [name] 60 y.o. female with a history of early stage colon cancer and DVT is here for follow-up. Patient was last seen by Dr. Marvis Repress one year ago.    She is doing very well overall.  She denies a pain, nausea, abdominal discomfort, change of her bowel habits. No hematochezia no melena. she has good appetite and energy level. No other medical events in the past year.   MEDICAL HISTORY: Past Medical History  Diagnosis Date  . Hypertension   . Leg DVT (deep venous thromboembolism), acute   . Uterine fibroid   . Cancer 08/03/2010    INTERIM HISTORY: has DVT (deep venous thrombosis) ([place]); Cancer of left colon ([place]); HTN (hypertension); Varicose 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; and History of colon cancer, stage I on her problem list.    ALLERGIES:  has No Known Allergies.  MEDICATIONS: has a current medication list which includes the following prescription(s): amlodipine, hydrochlorothiazide, and lisinopril.  SURGICAL HISTORY:  Past Surgical History  Procedure Laterality Date  . Gallbladder surgery    . Colon surgery    . Cholecystectomy    .  Abdominal hysterectomy       REVIEW OF SYSTEMS:   Constitutional: Denies fevers, chills or abnormal weight loss Eyes: Denies blurriness of vision Ears, nose, mouth, throat, and face: Denies mucositis or sore throat Respiratory: Denies cough, dyspnea or wheezes Cardiovascular: Denies palpitation, chest discomfort or lower extremity swelling Gastrointestinal:  Denies nausea, heartburn or change in bowel habits Skin: Denies abnormal skin rashes Lymphatics: Denies new lymphadenopathy or easy bruising Neurological:Denies numbness, tingling or new weaknesses Behavioral/Psych: Mood is stable, no new changes  All other systems were reviewed with the patient and are negative.  PHYSICAL EXAMINATION: ECOG PERFORMANCE STATUS: 0 - Asymptomatic  Blood pressure 150/81, pulse 64, temperature 98.5 F (36.9 C), temperature source Oral, resp. rate 18, height '5\' 7"'$  (1.702 m), weight 201 lb 1.6 oz (91.218 kg), SpO2 100 %.  GENERAL:alert, no distress and comfortable; well developed, well nourished.  SKIN: skin color, texture, turgor are normal, no rashes or significant lesions EYES: normal, Conjunctiva are pink and non-injected, sclera clear OROPHARYNX:no exudate, no erythema and lips, buccal mucosa, and tongue normal  NECK: supple, thyroid normal size, non-tender, without nodularity LYMPH:  no palpable lymphadenopathy in the cervical, axillary or supraclavicular and groin area  LUNGS: clear to auscultation and percussion with normal breathing effort HEART: regular rate & rhythm and no murmurs and no lower extremity edema ABDOMEN:abdomen soft, non-tender and normal bowel sounds; abdominal scar well healed.  Musculoskeletal:no cyanosis of digits and no clubbing  NEURO: alert & oriented x 3 with fluent speech, no focal motor/sensory deficits   LABORATORY  DATA: CBC Latest Ref Rng 09/01/2015 07/12/2015 08/08/2014  WBC 3.9 - 10.3 10e3/uL 4.9 10.0 4.1  Hemoglobin 11.6 - 15.9 g/dL 13.1 13.9 13.0    Hematocrit 34.8 - 46.6 % 39.4 41.2 38.9  Platelets 145 - 400 10e3/uL 170 175 164    CMP Latest Ref Rng 09/01/2015 07/12/2015 08/08/2014  Glucose 70 - 140 mg/dl 129 118(H) 93  BUN 7.0 - 26.0 mg/dL 22.0 16 18.7  Creatinine 0.6 - 1.1 mg/dL 1.1 0.94 0.9  Sodium 136 - 145 mEq/L 144 142 142  Potassium 3.5 - 5.1 mEq/L 3.6 3.4(L) 3.6  Chloride 101 - 111 mmol/L - 105 -  CO2 22 - 29 mEq/L '28 30 28  '$ Calcium 8.4 - 10.4 mg/dL 9.3 9.6 9.1  Total Protein 6.4 - 8.3 g/dL 6.9 - 6.6  Total Bilirubin 0.20 - 1.20 mg/dL 0.55 - 0.48  Alkaline Phos 40 - 150 U/L 89 - 92  AST 5 - 34 U/L 19 - 22  ALT 0 - 55 U/L 22 - 23    CEA (Order [id number])      CEA  Status: Finalresult Visible to patient:  MyChart Nextappt: 08/26/2014 at 01:30 PM in Radiology ([facility]) Dx:  Colon cancer           Ref Range 14moago (01/26/14) 153yrgo (06/28/13) 1y27yro (03/11/13)    CEA 0.0 - 5.0 ng/mL <0.5 <0.5 <0.5        RADIOGRAPHIC STUDIES: No results found.  ASSESSMENT: Yiselle Ganser [age]o. female with a history of Cancer of left colon [place]:  1. History of Colon Cancer, pT2N0M0, stage I  --She is status post low anterior resection/partial colectomy on 08/03/2010 for a 6 cm mildly differentiated rectosigmoid adenocarcinoma. None of the 16 lymph nodes sampled had evidence of malignancy. Chemotherapy was not recommended. - she has completed 5 years of cancer surveillance, the risk of cancer recurrence  Is minimal now. -She is doing well without any evidence of cancer recurrence. CT scan done on 03/10/2013 showed no evidence of disease. Her CEA level has been normal, today's CEA is still PEnding. I'll call her in a few days with the results. - she is overdue for colonoscopy, she agrees with a repeated colonoscopy. I'll contact her  Gastroenterologist Dr. HunBenson Norway. Cancer screening -I encouraged her to get annual mammogram for rest cancer screening. Her last mammogram was in 2012.  However  she is concerned about x-ray exposure.  She declined for now.  3. History of DVT -No clinical evidence of recurrence.  -Continue observation. Strategies to reduce risk of DVT was discussed with her.  Follow-up:  She will follow-up with her primary care physician. I'll only see her as needed in future. I sent a message to Dr. HunBenson Norwayr  Repeated colonoscopy.  I spent 10 minutes counseling the patient face to face. The total time spent in the appointment was 15 minutes.    FenTruitt MerleD 09/01/2015 3:39 PM

## 2015-09-02 LAB — CEA: CEA1: 0.5 ng/mL (ref 0.0–4.7)

## 2015-09-04 ENCOUNTER — Other Ambulatory Visit: Payer: Self-pay | Admitting: Physician Assistant

## 2015-09-05 ENCOUNTER — Telehealth: Payer: Self-pay | Admitting: *Deleted

## 2015-09-05 NOTE — Telephone Encounter (Signed)
Called pt at home and left message on voice mail re:  Tumor marker CEA level was normal from labs drawn 09/01/15 - as per Dr. Ernestina Penna instructions.

## 2015-09-06 ENCOUNTER — Encounter: Payer: Self-pay | Admitting: Family Medicine

## 2015-09-23 ENCOUNTER — Ambulatory Visit (INDEPENDENT_AMBULATORY_CARE_PROVIDER_SITE_OTHER): Payer: 59 | Admitting: Family Medicine

## 2015-09-23 VITALS — BP 152/84 | HR 68 | Temp 98.7°F | Resp 18 | Ht 67.0 in | Wt 198.2 lb

## 2015-09-23 DIAGNOSIS — M25571 Pain in right ankle and joints of right foot: Secondary | ICD-10-CM | POA: Diagnosis not present

## 2015-09-23 DIAGNOSIS — I10 Essential (primary) hypertension: Secondary | ICD-10-CM | POA: Diagnosis not present

## 2015-09-23 MED ORDER — AMLODIPINE BESYLATE 5 MG PO TABS: 5.0000 mg | ORAL_TABLET | Freq: Every day | ORAL | Status: DC

## 2015-09-23 MED ORDER — HYDROCHLOROTHIAZIDE 25 MG PO TABS: ORAL_TABLET | ORAL | Status: DC

## 2015-09-23 MED ORDER — LISINOPRIL 40 MG PO TABS: 40.0000 mg | ORAL_TABLET | Freq: Every day | ORAL | Status: DC

## 2015-09-23 NOTE — Progress Notes (Signed)
[facility] [address], [place] [address] [phone]  Date:  02/15/2375   Name:  [name]   DOB:  04-12-1956   MRN:  [id number]  PCP:  Lamar Blinks, MD    Chief Complaint: Ankle Pain   History of Present Illness:  [name] is a 60 y.o. very pleasant female patient who presents with the following:  Here today for a follow-up visit.  Last seen by myself in 2015 She has been doing ok overall, was told that she needed to come in to get further refills She has a history of DVT x2- most recently about 10 years ago.   She has noted a swelling in her right ankle and some pain on the medial ankle over the last 2 weeks. She did not injury the ankle in any way- wants to be sure she does not have a recurrent clot  She saw her oncologist recently and had labs- they looked fine.    She also notes some sort of pain in her vulva that occurs on occasion only when she is sitting down- it will go away if she stands up. She otherwise does not have any abd concerns   Patient Active Problem List   Diagnosis Date Noted  . History of colon cancer, stage I 08/17/2015  . Varicose 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 08/26/2014  . HTN (hypertension) 12/24/2013  . Cancer of left colon ([place]) 02/25/2012  . DVT (deep venous thrombosis) ([place]) 08/27/2011    Past Medical History  Diagnosis Date  . Hypertension   . Leg DVT (deep venous thromboembolism), acute ([place])   . Uterine fibroid   . Cancer ([place]) 08/03/2010  . DVT (deep venous thrombosis) [facility])     Past Surgical History  Procedure Laterality Date  . Gallbladder surgery    . Colon surgery    . Cholecystectomy    . Abdominal hysterectomy      Social History  Substance Use Topics  . Smoking status: Never Smoker   . Smokeless tobacco: Never Used  . Alcohol Use: No    Family History  Problem Relation Age of Onset  . Hypertension Father     No Known Allergies  Medication list has  been reviewed and updated.  Current Outpatient Prescriptions on File Prior to Visit  Medication Sig Dispense Refill  . amLODipine (NORVASC) 5 MG tablet Take 1 tablet (5 mg total) by mouth daily. 90 tablet 3  . hydrochlorothiazide (HYDRODIURIL) 25 MG tablet TAKE ONE TABLET BY MOUTH ONE TIME DAILY  "OFFICE VISIT NEEDED FOR REFILLS" (Patient not taking: Reported on 09/23/2015) 90 tablet 0  . lisinopril (PRINIVIL,ZESTRIL) 40 MG tablet Take 1 tablet (40 mg total) by mouth daily. (Patient not taking: Reported on 09/23/2015) 90 tablet 3   No current facility-administered medications on file prior to visit.    Review of Systems:  As per HPI- otherwise negative.   Physical Examination: Filed Vitals:   09/23/15 1345  BP: 152/84  Pulse: 68  Temp: 98.7 F (37.1 C)  Resp: 18   Filed Vitals:   09/23/15 1345  Height: '5\' 7"'$  (1.702 m)  Weight: 198 lb 3.2 oz (89.903 kg)   Body mass index is 31.04 kg/(m^2). Ideal Body Weight: Weight in (lb) to have BMI = 25: 159.3  GEN: WDWN, NAD, Non-toxic, A & O x 3, overweight, large build HEENT: Atraumatic, Normocephalic. Neck supple. No masses, No LAD. Ears and Nose: No external deformity. CV: RRR, No  M/G/R. No JVD. No thrill. No extra heart sounds. PULM: CTA B, no wheezes, crackles, rhonchi. No retractions. No resp. distress. No accessory muscle use. ABD: S, NT, ND, +BS. No rebound. No HSM. EXTR: No c/c/e NEURO Normal gait.  PSYCH: Normally interactive. Conversant. Not depressed or anxious appearing.  Calm demeanor.  Gu: normal exam post- hysterectomy with some vaginal dryness and mild atrophy.  Normal vulva and vaginal canal Right ankle: no swelling, she does have a prominent vein on the medial ankle that is a bit tender  Assessment and Plan: Essential hypertension - Plan: amLODipine (NORVASC) 5 MG tablet, hydrochlorothiazide (HYDRODIURIL) 25 MG tablet, lisinopril (PRINIVIL,ZESTRIL) 40 MG tablet  Right ankle pain - Plan: Ultrasound doppler venous  legs bilat  Will send her for an [place] to make sure no clot, but I think that she has some irritation along a superficial vein.  Encouraged OTC NSAID use for this while doppler is pending   Refilled her BP medications as above Reassured that her vaginal exam is normal- her sx may be due to dryness. Suggested that she use a feminine moisturizer as needed   Signed Lamar Blinks, MD

## 2015-10-12 ENCOUNTER — Telehealth: Payer: Self-pay | Admitting: Family Medicine

## 2015-10-12 NOTE — Telephone Encounter (Signed)
Could someone please put another order in for this patient for a u/s doppler  referrals states that it was put in wrong pt husband came in to our office today to see what was the problem i advised husband that someone will give them a call about referral

## 2015-10-13 NOTE — Telephone Encounter (Signed)
If she has not had her [place] from her 2/11 apt I think that it would be best for the patient to RTC because Dr Lorelei Pont thought DVT was less likely and we should re-evaluate the area due to long length of time between her visit and now.

## 2016-05-20 ENCOUNTER — Telehealth: Payer: Self-pay | Admitting: Hematology

## 2016-05-20 NOTE — Telephone Encounter (Signed)
Patient sopped by scheduling to schedule an appointment with Dr. Burr Medico. As per Melissa, 10/20 @ 11:15. 05/20/16

## 2016-05-31 ENCOUNTER — Ambulatory Visit (HOSPITAL_BASED_OUTPATIENT_CLINIC_OR_DEPARTMENT_OTHER): Payer: 59 | Admitting: Hematology

## 2016-05-31 ENCOUNTER — Encounter: Payer: Self-pay | Admitting: Hematology

## 2016-05-31 ENCOUNTER — Telehealth: Payer: Self-pay | Admitting: Hematology

## 2016-05-31 VITALS — BP 173/80 | HR 64 | Temp 98.6°F | Resp 17 | Ht 67.0 in | Wt 195.3 lb

## 2016-05-31 DIAGNOSIS — Z85038 Personal history of other malignant neoplasm of large intestine: Secondary | ICD-10-CM

## 2016-05-31 DIAGNOSIS — Z86718 Personal history of other venous thrombosis and embolism: Secondary | ICD-10-CM

## 2016-05-31 DIAGNOSIS — I10 Essential (primary) hypertension: Secondary | ICD-10-CM | POA: Diagnosis not present

## 2016-05-31 DIAGNOSIS — C186 Malignant neoplasm of descending colon: Secondary | ICD-10-CM

## 2016-05-31 MED ORDER — SODIUM CHLORIDE 0.9 % IV SOLN: INTRAVENOUS | Status: AC

## 2016-05-31 NOTE — Progress Notes (Signed)
[place], [address] [place] [address]  DIAGNOSIS: Cancer of left colon [facility])  Essential hypertension  History of DVT of lower extremity  CHIEF COMPLAIN: follow up   CURRENT THERAPY: Observation.   PROBLEM LIST: 1. Status post low anterior resection/partial colectomy on 08/03/2010 for a 6 cm mildly differentiated rectosigmoid adenocarcinoma. None of 16 lymph nodes sampled had evidence of malignancy. Chemotherapy was not recommended. Her last colonoscopy was on 05/30/2011 and showed an anastomosis at 7 cm from the anal verge and large hemorrhoids. Repeat colonoscopy in 3 years.  2. HTN  3. Left lower extremity DVT (01/2004) s/p Coumadin therapy  4. Status post TAH with a history of uterine fibroids  5. Mammogram in 2012, she does not want screening mammogram due to the concern of x-ray exposure.   INTERVAL HISTORY: [name] 60 y.o. female with a history of early stage colon cancer and DVT is here for follow-up. Patient was last seen by me 9 months ago, and she recently requested an appointment to see me.   She complains of intermittent diarrhea, he happens on average once a month, it lasts a few days, with loose bowel movement 4-5 times a day. No significant nausea, occasional abdominal cramps. No change of appetite or weight loss. She brought in a FMLA paperwork, she would like to have a few days off from her work as needed when she has diarrhea. No other new complaints.  MEDICAL HISTORY: Past Medical History  Diagnosis Date  . Hypertension   . Leg DVT (deep venous thromboembolism), acute   . Uterine fibroid   . Cancer 08/03/2010    INTERIM HISTORY: has DVT (deep venous thrombosis) ([place]); Cancer of left colon ([place]); HTN (hypertension); Varicose 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; History of colon cancer, stage I; and History of DVT of lower extremity on her problem list.     ALLERGIES:  has No Known Allergies.  MEDICATIONS: has a current medication list which includes the following prescription(s): amlodipine, hydrochlorothiazide, and lisinopril.  SURGICAL HISTORY:  Past Surgical History:  Procedure Laterality Date  . ABDOMINAL HYSTERECTOMY    . CHOLECYSTECTOMY    . COLON SURGERY    . GALLBLADDER SURGERY       REVIEW OF SYSTEMS:   Constitutional: Denies fevers, chills or abnormal weight loss Eyes: Denies blurriness of vision Ears, nose, mouth, throat, and face: Denies mucositis or sore throat Respiratory: Denies cough, dyspnea or wheezes Cardiovascular: Denies palpitation, chest discomfort or lower extremity swelling Gastrointestinal:  Denies nausea, heartburn or change in bowel habits Skin: Denies abnormal skin rashes Lymphatics: Denies new lymphadenopathy or easy bruising Neurological:Denies numbness, tingling or new weaknesses Behavioral/Psych: Mood is stable, no new changes  All other systems were reviewed with the patient and are negative.  PHYSICAL EXAMINATION: ECOG PERFORMANCE STATUS: 0 - Asymptomatic  Blood pressure (!) 173/80, pulse 64, temperature 98.6 F (37 C), temperature source Oral, resp. rate 17, height '5\' 7"'$  (1.702 m), weight 195 lb 4.8 oz (88.6 kg), SpO2 100 %.  GENERAL:alert, no distress and comfortable; well developed, well nourished.  SKIN: skin color, texture, turgor are normal, no rashes or significant lesions EYES: normal, Conjunctiva are pink and non-injected, sclera clear OROPHARYNX:no exudate, no erythema and lips, buccal mucosa, and tongue normal  NECK: supple, thyroid normal size, non-tender, without nodularity LYMPH:  no palpable lymphadenopathy in the cervical, axillary or supraclavicular and groin area  LUNGS: clear to auscultation  and percussion with normal breathing effort HEART: regular rate & rhythm and no murmurs and no lower extremity edema ABDOMEN:abdomen soft, non-tender and normal bowel sounds;  abdominal scar well healed.  Musculoskeletal:no cyanosis of digits and no clubbing  NEURO: alert & oriented x 3 with fluent speech, no focal motor/sensory deficits   LABORATORY DATA: CBC Latest Ref Rng & Units 09/01/2015 07/12/2015 08/08/2014  WBC 3.9 - 10.3 10e3/uL 4.9 10.0 4.1  Hemoglobin 11.6 - 15.9 g/dL 13.1 13.9 13.0  Hematocrit 34.8 - 46.6 % 39.4 41.2 38.9  Platelets 145 - 400 10e3/uL 170 175 164    CMP Latest Ref Rng & Units 09/01/2015 07/12/2015 08/08/2014  Glucose 70 - 140 mg/dl 129 118(H) 93  BUN 7.0 - 26.0 mg/dL 22.0 16 18.7  Creatinine 0.6 - 1.1 mg/dL 1.1 0.94 0.9  Sodium 136 - 145 mEq/L 144 142 142  Potassium 3.5 - 5.1 mEq/L 3.6 3.4(L) 3.6  Chloride 101 - 111 mmol/L - 105 -  CO2 22 - 29 mEq/L '28 30 28  '$ Calcium 8.4 - 10.4 mg/dL 9.3 9.6 9.1  Total Protein 6.4 - 8.3 g/dL 6.9 - 6.6  Total Bilirubin 0.20 - 1.20 mg/dL 0.55 - 0.48  Alkaline Phos 40 - 150 U/L 89 - 92  AST 5 - 34 U/L 19 - 22  ALT 0 - 55 U/L 22 - 23    CEA (Order [id number])      CEA  Status: Finalresult Visible to patient:  MyChart Nextappt: 08/26/2014 at 01:30 PM in Radiology ([facility] US[facility]) Dx:  Colon cancer           Ref Range 44moago (01/26/14) 112yrgo (06/28/13) 1y23yro (03/11/13)    CEA 0.0 - 5.0 ng/mL <0.5 <0.5 <0.5        RADIOGRAPHIC STUDIES: No results found.  ASSESSMENT: Keighley Petter 60 57o. female with a history of Cancer of left colon [place]  History of DVT of lower extremity  PLAN:  1. History of Colon Cancer, pT2N0M0, stage I  --She is status post low anterior resection/partial colectomy on 08/03/2010 for a 6 cm mildly differentiated rectosigmoid adenocarcinoma. None of the 16 lymph nodes sampled had evidence of malignancy. Chemotherapy was not recommended. - she has completed 5 years of cancer surveillance, the risk of cancer recurrence  Is minimal now. -She is doing well without any evidence of cancer recurrence. CT scan done on  03/10/2013 showed no evidence of disease. Her CEA level has been normal -I think her mild intermittent diarrhea is probably related to her prior colon surgery, I'm not concerned about cancer recurrence. - she is overdue for colonoscopy, due to her GI symptoms, I strongly encouraged her to follow-up with Dr. HunBenson Norwayd have a repeated colonoscopy as soon as possible.   2. Cancer screening -I encouraged her to get annual mammogram for rest cancer screening. Her last mammogram was in 2012.  However she is concerned about x-ray exposure.  She declined for now.  3. History of DVT -No clinical evidence of recurrence. She is off anticoagulation  -Continue observation. Strategies to reduce risk of DVT was discussed with her.  Follow-up:  She will follow-up with her primary care physician. I'll only see her as needed in future. I encourage her to contact Dr. HunBenson Norwayr colonoscopy. I encourage her to get her FMLA paper work filled by her PCP.   I spent 15 minutes counseling the patient face to face. The total time spent in the appointment was 20  minutes.    Truitt Merle, MD 05/31/16 11:40 AM

## 2016-05-31 NOTE — Telephone Encounter (Signed)
AVS was printed for patient. No message from LOS to schedule future appointments.

## 2016-06-01 ENCOUNTER — Encounter: Payer: Self-pay | Admitting: Hematology

## 2016-06-03 ENCOUNTER — Ambulatory Visit (HOSPITAL_BASED_OUTPATIENT_CLINIC_OR_DEPARTMENT_OTHER)
Admission: RE | Admit: 2016-06-03 | Discharge: 2016-06-03 | Disposition: A | Discharge status: Home / Self Care | Payer: 59 | Source: Ambulatory Visit | Attending: Family Medicine | Admitting: Family Medicine

## 2016-06-03 ENCOUNTER — Ambulatory Visit (INDEPENDENT_AMBULATORY_CARE_PROVIDER_SITE_OTHER): Payer: 59 | Admitting: Family Medicine

## 2016-06-03 VITALS — BP 158/84 | HR 91 | Temp 98.3°F | Resp 17 | Ht 67.0 in | Wt 199.0 lb

## 2016-06-03 DIAGNOSIS — C186 Malignant neoplasm of descending colon: Secondary | ICD-10-CM

## 2016-06-03 DIAGNOSIS — I10 Essential (primary) hypertension: Secondary | ICD-10-CM | POA: Diagnosis not present

## 2016-06-03 DIAGNOSIS — M79651 Pain in right thigh: Secondary | ICD-10-CM

## 2016-06-03 DIAGNOSIS — M79661 Pain in right lower leg: Secondary | ICD-10-CM

## 2016-06-03 NOTE — Progress Notes (Signed)
Chief Complaint  Patient presents with  . Leg Pain    Right thigh. x2weeks. Hx of blood clot    HPI  Pt reports that she went to the cancer center due to history of colon cancer. She reports that she also had a leg DVT maybe 10 years ago She reports that now she has tenderness superior to the right knee  She reports that it has been there for 2 weeks and is progressively worse She reports that she called her Oncologist who advised that she follow up with her primary care.   Hypertension: Patient here for follow-up of elevated blood pressure. She was noted to have elevated blood pressures and states that she did not take her blood pressure medication today and is feeling anxious. She reports that it hurts to stand and she was not able to go to work BP Readings from Last 3 Encounters:  06/03/16 (!) 158/84  05/31/16 (!) 173/80  09/23/15 (!) 152/84     Past Medical History:  Diagnosis Date  . Cancer ([place]) 08/03/2010  . DVT (deep venous thrombosis) ([place])   . Hypertension   . Leg DVT (deep venous thromboembolism), acute ([place])   . Uterine fibroid     Current Outpatient Prescriptions  Medication Sig Dispense Refill  . amLODipine (NORVASC) 5 MG tablet Take 1 tablet (5 mg total) by mouth daily. 90 tablet 3  . hydrochlorothiazide (HYDRODIURIL) 25 MG tablet TAKE ONE TABLET BY MOUTH ONE TIME DAILY 90 tablet 3  . lisinopril (PRINIVIL,ZESTRIL) 40 MG tablet Take 1 tablet (40 mg total) by mouth daily. 90 tablet 3   No current facility-administered medications for this visit.     Allergies: No Known Allergies  Past Surgical History:  Procedure Laterality Date  . ABDOMINAL HYSTERECTOMY    . CHOLECYSTECTOMY    . COLON SURGERY    . GALLBLADDER SURGERY      Social History   Social History  . Marital status: Married    Spouse name: N/A  . Number of children: N/A  . Years of education: N/A   Social History Main Topics  . Smoking status: Never Smoker  . Smokeless tobacco: Never  Used  . Alcohol use No  . Drug use: No  . Sexual activity: Not Asked   Other Topics Concern  . None   Social History Narrative  . None    ROS  Objective: Vitals:   06/03/16 1653 06/03/16 1704  BP: (!) 142/90 (!) 158/84  Pulse: 91   Resp: 17   Temp: 98.3 F (36.8 C)   TempSrc: Oral   SpO2: 96%   Weight: 199 lb (90.3 kg)   Height: '5\' 7"'$  (1.702 m)     Physical Exam  Constitutional: She appears well-developed and well-nourished.  HENT:  Head: Normocephalic and atraumatic.  Cardiovascular: Normal rate, regular rhythm and normal heart sounds.   Pulmonary/Chest: Effort normal and breath sounds normal. No respiratory distress. She has no wheezes.  Musculoskeletal:       Right upper leg: She exhibits tenderness and edema. She exhibits no bony tenderness and no deformity.       Right lower leg: She exhibits tenderness and edema. She exhibits no bony tenderness and no deformity.       Left lower leg: Normal. She exhibits no tenderness and no swelling.       Right foot: Normal. There is normal range of motion and no swelling.       Left foot: Normal. There is normal  range of motion and no swelling.    Assessment and Plan [place] was seen today for leg pain.  Diagnoses and all orders for this visit:  Pain of right calf Right thigh pain -   Given history of cancer and DVT with 2 week history of progressive calf and thigh pain will get LE ultrasound to rule out DVT Pt will be seen at 8:30pm at Radiology -     VAS [place] LOWER EXTREMITY VENOUS (DVT); Future  Essential hypertension- advised pt to take her bp meds asap and then check blood pressure today Compliance is unclear and would advise her to bring in her home readings as pt has white coat hypertension  Cancer of left colon [facility])- patient is getting repeat evaluation by Oncology for colon cancer.  She will need FMLA Advised her to follow up with her PCP for [place]

## 2016-06-03 NOTE — Patient Instructions (Addendum)
06/03/16 right venous doppler study 830pm. Check in through ER (tell them your outpatient)  [address] [place] [address]  Call Lamar Blinks, MD to get set up for FMLA and to follow up for the calf pain and hypertension.  Please take your medications daily especially before going to the appointment.  If your blood pressure is still high your primary care provider might decide to add on more blood pressure medications.   If you can check your blood pressure at home with a home monitor and bring those readings this would be helpful.    IF you received an x-ray today, you will receive an invoice from [facility] Radiology. Please contact [facility] Radiology at [phone] with questions or concerns regarding your invoice.   IF you received labwork today, you will receive an invoice from [employer]. Please contact [facility] at [phone] with questions or concerns regarding your invoice.   Our billing staff will not be able to assist you with questions regarding bills from these companies.  You will be contacted with the lab results as soon as they are available. The fastest way to get your results is to activate your My Chart account. Instructions are located on the last page of this paperwork. If you have not heard from [place] regarding the results in 2 weeks, please contact this office.

## 2016-06-10 ENCOUNTER — Ambulatory Visit: Payer: 59 | Admitting: Family Medicine

## 2016-06-17 ENCOUNTER — Ambulatory Visit (INDEPENDENT_AMBULATORY_CARE_PROVIDER_SITE_OTHER): Payer: 59 | Admitting: Family Medicine

## 2016-06-17 ENCOUNTER — Ambulatory Visit (HOSPITAL_BASED_OUTPATIENT_CLINIC_OR_DEPARTMENT_OTHER)
Admission: RE | Admit: 2016-06-17 | Discharge: 2016-06-17 | Disposition: A | Discharge status: Home / Self Care | Payer: 59 | Source: Ambulatory Visit | Attending: Family Medicine | Admitting: Family Medicine

## 2016-06-17 VITALS — BP 158/78 | HR 81 | Temp 98.0°F | Ht 67.0 in | Wt 202.2 lb

## 2016-06-17 DIAGNOSIS — I10 Essential (primary) hypertension: Secondary | ICD-10-CM | POA: Diagnosis not present

## 2016-06-17 DIAGNOSIS — M1711 Unilateral primary osteoarthritis, right knee: Secondary | ICD-10-CM | POA: Diagnosis not present

## 2016-06-17 DIAGNOSIS — M1611 Unilateral primary osteoarthritis, right hip: Secondary | ICD-10-CM | POA: Diagnosis not present

## 2016-06-17 DIAGNOSIS — M79604 Pain in right leg: Secondary | ICD-10-CM

## 2016-06-17 DIAGNOSIS — E876 Hypokalemia: Secondary | ICD-10-CM | POA: Diagnosis not present

## 2016-06-17 NOTE — Patient Instructions (Signed)
Please go to the lab and then to the ground floor for x-rays.  I will be in touch with your results Increase your amlodipine to 10 mg (2 of the 5 mg pills) daily to help bring down your blood pressure.   Please see me in about 2 months to check on your blood pressure

## 2016-06-17 NOTE — Progress Notes (Addendum)
[name] at [facility] [address] [place], [place] [address] [phone]  Date:  25/11/2704   Name:  [name]   DOB:  January 31, 1956   MRN:  [id number]  PCP:  Lamar Blinks, MD    Chief Complaint: Leg Pain (Patient wants FMLA papers filled out )   History of Present Illness:  [name] is a 60 y.o. very pleasant female patient who presents with the following:  Last seen by myself in February of this year.   She was seen at [facility] about 2 weeks ago with right calf pain- [place] on 10/23 was negative for DVT.   She still has the right calf pain- we are not sure of the cause of her pain.  She has noted this for about 2 months.  NKI It does seem to be getting worse, and is causing her problems at work. She works at [employer] on [address] and is having a hard time standing and walking her her entire shift It can be hard to walk and stand due to her pain.  Her sx seem to be getting worse She has some pain in her left leg but not like her right She would like for me to complete FMLA paperwork for her today  BP Readings from Last 3 Encounters:  06/17/16 (!) 158/78  06/03/16 (!) 158/84  05/31/16 (!) 173/80   She is taking '5mg'$  of norvasc, 40 or lisinopril and 25 of HCTZ right now.  Wonders if we should change a medication to bring down her BP.  She does not have any LE swelling  She has a history of colon cancer and has not had any films of her leg done as of yet  Also no recent labs to check her calcium level   S/p hysterectomy No fever, chills, nausea, vomiting, rash, redness or swelling noted   Patient Active Problem List   Diagnosis Date Noted  . History of DVT of lower extremity 05/31/2016  . History of colon cancer, stage I 08/17/2015  . Varicose 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 08/26/2014  . HTN (hypertension) 12/24/2013  . Cancer of left colon ([place]) 02/25/2012  . DVT (deep venous thrombosis)  ([place]) 08/27/2011    Past Medical History:  Diagnosis Date  . Cancer ([place]) 08/03/2010  . DVT (deep venous thrombosis) ([place])   . Hypertension   . Leg DVT (deep venous thromboembolism), acute ([place])   . Uterine fibroid     Past Surgical History:  Procedure Laterality Date  . ABDOMINAL HYSTERECTOMY    . CHOLECYSTECTOMY    . COLON SURGERY    . GALLBLADDER SURGERY      Social History  Substance Use Topics  . Smoking status: Never Smoker  . Smokeless tobacco: Never Used  . Alcohol use No    Family History  Problem Relation Age of Onset  . Hypertension Father     No Known Allergies  Medication list has been reviewed and updated.  Current Outpatient Prescriptions on File Prior to Visit  Medication Sig Dispense Refill  . amLODipine (NORVASC) 5 MG tablet Take 1 tablet (5 mg total) by mouth daily. 90 tablet 3  . lisinopril (PRINIVIL,ZESTRIL) 40 MG tablet Take 1 tablet (40 mg total) by mouth daily. 90 tablet 3  . hydrochlorothiazide (HYDRODIURIL) 25 MG tablet TAKE ONE TABLET BY MOUTH ONE TIME DAILY (Patient not taking: Reported on 06/17/2016) 90 tablet 3   No current facility-administered  medications on file prior to visit.     Review of Systems:  As per HPI- otherwise negative.   Physical Examination: Vitals:   06/17/16 1623  BP: (!) 158/78  Pulse: 81  Temp: 98 F (36.7 C)   Vitals:   06/17/16 1623  Weight: 202 lb 3.2 oz (91.7 kg)  Height: '5\' 7"'$  (1.702 m)   Body mass index is 31.67 kg/m. Ideal Body Weight: Weight in (lb) to have BMI = 25: 159.3  GEN: WDWN, NAD, Non-toxic, A & O x 3, large build, looks well HEENT: Atraumatic, Normocephalic. Neck supple. No masses, No LAD. Ears and Nose: No external deformity. CV: RRR, No M/G/R. No JVD. No thrill. No extra heart sounds. PULM: CTA B, no wheezes, crackles, rhonchi. No retractions. No resp. distress. No accessory muscle use. ABD: S, NT, ND, +BS. No rebound. No HSM. EXTR: No c/c/e NEURO Normal gait.  PSYCH:  Normally interactive. Conversant. Not depressed or anxious appearing.  Calm demeanor Right leg: she is tender in the medial muscles of the thigh and calf just proximal and distal to the knee.   The knee joint does not seem to be the source of her pain.  No effusion or crepitusThe posterior calf and thigh are non- tender. She has normal ROM of the knee.  No redness or heat Less so on the left   Assessment and Plan: Right leg pain - Plan: Comprehensive metabolic panel, CK, DG FEMUR, MIN 2 VIEWS RIGHT, DG Tibia/Fibula Right  Essential hypertension  Here today with unexplained right leg muscle pain- will send for films and labs as above Did FMLA paperwork for her today- she may have up to 10 hours a week Will attempt to find a cause of her pain as above In the meantime she will try tylenol Increased her amlodipine to 10 mg Will plan further follow- up pending labs.  Signed Lamar Blinks, MD  Received her labs and x-rays 11/7.  Her K is a bit low, which might be contributing to her muscle pains.  Her CK is mildly elevated but she is active so I would consider this to be normal. Per chart I am not certain if she is taking HCTZ, but would assume that she is taking it. (there is some language barrier) Called her to go over labs but did not reach, LMOM.  Asked her to please increase dietary K with 1-2 bananas a day for the time being  Results for orders placed or performed in visit on 06/17/16  Comprehensive metabolic panel  Result Value Ref Range   Sodium 142 135 - 145 mEq/L   Potassium 3.2 (L) 3.5 - 5.1 mEq/L   Chloride 104 96 - 112 mEq/L   CO2 31 19 - 32 mEq/L   Glucose, Bld 136 (H) 70 - 99 mg/dL   BUN 15 6 - 23 mg/dL   Creatinine, Ser 0.92 0.40 - 1.20 mg/dL   Total Bilirubin 0.5 0.2 - 1.2 mg/dL   Alkaline Phosphatase 58 39 - 117 U/L   AST 23 0 - 37 U/L   ALT 21 0 - 35 U/L   Total Protein 6.8 6.0 - 8.3 g/dL   Albumin 4.5 3.5 - 5.2 g/dL   Calcium 9.9 8.4 - 10.5 mg/dL   GFR 65.61  >60.00 mL/min  CK  Result Value Ref Range   Total CK 301 (H) 7 - 177 U/L   Dg Tibia/fibula Right  Result Date: 06/18/2016 CLINICAL DATA:  60 year old female with pain in the  medial aspect of her right lower leg over the past 2 months. EXAM: RIGHT TIBIA AND FIBULA - 2 VIEW COMPARISON:  None. FINDINGS: There is no evidence of fracture or other focal bone lesions. Soft tissues are unremarkable. IMPRESSION: Negative. Electronically Signed   By: Jacqulynn Cadet M.D.   On: 06/18/2016 09:24   US Venous Img Lower Unilateral Right  Result Date: 06/03/2016 CLINICAL DATA:  Right inter medial thigh pain for 2-3 weeks. Right lower extremity edema and swelling EXAM: Right LOWER EXTREMITY VENOUS DOPPLER ULTRASOUND TECHNIQUE: Gray-scale sonography with graded compression, as well as color Doppler and duplex ultrasound were performed to evaluate the lower extremity deep venous systems from the level of the common femoral vein and including the common femoral, femoral, profunda femoral, popliteal and calf veins including the posterior tibial, peroneal and gastrocnemius veins when visible. The superficial great saphenous vein was also interrogated. Spectral Doppler was utilized to evaluate flow at rest and with distal augmentation maneuvers in the common femoral, femoral and popliteal veins. COMPARISON:  None. FINDINGS: Contralateral Common Femoral Vein: Respiratory phasicity is normal and symmetric with the symptomatic side. No evidence of thrombus. Normal compressibility. Common Femoral Vein: No evidence of thrombus. Normal compressibility, respiratory phasicity and response to augmentation. Saphenofemoral Junction: No evidence of thrombus. Normal compressibility and flow on color Doppler imaging. Profunda Femoral Vein: No evidence of thrombus. Normal compressibility and flow on color Doppler imaging. Femoral Vein: No evidence of thrombus. Normal compressibility, respiratory phasicity and response to augmentation.  Popliteal Vein: No evidence of thrombus. Normal compressibility, respiratory phasicity and response to augmentation. Calf Veins: No evidence of thrombus. Normal compressibility and flow on color Doppler imaging. Superficial Great Saphenous Vein: No evidence of thrombus. Normal compressibility and flow on color Doppler imaging. Venous Reflux:  None. Other Findings:  None. IMPRESSION: No evidence of deep venous thrombosis. Electronically Signed   By: Donavan Foil M.D.   On: 06/03/2016 21:38   Dg Femur, Min 2 Views Right  Result Date: 06/17/2016 CLINICAL DATA:  Pain going down the medial aspect of the right upper leg. EXAM: RIGHT FEMUR 2 VIEWS COMPARISON:  Right lower leg 06/17/2016 FINDINGS: Right hip is located. Negative for femur fracture. Mild joint space narrowing in the medial right knee joint. Joint space narrowing and subchondral sclerosis in the superior right hip joint. IMPRESSION: No acute bone abnormality. Degenerative changes in the right hip and right knee. Electronically Signed   By: Markus Daft M.D.   On: 06/17/2016 17:33   Called and reached her on 11/9- went over labs and x-rays.  She is NOT taking HCTZ.  She will come in for a lab visit only next week to recheck her K.  I do not know why her legs are hurting- will refer to sports med

## 2016-06-17 NOTE — Progress Notes (Signed)
Pre visit review using our clinic review tool, if applicable. No additional management support is needed unless otherwise documented below in the visit note. 

## 2016-06-18 LAB — CK: Total CK: 301 U/L — ABNORMAL HIGH (ref 7–177)

## 2016-06-18 LAB — COMPREHENSIVE METABOLIC PANEL
ALK PHOS: 58 U/L (ref 39–117)
ALT: 21 U/L (ref 0–35)
AST: 23 U/L (ref 0–37)
Albumin: 4.5 g/dL (ref 3.5–5.2)
BILIRUBIN TOTAL: 0.5 mg/dL (ref 0.2–1.2)
BUN: 15 mg/dL (ref 6–23)
CO2: 31 meq/L (ref 19–32)
Calcium: 9.9 mg/dL (ref 8.4–10.5)
Chloride: 104 mEq/L (ref 96–112)
Creatinine, Ser: 0.92 mg/dL (ref 0.40–1.20)
GFR: 65.61 mL/min (ref >60.00)
GLUCOSE: 136 mg/dL — AB (ref 70–99)
Potassium: 3.2 mEq/L — ABNORMAL LOW (ref 3.5–5.1)
SODIUM: 142 meq/L (ref 135–145)
TOTAL PROTEIN: 6.8 g/dL (ref 6.0–8.3)

## 2016-06-20 NOTE — Addendum Note (Signed)
Addended by: Lamar Blinks C on: 06/20/2016 07:36 PM   Modules accepted: Orders

## 2016-06-25 ENCOUNTER — Other Ambulatory Visit (INDEPENDENT_AMBULATORY_CARE_PROVIDER_SITE_OTHER): Payer: 59

## 2016-06-25 DIAGNOSIS — E876 Hypokalemia: Secondary | ICD-10-CM | POA: Diagnosis not present

## 2016-06-26 ENCOUNTER — Encounter: Payer: Self-pay | Admitting: Family Medicine

## 2016-06-26 LAB — BASIC METABOLIC PANEL
BUN: 16 mg/dL (ref 6–23)
CALCIUM: 9.7 mg/dL (ref 8.4–10.5)
CHLORIDE: 102 meq/L (ref 96–112)
CO2: 32 meq/L (ref 19–32)
Creatinine, Ser: 1.02 mg/dL (ref 0.40–1.20)
GFR: 58.24 mL/min — ABNORMAL LOW (ref >60.00)
Glucose, Bld: 170 mg/dL — ABNORMAL HIGH (ref 70–99)
Potassium: 3.7 mEq/L (ref 3.5–5.1)
SODIUM: 141 meq/L (ref 135–145)

## 2016-06-27 ENCOUNTER — Telehealth: Payer: Self-pay | Admitting: Family Medicine

## 2016-06-27 ENCOUNTER — Other Ambulatory Visit: Payer: Self-pay | Admitting: Emergency Medicine

## 2016-06-27 ENCOUNTER — Ambulatory Visit (INDEPENDENT_AMBULATORY_CARE_PROVIDER_SITE_OTHER): Payer: 59 | Admitting: Family Medicine

## 2016-06-27 ENCOUNTER — Encounter: Payer: Self-pay | Admitting: Family Medicine

## 2016-06-27 DIAGNOSIS — I10 Essential (primary) hypertension: Secondary | ICD-10-CM

## 2016-06-27 DIAGNOSIS — M79605 Pain in left leg: Secondary | ICD-10-CM | POA: Diagnosis not present

## 2016-06-27 DIAGNOSIS — M79604 Pain in right leg: Secondary | ICD-10-CM

## 2016-06-27 MED ORDER — AMLODIPINE BESYLATE 5 MG PO TABS: 5.0000 mg | ORAL_TABLET | Freq: Every day | ORAL | 3 refills | Status: DC

## 2016-06-27 MED ORDER — LISINOPRIL 40 MG PO TABS: 40.0000 mg | ORAL_TABLET | Freq: Every day | ORAL | 3 refills | Status: DC

## 2016-06-27 NOTE — Patient Instructions (Signed)
I'm concerned about you having a peripheral neuropathy. We will set up a neurology referral for you to see them - they may do a special nerve test and bloodwork, possibly other testing depending on their examination of you. We can consider physical therapy, a nerve-blocking medication if their testing comes back normal.

## 2016-06-27 NOTE — Telephone Encounter (Signed)
Refills sent to [facility] [place] per pt request.

## 2016-06-27 NOTE — Telephone Encounter (Signed)
Caller name: Juley  Relation to pt: self Call back number: [phone] Pharmacy: [facility], [place]  Reason for call: Pt came in office requesting refill on her High BP meds, amLODipine (NORVASC) 5 MG tablet and lisinopril (PRINIVIL,ZESTRIL) 40 MG tablet. Pt would like to have her meds sent to the pharmacy mentioned above. Please advise.

## 2016-06-28 NOTE — Telephone Encounter (Signed)
LF pt message on voicemail.

## 2016-07-01 ENCOUNTER — Telehealth: Payer: Self-pay | Admitting: Family Medicine

## 2016-07-01 NOTE — Telephone Encounter (Signed)
Pt dropped off documents to be filled out (FMLA paperwork [place]- 5 pages) Pt states was needing documents for today, pt was informed that it was difficult since it has from 5 to 7 business day for [place] to fill out documents, so pt is calling FMLA people to extend her due date and will inform us the due date.

## 2016-07-02 DIAGNOSIS — M79604 Pain in right leg: Secondary | ICD-10-CM | POA: Insufficient documentation

## 2016-07-02 DIAGNOSIS — M79605 Pain in left leg: Principal | ICD-10-CM

## 2016-07-02 HISTORY — DX: Pain in right leg: M79.604

## 2016-07-02 NOTE — Progress Notes (Signed)
PCP and consultation requested by: Lamar Blinks, MD  Subjective:   HPI: Patient is a 60 y.o. female here for bilateral leg pain.  Patient reports she's had bilateral leg pain for 6-7 years.   Pain worse past 3-4 months though. Pain level 10/10 on right, 8/10 on left. Pain, increased sensitivity. No associated back pain. Pain radiates from groin to feet. Doppler ultrasound was negative. Seems better over the weekends. Radiographs of femur, tibia/fibula on right negative. No skin changes. She is not diabetic but has had mildly elevated random glucose levels.  Past Medical History:  Diagnosis Date  . Cancer ([place]) 08/03/2010  . DVT (deep venous thrombosis) ([place])   . Hypertension   . Leg DVT (deep venous thromboembolism), acute ([place])   . Uterine fibroid     Current Outpatient Prescriptions on File Prior to Visit  Medication Sig Dispense Refill  . hydrochlorothiazide (HYDRODIURIL) 25 MG tablet TAKE ONE TABLET BY MOUTH ONE TIME DAILY (Patient not taking: Reported on 06/17/2016) 90 tablet 3   No current facility-administered medications on file prior to visit.     Past Surgical History:  Procedure Laterality Date  . ABDOMINAL HYSTERECTOMY    . CHOLECYSTECTOMY    . COLON SURGERY    . GALLBLADDER SURGERY      No Known Allergies  Social History   Social History  . Marital status: Married    Spouse name: N/A  . Number of children: N/A  . Years of education: N/A   Occupational History  . Not on file.   Social History Main Topics  . Smoking status: Never Smoker  . Smokeless tobacco: Never Used  . Alcohol use No  . Drug use: No  . Sexual activity: Not on file   Other Topics Concern  . Not on file   Social History Narrative  . No narrative on file    Family History  Problem Relation Age of Onset  . Hypertension Father     BP (!) 167/93   Pulse 64   Ht '5\' 7"'$  (1.702 m)   Wt 195 lb (88.5 kg)   BMI 30.54 kg/m   Review of Systems: See HPI above.      Objective:  Physical Exam:  Gen: NAD, comfortable in exam room  Back/bilateral legs: No gross deformity, scoliosis. TTP R > L medial hamstring, quad, gastroc muscles primarily.  No midline or bony TTP. FROM back, hips, knees, ankles. Strength LEs 5/5 all muscle groups.   2+ MSRs in patellar and achilles tendons, equal bilaterally. Negative SLRs. Sensation intact to light touch bilaterally. Negative logroll bilateral hips Negative fabers and piriformis stretches.   Assessment & Plan:  1. Bilateral leg pain - History and exam more consistent with a peripheral neuropathy.  Unlikely she has several years of pain that is muscular in hamstrings, quads, and gastrocnemius muscles.  No evidence spinal stenosis based on history and exam.  Will go ahead with neurology referral for further evaluation.

## 2016-07-02 NOTE — Assessment & Plan Note (Signed)
History and exam more consistent with a peripheral neuropathy.  Unlikely she has several years of pain that is muscular in hamstrings, quads, and gastrocnemius muscles.  No evidence spinal stenosis based on history and exam.  Will go ahead with neurology referral for further evaluation.

## 2016-07-02 NOTE — Telephone Encounter (Addendum)
Document received and forwarded to PCP with note.  Pt saw Dr. Lorelei Pont on 06/17/16 and FMLA paperwork was completed during visit.  Called patient to verify need to complete FMLA paperwork again.  Left a message for call back.

## 2016-07-03 NOTE — Telephone Encounter (Signed)
FMLA paperwork filled out by Dr. Lorelei Pont and is ready for pt to pick up. Form has been placed at front desk for pick up. Tried to contact pt, no answer. Left voicemail informing pt that form is ready for pick up.

## 2016-07-03 NOTE — Addendum Note (Signed)
Addended by: Sherrie George F on: 07/03/2016 01:26 PM   Modules accepted: Orders

## 2016-07-08 ENCOUNTER — Telehealth: Payer: Self-pay | Admitting: Family Medicine

## 2016-07-08 NOTE — Telephone Encounter (Signed)
Patient came to pick up forms

## 2016-07-10 NOTE — Telephone Encounter (Signed)
Contacted patient and told her that [facility] would contact her about an appointment.

## 2016-07-17 ENCOUNTER — Telehealth: Payer: Self-pay

## 2016-07-17 NOTE — Telephone Encounter (Signed)
Pre visit call made to patient. Left message for return call.

## 2016-07-18 ENCOUNTER — Ambulatory Visit (INDEPENDENT_AMBULATORY_CARE_PROVIDER_SITE_OTHER): Payer: 59 | Admitting: Family Medicine

## 2016-07-18 ENCOUNTER — Encounter: Payer: Self-pay | Admitting: Family Medicine

## 2016-07-18 VITALS — BP 138/86 | HR 57 | Temp 98.2°F | Resp 16 | Ht 67.0 in | Wt 198.4 lb

## 2016-07-18 DIAGNOSIS — Z85038 Personal history of other malignant neoplasm of large intestine: Secondary | ICD-10-CM

## 2016-07-18 DIAGNOSIS — I10 Essential (primary) hypertension: Secondary | ICD-10-CM

## 2016-07-18 DIAGNOSIS — R102 Pelvic and perineal pain: Secondary | ICD-10-CM | POA: Diagnosis not present

## 2016-07-18 MED ORDER — AMLODIPINE BESYLATE 10 MG PO TABS: 10.0000 mg | ORAL_TABLET | Freq: Every day | ORAL | 3 refills | Status: DC

## 2016-07-18 NOTE — Patient Instructions (Signed)
It was very nice to see you again today- your blood pressure looks great!  Continue taking 2 of your amlodipine '5mg'$  daily; when you finish what you have change to the 10 mg pills, one a day I am going to set you up for an ultrasound of your pelvic area to try and find any cause for your pain  Take care, I will be in touch with your ultrasound results asap

## 2016-07-18 NOTE — Progress Notes (Addendum)
[place] at [facility] [address] [place], [place] [address] [phone]  Date:  78/04/3809   Name:  [name]   DOB:  11-Apr-1956   MRN:  [id number]  PCP:  Lamar Blinks, MD    Chief Complaint: here establish care (hypertension follow up)   History of Present Illness:  [name] is a 60 y.o. very pleasant female patient who presents with the following:  Here today to recheck her BP-  We increased her amlodipine from 5 to 10 mg at her last visit. She is tolerating this well and has not noted any SE This seems to be controlling her BP better.  She did have colon cancer several years ago- 2011, had a partial colectomy- she saw her oncoloigst just last month, at this point they feel that risk of colon cancer recurrence is minimal Per her oncology note she had complaint of intermittent diarrhea- however today she states that she has had pain in her pelvic area only when she is sitting for the last year.   It is not present every day, but does occur most days It goes away when she stands up She is worried that this could mean her cancer is back She is s/p hysterectomy   Patient Active Problem List   Diagnosis Date Noted  . Bilateral leg pain 07/02/2016  . History of DVT of lower extremity 05/31/2016  . History of colon cancer, stage I 08/17/2015  . Varicose 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 08/26/2014  . HTN (hypertension) 12/24/2013  . Cancer of left colon ([place]) 02/25/2012  . DVT (deep venous thrombosis) ([place]) 08/27/2011    Past Medical History:  Diagnosis Date  . Cancer ([place]) 08/03/2010  . DVT (deep venous thrombosis) ([place])   . Hypertension   . Leg DVT (deep venous thromboembolism), acute ([place])   . Uterine fibroid     Past Surgical History:  Procedure Laterality Date  . ABDOMINAL HYSTERECTOMY    . CHOLECYSTECTOMY    . COLON SURGERY    . GALLBLADDER SURGERY      Social History   Substance Use Topics  . Smoking status: Never Smoker  . Smokeless tobacco: Never Used  . Alcohol use No    Family History  Problem Relation Age of Onset  . Hypertension Father     No Known Allergies  Medication list has been reviewed and updated.  Current Outpatient Prescriptions on File Prior to Visit  Medication Sig Dispense Refill  . amLODipine (NORVASC) 5 MG tablet Take 1 tablet (5 mg total) by mouth daily. (Patient taking differently: Take 10 mg by mouth daily. ) 90 tablet 3  . hydrochlorothiazide (HYDRODIURIL) 25 MG tablet TAKE ONE TABLET BY MOUTH ONE TIME DAILY 90 tablet 3  . lisinopril (PRINIVIL,ZESTRIL) 40 MG tablet Take 1 tablet (40 mg total) by mouth daily. 90 tablet 3   No current facility-administered medications on file prior to visit.     Review of Systems:  As per HPI- otherwise negative.  No fever, chills, nausea, vomiting, ST, rash Wt Readings from Last 3 Encounters:  07/18/16 198 lb 6.4 oz (90 kg)  06/27/16 195 lb (88.5 kg)  06/17/16 202 lb 3.2 oz (91.7 kg)   Weight is stable   Physical Examination: Vitals:   07/18/16 1620  BP: 138/86  Pulse: (!) 57  Resp: 16  Temp: 98.2 F (36.8 C)   Vitals:   07/18/16 1620  Weight: 198 lb  6.4 oz (90 kg)  Height: '5\' 7"'$  (1.702 m)   Body mass index is 31.07 kg/m. Ideal Body Weight: Weight in (lb) to have BMI = 25: 159.3  GEN: WDWN, NAD, Non-toxic, A & O x 3, looks well, large build HEENT: Atraumatic, Normocephalic. Neck supple. No masses, No LAD. Ears and Nose: No external deformity. CV: RRR, No M/G/R. No JVD. No thrill. No extra heart sounds. PULM: CTA B, no wheezes, crackles, rhonchi. No retractions. No resp. distress. No accessory muscle use. ABD: S, NT, ND, +BS. No rebound. No HSM. EXTR: No c/c/e NEURO Normal gait.  PSYCH: Normally interactive. Conversant. Not depressed or anxious appearing.  Calm demeanor.  Belly is benign, no pain with ROM of both hips.  I am not able to reproduce her pelvic pain  with palpation of her belly   Assessment and Plan: Pelvic pain in female - Plan: US Pelvis Complete  Essential hypertension - Plan: amLODipine (NORVASC) 10 MG tablet  History of colon cancer  BP looks better, continue 10 mg of norvasc for her She is very concerned about her pelvic pain and would feel better with some imaging.  Will do an [place] to avoid radiation risk Will plan further follow- up pending labs.  US Transvaginal Non-ob  Result Date: 07/19/2016 CLINICAL DATA:  Diffuse pelvic pain. EXAM: TRANSABDOMINAL AND TRANSVAGINAL ULTRASOUND OF PELVIS TECHNIQUE: Both transabdominal and transvaginal ultrasound examinations of the pelvis were performed. Transabdominal technique was performed for global imaging of the pelvis including uterus, ovaries, adnexal regions, and pelvic cul-de-sac. It was necessary to proceed with endovaginal exam following the transabdominal exam to visualize the adnexa. COMPARISON:  CT 03/10/2013. FINDINGS: Uterus Hysterectomy. Right ovary Measurements: Not visualized. No focal adnexal abnormality identified. Left ovary The oophorectomy.  No focal adnexal abnormality identified. Other findings No abnormal free fluid. IMPRESSION: 1. Hysterectomy.  Left oophorectomy. 2. Right ovary is unremarkable. No focal adnexal abnormality identified. No free pelvic fluid. Electronically Signed   By: Marcello Moores  Register   On: 07/19/2016 16:51   US Pelvis Complete  Result Date: 07/19/2016 CLINICAL DATA:  Diffuse pelvic pain. EXAM: TRANSABDOMINAL AND TRANSVAGINAL ULTRASOUND OF PELVIS TECHNIQUE: Both transabdominal and transvaginal ultrasound examinations of the pelvis were performed. Transabdominal technique was performed for global imaging of the pelvis including uterus, ovaries, adnexal regions, and pelvic cul-de-sac. It was necessary to proceed with endovaginal exam following the transabdominal exam to visualize the adnexa. COMPARISON:  CT 03/10/2013. FINDINGS: Uterus Hysterectomy. Right  ovary Measurements: Not visualized. No focal adnexal abnormality identified. Left ovary The oophorectomy.  No focal adnexal abnormality identified. Other findings No abnormal free fluid. IMPRESSION: 1. Hysterectomy.  Left oophorectomy. 2. Right ovary is unremarkable. No focal adnexal abnormality identified. No free pelvic fluid. Electronically Signed   By: Marcello Moores  Register   On: 07/19/2016 16:51    Signed Lamar Blinks, MD  Also asked her oncologist is we need to do any further follow-up from a colon cancer standpoing:  I saw her in 05/2016, she is 6 years out of her initial stage I colon cancer, I actually discharged her from my clinic, her risk of colon cancer recurrence is minimal now. She did have some diarrhea intermittent, I asked her to contact her GI Dr. Benson Norway for repeated colonoscopy, which is overdue.   I do not think she needs a repeated CT   Thanks

## 2016-07-18 NOTE — Progress Notes (Signed)
Pre visit review using our clinic review tool, if applicable. No additional management support is needed unless otherwise documented below in the visit note. 

## 2016-07-19 ENCOUNTER — Other Ambulatory Visit: Payer: Self-pay | Admitting: Family

## 2016-07-19 ENCOUNTER — Ambulatory Visit (HOSPITAL_BASED_OUTPATIENT_CLINIC_OR_DEPARTMENT_OTHER)
Admission: RE | Admit: 2016-07-19 | Discharge: 2016-07-19 | Disposition: A | Discharge status: Home / Self Care | Payer: 59 | Source: Ambulatory Visit | Attending: Family Medicine | Admitting: Family Medicine

## 2016-07-19 ENCOUNTER — Other Ambulatory Visit: Payer: Self-pay | Admitting: *Deleted

## 2016-07-19 DIAGNOSIS — R102 Pelvic and perineal pain: Secondary | ICD-10-CM | POA: Insufficient documentation

## 2016-07-19 DIAGNOSIS — Z90710 Acquired absence of both cervix and uterus: Secondary | ICD-10-CM | POA: Insufficient documentation

## 2016-07-19 DIAGNOSIS — Z90721 Acquired absence of ovaries, unilateral: Secondary | ICD-10-CM | POA: Insufficient documentation

## 2016-07-21 ENCOUNTER — Telehealth: Payer: Self-pay | Admitting: Family Medicine

## 2016-07-21 NOTE — Telephone Encounter (Signed)
Called pt- her [place] is negative.  Will send a message to her oncologist re: any need to repeat a CT

## 2016-08-08 ENCOUNTER — Encounter: Payer: Self-pay | Admitting: Neurology

## 2016-08-08 ENCOUNTER — Ambulatory Visit (INDEPENDENT_AMBULATORY_CARE_PROVIDER_SITE_OTHER): Payer: 59 | Admitting: Neurology

## 2016-08-08 VITALS — BP 190/90 | HR 61 | Ht 67.0 in | Wt 200.0 lb

## 2016-08-08 DIAGNOSIS — M79605 Pain in left leg: Secondary | ICD-10-CM

## 2016-08-08 DIAGNOSIS — M79604 Pain in right leg: Secondary | ICD-10-CM

## 2016-08-08 NOTE — Progress Notes (Signed)
Reason for visit: Bilateral leg pain  Referring physician: Dr. Antoine Poche Ebert is a 60 y.o. female  History of present illness:  Ms. [name] is a 60 year old right-handed white female with a history of bilateral leg discomfort over the last 6 or 7 years. The patient indicates that the leg pain is worse when she is up on her feet, while she is working. The leg pain is up almost to the hip level, and is most prominent in the medial aspects of the legs always down the feet. The patient indicates that she will have increased swelling in the legs with standing, if she lies down and props her feet up, this helps the swelling and the pain. She has used compression stockings up to the knee level without much benefit with the pain. The pain has worsened over time. She denies any numbness per se or any weakness. She does have some issues controlling the bowels and the bladder. She denies any low back pain or a definite weakness of the legs. She has a sensation of swelling in the hands in the morning that is better in the evening, the swelling the legs is better in the morning and worse in the evening. She does have some neck and shoulder discomfort at times. She is sent to this office for an evaluation.  Past Medical History:  Diagnosis Date  . Cancer ([place]) 08/03/2010   colon  . DVT (deep venous thrombosis) ([place])   . Hypertension   . Leg DVT (deep venous thromboembolism), acute ([place])   . Uterine fibroid     Past Surgical History:  Procedure Laterality Date  . ABDOMINAL HYSTERECTOMY    . CHOLECYSTECTOMY    . COLON SURGERY    . GALLBLADDER SURGERY      Family History  Problem Relation Age of Onset  . Heart attack Mother   . Hypertension Father     Social history:  reports that she has never smoked. She has never used smokeless tobacco. She reports that she does not drink alcohol or use drugs.  Medications:  Prior to Admission medications   Medication Sig Start Date End  Date Taking? Authorizing Provider  amLODipine (NORVASC) 10 MG tablet Take 1 tablet (10 mg total) by mouth daily. 07/18/16  Yes Gay Filler Copland, MD  hydrochlorothiazide (HYDRODIURIL) 25 MG tablet TAKE ONE TABLET BY MOUTH ONE TIME DAILY 09/23/15  Yes Gay Filler Copland, MD  lisinopril (PRINIVIL,ZESTRIL) 40 MG tablet Take 1 tablet (40 mg total) by mouth daily. 06/27/16  Yes Gay Filler Copland, MD     No Known Allergies  ROS:  Out of a complete 14 system review of symptoms, the patient complains only of the following symptoms, and all other reviewed systems are negative.  Leg swelling Skin sensitivity  Blood pressure (!) 190/90, pulse 61, height '5\' 7"'$  (1.702 m), weight 200 lb (90.7 kg).  Physical Exam  General: The patient is alert and cooperative at the time of the examination.  Eyes: Pupils are equal, round, and reactive to light. Discs are flat bilaterally.  Neck: The neck is supple, no carotid bruits are noted.  Respiratory: The respiratory examination is clear.  Cardiovascular: The cardiovascular examination reveals a regular rate and rhythm, no obvious murmurs or rubs are noted.  Neuromuscular: Range of movement of the lumbar spine is full.  Skin: Extremities are with 1+ edema at the ankles bilaterally.  Neurologic Exam  Mental status: The patient is alert and oriented x 3 at  the time of the examination. The patient has apparent normal recent and remote memory, with an apparently normal attention span and concentration ability.  Cranial nerves: Facial symmetry is present. There is good sensation of the face to pinprick and soft touch bilaterally. The strength of the facial muscles and the muscles to head turning and shoulder shrug are normal bilaterally. Speech is well enunciated, no aphasia or dysarthria is noted. Extraocular movements are full. Visual fields are full. The tongue is midline, and the patient has symmetric elevation of the soft palate. No obvious hearing deficits  are noted.  Motor: The motor testing reveals 5 over 5 strength of all 4 extremities. Good symmetric motor tone is noted throughout.  Sensory: Sensory testing is intact to pinprick, soft touch, vibration sensation, and position sense on all 4 extremities, with exception of a stocking pattern pinprick sensory deficit two thirds the way up the legs bilaterally below the knees. No evidence of extinction is noted.  Coordination: Cerebellar testing reveals good finger-nose-finger and heel-to-shin bilaterally.  Gait and station: Gait is normal. Tandem gait is normal. Romberg is negative. No drift is seen.  Reflexes: Deep tendon reflexes are symmetric and normal bilaterally. Toes are downgoing bilaterally.   Assessment/Plan:  1. Bilateral lower extremity discomfort  The patient seems to indicate that the lower extremity discomfort is correlating with the edema, but the patient has minimal edema at the moment and she has reported some discomfort from the mid thigh level down the leg today. The patient could potentially have a low-grade peripheral neuropathy, but her description of the pain does not appear to be consistent with this. She will be set up for nerve conduction studies of both legs and EMG on both legs, she will return for the above study.  Jill Alexanders MD 08/08/2016 2:22 PM  [facility] [address] [place], [place] [address]  Phone [phone] Fax [phone]

## 2016-09-02 ENCOUNTER — Telehealth: Payer: Self-pay | Admitting: Family Medicine

## 2016-09-02 NOTE — Telephone Encounter (Signed)
Pt dropped off FMLA paperwork for Dr. Lorelei Pont to fill out, pt requesting documents is faxed back and would like a call to confirm that it was faxed, documents was placed in tray at front office

## 2016-09-12 NOTE — Telephone Encounter (Signed)
Paperwork received and process initiated.

## 2016-09-12 NOTE — Telephone Encounter (Signed)
Paperwork forwarded to PCP for review and completion.

## 2016-09-20 ENCOUNTER — Telehealth: Payer: Self-pay | Admitting: Family Medicine

## 2016-09-20 NOTE — Telephone Encounter (Signed)
Called pt to ask about the [facility] paperwork that I received as I was not sure what it is for.  She was having some leg pain back in October and I gave her 4 months of FMLA accomodation at that time.  She states that she needs this to continue. Also tells me that she has noted intermittent pain and pressure in her chest for the last 2 weeks and that her BP is running very high- 180/90.  At this time she states that she feels "a pressure" in her chest  Advised her that she could be having a heart attack and that she should go right to the emergency room.  She states understanding

## 2016-09-30 ENCOUNTER — Ambulatory Visit (INDEPENDENT_AMBULATORY_CARE_PROVIDER_SITE_OTHER): Payer: Self-pay | Admitting: Neurology

## 2016-09-30 ENCOUNTER — Ambulatory Visit (INDEPENDENT_AMBULATORY_CARE_PROVIDER_SITE_OTHER): Payer: 59 | Admitting: Neurology

## 2016-09-30 ENCOUNTER — Encounter: Payer: Self-pay | Admitting: Neurology

## 2016-09-30 DIAGNOSIS — M79605 Pain in left leg: Secondary | ICD-10-CM

## 2016-09-30 DIAGNOSIS — M79604 Pain in right leg: Secondary | ICD-10-CM

## 2016-09-30 NOTE — Procedures (Signed)
     HISTORY:  [name] is a 61 year old patient with a history of bilateral lower extremity discomfort is worse when she is up on her feet. The patient reports very mild back pain which is not significant. She is being evaluated for a possible neuropathy or a lumbosacral radiculopathy.  NERVE CONDUCTION STUDIES:  Nerve conduction studies were performed on both lower extremities. The distal motor latencies and motor amplitudes for the peroneal and posterior tibial nerves were within normal limits. The nerve conduction velocities for these nerves were also normal. The H reflex latencies were normal. The sensory latencies for the peroneal nerves were within normal limits.   EMG STUDIES:  EMG study was performed on the right lower extremity:  The tibialis anterior muscle reveals 2 to 4K motor units with full recruitment. No fibrillations or positive waves were seen. The peroneus tertius muscle reveals 2 to 4K motor units with full recruitment. No fibrillations or positive waves were seen. The medial gastrocnemius muscle reveals 1 to 3K motor units with full recruitment. No fibrillations or positive waves were seen. The vastus lateralis muscle reveals 2 to 4K motor units with full recruitment. No fibrillations or positive waves were seen. The iliopsoas muscle reveals 2 to 4K motor units with full recruitment. No fibrillations or positive waves were seen. The biceps femoris muscle (long head) reveals 2 to 4K motor units with full recruitment. No fibrillations or positive waves were seen. The lumbosacral paraspinal muscles were tested at 3 levels, and revealed no abnormalities of insertional activity at all 3 levels tested. There was good relaxation.  EMG study was performed on the left lower extremity:  The tibialis anterior muscle reveals 2 to 4K motor units with full recruitment. No fibrillations or positive waves were seen. The peroneus tertius muscle reveals 2 to 4K motor units  with full recruitment. No fibrillations or positive waves were seen. The medial gastrocnemius muscle reveals 1 to 3K motor units with full recruitment. No fibrillations or positive waves were seen. The vastus lateralis muscle reveals 2 to 4K motor units with full recruitment. No fibrillations or positive waves were seen. The iliopsoas muscle reveals 2 to 4K motor units with full recruitment. No fibrillations or positive waves were seen. The biceps femoris muscle (long head) reveals 2 to 4K motor units with full recruitment. No fibrillations or positive waves were seen. The lumbosacral paraspinal muscles were tested at 3 levels, and revealed no abnormalities of insertional activity at all 3 levels tested. There was good relaxation.   IMPRESSION:  Nerve conduction studies done on both lower extremities were within normal limits. There is no evidence of a peripheral neuropathy. EMG evaluation of both lower extremities were unremarkable, there is no evidence of an overlying lumbosacral radiculopathy.  Jill Alexanders MD 09/30/2016 10:29 AM  [facility] [address] [place], [place] [address]  Phone [phone] Fax [phone]

## 2016-09-30 NOTE — Progress Notes (Signed)
The patient comes in for EMG and nerve conduction study evaluation today. These studies were completely normal, no evidence of a neuropathy or a radiculopathy was seen.  The patient continues to report lower extremity discomfort when she is up on her feet, we will perform MRI evaluation of the lumbosacral spine to evaluate for spinal stenosis.

## 2016-09-30 NOTE — Progress Notes (Signed)
Please refer to EMG and nerve conduction study procedure note. 

## 2016-12-17 NOTE — Progress Notes (Signed)
[place] at [facility] [address] [place], [place] [address] [phone]  Date:  10/12/2949   Name:  [name]   DOB:  [birth year]   MRN:  [id number]  PCP:  Darreld Mclean, MD    Chief Complaint: Elevated BP Reading (c/o elevated bp readings and pain in both legs. Pt would like blood work done today.)   History of Present Illness:  [name] is a 61 y.o. very pleasant female patient who presents with the following:  History of colon cancer, DVT, HTN Here today for a follow-up visit We have seen her a few times last year with complaint of leg pains-  She is seeing Dr. Jannifer Franklin (neurology).  Her evaluation for her legs has been normal so far- she had normal nerve conduction studies. She was supposed to have an MRI to eval for lumbar stenosis but I do not see where this was done. She states that "nobody called me for MRI."   BP Readings from Last 3 Encounters:  12/18/16 (!) 146/92  08/08/16 (!) 190/90  07/18/16 138/86   Her BP is high today- the last time I saw her in the office it was ok She is on lisinopril- had been on HCTZ and also norvasc 10 but stopped using these.  She is not sure when she stopped using these.  She just ran out and did not refill them.   She is fasting today for labs- would like to have done today.    Due for a tetanus today- will update for her   Wt Readings from Last 3 Encounters:  12/18/16 195 lb (88.5 kg)  08/08/16 200 lb (90.7 kg)  07/18/16 198 lb 6.4 oz (90 kg)      Patient Active Problem List   Diagnosis Date Noted  . Bilateral leg pain 07/02/2016  . History of DVT of lower extremity 05/31/2016  . History of colon cancer, stage I 08/17/2015  . Varicose 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 08/26/2014  . HTN (hypertension) 12/24/2013  . Cancer of left colon ([place]) 02/25/2012  . DVT (deep venous thrombosis) ([place]) 08/27/2011    Past Medical History:  Diagnosis Date   . Cancer ([place]) 08/03/2010   colon  . DVT (deep venous thrombosis) ([place])   . Hypertension   . Leg DVT (deep venous thromboembolism), acute ([place])   . Uterine fibroid     Past Surgical History:  Procedure Laterality Date  . ABDOMINAL HYSTERECTOMY    . CHOLECYSTECTOMY    . COLON SURGERY    . GALLBLADDER SURGERY      Social History  Substance Use Topics  . Smoking status: Never Smoker  . Smokeless tobacco: Never Used  . Alcohol use No    Family History  Problem Relation Age of Onset  . Heart attack Mother   . Hypertension Father     No Known Allergies  Medication list has been reviewed and updated.  Current Outpatient Prescriptions on File Prior to Visit  Medication Sig Dispense Refill  . lisinopril (PRINIVIL,ZESTRIL) 40 MG tablet Take 1 tablet (40 mg total) by mouth daily. 90 tablet 3   No current facility-administered medications on file prior to visit.     Review of Systems:  As per HPI- otherwise negative.   Physical Examination: Vitals:   12/18/16 0949 12/18/16 0954  BP: (!) 150/94 (!) 146/92  Pulse: 61   Temp: 98 F (36.7 C)  Vitals:   12/18/16 0949  Weight: 195 lb (88.5 kg)  Height: '5\' 7"'$  (1.702 m)   Body mass index is 30.54 kg/m. Ideal Body Weight: Weight in (lb) to have BMI = 25: 159.3  GEN: WDWN, NAD, Non-toxic, A & O x 3, overweight/ large build  Look well HEENT: Atraumatic, Normocephalic. Neck supple. No masses, No LAD. Ears and Nose: No external deformity. CV: RRR, No M/G/R. No JVD. No thrill. No extra heart sounds. PULM: CTA B, no wheezes, crackles, rhonchi. No retractions. No resp. distress. No accessory muscle use. ABD: S, NT, ND. No rebound. No HSM. EXTR: No c/c/e NEURO Normal gait.  PSYCH: Normally interactive. Conversant. Not depressed or anxious appearing.  Calm demeanor.    Assessment and Plan: Essential hypertension - Plan: Comprehensive metabolic panel, hydrochlorothiazide (HYDRODIURIL) 25 MG tablet  Immunization due -  Plan: Tdap vaccine greater than or equal to 7yo IM  Screening for thyroid disorder - Plan: TSH  Encounter for hepatitis C screening test for low risk patient - Plan: Hepatitis C antibody  Encounter for vitamin deficiency screening - Plan: Vitamin D (25 hydroxy)  Screening for hyperlipidemia - Plan: Lipid panel  Screening for diabetes mellitus - Plan: Hemoglobin A1c  Here today for a follow-up visit [name] sometimes does not understand English instructions completely and did not understand that she was to keep taking all her BP meds. Right now she is just on lisinopril- however as her BP is not terribly high will just add back her HCTZ for now Updated tdap Labs today Follow-up in 4-6 weeks Will contact her neurologist about her MRI  Signed Lamar Blinks, MD

## 2016-12-18 ENCOUNTER — Ambulatory Visit (INDEPENDENT_AMBULATORY_CARE_PROVIDER_SITE_OTHER): Payer: 59 | Admitting: Family Medicine

## 2016-12-18 ENCOUNTER — Other Ambulatory Visit: Payer: Self-pay | Admitting: Neurology

## 2016-12-18 VITALS — BP 146/92 | HR 61 | Temp 98.0°F | Ht 67.0 in | Wt 195.0 lb

## 2016-12-18 DIAGNOSIS — Z13220 Encounter for screening for lipoid disorders: Secondary | ICD-10-CM

## 2016-12-18 DIAGNOSIS — Z1321 Encounter for screening for nutritional disorder: Secondary | ICD-10-CM

## 2016-12-18 DIAGNOSIS — M79604 Pain in right leg: Secondary | ICD-10-CM

## 2016-12-18 DIAGNOSIS — I10 Essential (primary) hypertension: Secondary | ICD-10-CM

## 2016-12-18 DIAGNOSIS — Z23 Encounter for immunization: Secondary | ICD-10-CM | POA: Diagnosis not present

## 2016-12-18 DIAGNOSIS — Z1329 Encounter for screening for other suspected endocrine disorder: Secondary | ICD-10-CM

## 2016-12-18 DIAGNOSIS — Z1159 Encounter for screening for other viral diseases: Secondary | ICD-10-CM

## 2016-12-18 DIAGNOSIS — Z131 Encounter for screening for diabetes mellitus: Secondary | ICD-10-CM

## 2016-12-18 DIAGNOSIS — M79605 Pain in left leg: Principal | ICD-10-CM

## 2016-12-18 LAB — COMPREHENSIVE METABOLIC PANEL
ALT: 30 U/L (ref 0–35)
AST: 24 U/L (ref 0–37)
Albumin: 4.6 g/dL (ref 3.5–5.2)
Alkaline Phosphatase: 74 U/L (ref 39–117)
BUN: 15 mg/dL (ref 6–23)
CO2: 30 mEq/L (ref 19–32)
Calcium: 9.5 mg/dL (ref 8.4–10.5)
Chloride: 104 mEq/L (ref 96–112)
Creatinine, Ser: 0.93 mg/dL (ref 0.40–1.20)
GFR: 64.69 mL/min (ref >60.00)
Glucose, Bld: 112 mg/dL — ABNORMAL HIGH (ref 70–99)
Potassium: 3.6 mEq/L (ref 3.5–5.1)
Sodium: 142 mEq/L (ref 135–145)
Total Bilirubin: 1 mg/dL (ref 0.2–1.2)
Total Protein: 6.9 g/dL (ref 6.0–8.3)

## 2016-12-18 LAB — LIPID PANEL
CHOL/HDL RATIO: 5
Cholesterol: 316 mg/dL — ABNORMAL HIGH (ref 0–200)
HDL: 58.8 mg/dL (ref >39.00)
LDL Cholesterol: 229 mg/dL — ABNORMAL HIGH (ref 0–99)
NONHDL: 257.54
TRIGLYCERIDES: 142 mg/dL (ref 0.0–149.0)
VLDL: 28.4 mg/dL (ref 0.0–40.0)

## 2016-12-18 LAB — TSH: TSH: 2.42 u[IU]/mL (ref 0.35–4.50)

## 2016-12-18 LAB — VITAMIN D 25 HYDROXY (VIT D DEFICIENCY, FRACTURES): VITD: 22.82 ng/mL — ABNORMAL LOW (ref 30.00–100.00)

## 2016-12-18 LAB — HEPATITIS C ANTIBODY: HCV Ab: NEGATIVE

## 2016-12-18 LAB — HEMOGLOBIN A1C: HEMOGLOBIN A1C: 6.3 % (ref 4.6–6.5)

## 2016-12-18 MED ORDER — HYDROCHLOROTHIAZIDE 25 MG PO TABS: ORAL_TABLET | ORAL | 3 refills | Status: DC

## 2016-12-18 NOTE — Patient Instructions (Addendum)
It was good to see you again today!  Your got your tetanus booster (good for 10 years) We will be in touch with your labs soon I will ask Dr. Jannifer Franklin to set up your MRI- please let me know if you do not hear about this appointment For your blood pressure, please continue to take the lisinopril and also re-start taking the hydrochlorothiazide.    Please see me in 4-6 weeks to check on your blood pressure

## 2016-12-28 ENCOUNTER — Encounter: Payer: Self-pay | Admitting: Family Medicine

## 2017-01-15 ENCOUNTER — Ambulatory Visit (INDEPENDENT_AMBULATORY_CARE_PROVIDER_SITE_OTHER): Payer: 59 | Admitting: Family Medicine

## 2017-01-15 VITALS — BP 140/85 | HR 62 | Temp 98.5°F | Ht 67.0 in | Wt 196.4 lb

## 2017-01-15 DIAGNOSIS — E559 Vitamin D deficiency, unspecified: Secondary | ICD-10-CM | POA: Diagnosis not present

## 2017-01-15 DIAGNOSIS — I10 Essential (primary) hypertension: Secondary | ICD-10-CM | POA: Diagnosis not present

## 2017-01-15 DIAGNOSIS — Z5181 Encounter for therapeutic drug level monitoring: Secondary | ICD-10-CM

## 2017-01-15 DIAGNOSIS — E785 Hyperlipidemia, unspecified: Secondary | ICD-10-CM

## 2017-01-15 MED ORDER — VITAMIN D (ERGOCALCIFEROL) 1.25 MG (50000 UNIT) PO CAPS: 50000.0000 [IU] | ORAL_CAPSULE | ORAL | 0 refills | Status: DC

## 2017-01-15 MED ORDER — LOVASTATIN 20 MG PO TABS: 20.0000 mg | ORAL_TABLET | Freq: Every day | ORAL | 3 refills | Status: DC

## 2017-01-15 NOTE — Patient Instructions (Signed)
Your blood pressure looks good- continue your current lisinopril and HCTZ for blood pressure I also started you on a cholesterol medication and vitamin D today  We will check on your kidneys and electrolytes today- I will be in touch with your results  Take care and please see me in 5-6 months

## 2017-01-15 NOTE — Progress Notes (Signed)
Pre visit review using our clinic tool,if applicable. No additional management support is needed unless otherwise documented below in the visit note.  

## 2017-01-15 NOTE — Progress Notes (Signed)
[place] at [facility] [address] [place], [place] [address] [phone]  Date:  11/10/6604   Name:  [name]   DOB:  Nov 26, 1955   MRN:  [id number]  PCP:  Darreld Mclean, MD    Chief Complaint: Follow-up (Blood Pressure)   History of Present Illness:  [name] is a 61 y.o. very pleasant female patient who presents with the following:  Here today for a recheck of her BP- we added HCTZ to her regimen about one month ago. She is tolerating this well  She got my recent letter about her cholesterol and vitamin D-  She is ok with starting a cholesterol med and also treatment for her vitamin D def   BP Readings from Last 3 Encounters:  01/15/17 140/85  12/18/16 (!) 146/92  08/08/16 (!) 190/90   She does check her BP at home; she may run 150/ 75- 85 No headaches or chest pain  Patient Active Problem List   Diagnosis Date Noted  . Bilateral leg pain 07/02/2016  . History of DVT of lower extremity 05/31/2016  . History of colon cancer, stage I 08/17/2015  . Varicose 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 08/26/2014  . HTN (hypertension) 12/24/2013  . Cancer of left colon ([place]) 02/25/2012  . DVT (deep venous thrombosis) ([place]) 08/27/2011    Past Medical History:  Diagnosis Date  . Cancer ([place]) 08/03/2010   colon  . DVT (deep venous thrombosis) ([place])   . Hypertension   . Leg DVT (deep venous thromboembolism), acute ([place])   . Uterine fibroid     Past Surgical History:  Procedure Laterality Date  . ABDOMINAL HYSTERECTOMY    . CHOLECYSTECTOMY    . COLON SURGERY    . GALLBLADDER SURGERY      Social History  Substance Use Topics  . Smoking status: Never Smoker  . Smokeless tobacco: Never Used  . Alcohol use No    Family History  Problem Relation Age of Onset  . Heart attack Mother   . Hypertension Father     No Known Allergies  Medication list has been reviewed and updated.  Current  Outpatient Prescriptions on File Prior to Visit  Medication Sig Dispense Refill  . hydrochlorothiazide (HYDRODIURIL) 25 MG tablet TAKE ONE TABLET BY MOUTH ONE TIME DAILY 90 tablet 3  . lisinopril (PRINIVIL,ZESTRIL) 40 MG tablet Take 1 tablet (40 mg total) by mouth daily. 90 tablet 3   No current facility-administered medications on file prior to visit.     Review of Systems:  As per HPI- otherwise negative.   Physical Examination: Vitals:   01/15/17 1538 01/15/17 1553  BP: (!) 154/75 140/85  Pulse:    Temp:     Vitals:   01/15/17 1534  Weight: 196 lb 6.4 oz (89.1 kg)  Height: 5\' 7"  (1.702 m)   Body mass index is 30.76 kg/m. Ideal Body Weight: Weight in (lb) to have BMI = 25: 159.3  GEN: WDWN, NAD, Non-toxic, A & O x 3, looks well, large build  HEENT: Atraumatic, Normocephalic. Neck supple. No masses, No LAD. Ears and Nose: No external deformity. CV: RRR, No M/G/R. No JVD. No thrill. No extra heart sounds. PULM: CTA B, no wheezes, crackles, rhonchi. No retractions. No resp. distress. No accessory muscle use. EXTR: No c/c/e NEURO Normal gait.  PSYCH: Normally interactive. Conversant. Not depressed or anxious appearing.  Calm demeanor.    Assessment and Plan:  Essential hypertension  Dyslipidemia - Plan: lovastatin (MEVACOR) 20 MG tablet  Vitamin D deficiency - Plan: Vitamin D, Ergocalciferol, (DRISDOL) 50000 units CAPS capsule  Medication monitoring encounter - Plan: Basic metabolic panel  BP is under ok control today- continue current BP regimen Vitamin D and cholesterol med provided today Plan to visit in 5-6 months   Signed Lamar Blinks, MD

## 2017-01-16 ENCOUNTER — Encounter: Payer: Self-pay | Admitting: Family Medicine

## 2017-01-16 LAB — BASIC METABOLIC PANEL
BUN: 15 mg/dL (ref 6–23)
CHLORIDE: 104 meq/L (ref 96–112)
CO2: 30 mEq/L (ref 19–32)
CREATININE: 0.99 mg/dL (ref 0.40–1.20)
Calcium: 9.7 mg/dL (ref 8.4–10.5)
GFR: 60.17 mL/min (ref >60.00)
Glucose, Bld: 103 mg/dL — ABNORMAL HIGH (ref 70–99)
Potassium: 3.7 mEq/L (ref 3.5–5.1)
Sodium: 141 mEq/L (ref 135–145)

## 2017-05-04 ENCOUNTER — Other Ambulatory Visit: Payer: Self-pay | Admitting: Family Medicine

## 2017-05-04 DIAGNOSIS — E559 Vitamin D deficiency, unspecified: Secondary | ICD-10-CM

## 2017-06-17 NOTE — Progress Notes (Addendum)
[place] [facility] [address] [place], [place] [address] [phone]  Date:  09/18/3783   Name:  [name]   DOB:  [birth year]   MRN:  [id number]  PCP:  Darreld Mclean, MD    Chief Complaint: Follow-up (Pt here for 5-6 month f/u visit. )   History of Present Illness:  [name] is a 61 y.o. very pleasant female patient who presents with the following:  Follow-up visit today History of colon cancer and DVT, HTN I last saw her in June- we had added a vitamin D supplement, cholesterol med, and HCTZ to her BP regimen   Flu: declined today She continues to have chronic leg pain- she has had this for several years at this time and we have never been able to find a cause  She did have a blood clot in the past associated with her colon cancer- however this was thought to have resolved She notes that both legs are very painful to touch, and they hurt when she walks or stands a lot- as she does at her job  Otherwise she is feeling well today and does not have any concerns Needs to follow-up on her labs today   Patient Active Problem List   Diagnosis Date Noted  . Bilateral leg pain 07/02/2016  . History of DVT of lower extremity 05/31/2016  . History of colon cancer, stage I 08/17/2015  . Varicose 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 08/26/2014  . HTN (hypertension) 12/24/2013  . Cancer of left colon ([place]) 02/25/2012  . DVT (deep venous thrombosis) ([place]) 08/27/2011    Past Medical History:  Diagnosis Date  . Cancer ([place]) 08/03/2010   colon  . DVT (deep venous thrombosis) ([place])   . Hypertension   . Leg DVT (deep venous thromboembolism), acute ([place])   . Uterine fibroid     Past Surgical History:  Procedure Laterality Date  . ABDOMINAL HYSTERECTOMY    . CHOLECYSTECTOMY    . COLON SURGERY    . GALLBLADDER SURGERY      Social History   Tobacco Use  . Smoking status: Never Smoker  .  Smokeless tobacco: Never Used  Substance Use Topics  . Alcohol use: No    Alcohol/week: 0.0 oz  . Drug use: No    Family History  Problem Relation Age of Onset  . Heart attack Mother   . Hypertension Father     No Known Allergies  Medication list has been reviewed and updated.  Current Outpatient Medications on File Prior to Visit  Medication Sig Dispense Refill  . hydrochlorothiazide (HYDRODIURIL) 25 MG tablet TAKE ONE TABLET BY MOUTH ONE TIME DAILY 90 tablet 3  . lisinopril (PRINIVIL,ZESTRIL) 40 MG tablet Take 1 tablet (40 mg total) by mouth daily. 90 tablet 3  . lovastatin (MEVACOR) 20 MG tablet Take 1 tablet (20 mg total) by mouth at bedtime. 90 tablet 3  . Vitamin D, Ergocalciferol, (DRISDOL) 50000 units CAPS capsule TAKE 1 CAPSULE BY MOUTH EVERY 7 (SEVEN) DAYS. (Patient not taking: Reported on 06/19/2017) 12 capsule 0   No current facility-administered medications on file prior to visit.     Review of Systems:  As per HPI- otherwise negative. No redness or swelling of her legs No fever or chills No nausea or vomiting   Physical Examination: Vitals:   06/19/17 1558  BP: (!) 166/85  Pulse: (!) 58  Temp: 98.1 F (36.7 C)  SpO2: 98%   Vitals:   06/19/17 1558  Weight: 197 lb 6.4 oz (89.5 kg)  Height: 5\' 7"  (1.702 m)   Body mass index is 30.92 kg/m. Ideal Body Weight: Weight in (lb) to have BMI = 25: 159.3  GEN: WDWN, NAD, Non-toxic, A & O x 3, overweight, tall build,.  Looks well  HEENT: Atraumatic, Normocephalic. Neck supple. No masses, No LAD. Ears and Nose: No external deformity. CV: RRR, No M/G/R. No JVD. No thrill. No extra heart sounds. PULM: CTA B, no wheezes, crackles, rhonchi. No retractions. No resp. distress. No accessory muscle use. EXTR: No c/c/e NEURO Normal gait.  PSYCH: Normally interactive. Conversant. Not depressed or anxious appearing.  Calm demeanor.  Legs appear normal- no swelling or redness.  However she does note tenderness most on  the medial calf and up behind the knee, bilaterally Normal ROM of both legs, normal perfusion   Assessment and Plan: Essential hypertension - Plan: Basic metabolic panel  Vitamin D deficiency - Plan: Vitamin D (25 hydroxy)  Dyslipidemia - Plan: Lipid panel  Medication monitoring encounter - Plan: Basic metabolic panel  Pain in both lower extremities - Plan: CK (Creatine Kinase), gabapentin (NEURONTIN) 300 MG capsule, US Venous Img Lower Bilateral  Was able to order a LE doppler for this evening - will rule out  Blood clot. Assuming this is negative plan to try Neurontin for her leg pain Labs pending BP well controlled Check vitamin D level  Signed Lamar Blinks, MD  11/9- received her doppler report  US Venous Img Lower Bilateral  Result Date: 06/19/2017 CLINICAL DATA:  Chronic leg pain and edema EXAM: BILATERAL LOWER EXTREMITY VENOUS DOPPLER ULTRASOUND TECHNIQUE: Gray-scale sonography with graded compression, as well as color Doppler and duplex ultrasound were performed to evaluate the lower extremity deep venous systems from the level of the common femoral vein and including the common femoral, femoral, profunda femoral, popliteal and calf veins including the posterior tibial, peroneal and gastrocnemius veins when visible. The superficial great saphenous vein was also interrogated. Spectral Doppler was utilized to evaluate flow at rest and with distal augmentation maneuvers in the common femoral, femoral and popliteal veins. COMPARISON:  06/03/2016 FINDINGS: RIGHT LOWER EXTREMITY Common Femoral Vein: No evidence of thrombus. Normal compressibility, respiratory phasicity and response to augmentation. Saphenofemoral Junction: No evidence of thrombus. Normal compressibility and flow on color Doppler imaging. Profunda Femoral Vein: No evidence of thrombus. Normal compressibility and flow on color Doppler imaging. Femoral Vein: No evidence of thrombus. Normal compressibility, respiratory  phasicity and response to augmentation. Popliteal Vein: No evidence of thrombus. Normal compressibility, respiratory phasicity and response to augmentation. Calf Veins: No evidence of thrombus. Normal compressibility and flow on color Doppler imaging. Superficial Great Saphenous Vein: No evidence of thrombus. Normal compressibility. Venous Reflux:  None. Other Findings:  None. LEFT LOWER EXTREMITY Common Femoral Vein: No evidence of thrombus. Normal compressibility, respiratory phasicity and response to augmentation. Saphenofemoral Junction: No evidence of thrombus. Normal compressibility and flow on color Doppler imaging. Profunda Femoral Vein: No evidence of thrombus. Normal compressibility and flow on color Doppler imaging. Femoral Vein: No evidence of thrombus. Normal compressibility, respiratory phasicity and response to augmentation. Popliteal Vein: No evidence of thrombus. Normal compressibility, respiratory phasicity and response to augmentation. Calf Veins: Small occlusive thrombus in the posterior tibial vein. No compressibility. Superficial Great Saphenous Vein: No evidence of thrombus. Normal compressibility. Venous Reflux:  None. Other Findings:  None. IMPRESSION: 1. Small occlusive thrombus within the left posterior tibial vein  consistent with below the knee venous thrombosis. 2. Negative for acute DVT in the right lower extremity. Electronically Signed   By: Donavan Foil M.D.   On: 06/19/2017 21:37   She does have a below the knee thrombus on the right  Recent renal function is normal She had a DVT in the past- 2005 Will plan to start xarelto for her- need to discuss with her Called pt at 8:15- did not reach. LMOM- will try her back later   Per oncology note from a year ago:  PROBLEM LIST: 1. Status post low anterior resection/partial colectomy on 08/03/2010 for a 6 cm mildly differentiated rectosigmoid adenocarcinoma. None of 16 lymph nodes sampled had evidence of malignancy.  Chemotherapy was not recommended. Her last colonoscopy was on 05/30/2011 and showed an anastomosis at 7 cm from the anal verge and large hemorrhoids. Repeat colonoscopy in 3 years.  2. HTN  3. Left lower extremity DVT (01/2004) s/p Coumadin therapy  4. Status post TAH with a history of uterine fibroids  5. Mammogram in 2012, she does not want screening mammogram due to the concern of x-ray exposure.   Called and reached her 11/9- she does have a small clot. She is ok with using xarelto for this. I will start her on the starter pack.  Will touch base with Dr. Burr Medico, her oncologist, about how long we should plan to anticoagulate  Heard back from Dr. Burr Medico:     Alfred Levins, I remember her. If this DVT episode was not provoked (no proceeding risk factors), yes I will keep her on xarelto indefinitely, if no high risk bleeding.    Pt called on 06/23/17- she could not get the xarelto as it was too expensive.  She has used coumadin in the past and would like to use this again.  Will start on 5 mg daily and recheck/ do INR ina few days.  Decided NOT to bridge with lovenox - there is a language barrier and I am concerned she will not be able to understand these instructions- esp over the phone- and could harm herself.  We also need to treat her low vitamin D.    Called pt 11/14 and LMOM- if she has started coumadin please come in for INR tomorrow or Friday.  Ordered for her

## 2017-06-19 ENCOUNTER — Ambulatory Visit (HOSPITAL_BASED_OUTPATIENT_CLINIC_OR_DEPARTMENT_OTHER)
Admission: RE | Admit: 2017-06-19 | Discharge: 2017-06-19 | Disposition: A | Discharge status: Home / Self Care | Payer: 59 | Source: Ambulatory Visit | Attending: Family Medicine | Admitting: Family Medicine

## 2017-06-19 ENCOUNTER — Ambulatory Visit (INDEPENDENT_AMBULATORY_CARE_PROVIDER_SITE_OTHER): Payer: 59 | Admitting: Family Medicine

## 2017-06-19 ENCOUNTER — Telehealth: Payer: Self-pay | Admitting: Internal Medicine

## 2017-06-19 ENCOUNTER — Encounter: Payer: Self-pay | Admitting: Family Medicine

## 2017-06-19 VITALS — BP 130/90 | HR 58 | Temp 98.1°F | Ht 67.0 in | Wt 197.4 lb

## 2017-06-19 DIAGNOSIS — E785 Hyperlipidemia, unspecified: Secondary | ICD-10-CM

## 2017-06-19 DIAGNOSIS — I10 Essential (primary) hypertension: Secondary | ICD-10-CM

## 2017-06-19 DIAGNOSIS — M79604 Pain in right leg: Secondary | ICD-10-CM

## 2017-06-19 DIAGNOSIS — Z7901 Long term (current) use of anticoagulants: Secondary | ICD-10-CM | POA: Diagnosis not present

## 2017-06-19 DIAGNOSIS — I82442 Acute embolism and thrombosis of left tibial vein: Secondary | ICD-10-CM | POA: Diagnosis not present

## 2017-06-19 DIAGNOSIS — E559 Vitamin D deficiency, unspecified: Secondary | ICD-10-CM

## 2017-06-19 DIAGNOSIS — M79605 Pain in left leg: Secondary | ICD-10-CM | POA: Diagnosis not present

## 2017-06-19 DIAGNOSIS — I82402 Acute embolism and thrombosis of unspecified deep veins of left lower extremity: Secondary | ICD-10-CM | POA: Diagnosis not present

## 2017-06-19 DIAGNOSIS — Z5181 Encounter for therapeutic drug level monitoring: Secondary | ICD-10-CM

## 2017-06-19 MED ORDER — GABAPENTIN 300 MG PO CAPS: 300.0000 mg | ORAL_CAPSULE | Freq: Three times a day (TID) | ORAL | 3 refills | Status: DC

## 2017-06-19 NOTE — Patient Instructions (Signed)
Your blood pressure looks good!  I am going to check on your labs today For your leg pain, we are going to get an ultrasound tonight at 7pm to make sure there is no clot. Please be sure to arrive in the imaging dept on the ground floor prior to 7pm so you can get in!  We will also try the gabapentin for leg pain Start with 1 pill daily on day 1, then twice a day on day 2, then three times a day  I will be in touch with your labs and ultrasound report asap

## 2017-06-19 NOTE — Telephone Encounter (Signed)
Just got a call regards [place] results, small thrombus below the L knee; called pt to let her know, she refused to discuss/talk with me  b/c is late (10.30 pm) Most likely will need ASA only for treatment , will notify ordering MD in AM regards findings  JP

## 2017-06-20 LAB — BASIC METABOLIC PANEL
BUN: 17 mg/dL (ref 6–23)
CHLORIDE: 99 meq/L (ref 96–112)
CO2: 33 mEq/L — ABNORMAL HIGH (ref 19–32)
Calcium: 9.9 mg/dL (ref 8.4–10.5)
Creatinine, Ser: 0.91 mg/dL (ref 0.40–1.20)
GFR: 66.23 mL/min (ref >60.00)
Glucose, Bld: 110 mg/dL — ABNORMAL HIGH (ref 70–99)
POTASSIUM: 3.8 meq/L (ref 3.5–5.1)
Sodium: 141 mEq/L (ref 135–145)

## 2017-06-20 LAB — CK: CK TOTAL: 218 U/L — AB (ref 7–177)

## 2017-06-20 LAB — VITAMIN D 25 HYDROXY (VIT D DEFICIENCY, FRACTURES): VITD: 12.5 ng/mL — AB (ref 30.00–100.00)

## 2017-06-20 MED ORDER — RIVAROXABAN (XARELTO) VTE STARTER PACK (15 & 20 MG): ORAL_TABLET | ORAL | 0 refills | Status: DC

## 2017-06-20 NOTE — Addendum Note (Signed)
Addended by: Lamar Blinks C on: 06/20/2017 05:13 PM   Modules accepted: Orders

## 2017-06-23 ENCOUNTER — Telehealth: Payer: Self-pay | Admitting: Family Medicine

## 2017-06-23 MED ORDER — WARFARIN SODIUM 5 MG PO TABS: 5.0000 mg | ORAL_TABLET | Freq: Every day | ORAL | 3 refills | Status: DC

## 2017-06-23 NOTE — Telephone Encounter (Signed)
Called [facility] back- it sounds like she has a high deductible for her medications. I do have some xarelto samples but this is not a good long term solution for her. May have to use coumadin

## 2017-06-23 NOTE — Telephone Encounter (Signed)
Patient walked in and brought a paper from her local pharm that the RX - Xarelto cost over  $700.00, patient is requesting to have something else that is cheaper. Please adv. And called patient once something else has been order.  Patient may be reached at [phone]  Thanks.

## 2017-06-25 NOTE — Addendum Note (Signed)
Addended by: Lamar Blinks C on: 06/25/2017 07:42 PM   Modules accepted: Orders

## 2017-06-27 ENCOUNTER — Telehealth: Payer: Self-pay | Admitting: Family Medicine

## 2017-06-27 NOTE — Telephone Encounter (Signed)
Pt states pharmacy advised her to contact [facility] to get authorization from insurance for [place]. Pt wants to take XARELTO.

## 2017-06-28 ENCOUNTER — Other Ambulatory Visit: Payer: Self-pay | Admitting: Family Medicine

## 2017-06-28 DIAGNOSIS — I10 Essential (primary) hypertension: Secondary | ICD-10-CM

## 2017-06-30 ENCOUNTER — Other Ambulatory Visit (INDEPENDENT_AMBULATORY_CARE_PROVIDER_SITE_OTHER): Payer: 59 | Admitting: Emergency Medicine

## 2017-06-30 ENCOUNTER — Other Ambulatory Visit: Payer: 59

## 2017-06-30 DIAGNOSIS — E559 Vitamin D deficiency, unspecified: Secondary | ICD-10-CM

## 2017-06-30 DIAGNOSIS — Z7901 Long term (current) use of anticoagulants: Secondary | ICD-10-CM

## 2017-06-30 DIAGNOSIS — Z5181 Encounter for therapeutic drug level monitoring: Secondary | ICD-10-CM

## 2017-06-30 LAB — POCT INR: INR: 1.1

## 2017-06-30 MED ORDER — VITAMIN D (ERGOCALCIFEROL) 1.25 MG (50000 UNIT) PO CAPS: ORAL_CAPSULE | ORAL | 0 refills | Status: DC

## 2017-06-30 NOTE — Telephone Encounter (Signed)
Sent to provider for approval due to this medication not being on pt's current med list.

## 2017-06-30 NOTE — Telephone Encounter (Signed)
Please put her on the nurse visit for a week from today (next Monday) after 3pm.  Thank you!!

## 2017-06-30 NOTE — Telephone Encounter (Signed)
Pt came in- we will check her INR today She is taking coumadin 5 mg- she has been taking this for one week INR today is 1.1  She is taking 35 mg of coumadin weekly We will increase by 15% or 5 mg a week- take 5 mg 5 days of the week and 7.5 mg the other 2 days of the week Repeat INR in one week.

## 2017-07-01 NOTE — Telephone Encounter (Signed)
Patient scheduled for 07/08/17.

## 2017-07-08 ENCOUNTER — Ambulatory Visit (INDEPENDENT_AMBULATORY_CARE_PROVIDER_SITE_OTHER): Payer: 59

## 2017-07-08 DIAGNOSIS — Z7901 Long term (current) use of anticoagulants: Secondary | ICD-10-CM

## 2017-07-08 LAB — POCT INR
INR: 1.7
INR: 1.7

## 2017-07-08 NOTE — Patient Instructions (Signed)
Per Dr [name] patient to take Coimadin 7.5 mg today then start 7.5 mg on Monday, Wednesday and Friday and take Coumadin 5 mg all other days. Return for INR check on December 7th 2018.

## 2017-07-08 NOTE — Progress Notes (Signed)
Pre visit review using our clinic tool,if applicable. No additional management support is needed unless otherwise documented below in the visit note.   Patient in for INR check per order from Dr. Lorelei Pont.  No complaints voiced. INR last visit was 1.1  Patient states she is taking Coumadin 7.5 mg days weekly and 5 mg all other days. States she cannot remember which days.   INR today = 1.7 Goal = 2.0-3.0  Per Dr. Lawson Radar patient o take Coumadin 7.5 mg 3 days weekly on Monday Wednesday and Friday and 5 mg all other days and return  Friday for repeat INR.

## 2017-07-11 ENCOUNTER — Ambulatory Visit: Payer: 59

## 2017-07-16 ENCOUNTER — Ambulatory Visit: Payer: 59

## 2017-07-18 ENCOUNTER — Ambulatory Visit (INDEPENDENT_AMBULATORY_CARE_PROVIDER_SITE_OTHER): Payer: 59

## 2017-07-18 DIAGNOSIS — Z7901 Long term (current) use of anticoagulants: Secondary | ICD-10-CM

## 2017-07-18 LAB — POCT INR: INR: 2.2

## 2017-07-18 NOTE — Progress Notes (Signed)
Ann Held, DO

## 2017-07-18 NOTE — Progress Notes (Signed)
Pre visit review using our clinic tool,if applicable. No additional management support is needed unless otherwise documented below in the visit note.   Patient in for INR check per order from Dr. Carollee Herter dated 07/08/17.  Patient taking Coumadin 7.5 mg on Monday,Wednesday and Friday and 5 mg all other days.  Complains of pains in legs today.   INR= 2.2  Per Dr. Alessandra Bevels to   Continue takin Coumadin as ordered and return in 1 month.

## 2017-08-19 ENCOUNTER — Ambulatory Visit (INDEPENDENT_AMBULATORY_CARE_PROVIDER_SITE_OTHER): Payer: 59

## 2017-08-19 DIAGNOSIS — Z7901 Long term (current) use of anticoagulants: Secondary | ICD-10-CM

## 2017-08-19 LAB — POCT INR: INR: 1.5

## 2017-08-19 NOTE — Progress Notes (Signed)
Last INR was 07/18/2017, was 2.2. INR today 1.5. No missing doses Plan: Continue Coumadin 7.5 mg Monday Wednesday and Friday 5 mg other days Today is Tuesday, instead of 5 take 10 mg INR in 2 weeks JP

## 2017-08-19 NOTE — Progress Notes (Signed)
Pre visit review using our clinic tool,if applicable. No additional management support is needed unless otherwise documented below in the visit note.   Patient in for INR check per order from Dr. Lenna Sciara [place].  Patient findings negative today.  Currently taking Coumadin 7.5 mg Monday Wednesday and Friday and 5 mg all other days.  INR last visit = 2.2   INR today = 1.5  Goal = 2.0-3.0  Per Dr. Larose Kells DOD patient to take Coumadin 10 mg today then resume 7.5 mg on Monday Wednesday and Friday and 5 mg all other days.  Return for INR check in 2 weeks.

## 2017-08-19 NOTE — Patient Instructions (Signed)
Per Dr. Larose Kells DOD patient to take Coumadin 10 mg today then resume 7.5 mg on Monday Wednesday and Friday and 5 mg all other days.  Return for INR check in 2 weeks.

## 2017-09-02 ENCOUNTER — Emergency Department (HOSPITAL_BASED_OUTPATIENT_CLINIC_OR_DEPARTMENT_OTHER): Admission: EM | Admit: 2017-09-02 | Discharge: 2017-09-02 | Discharge status: Absent without leave | Payer: 59

## 2017-09-02 ENCOUNTER — Ambulatory Visit (INDEPENDENT_AMBULATORY_CARE_PROVIDER_SITE_OTHER): Payer: 59

## 2017-09-02 DIAGNOSIS — Z7901 Long term (current) use of anticoagulants: Secondary | ICD-10-CM

## 2017-09-02 LAB — POCT INR: INR: 1.7

## 2017-09-02 NOTE — Patient Instructions (Signed)
Per Dr. Larose Kells take 7.5 mg on Tuesady Thursday and Saturday and Sunday 5 mg on Monday Wednesday and Friday.  Return for INR check in 2 weeks.

## 2017-09-02 NOTE — Progress Notes (Signed)
Pre visit review using our clinic tool,if applicable. No additional management support is needed unless otherwise documented below in the visit note.   Patient in for INR check per order from Dr. Lorelei Pont.  Patient taking Coumadin 7.5 mg on Monday ,Wed,Fri and 5 mg on Tues,Thurs,Sat and Sun  Patient complaining of pain in her chest and tightness around her neck and chest area.  Per Dr. Larose Kells patient to be seen in ED for evaluation. Patient agreed.  INR last visit was 1.5   INR today = 1.7  Goal = 2.0-3.0  Per Dr. Larose Kells, DOD  Patient to take 7.5 mg on Tuesday Thursday Saturday and Sunday and 5 mg on Monday Wednesday and Friday.   Return for OV in 2 weeks.

## 2017-09-02 NOTE — Progress Notes (Signed)
Current dose: Coumadin 7.5 mg Monday Wednesday and Friday 5 mg other days 42.5 mg weekly  INR today 1.7, still low. Plan: Coumadin 7.5 daily 5 mg Monday Wednesday and Friday 45 mg/week INR in 2 weeks JP

## 2017-09-04 ENCOUNTER — Emergency Department (HOSPITAL_BASED_OUTPATIENT_CLINIC_OR_DEPARTMENT_OTHER): Payer: 59

## 2017-09-04 ENCOUNTER — Encounter (HOSPITAL_BASED_OUTPATIENT_CLINIC_OR_DEPARTMENT_OTHER): Payer: Self-pay | Admitting: *Deleted

## 2017-09-04 ENCOUNTER — Emergency Department (HOSPITAL_BASED_OUTPATIENT_CLINIC_OR_DEPARTMENT_OTHER)
Admission: EM | Admit: 2017-09-04 | Discharge: 2017-09-04 | Disposition: A | Discharge status: Home / Self Care | Payer: 59 | Attending: Emergency Medicine | Admitting: Emergency Medicine

## 2017-09-04 ENCOUNTER — Encounter: Payer: Self-pay | Admitting: Family Medicine

## 2017-09-04 ENCOUNTER — Telehealth: Payer: Self-pay | Admitting: Cardiovascular Disease

## 2017-09-04 ENCOUNTER — Other Ambulatory Visit: Payer: Self-pay

## 2017-09-04 ENCOUNTER — Ambulatory Visit (INDEPENDENT_AMBULATORY_CARE_PROVIDER_SITE_OTHER): Payer: 59 | Admitting: Family Medicine

## 2017-09-04 VITALS — BP 170/90 | HR 70 | Temp 97.9°F | Wt 201.8 lb

## 2017-09-04 DIAGNOSIS — R0789 Other chest pain: Secondary | ICD-10-CM | POA: Diagnosis not present

## 2017-09-04 DIAGNOSIS — Z7901 Long term (current) use of anticoagulants: Secondary | ICD-10-CM | POA: Diagnosis not present

## 2017-09-04 DIAGNOSIS — J9811 Atelectasis: Secondary | ICD-10-CM | POA: Diagnosis not present

## 2017-09-04 DIAGNOSIS — R079 Chest pain, unspecified: Secondary | ICD-10-CM

## 2017-09-04 DIAGNOSIS — J9859 Other diseases of mediastinum, not elsewhere classified: Secondary | ICD-10-CM | POA: Insufficient documentation

## 2017-09-04 DIAGNOSIS — Z79899 Other long term (current) drug therapy: Secondary | ICD-10-CM | POA: Insufficient documentation

## 2017-09-04 DIAGNOSIS — Z85038 Personal history of other malignant neoplasm of large intestine: Secondary | ICD-10-CM | POA: Insufficient documentation

## 2017-09-04 DIAGNOSIS — I10 Essential (primary) hypertension: Secondary | ICD-10-CM | POA: Diagnosis not present

## 2017-09-04 LAB — COMPREHENSIVE METABOLIC PANEL
ALT: 29 U/L (ref 14–54)
AST: 28 U/L (ref 15–41)
Albumin: 4.6 g/dL (ref 3.5–5.0)
Alkaline Phosphatase: 82 U/L (ref 38–126)
Anion gap: 8 (ref 5–15)
BUN: 15 mg/dL (ref 6–20)
CHLORIDE: 103 mmol/L (ref 101–111)
CO2: 30 mmol/L (ref 22–32)
Calcium: 9.3 mg/dL (ref 8.9–10.3)
Creatinine, Ser: 0.85 mg/dL (ref 0.44–1.00)
Glucose, Bld: 113 mg/dL — ABNORMAL HIGH (ref 65–99)
POTASSIUM: 3.3 mmol/L — AB (ref 3.5–5.1)
Sodium: 141 mmol/L (ref 135–145)
Total Bilirubin: 0.6 mg/dL (ref 0.3–1.2)
Total Protein: 7.3 g/dL (ref 6.5–8.1)

## 2017-09-04 LAB — CBC WITH DIFFERENTIAL/PLATELET
Basophils Absolute: 0 10*3/uL (ref 0.0–0.1)
Basophils Relative: 1 %
EOS PCT: 2 %
Eosinophils Absolute: 0.1 10*3/uL (ref 0.0–0.7)
HCT: 39.3 % (ref 36.0–46.0)
Hemoglobin: 13.2 g/dL (ref 12.0–15.0)
LYMPHS ABS: 2 10*3/uL (ref 0.7–4.0)
Lymphocytes Relative: 43 %
MCH: 31.2 pg (ref 26.0–34.0)
MCHC: 33.6 g/dL (ref 30.0–36.0)
MCV: 92.9 fL (ref 78.0–100.0)
MONO ABS: 0.3 10*3/uL (ref 0.1–1.0)
MONOS PCT: 7 %
Neutro Abs: 2.2 10*3/uL (ref 1.7–7.7)
Neutrophils Relative %: 47 %
PLATELETS: 174 10*3/uL (ref 150–400)
RBC: 4.23 MIL/uL (ref 3.87–5.11)
RDW: 12.5 % (ref 11.5–15.5)
WBC: 4.5 10*3/uL (ref 4.0–10.5)

## 2017-09-04 LAB — TROPONIN I: Troponin I: <0.03 ng/mL (ref <0.03)

## 2017-09-04 LAB — PROTIME-INR
INR: 1.69
Prothrombin Time: 19.7 seconds — ABNORMAL HIGH (ref 11.4–15.2)

## 2017-09-04 LAB — D-DIMER, QUANTITATIVE (NOT AT ARMC)

## 2017-09-04 LAB — POCT INR: INR: 1.6

## 2017-09-04 MED ORDER — POTASSIUM CHLORIDE CRYS ER 20 MEQ PO TBCR
20.0000 meq | EXTENDED_RELEASE_TABLET | Freq: Once | ORAL | Status: AC
  Administered 2017-09-04: 20 meq via ORAL
  Filled 2017-09-04: qty 1

## 2017-09-04 MED ORDER — LISINOPRIL 10 MG PO TABS
40.0000 mg | ORAL_TABLET | Freq: Once | ORAL | Status: AC
  Administered 2017-09-04: 40 mg via ORAL
  Filled 2017-09-04: qty 4

## 2017-09-04 MED ORDER — IOPAMIDOL (ISOVUE-300) INJECTION 61%
100.0000 mL | Freq: Once | INTRAVENOUS | Status: AC | PRN
  Administered 2017-09-04: 80 mL via INTRAVENOUS

## 2017-09-04 NOTE — ED Provider Notes (Signed)
[facility] EMERGENCY DEPARTMENT Provider Note   CSN: [id number] Arrival date & time: 09/04/17  1424     History   Chief Complaint Chief Complaint  Patient presents with  . Chest Pain    HPI Corleen Otwell is a 62 y.o. female.  Patient is a 62 year old female with a history of colon cancer, DVT, hypertension and hyperlipidemia presenting today from her PCP office with intermittent chest pain over the last 2 weeks.  Patient states sometimes she will get 2-3 episodes a day that usually only last between 2-5 minutes.  It does not seem to be related to exertion, eating or any particular activity.  She states it just comes and goes when it pleases.  She describes it as a pressure sensation always in the same spot in her left chest.  She states on rare occasion she might feel a little tightness in her throat.  She denies any radiation into her arm or back.  She denies any shortness of breath, nausea, vomiting, cough, fever.  She has had no leg swelling.  She has been taking her Coumadin regularly but states her INRs have still been low.  She denies any new leg pain or swelling.  She is not sure why she developed the clot to start with.  No significant family history of cardiac disease.   The history is provided by the patient.    Past Medical History:  Diagnosis Date  . Cancer ([place]) 08/03/2010   colon  . DVT (deep venous thrombosis) ([place])   . Hypertension   . Leg DVT (deep venous thromboembolism), acute ([place])   . Uterine fibroid     Patient Active Problem List   Diagnosis Date Noted  . Bilateral leg pain 07/02/2016  . History of DVT of lower extremity 05/31/2016  . History of colon cancer, stage I 08/17/2015  . Varicose 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 08/26/2014  . HTN (hypertension) 12/24/2013  . Cancer of left colon ([place]) 02/25/2012  . DVT (deep venous thrombosis) ([place]) 08/27/2011    Past Surgical History:  Procedure Laterality Date  . ABDOMINAL  HYSTERECTOMY    . CHOLECYSTECTOMY    . COLON SURGERY    . GALLBLADDER SURGERY      OB History    No data available       Home Medications    Prior to Admission medications   Medication Sig Start Date End Date Taking? Authorizing Provider  gabapentin (NEURONTIN) 300 MG capsule Take 1 capsule (300 mg total) 3 (three) times daily by mouth. 06/19/17   Copland, Gay Filler, MD  hydrochlorothiazide (HYDRODIURIL) 25 MG tablet TAKE ONE TABLET BY MOUTH ONE TIME DAILY 12/18/16   Copland, Gay Filler, MD  lisinopril (PRINIVIL,ZESTRIL) 40 MG tablet TAKE 1 TABLET (40 MG TOTAL) BY MOUTH DAILY. 06/30/17   Copland, Gay Filler, MD  lovastatin (MEVACOR) 20 MG tablet Take 1 tablet (20 mg total) by mouth at bedtime. 01/15/17   Copland, Gay Filler, MD  Vitamin D, Ergocalciferol, (DRISDOL) 50000 units CAPS capsule TAKE 1 CAPSULE BY MOUTH EVERY 7 (SEVEN) DAYS. 06/30/17   Copland, Gay Filler, MD  warfarin (COUMADIN) 5 MG tablet Take 1 tablet (5 mg total) daily by mouth. Adjust as directed by MD 06/23/17   Copland, Gay Filler, MD    Family History Family History  Problem Relation Age of Onset  . Heart attack Mother   . Hypertension Father     Social History Social History   Tobacco Use  .  Smoking status: Never Smoker  . Smokeless tobacco: Never Used  Substance Use Topics  . Alcohol use: No    Alcohol/week: 0.0 oz  . Drug use: No     Allergies   Patient has no known allergies.   Review of Systems Review of Systems  All other systems reviewed and are negative.    Physical Exam Updated Vital Signs BP (!) 162/86 (BP Location: Right Arm)   Pulse (!) 55   Temp 98.2 F (36.8 C) (Oral)   Resp 18   Ht 5\' 7"  (1.702 m)   SpO2 100%   BMI 31.61 kg/m   Physical Exam  Constitutional: She is oriented to person, place, and time. She appears well-developed and well-nourished. No distress.  HENT:  Head: Normocephalic and atraumatic.  Mouth/Throat: Oropharynx is clear and moist.  Eyes: Conjunctivae and  EOM are normal. Pupils are equal, round, and reactive to light.  Neck: Normal range of motion. Neck supple.  Cardiovascular: Normal rate, regular rhythm and intact distal pulses.  No murmur heard. Pulmonary/Chest: Effort normal and breath sounds normal. No respiratory distress. She has no wheezes. She has no rales.  Abdominal: Soft. She exhibits no distension. There is no tenderness. There is no rebound and no guarding.  Musculoskeletal: Normal range of motion. She exhibits no edema or tenderness.       Right lower leg: Normal. She exhibits no tenderness and no edema.       Left lower leg: Normal. She exhibits no tenderness and no edema.  Neurological: She is alert and oriented to person, place, and time.  Skin: Skin is warm and dry. No rash noted. No erythema.  Psychiatric: She has a normal mood and affect. Her behavior is normal.  Nursing note and vitals reviewed.    ED Treatments / Results  Labs (all labs ordered are listed, but only abnormal results are displayed) Labs Reviewed  COMPREHENSIVE METABOLIC PANEL - Abnormal; Notable for the following components:      Result Value   Potassium 3.3 (*)    Glucose, Bld 113 (*)    All other components within normal limits  PROTIME-INR - Abnormal; Notable for the following components:   Prothrombin Time 19.7 (*)    All other components within normal limits  CBC WITH DIFFERENTIAL/PLATELET  D-DIMER, QUANTITATIVE (NOT AT [facility])  TROPONIN I    EKG  EKG Interpretation  Date/Time:  Thursday September 04 2017 14:32:13 EST Ventricular Rate:  57 PR Interval:  146 QRS Duration: 86 QT Interval:  462 QTC Calculation: 449 R Axis:   50 Text Interpretation:  Sinus bradycardia Otherwise normal ECG No significant change since last tracing Confirmed by Blanchie Dessert [phone]) on 09/04/2017 3:23:29 PM       Radiology Dg Chest 2 View  Result Date: 09/04/2017 CLINICAL DATA:  Left-sided chest pain and pressure sensation for the past 2 weeks.  History of colonic malignancy, deep venous thrombosis, hypertension, nonsmoker. EXAM: CHEST  2 VIEW COMPARISON:  PA chest x-ray dated June 05, 2012. FINDINGS: The lungs are adequately inflated. There is soft tissue density in the mid retrosternal region which is more conspicuous than on the CT scan of March 10, 2013. This likely reflects an enlarged lymph node but its radiographic appearance on today's study is nonspecific. There is no alveolar infiltrate. There is no pleural effusion. The heart and pulmonary vascularity are normal. The mediastinum is normal in width. The bony thorax exhibits no acute abnormality. There calcification in the wall of the aortic  arch. IMPRESSION: Probable retrosternal lymphadenopathy.  No acute pneumonia nor CHF. Thoracic aortic atherosclerosis. Contrast-enhanced chest CT scanning is recommended. Electronically Signed   By: David  Martinique M.D.   On: 09/04/2017 15:52   Ct Chest W Contrast  Result Date: 09/04/2017 CLINICAL DATA:  Abnormal chest radiograph with question retrosternal adenopathy; history of colon cancer, hypertension, nonsmoker EXAM: CT CHEST WITH CONTRAST TECHNIQUE: Multidetector CT imaging of the chest was performed during intravenous contrast administration. CONTRAST:  58mL ISOVUE-300 IOPAMIDOL (ISOVUE-300) INJECTION 61% IV COMPARISON:  03/10/2013; correlation chest radiograph 09/04/2017 FINDINGS: Cardiovascular: Mild atherosclerotic calcification aorta without aneurysmal dilatation. Pulmonary arteries grossly patent on non targeted exam. No pericardial effusion. Mediastinum/Nodes: Small calcified nodule within RIGHT thyroid lobe unchanged. Remaining visualized base of cervical region unremarkable. Anterior mediastinal mass 3.4 x 2.6 x 2.6 cm question enlarged lymph node versus mass. This measured 2.2 x 0.9 x 1.5 cm on 03/10/2013 and appear to represent a lymph node. No additional thoracic adenopathy. Few additional scattered normal size mediastinal lymph nodes  identified. Lungs/Pleura: Bibasilar atelectasis. Lungs otherwise clear. No pleural effusion or pneumothorax. Upper Abdomen: Gallbladder surgically absent. Visualized upper abdomen otherwise unremarkable. Musculoskeletal: No acute osseous findings. IMPRESSION: Anterior mediastinal soft tissue mass 3.4 x 2.6 x 2.6 cm question enlarged lymph node versus other soft tissue mass, increased since 2014 when it appeared to represent a 9 mm short axis lymph node. This could represent a metastatic lymph node in a patient with a history of colon cancer, potentially lymphoma, other nonspecific soft tissue tumor, less likely to be of thymic or thyroid origin; further evaluation by PET-CT recommended. Aortic Atherosclerosis (ICD10-I70.0). Electronically Signed   By: Lavonia Dana M.D.   On: 09/04/2017 17:35    Procedures Procedures (including critical care time)  Medications Ordered in ED Medications  lisinopril (PRINIVIL,ZESTRIL) tablet 40 mg (not administered)     Initial Impression / Assessment and Plan / ED Course  I have reviewed the triage vital signs and the nursing notes.  Pertinent labs & imaging results that were available during my care of the patient were reviewed by me and considered in my medical decision making (see chart for details).    Patient is a pleasant 62 year old female with atypical chest pain today presenting from her PCP office with a history of nonspecific chest pressure that is been present for the last 2 weeks.  It is not exertional.  EKG in the office showed slight non-specific ST depression and her PCP spoke with cardiology who recommended she come to the emergency room for rule out.  Patient is a non-smoker and has no significant family history of cardiac disease.  Her EKG is reassuring here and it appears similar to prior EKGs.  Patient is not currently having pain.  She does not relate it to eating.  She also denies any infectious symptoms which would be concerning for pneumonia.   Low suspicion for dissection, pneumonia, pneumothorax.  Lower suspicion for ACS and the heart score of 2 so low risk.  Also concern for possible PE as patient's Coumadin has been subtherapeutic and she has history of DVT.  Vital signs are reassuring except for hypertension.  Patient states she normally takes her daily dose of lisinopril around this time and she was given her home med. CBC, CMP, troponin, INR, d-dimer, chest x-ray pending.  5:01 PM Labs are reassuring except for mild hypokalemia of 3.3.  Normal d-dimer and troponin.  CBC with normal hemoglobin.  INR still subtherapeutic at 1.69.  Chest x-ray  shows probable retrosternal lymphadenopathy without other acute findings and recommended CT scan with contrast.  6:05 PM X-ray showing an anterior mediastinal soft tissue mass question whether this is enlarged lymph node versus a soft tissue mass.  Recommended PET CT and possible biopsy in the future.  Findings discussed with the patient and she will follow-up with Dr. Edilia Bo for further referrals.  Also she most likely will follow up with cardiology for stress testing but at this time troponin is negative and low suspicion for ACS.  Final Clinical Impressions(s) / ED Diagnoses   Final diagnoses:  Atypical chest pain  Mediastinal mass    ED Discharge Orders    None       Blanchie Dessert, MD 09/04/17 1807

## 2017-09-04 NOTE — Progress Notes (Addendum)
[facility] [address] [place], [place], [place] [address] [phone]  Date:  0/30/0923   Name:  [name]   DOB:  1956/05/03   MRN:  [id number]  PCP:  Darreld Mclean, MD    Chief Complaint: Chest Pain (c/o chest pain that comes and goes that started 3-4 weeks ago. )   History of Present Illness:  [name] is a 62 y.o. very pleasant female patient who presents with the following:  Last seen by myself in November History of HTN, colon cancer (stage 1, treated with surgery in 2011), DVT (2005) Back in November she was found to have a small DVT and I started her on coumadin (could not afford xarelto) Lab Results  Component Value Date   INR 1.7 09/02/2017   INR 1.7. 09/02/2017   INR 1.5 08/19/2017   She has been getting her INR checked but is still out of range On Monday we adjusted her INR  When here for her INR on Monday she was complaining of chest and neck tightness, and was directed to go to the ER for evaluation. However she did not go to be seen as she was worried about cost  She reports chest pains that may occur 3-5 times a day, and may last 2- 20 minutes She has had this for 3-4 weeks Never had in the past  The pain seems to be over her left breast and can also run into her neck Most recent episode on the way here today- about 20 minutes ago.  Lasted for 3-4 minutes The pain can occur with exercise or at rest   Pt has no history of heart problems, and there is no CAD in her family as far as she knows She is taking her coumadin Never a smoker  She also reports a couple of broken teeth which she is in the process of getting fixed- wonders if this might be related   Results for orders placed or performed in visit on 09/04/17  POCT INR  Result Value Ref Range   INR 1.6      Patient Active Problem List   Diagnosis Date Noted  . Bilateral leg pain 07/02/2016  . History of DVT of lower  extremity 05/31/2016  . History of colon cancer, stage I 08/17/2015  . Varicose 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 08/26/2014  . HTN (hypertension) 12/24/2013  . Cancer of left colon ([place]) 02/25/2012  . DVT (deep venous thrombosis) ([place]) 08/27/2011    Past Medical History:  Diagnosis Date  . Cancer ([place]) 08/03/2010   colon  . DVT (deep venous thrombosis) ([place])   . Hypertension   . Leg DVT (deep venous thromboembolism), acute ([place])   . Uterine fibroid     Past Surgical History:  Procedure Laterality Date  . ABDOMINAL HYSTERECTOMY    . CHOLECYSTECTOMY    . COLON SURGERY    . GALLBLADDER SURGERY      Social History   Tobacco Use  . Smoking status: Never Smoker  . Smokeless tobacco: Never Used  Substance Use Topics  . Alcohol use: No    Alcohol/week: 0.0 oz  . Drug use: No    Family History  Problem Relation Age of Onset  . Heart attack Mother   . Hypertension Father     No Known Allergies  Medication list has been reviewed and updated.  Current Outpatient Medications on File Prior to Visit  Medication Sig Dispense Refill  . gabapentin (NEURONTIN) 300 MG capsule Take 1 capsule (300 mg total) 3 (three) times daily by mouth. 90 capsule 3  . hydrochlorothiazide (HYDRODIURIL) 25 MG tablet TAKE ONE TABLET BY MOUTH ONE TIME DAILY 90 tablet 3  . lisinopril (PRINIVIL,ZESTRIL) 40 MG tablet TAKE 1 TABLET (40 MG TOTAL) BY MOUTH DAILY. 90 tablet 1  . lovastatin (MEVACOR) 20 MG tablet Take 1 tablet (20 mg total) by mouth at bedtime. 90 tablet 3  . Vitamin D, Ergocalciferol, (DRISDOL) 50000 units CAPS capsule TAKE 1 CAPSULE BY MOUTH EVERY 7 (SEVEN) DAYS. 12 capsule 0  . warfarin (COUMADIN) 5 MG tablet Take 1 tablet (5 mg total) daily by mouth. Adjust as directed by MD 30 tablet 3   No current facility-administered medications on file prior to visit.     Review of Systems:  As per HPI- otherwise negative.  BP Readings from Last 3 Encounters:  09/04/17 (!) 170/90   06/19/17 130/90  01/15/17 140/85    Physical Examination: Vitals:   09/04/17 1327  BP: (!) 170/90  Pulse: 70  Temp: 97.9 F (36.6 C)  SpO2: 99%   Vitals:   09/04/17 1327  Weight: 201 lb 12.8 oz (91.5 kg)   Body mass index is 31.61 kg/m. Ideal Body Weight:    GEN: WDWN, NAD, Non-toxic, A & O x 3, obese, otherwise looks well  HEENT: Atraumatic, Normocephalic. Neck supple. No masses, No LAD. Ears and Nose: No external deformity. CV: RRR, No M/G/R. No JVD. No thrill. No extra heart sounds. PULM: CTA B, no wheezes, crackles, rhonchi. No retractions. No resp. distress. No accessory muscle use. ABD: S, NT, ND, +BS. No rebound. No HSM. EXTR: No c/c/e NEURO Normal gait.  PSYCH: Normally interactive. Conversant. Not depressed or anxious appearing.  Calm demeanor.   EKG:  Mild ST depression in II, V4/5.  Discussed with Dr. Loletha Grayer, DOD at [facility] cardiology Decided to have her ruled- out for CP  Assessment and Plan: Chest pain, unspecified type - Plan: EKG 12-Lead  Anticoagulant long-term use - Plan: POCT INR  Here today with CP Concerning findings on EKG today and history of suspicious chest pain Referral to ER now for evaluation Gave her a savings card for xarelto -would like to get her off coumadin if we can.  She will try and activate this. Did not adjust coumadin right now as she is going to ER- will call her to go over this   Signed Lamar Blinks, MD  Pt was seen and released by ER - called her to go over her coumadin  Lab Results  Component Value Date   INR 1.69 09/04/2017   INR 1.6 09/04/2017   INR 1.7 09/02/2017   She is taking 7.5  4 days and 5 mg the other 3 days,  She is subtherapeutic.   Will have her take 7.5 4 days and 5 the other 2 days Will recheck here in one week- at that time can repeat her INR, an discuss her chest pain and also possible mediastinal mass seen at ER on CXR

## 2017-09-04 NOTE — ED Triage Notes (Signed)
Pt. Came from upstairs with complete notes in chart.  Pt. ECG WNL in triage and shown to EDP ISAACS   Pt. In no distress in triage.  Pt. Reports pain in L boob pain and at times 3/10 to 4/10 and no stabbing pain just pressure.  Pt. Reports no shob and no sweating.

## 2017-09-05 ENCOUNTER — Telehealth: Payer: Self-pay | Admitting: Emergency Medicine

## 2017-09-05 NOTE — Telephone Encounter (Signed)
Tried to contact pt to schedule a hosp f/u appt per provider request. No answer, left message for pt to call back to get schedule for next Thursday 09/11/17 with Dr. Lorelei Pont.

## 2017-09-10 NOTE — Progress Notes (Signed)
[facility] [address], [place], [place] [address] [phone]  Date:  9/48/5462   Name:  [name]   DOB:  Apr 15, 1956   MRN:  [id number]  PCP:  Darreld Mclean, MD    Chief Complaint: Follow-up (Pt here for f/u visit. )   History of Present Illness:  [name] is a 62 y.o. very pleasant female patient who presents with the following: History of DVT, colon cancer I saw her on 1/24 and referred her to the ER for CP- per ER notes:  Patient is a pleasant 62 year old female with atypical chest pain today presenting from her PCP office with a history of nonspecific chest pressure that is been present for the last 2 weeks.  It is not exertional.  EKG in the office showed slight non-specific ST depression and her PCP spoke with cardiology who recommended she come to the emergency room for rule out.  Patient is a non-smoker and has no significant family history of cardiac disease.  Her EKG is reassuring here and it appears similar to prior EKGs.  Patient is not currently having pain.  She does not relate it to eating.  She also denies any infectious symptoms which would be concerning for pneumonia.  Low suspicion for dissection, pneumonia, pneumothorax.  Lower suspicion for ACS and the heart score of 2 so low risk.  Also concern for possible PE as patient's Coumadin has been subtherapeutic and she has history of DVT.  Vital signs are reassuring except for hypertension.  Patient states she normally takes her daily dose of lisinopril around this time and she was given her home med. CBC, CMP, troponin, INR, d-dimer, chest x-ray pending. 5:01 PM Labs are reassuring except for mild hypokalemia of 3.3.  Normal d-dimer and troponin.  CBC with normal hemoglobin.  INR still subtherapeutic at 1.69.  Chest x-ray shows probable retrosternal lymphadenopathy without other acute findings and recommended CT scan with contrast. 6:05  PM X-ray showing an anterior mediastinal soft tissue mass question whether this is enlarged lymph node versus a soft tissue mass.  Recommended PET CT and possible biopsy in the future.  Findings discussed with the patient and she will follow-up with Dr. Edilia Bo for further referrals.  Also she most likely will follow up with cardiology for stress testing but at this time troponin is negative and low suspicion for ACS.  CT chest 1/24-  IMPRESSION: Anterior mediastinal soft tissue mass 3.4 x 2.6 x 2.6 cm question enlarged lymph node versus other soft tissue mass, increased since 2014 when it appeared to represent a 9 mm short axis lymph node.  This could represent a metastatic lymph node in a patient with a history of colon cancer, potentially lymphoma, other nonspecific soft tissue tumor, less likely to be of thymic or thyroid origin; further evaluation by PET-CT recommended.   Discussed this finding with her in detail.  Of course the concern is that this may be mets from her colon cancer from years ago.  Called CT surgery and spoke with RN there- she gave me very helpful advice; will schedule PET scan and also set her up with a CT surgery appt.  Reassured her that we will get more information about this mediastinal finding asap Also will refer her to cardiology for further evaluation of her chest discomfort.  [name] relates that her chest pain symptoms are about the same as when she came in to see me  on 1/76  [name] ran out of coumadin for a few days but started back on it 48 hours ago.  Her hematologist/ oncologist has recommended long term anticoagulation as she has had more than 1 DVT now.  Pt has tried to get set up with xarelto but has not been able to do so as of yet.  I called the [facility] that she wishes to use and got this set up for her    Patient Active Problem List   Diagnosis Date Noted  . Bilateral leg pain 07/02/2016  . History of DVT of lower extremity 05/31/2016  .  History of colon cancer, stage I 08/17/2015  . Varicose 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 08/26/2014  . HTN (hypertension) 12/24/2013  . Cancer of left colon ([place]) 02/25/2012  . DVT (deep venous thrombosis) ([place]) 08/27/2011    Past Medical History:  Diagnosis Date  . Cancer ([place]) 08/03/2010   colon  . DVT (deep venous thrombosis) ([place])   . Hypertension   . Leg DVT (deep venous thromboembolism), acute ([place])   . Uterine fibroid     Past Surgical History:  Procedure Laterality Date  . ABDOMINAL HYSTERECTOMY    . CHOLECYSTECTOMY    . COLON SURGERY    . GALLBLADDER SURGERY      Social History   Tobacco Use  . Smoking status: Never Smoker  . Smokeless tobacco: Never Used  Substance Use Topics  . Alcohol use: No    Alcohol/week: 0.0 oz  . Drug use: No    Family History  Problem Relation Age of Onset  . Heart attack Mother   . Hypertension Father     No Known Allergies  Medication list has been reviewed and updated.  Current Outpatient Medications on File Prior to Visit  Medication Sig Dispense Refill  . gabapentin (NEURONTIN) 300 MG capsule Take 1 capsule (300 mg total) 3 (three) times daily by mouth. 90 capsule 3  . hydrochlorothiazide (HYDRODIURIL) 25 MG tablet TAKE ONE TABLET BY MOUTH ONE TIME DAILY 90 tablet 3  . lisinopril (PRINIVIL,ZESTRIL) 40 MG tablet TAKE 1 TABLET (40 MG TOTAL) BY MOUTH DAILY. 90 tablet 1  . lovastatin (MEVACOR) 20 MG tablet Take 1 tablet (20 mg total) by mouth at bedtime. 90 tablet 3  . Vitamin D, Ergocalciferol, (DRISDOL) 50000 units CAPS capsule TAKE 1 CAPSULE BY MOUTH EVERY 7 (SEVEN) DAYS. 12 capsule 0   No current facility-administered medications on file prior to visit.     Review of Systems:  As per HPI- otherwise negative.   Physical Examination: Vitals:   09/11/17 0827  BP: 134/90  Pulse: 70  Temp: 98.3 F (36.8 C)  SpO2: 96%   Vitals:   09/11/17 0827  Weight: 196 lb (88.9 kg)   Body mass index is 30.7  kg/m. Ideal Body Weight:    GEN: WDWN, NAD, Non-toxic, A & O x 3, overweight, large build HEENT: Atraumatic, Normocephalic. Neck supple. No masses, No LAD.  Bilateral TM wnl, oropharynx normal.  PEERL,EOMI.   Ears and Nose: No external deformity. CV: RRR, No M/G/R. No JVD. No thrill. No extra heart sounds. PULM: CTA B, no wheezes, crackles, rhonchi. No retractions. No resp. distress. No accessory muscle use. ABD: S, NT, ND, +BS. No rebound. No HSM. EXTR: No c/c/e NEURO Normal gait.  PSYCH: Normally interactive. Conversant. Not depressed or anxious appearing.  Calm demeanor.  She is upset today as she is worried about cancer.  Discussed with her in detail  Assessment and Plan: Chest pain, unspecified type - Plan: Ambulatory referral to Cardiology  Mediastinal mass - Plan: NM PET Image Initial (PI) Skull Base To Thigh, Ambulatory referral to Cardiothoracic Surgery  Hypokalemia - Plan: Basic metabolic panel  Anticoagulation management encounter - Plan: INR/PT  Deep vein thrombosis (DVT) of left lower extremity, unspecified chronicity, unspecified vein (HCC) - Plan: rivaroxaban (XARELTO) 20 MG TABS tablet  Here today to follow up on chest pain Will refer to cardiology as she may need a stress test Noted to have mediastinal mass on CT scan in the ER- discussed with RN at CT surgery. She reviewed CT and recommended that I order a PET scan now, and we will set up a CT surgery evaluation as well   Called [employer] for Peer to Peer discussion, and got her PET approved, will forward approval number to [facility]  Signed Lamar Blinks, MD  Received her labs, called pt and discussed with her.  Let her know that she should be able to get xarelto now- just stop coumadin and start on xarelto the next day.  Her INR is well under 3 so she can make this change directly   Will also treat her hypokalemia with K repletion- 20 meq daily for 4 days.   Results for orders placed or  performed in visit on 96/04/54  Basic metabolic panel  Result Value Ref Range   Sodium 141 135 - 145 mEq/L   Potassium 3.2 (L) 3.5 - 5.1 mEq/L   Chloride 103 96 - 112 mEq/L   CO2 29 19 - 32 mEq/L   Glucose, Bld 107 (H) 70 - 99 mg/dL   BUN 21 6 - 23 mg/dL   Creatinine, Ser 0.98 0.40 - 1.20 mg/dL   Calcium 9.0 8.4 - 10.5 mg/dL   GFR 60.76 >60.00 mL/min  INR/PT  Result Value Ref Range   INR 1.1 (H) 0.8 - 1.0 ratio   Prothrombin Time 12.4 9.6 - 13.1 sec

## 2017-09-11 ENCOUNTER — Ambulatory Visit (INDEPENDENT_AMBULATORY_CARE_PROVIDER_SITE_OTHER): Payer: 59 | Admitting: Family Medicine

## 2017-09-11 ENCOUNTER — Encounter: Payer: Self-pay | Admitting: Family Medicine

## 2017-09-11 VITALS — BP 134/90 | HR 70 | Temp 98.3°F | Wt 196.0 lb

## 2017-09-11 DIAGNOSIS — Z5181 Encounter for therapeutic drug level monitoring: Secondary | ICD-10-CM | POA: Diagnosis not present

## 2017-09-11 DIAGNOSIS — E876 Hypokalemia: Secondary | ICD-10-CM | POA: Diagnosis not present

## 2017-09-11 DIAGNOSIS — I82402 Acute embolism and thrombosis of unspecified deep veins of left lower extremity: Secondary | ICD-10-CM | POA: Diagnosis not present

## 2017-09-11 DIAGNOSIS — J9859 Other diseases of mediastinum, not elsewhere classified: Secondary | ICD-10-CM | POA: Diagnosis not present

## 2017-09-11 DIAGNOSIS — R079 Chest pain, unspecified: Secondary | ICD-10-CM | POA: Diagnosis not present

## 2017-09-11 DIAGNOSIS — Z7901 Long term (current) use of anticoagulants: Secondary | ICD-10-CM

## 2017-09-11 LAB — BASIC METABOLIC PANEL
BUN: 21 mg/dL (ref 6–23)
CHLORIDE: 103 meq/L (ref 96–112)
CO2: 29 mEq/L (ref 19–32)
Calcium: 9 mg/dL (ref 8.4–10.5)
Creatinine, Ser: 0.98 mg/dL (ref 0.40–1.20)
GFR: 60.76 mL/min (ref >60.00)
Glucose, Bld: 107 mg/dL — ABNORMAL HIGH (ref 70–99)
POTASSIUM: 3.2 meq/L — AB (ref 3.5–5.1)
Sodium: 141 mEq/L (ref 135–145)

## 2017-09-11 LAB — PROTIME-INR
INR: 1.1 ratio — AB (ref 0.8–1.0)
PROTHROMBIN TIME: 12.4 s (ref 9.6–13.1)

## 2017-09-11 MED ORDER — POTASSIUM CHLORIDE CRYS ER 20 MEQ PO TBCR: EXTENDED_RELEASE_TABLET | ORAL | 3 refills | Status: DC

## 2017-09-11 MED ORDER — RIVAROXABAN 20 MG PO TABS: 20.0000 mg | ORAL_TABLET | Freq: Every day | ORAL | 11 refills | Status: DC

## 2017-09-11 NOTE — Patient Instructions (Addendum)
It was good to see you today!  I am going to arrange 3 things 1. A special type of scan (a PET scan) to look at the finding in your chest 2. Appointment with cardiothoracic (chest surgery) specialist to discuss doing a biopsy 3. Appointment with cardiology (heart doctor) to talk about a stress test for your heart   I am also trying to get Xarelto for you so we won't have to use Coumadin  We will get some blood for you today- I will be in touch with your results asap

## 2017-09-16 ENCOUNTER — Ambulatory Visit (INDEPENDENT_AMBULATORY_CARE_PROVIDER_SITE_OTHER): Payer: 59

## 2017-09-16 DIAGNOSIS — Z7901 Long term (current) use of anticoagulants: Secondary | ICD-10-CM | POA: Diagnosis not present

## 2017-09-16 LAB — POCT INR: INR: 1.7

## 2017-09-16 NOTE — Progress Notes (Signed)
Good compliance with Coumadin: Coumadin 7.5 daily 5 mg Monday Wednesday and Friday 45 mg/week  INR today is 1.7  I noticed that PCP switch her to Xarelto but apparently she was not able to get it and is on Coumadin. She said she has been taking Coumadin daily, previous INR was 1.1 few days ago thus INR is coming up. Plan: No change, INR in 1 week, will notify PCP JP.

## 2017-09-16 NOTE — Progress Notes (Signed)
Pt comes in today to INR. Pt discontinued Xerelto and began taking warfarin about 2 months ago. She states she takes one tablet (5mg ) on Wednesday and Friday  Takes 1.5 tablets (7.5mg ) on Monday, Tuesday, Thursday, Saturday and Sunday's. Pt states that during her last visit she was 1.7. Today her reading is 1.7. After consulting with Dr.Paz Pt is advised to come back in 1 week, no changes in dosages.

## 2017-09-23 ENCOUNTER — Encounter (HOSPITAL_COMMUNITY)
Admission: RE | Admit: 2017-09-23 | Discharge: 2017-09-23 | Disposition: A | Discharge status: Home / Self Care | Payer: 59 | Source: Ambulatory Visit | Attending: Family Medicine | Admitting: Family Medicine

## 2017-09-23 DIAGNOSIS — J9859 Other diseases of mediastinum, not elsewhere classified: Secondary | ICD-10-CM | POA: Insufficient documentation

## 2017-09-23 LAB — GLUCOSE, CAPILLARY: Glucose-Capillary: 94 mg/dL (ref 65–99)

## 2017-09-23 MED ORDER — FLUDEOXYGLUCOSE F - 18 (FDG) INJECTION
10.2000 | Freq: Once | INTRAVENOUS | Status: AC | PRN
  Administered 2017-09-23: 10.2 via INTRAVENOUS

## 2017-09-24 ENCOUNTER — Encounter: Payer: 59 | Admitting: Thoracic Surgery (Cardiothoracic Vascular Surgery)

## 2017-09-25 ENCOUNTER — Institutional Professional Consult (permissible substitution) (INDEPENDENT_AMBULATORY_CARE_PROVIDER_SITE_OTHER): Payer: 59 | Admitting: Thoracic Surgery (Cardiothoracic Vascular Surgery)

## 2017-09-25 ENCOUNTER — Ambulatory Visit: Payer: 59

## 2017-09-25 VITALS — BP 188/92 | HR 60 | Resp 20 | Ht 67.0 in | Wt 199.0 lb

## 2017-09-25 DIAGNOSIS — J9859 Other diseases of mediastinum, not elsewhere classified: Secondary | ICD-10-CM | POA: Diagnosis not present

## 2017-09-25 NOTE — Progress Notes (Signed)
PCP is Copland, Gay Filler, MD Referring Provider is Copland, Gay Filler, MD  Chief Complaint  Patient presents with  . Mediastinal Mass    Surgical eval, PET Scan 09/23/2017, Chest CT 09/04/17,     HPI: 62 year old woman sent for consultation regarding an anterior mediastinal mass.  Mrs. Kawa is a 62 year old woman originally from [place].  Her past medical history is significant for colon cancer (2011), DVT x2, hypertension, hyperlipidemia and a uterine fibroid.  She presented to Dr. Lorelei Pont on 09/04/2017 for an anticoagulation evaluation.  She reported that she been having left-sided chest pain that rarely radiated to her neck.  She would have this pain up to 5 times a day but did not have it every day.  It would last between a few seconds and 20 minutes.  It was not associated with exertion.  She was sent to the ED for evaluation.  She did not have any shortness of breath, nausea, vomiting, or diaphoresis.  She did not have any acute ECG changes and her troponin was negative.  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   She has been on warfarin for her DVT, but recently was switched to Xarelto.  She had a PET/CT done and now presents for consultation.  Zubrod Score: At the time of surgery this patient's most appropriate activity status/level should be described as: [x]     0    Normal activity, no symptoms []     1    Restricted in physical strenuous activity but ambulatory, able to do out light work []     2    Ambulatory and capable of self care, unable to do work activities, up and about >50 % of waking hours                              []     3    Only limited self care, in bed greater than 50% of waking hours []     4    Completely disabled, no self care, confined to bed or chair []     5    Moribund   Past Medical History:   Diagnosis Date  . Cancer ([place]) 08/03/2010   colon  . DVT (deep venous thrombosis) ([place])   . Hypertension   . Leg DVT (deep venous thromboembolism), acute ([place])   . Uterine fibroid     Past Surgical History:  Procedure Laterality Date  . ABDOMINAL HYSTERECTOMY    . CHOLECYSTECTOMY    . COLON SURGERY    . GALLBLADDER SURGERY      Family History  Problem Relation Age of Onset  . Heart attack Mother   . Hypertension Father     Social History Social History   Tobacco Use  . Smoking status: Never Smoker  . Smokeless tobacco: Never Used  Substance Use Topics  . Alcohol use: No    Alcohol/week: 0.0 oz  . Drug use: No    Current Outpatient Medications  Medication Sig Dispense Refill  . gabapentin (NEURONTIN) 300 MG capsule Take 1 capsule (300 mg total) 3 (three) times daily by mouth. 90 capsule 3  . hydrochlorothiazide (HYDRODIURIL) 25 MG tablet TAKE ONE TABLET BY MOUTH ONE TIME DAILY 90 tablet 3  .  lisinopril (PRINIVIL,ZESTRIL) 40 MG tablet TAKE 1 TABLET (40 MG TOTAL) BY MOUTH DAILY. 90 tablet 1  . lovastatin (MEVACOR) 20 MG tablet Take 1 tablet (20 mg total) by mouth at bedtime. 90 tablet 3  . potassium chloride SA (K-DUR,KLOR-CON) 20 MEQ tablet Take 1 daily for 4 DAYS ONLY.  THEN STOP and save in case needed later 30 tablet 3  . Vitamin D, Ergocalciferol, (DRISDOL) 50000 units CAPS capsule TAKE 1 CAPSULE BY MOUTH EVERY 7 (SEVEN) DAYS. 12 capsule 0  . warfarin (COUMADIN) 5 MG tablet      No current facility-administered medications for this visit.     No Known Allergies  Review of Systems  Constitutional: Negative for activity change and unexpected weight change.  HENT: Negative for trouble swallowing and voice change.   Eyes: Negative for visual disturbance.  Respiratory: Negative for cough, shortness of breath and wheezing.   Cardiovascular: Positive for chest pain (Left sided) and leg swelling (With DVT in November now resolved). Negative for palpitations.   Gastrointestinal: Negative for abdominal distention and abdominal pain.  Genitourinary: Negative for difficulty urinating and dysuria.  Musculoskeletal: Negative for arthralgias and myalgias.       The pain along the medial aspect of left leg with ambulation  Neurological: Negative for syncope, weakness and headaches.  Hematological: Negative for adenopathy. Bruises/bleeds easily (On anticoagulation).    BP (!) 188/92   Pulse 60   Resp 20   Ht 5\' 7"  (1.702 m)   Wt 199 lb (90.3 kg)   SpO2 99%   BMI 31.17 kg/m  Physical Exam  Constitutional: She is oriented to person, place, and time. She appears well-developed and well-nourished. No distress.  HENT:  Head: Normocephalic and atraumatic.  Mouth/Throat: No oropharyngeal exudate.  Eyes: Conjunctivae and EOM are normal. No scleral icterus.  Neck: Neck supple. No thyromegaly present.  Cardiovascular: Normal rate, regular rhythm and normal heart sounds. Exam reveals no gallop and no friction rub.  No murmur heard. Pulmonary/Chest: Effort normal and breath sounds normal. No respiratory distress. She has no wheezes. She has no rales.  Abdominal: Soft. She exhibits no distension. There is no tenderness.  Musculoskeletal: She exhibits no edema or deformity.  Lymphadenopathy:    She has no cervical adenopathy.  Neurological: She is alert and oriented to person, place, and time. No cranial nerve deficit. She exhibits normal muscle tone. Coordination normal.  Skin: Skin is warm and dry.  Vitals reviewed.    Diagnostic Tests: CT CHEST WITH CONTRAST  TECHNIQUE: Multidetector CT imaging of the chest was performed during intravenous contrast administration.  CONTRAST:  15mL ISOVUE-300 IOPAMIDOL (ISOVUE-300) INJECTION 61% IV  COMPARISON:  03/10/2013; correlation chest radiograph 09/04/2017  FINDINGS: Cardiovascular: Mild atherosclerotic calcification aorta without aneurysmal dilatation. Pulmonary arteries grossly patent on  non targeted exam. No pericardial effusion.  Mediastinum/Nodes: Small calcified nodule within RIGHT thyroid lobe unchanged. Remaining visualized base of cervical region unremarkable. Anterior mediastinal mass 3.4 x 2.6 x 2.6 cm question enlarged lymph node versus mass. This measured 2.2 x 0.9 x 1.5 cm on 03/10/2013 and appear to represent a lymph node. No additional thoracic adenopathy. Few additional scattered normal size mediastinal lymph nodes identified.  Lungs/Pleura: Bibasilar atelectasis. Lungs otherwise clear. No pleural effusion or pneumothorax.  Upper Abdomen: Gallbladder surgically absent. Visualized upper abdomen otherwise unremarkable.  Musculoskeletal: No acute osseous findings.  IMPRESSION: Anterior mediastinal soft tissue mass 3.4 x 2.6 x 2.6 cm question enlarged lymph node versus other soft tissue mass, increased since 2014  when it appeared to represent a 9 mm short axis lymph node.  This could represent a metastatic lymph node in a patient with a history of colon cancer, potentially lymphoma, other nonspecific soft tissue tumor, less likely to be of thymic or thyroid origin; further evaluation by PET-CT recommended.  Aortic Atherosclerosis (ICD10-I70.0).   Electronically Signed   By: Lavonia Dana M.D.   On: 09/04/2017 17:35 NUCLEAR MEDICINE PET SKULL BASE TO THIGH  TECHNIQUE: 10.2 mCi F-18 FDG was injected intravenously. Full-ring PET imaging was performed from the skull base to thigh after the radiotracer. CT data was obtained and used for attenuation correction and anatomic localization.  FASTING BLOOD GLUCOSE:  Value: 94 mg/dl  COMPARISON:  None.  FINDINGS: NECK  No hypermetabolic lymph nodes in the neck.  CHEST  Smoothly marginated anterior mediastinal mass measuring 3.1 by 2.6 mm cm has associated metabolic activity with SUV max equal 4.9. This lesion is enlarged from CT 03/10/2013 (9 by 21 mm) and similar to recent CT  09/04/2017.  No suspicious pulmonary nodules.  ABDOMEN/PELVIS  No abnormal hypermetabolic activity within the liver, pancreas, adrenal glands, or spleen. No hypermetabolic lymph nodes in the abdomen or pelvis.  There is a anastomosis in the mid rectum without associated metabolic activity or obstruction no hypermetabolic pelvic lymph nodes or retroperitoneal lymph nodes.  SKELETON  No focal hypermetabolic activity to suggest skeletal metastasis.  IMPRESSION: 1. Slowly enlarging anterior mediastinal mass with smooth borders and moderate metabolic activity. 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 Recommend tissue sampling. 2. No additional evidence of colorectal cancer local recurrence or metastasis.  These results will be called to the ordering clinician or representative by the Radiologist Assistant, and communication documented in the PACS or zVision Dashboard.   Electronically Signed   By: Suzy Bouchard M.D.   On: 09/23/2017 17:28 I personally reviewed the CT and PET/CT images and concur with the findings noted above.  Review of old CTs dating back to 2012 shows this mass was present but much smaller.  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  Impression: 62 year old woman with a remote history of colon cancer, as well as a history of hypertension, hyperlipidemia, uterine fibroid, and DVT.  She recently presented with left-sided chest pain.  The pain was atypical for angina but she was sent to the emergency room for evaluation.  Her EKG was unchanged and troponin was normal.  A CT of the chest ruled out pulmonary embolus, but she was found to have a 3.4 x 2.5 x 2.6 cm anterior mediastinal mass.  This mass is hypermetabolic by PET CT with an SUV of 4.7.  The differential diagnosis includes thymoma, ectopic thyroid, lymphoma, germ cell tumors, and metastatic lymphadenopathy.  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  Based on its slow growth and well-circumscribed appearance on CT I suspect this is a benign tumor.  However, does have activity on PET and has grown, therefore I do think it needs to be removed.  I do not see any utility for needle biopsy and radiology as you do a needle biopsy in this area.  It can be difficult, if not impossible, to differentiate between thymoma and lymphoma based on a needle biopsy.  I recommended that we proceed with a partial sternotomy for thymectomy.  I described the general nature of the procedure to her including the need for general anesthesia, the incision to be used, use of drains to  postoperatively, the expected hospital stay, and the overall recovery. I informed her and her son of the indications, risks, benefits and alternatives. They understand the risks include but are not limited to death, MI, DVT, PE, bleeding, possible need for transfusion, infection, recurrent or phrenic nerve injury.   She would need to be off Xarelto for 2 days prior to procedure.  Dr. Lorelei Pont mentioned in her note she was planning to send to Cardiology for evaluation of her chest pain. We will await that evaluation prior to scheduling surgery.  Plan: Cardiology evaluation Partial sternotomy, thymectomy Stop Xarelto for 2 days prior to surgery  [name], MD [facility] [phone]

## 2017-09-26 ENCOUNTER — Ambulatory Visit (INDEPENDENT_AMBULATORY_CARE_PROVIDER_SITE_OTHER): Payer: 59

## 2017-09-26 DIAGNOSIS — Z7901 Long term (current) use of anticoagulants: Secondary | ICD-10-CM | POA: Diagnosis not present

## 2017-09-26 LAB — POCT INR: INR: 1

## 2017-09-26 NOTE — Progress Notes (Signed)
  Pt last reading was 1.7  Today her reading is  1.0  Pt is no longer taking warfarin and is instead taking Xerelto daily per PCP's orders due to complication's managing Pt's doses. I called and spoke with Dr.Copland and she advised me to make sure the Pt was no longer taking the warfarin and instead taking xerelto as advised by PCP. The Pt states that she is no longer taking warfarin and is taking the xerelto as advised. I informed Pt that she would no longer need to come in and check INR per Dr.Copland and she should follow up as needed at this time.

## 2017-10-06 ENCOUNTER — Other Ambulatory Visit: Payer: Self-pay | Admitting: Family Medicine

## 2017-10-06 ENCOUNTER — Ambulatory Visit (INDEPENDENT_AMBULATORY_CARE_PROVIDER_SITE_OTHER): Payer: 59 | Admitting: Cardiology

## 2017-10-06 ENCOUNTER — Encounter: Payer: Self-pay | Admitting: Cardiology

## 2017-10-06 VITALS — BP 150/88 | HR 72 | Ht 67.0 in | Wt 199.0 lb

## 2017-10-06 DIAGNOSIS — I10 Essential (primary) hypertension: Secondary | ICD-10-CM

## 2017-10-06 DIAGNOSIS — J9859 Other diseases of mediastinum, not elsewhere classified: Secondary | ICD-10-CM

## 2017-10-06 DIAGNOSIS — E7800 Pure hypercholesterolemia, unspecified: Secondary | ICD-10-CM

## 2017-10-06 DIAGNOSIS — R079 Chest pain, unspecified: Secondary | ICD-10-CM | POA: Diagnosis not present

## 2017-10-06 DIAGNOSIS — Z01810 Encounter for preprocedural cardiovascular examination: Secondary | ICD-10-CM | POA: Diagnosis not present

## 2017-10-06 MED ORDER — SPIRONOLACTONE 25 MG PO TABS: 25.0000 mg | ORAL_TABLET | Freq: Every day | ORAL | 3 refills | Status: DC

## 2017-10-06 MED ORDER — METOPROLOL TARTRATE 50 MG PO TABS: ORAL_TABLET | ORAL | 0 refills | Status: DC

## 2017-10-06 NOTE — Progress Notes (Signed)
Cardiology Office Note:    Date:  02/21/4579   ID:  [name], DOB [birth year], MRN [id number]  PCP:  Darreld Mclean, MD  Cardiologist:  No primary care provider on file.   Referring MD: [name], *   Chief Complaint  Patient presents with  . Pre-op Exam  Chest pain  History of Present Illness:    [name] is a 62 y.o. 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  She presented to Dr. Lorelei Pont on 09/04/2017 for an anticoagulation evaluation.  She reported that she been having left-sided chest pain that rarely radiated to her neck.  She would have this pain up to 5 times a day but did not have it every day.  It would last between a few seconds and 20 minutes.  It was not associated with exertion.  She was sent to the ED for evaluation.  She did not have any shortness of breath, nausea, vomiting, or diaphoresis.  She did not have any acute ECG changes and her troponin was negative.  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  This mass is hypermetabolic by PET CT with an SUV of 4.7. The differential diagnosis includes thymoma, ectopic thyroid, lymphoma, germ cell tumors, and metastatic lymphadenopathy.  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  Based on its slow growth and well-circumscribed appearance on CT I suspect this is a benign tumor.  However, does have activity on PET and has grown, therefore it needs to be removed. He is planning to proceed with of partial sternotomy for thymectomy.    The patient states that chest pain is due to her, she is  fairly active in her chest pain is not related to exertion, however is retrosternal feels pressure-like and can sometimes be radiating to her back or her arm. She can get it several times a day, she has noticed significant dyspnea on exertion, she states that in her family people don't have premature coronary artery disease or stabbing cardiac death. Her father was 46 he still alive. She has known hyperlipidemia with significantly elevated LDL of 233 she is currently being treated with lovastatin.   Past Medical History:  Diagnosis Date  . Cancer ([place]) 08/03/2010   colon  . DVT (deep venous thrombosis) ([place])   . Hypertension   . Leg DVT (deep venous thromboembolism), acute ([place])   . Uterine fibroid     Past Surgical History:  Procedure Laterality Date  . ABDOMINAL HYSTERECTOMY    . CHOLECYSTECTOMY    . COLON SURGERY    . GALLBLADDER SURGERY      Current Medications: Current Meds  Medication Sig  . gabapentin (NEURONTIN) 300 MG capsule Take 1 capsule (300 mg total) 3 (three) times daily by mouth.  . hydrochlorothiazide (HYDRODIURIL) 25 MG tablet TAKE ONE TABLET BY MOUTH ONE TIME DAILY  . lisinopril (PRINIVIL,ZESTRIL) 40 MG tablet TAKE 1 TABLET (40 MG TOTAL) BY MOUTH DAILY.  Marland Kitchen lovastatin (MEVACOR) 20 MG tablet Take 1 tablet (20 mg total) by mouth at bedtime.  . potassium chloride SA (  K-DUR,KLOR-CON) 20 MEQ tablet Take 1 daily for 4 DAYS ONLY.  THEN STOP and save in case needed later  . rivaroxaban (XARELTO) 20 MG TABS tablet Take 20 mg by mouth daily with supper.     Allergies:   Patient has no known allergies.   Social History   Socioeconomic History  . Marital status: Married    Spouse name: None  . Number of children: 1  . Years of education: Bachelors  . Highest education level: None  Social Needs  . Financial resource strain: None  . Food insecurity - worry: None  . Food insecurity - inability: None  . Transportation needs - medical: None  . Transportation needs -  non-medical: None  Occupational History  . None  Tobacco Use  . Smoking status: Never Smoker  . Smokeless tobacco: Never Used  Substance and Sexual Activity  . Alcohol use: No    Alcohol/week: 0.0 oz  . Drug use: No  . Sexual activity: None  Other Topics Concern  . None  Social History Narrative   Lives at home with husband and son.   Right-handed.   No caffeine use.     Family History: The patient's family history includes Heart attack in her mother; Hypertension in her father.  ROS:   Please see the history of present illness.    All other systems reviewed and are negative.  EKGs/Labs/Other Studies Reviewed:    The following studies were reviewed today:  EKG:  EKG is not ordered today.  The ekg from 09/05/2017 shows normal sinus rhythm and normal EKG.  Recent Labs: 12/18/2016: TSH 2.42 09/04/2017: ALT 29; Hemoglobin 13.2; Platelets 174 09/11/2017: BUN 21; Creatinine, Ser 0.98; Potassium 3.2; Sodium 141  Recent Lipid Panel    Component Value Date/Time   CHOL 316 (H) 12/18/2016 1016   TRIG 142.0 12/18/2016 1016   HDL 58.80 12/18/2016 1016   CHOLHDL 5 12/18/2016 1016   VLDL 28.4 12/18/2016 1016   LDLCALC 229 (H) 12/18/2016 1016    Physical Exam:    VS:  BP (!) 150/88 (BP Location: Right Arm, Patient Position: Sitting, Cuff Size: Normal)   Pulse 72   Ht 5\' 7"  (1.702 m)   Wt 199 lb (90.3 kg)   SpO2 98%   BMI 31.17 kg/m     Wt Readings from Last 3 Encounters:  10/06/17 199 lb (90.3 kg)  09/25/17 199 lb (90.3 kg)  09/11/17 196 lb (88.9 kg)     GEN: Well nourished, well developed in no acute distress HEENT: Normal NECK: No JVD; No carotid bruits LYMPHATICS: No lymphadenopathy CARDIAC: RRR, no murmurs, rubs, gallops RESPIRATORY:  Clear to auscultation without rales, wheezing or rhonchi  ABDOMEN: Soft, non-tender, non-distended MUSCULOSKELETAL:  Mild B/L LE edema; No deformity  SKIN: Warm and dry NEUROLOGIC:  Alert and oriented x 3 PSYCHIATRIC:  Normal  affect   ASSESSMENT:    1. Chest pain, unspecified type   2. Mediastinal mass   3. Preoperative cardiovascular examination   4. Essential hypertension   5. Pure hypercholesterolemia    PLAN:    In order of problems listed above:  1. The patient has rather atypical chest pain however she is significantly elevated LDL, the other risk factor include her age and hypertension, we will obtain on coronary CTA to evaluate for possible obstructive coronary artery disease as part of preop evaluation for her mediastinal mass removal as well as risk stratification and further management of her hyperlipidemia. 2. Hypertension - per patient  has been elevated a lot recently, she does have lower extremity edema, and hypokalemia, I will add sternal activity and 25 mg daily and follow-up in a few weeks to check on her blood pressure. 3. Lower extremity edema - as above, partially secondary to DVT. 4. Hyperlipidemia - as above 5. Preoperative evaluation - considering she has active chest pain that can possibly be attribute to her mediastinal mass, however given her risk factors he had to exclude obstructive coronary artery disease, if that's excluded we will refer to CT surgery right after.  Medication Adjustments/Labs and Tests Ordered: Current medicines are reviewed at length with the patient today.  Concerns regarding medicines are outlined above.  Orders Placed This Encounter  Procedures  . CT CORONARY MORPH W/CTA COR W/SCORE W/CA W/CM &/OR WO/CM  . CT CORONARY FRACTIONAL FLOW RESERVE DATA PREP  . CT CORONARY FRACTIONAL FLOW RESERVE FLUID ANALYSIS   Meds ordered this encounter  Medications  . spironolactone (ALDACTONE) 25 MG tablet    Sig: Take 1 tablet (25 mg total) by mouth daily.    Dispense:  90 tablet    Refill:  3  . metoprolol tartrate (LOPRESSOR) 50 MG tablet    Sig: Take one tablet by mouth one hour prior to your CT    Dispense:  1 tablet    Refill:  0    Signed, Ena Dawley,  MD  10/06/2017 5:22 PM    [facility]

## 2017-10-06 NOTE — Patient Instructions (Addendum)
Medication Instructions:  1) START Spironolactone 25mg  once daily  Labwork: None  Testing/Procedures: Your physician recommends that you have a Coronary CT.  Follow-Up: Your physician recommends that you schedule a follow-up appointment in: 3 weeks with Dr. Meda Coffee or a PA or NP.   Any Other Special Instructions Will Be Listed Below (If Applicable).  [facility] [address] [place], [place] [address] [phone]  Proceed to the [facility] Radiology Department (First Floor).  Please follow these instructions carefully (unless otherwise directed):  On the Night Before the Test: . Drink plenty of water. . Do not consume any caffeinated/decaffeinated beverages or chocolate 12 hours prior to your test. . Do not take any antihistamines 12 hours prior to your test. . If you take Metformin do not take 24 hours prior to test. . If the patient has contrast allergy: ? Patient will need a prescription for Prednisone and very clear instructions (as follows): 1. Prednisone 50 mg - take 13 hours prior to test 2. Take another Prednisone 50 mg 7 hours prior to test 3. Take another Prednisone 50 mg 1 hour prior to test 4. Take Benadryl 50 mg 1 hour prior to test . Patient must complete all four doses of above prophylactic medications. . Patient will need a ride after test due to Benadryl.  On the Day of the Test: . Drink plenty of water. Do not drink any water within one hour of the test. . Do not eat any food 4 hours prior to the test. . You may take your regular medications prior to the test. . IF NOT ON A BETA BLOCKER - Take 50 mg of lopressor (metoprolol) one hour before the test. . HOLD Furosemide morning of the test.  After the Test: . Drink plenty of water. . After receiving IV contrast, you may experience a mild flushed feeling. This is normal. . On occasion, you may experience a mild rash up to 24 hours after the test. This is not dangerous. If this occurs, you  can take Benadryl 25 mg and increase your fluid intake. . If you experience trouble breathing, this can be serious. If it is severe call 911 IMMEDIATELY. If it is mild, please call our office. . If you take any of these medications: Glipizide/Metformin, Avandament, Glucavance, please do not take 48 hours after completing test.    If you need a refill on your cardiac medications before your next appointment, please call your pharmacy.

## 2017-10-27 ENCOUNTER — Ambulatory Visit (INDEPENDENT_AMBULATORY_CARE_PROVIDER_SITE_OTHER): Payer: 59 | Admitting: Physician Assistant

## 2017-10-27 ENCOUNTER — Encounter: Payer: Self-pay | Admitting: Physician Assistant

## 2017-10-27 VITALS — BP 144/94 | HR 53 | Ht 67.0 in | Wt 196.0 lb

## 2017-10-27 DIAGNOSIS — Z86718 Personal history of other venous thrombosis and embolism: Secondary | ICD-10-CM | POA: Diagnosis not present

## 2017-10-27 DIAGNOSIS — J9859 Other diseases of mediastinum, not elsewhere classified: Secondary | ICD-10-CM | POA: Diagnosis not present

## 2017-10-27 DIAGNOSIS — R079 Chest pain, unspecified: Secondary | ICD-10-CM

## 2017-10-27 DIAGNOSIS — I10 Essential (primary) hypertension: Secondary | ICD-10-CM

## 2017-10-27 MED ORDER — AMLODIPINE BESYLATE 5 MG PO TABS: 5.0000 mg | ORAL_TABLET | Freq: Every day | ORAL | 11 refills | Status: DC

## 2017-10-27 MED ORDER — METOPROLOL TARTRATE 50 MG PO TABS: 50.0000 mg | ORAL_TABLET | Freq: Once | ORAL | 0 refills | Status: DC

## 2017-10-27 NOTE — Progress Notes (Signed)
Cardiology Office Note:    Date:  9/67/8938   ID:  [name], DOB [birth year], MRN [id number]  PCP:  Darreld Mclean, MD  Cardiologist:  Ena Dawley, MD   Referring MD: Darreld Mclean, MD   Chief Complaint  Patient presents with  . Follow-up    blood pressure    History of Present Illness:    Dimitria Buenaventura is a 62 y.o. 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  She was evaluated for chest pain in January 2019 and a CT was neg for pulmonary embolism but did show an anterior mediastinal mass.  This was evaluated by PET CT and it is hypermetabolic.  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  She was evaluated by Dr. Ena Dawley 10/06/17 for surgical clearance.  Coronary CTA was arranged and is currently pending.  She was placed on spironolactone for uncontrolled blood pressure.      Ms. Dowlen returns for follow up on blood pressure.  She is here alone. She was under the impression she was getting her CT today. She took Metoprolol and went to the hospital earlier today.  She denies any change in her symptoms since last seen. She denies significant shortness of breath.  She has occasional chest pain without change.  She denies syncope, paroxysmal nocturnal dyspnea, edema.    Prior CV studies:   The following studies were reviewed today:  None   Past Medical History:  Diagnosis Date  . Cancer ([place]) 08/03/2010   colon  . 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 HLD (hyperlipidemia)   . Hypertension   . Uterine fibroid    Surgical Hx: The patient  has a past surgical history that includes Gallbladder surgery; Colon surgery; Cholecystectomy; and Abdominal hysterectomy.   Current Medications: Current Meds  Medication Sig  . gabapentin (NEURONTIN) 300 MG capsule Take 1 capsule (300 mg total) 3 (three) times daily by mouth.   . hydrochlorothiazide (HYDRODIURIL) 25 MG tablet TAKE ONE TABLET BY MOUTH ONE TIME DAILY  . lisinopril (PRINIVIL,ZESTRIL) 40 MG tablet TAKE 1 TABLET (40 MG TOTAL) BY MOUTH DAILY.  Marland Kitchen lovastatin (MEVACOR) 20 MG tablet Take 1 tablet (20 mg total) by mouth at bedtime.  . rivaroxaban (XARELTO) 20 MG TABS tablet Take 20 mg by mouth daily with supper.  Marland Kitchen spironolactone (ALDACTONE) 25 MG tablet Take 1 tablet (25 mg total) by mouth daily.     Allergies:   Patient has no known allergies.   Social History   Tobacco Use  . Smoking status: Never Smoker  . Smokeless tobacco: Never Used  Substance Use Topics  . Alcohol use: No    Alcohol/week: 0.0 oz  . Drug use: No     Family Hx: The patient's family history includes Heart attack in her mother; Hypertension in her father.  ROS:   Please see the history of present illness.    Review of Systems  Cardiovascular: Positive for chest pain and leg swelling.  Musculoskeletal: Positive for joint pain.   All other systems reviewed and are negative.   EKGs/Labs/Other Test Reviewed:    EKG:  EKG is not ordered today.    Recent Labs: 12/18/2016: TSH 2.42 09/04/2017: ALT 29; Hemoglobin 13.2; Platelets 174 09/11/2017: BUN 21; Creatinine, Ser 0.98; Potassium 3.2; Sodium 141   Recent Lipid Panel Lab Results  Component Value Date/Time   CHOL 316 (H) 12/18/2016  10:16 AM   TRIG 142.0 12/18/2016 10:16 AM   HDL 58.80 12/18/2016 10:16 AM   CHOLHDL 5 12/18/2016 10:16 AM   LDLCALC 229 (H) 12/18/2016 10:16 AM    Physical Exam:    VS:  BP (!) 144/94   Pulse (!) 53   Ht 5\' 7"  (1.702 m)   Wt 196 lb (88.9 kg)   SpO2 97%   BMI 30.70 kg/m     Wt Readings from Last 3 Encounters:  10/27/17 196 lb (88.9 kg)  10/06/17 199 lb (90.3 kg)  09/25/17 199 lb (90.3 kg)     Physical Exam  Constitutional: She is oriented to person, place, and time. She appears well-developed and well-nourished. No distress.  HENT:  Head: Normocephalic and atraumatic.  Neck:  No JVD present.  Cardiovascular: Normal rate and regular rhythm.  No murmur heard. Pulmonary/Chest: Effort normal. She has no rales.  Abdominal: Soft.  Musculoskeletal: She exhibits no edema.  Neurological: She is alert and oriented to person, place, and time.  Skin: Skin is warm and dry.    ASSESSMENT & PLAN:    #1.  Essential hypertension Blood pressure remains above target.  She is on max dose ACE inhibitor.  At this point, I would not suggest increasing her hydrochlorothiazide spironolactone.  Baseline, she has heart rates in the upper 50s.  Therefore, I do not think she would tolerate a beta-blocker.  -Add amlodipine 5 mg daily.  -Follow-up in the hypertension clinic in 2-3 weeks  #2.  Chest pain, unspecified type Chest pain pattern is unchanged.  Coronary CTA is currently pending.  #3.  Mediastinal mass Pending resection with Dr. Roxan Hockey.  #4.  History of DVT (deep vein thrombosis) She remains on long-term rivaroxaban.  Dispo:  Return in about 3 months (around 01/27/2018) for Routine Follow Up w/ Dr. Meda Coffee.   Medication Adjustments/Labs and Tests Ordered: Current medicines are reviewed at length with the patient today.  Concerns regarding medicines are outlined above.  Tests Ordered: Orders Placed This Encounter  Procedures  . Basic metabolic panel   Medication Changes: Meds ordered this encounter  Medications  . amLODipine (NORVASC) 5 MG tablet    Sig: Take 1 tablet (5 mg total) by mouth daily.    Dispense:  30 tablet    Refill:  11    Order Specific Question:   Supervising Provider    Answer:   [place], [place]  . metoprolol tartrate (LOPRESSOR) 50 MG tablet    Sig: Take 1 tablet (50 mg total) by mouth once for 1 dose.    Dispense:  1 tablet    Refill:  0    This is a 1 time dose to take 1 hour before her CT    Danton Sewer, PA-C  10/27/2017 4:46 PM    [facility] [place], [place], [place]  [address] Phone:  [phone]; Fax: [phone]

## 2017-10-27 NOTE — Patient Instructions (Addendum)
Medication Instructions:  1. Start taking Amlodipine (Norvasc) 5 mg Once daily for blood pressure   2. Metoprolol Tartrate 50 mg tablet has been sent in; You will take this 1 hour before your CT.   Labwork: Today - BMET   Testing/Procedures: Your CT will be scheduled soon   Follow-Up: 1. Hypertension Clinic in 2-3 weeks  2. Dr. Ena Dawley in 3 months  Any Other Special Instructions Will Be Listed Below (If Applicable).  If you need a refill on your cardiac medications before your next appointment, please call your pharmacy.

## 2017-10-28 LAB — BASIC METABOLIC PANEL
BUN / CREAT RATIO: 10 — AB (ref 12–28)
BUN: 11 mg/dL (ref 8–27)
CHLORIDE: 103 mmol/L (ref 96–106)
CO2: 26 mmol/L (ref 20–29)
Calcium: 9.5 mg/dL (ref 8.7–10.3)
Creatinine, Ser: 1.08 mg/dL — ABNORMAL HIGH (ref 0.57–1.00)
GFR calc non Af Amer: 55 mL/min/{1.73_m2} — ABNORMAL LOW (ref >59)
GFR, EST AFRICAN AMERICAN: 63 mL/min/{1.73_m2} (ref >59)
Glucose: 107 mg/dL — ABNORMAL HIGH (ref 65–99)
POTASSIUM: 4.3 mmol/L (ref 3.5–5.2)
Sodium: 144 mmol/L (ref 134–144)

## 2017-10-29 ENCOUNTER — Telehealth: Payer: Self-pay | Admitting: *Deleted

## 2017-10-29 DIAGNOSIS — I10 Essential (primary) hypertension: Secondary | ICD-10-CM

## 2017-10-29 NOTE — Telephone Encounter (Signed)
-----   Message from Liliane Shi, PA-C sent at 10/28/2017  4:29 PM EDT ----- Potassium normal.  Creatinine somewhat increased but fairly stable. PLAN: Repeat BMET 2 weeks. [name], PA-C    10/28/2017 4:28 PM

## 2017-10-29 NOTE — Addendum Note (Signed)
Addended by: Michae Kava on: 10/29/2017 05:17 PM   Modules accepted: Orders

## 2017-10-29 NOTE — Telephone Encounter (Signed)
Called the pt to go over results. Man answered the phone and said no thank you and hung the phone. I called back and lmtcb to go over results and recommendations.

## 2017-11-11 ENCOUNTER — Ambulatory Visit (HOSPITAL_COMMUNITY)
Admission: RE | Admit: 2017-11-11 | Discharge: 2017-11-11 | Disposition: A | Discharge status: Home / Self Care | Payer: 59 | Source: Ambulatory Visit | Attending: Cardiology | Admitting: Cardiology

## 2017-11-11 ENCOUNTER — Ambulatory Visit (HOSPITAL_COMMUNITY): Payer: 59

## 2017-11-11 ENCOUNTER — Other Ambulatory Visit: Payer: 59

## 2017-11-11 DIAGNOSIS — I10 Essential (primary) hypertension: Secondary | ICD-10-CM

## 2017-11-11 DIAGNOSIS — R079 Chest pain, unspecified: Secondary | ICD-10-CM | POA: Diagnosis not present

## 2017-11-11 DIAGNOSIS — R0789 Other chest pain: Secondary | ICD-10-CM | POA: Diagnosis not present

## 2017-11-11 DIAGNOSIS — R911 Solitary pulmonary nodule: Secondary | ICD-10-CM | POA: Insufficient documentation

## 2017-11-11 MED ORDER — IOPAMIDOL (ISOVUE-370) INJECTION 76%
INTRAVENOUS | Status: AC
  Administered 2017-11-11: 80 mL via INTRAVENOUS
  Filled 2017-11-11: qty 100

## 2017-11-11 MED ORDER — NITROGLYCERIN 0.4 MG SL SUBL
0.8000 mg | SUBLINGUAL_TABLET | SUBLINGUAL | Status: DC | PRN
  Administered 2017-11-11: 0.8 mg via SUBLINGUAL

## 2017-11-11 MED ORDER — NITROGLYCERIN 0.4 MG SL SUBL
SUBLINGUAL_TABLET | SUBLINGUAL | Status: AC
  Filled 2017-11-11: qty 2

## 2017-11-12 ENCOUNTER — Ambulatory Visit: Payer: 59 | Admitting: Thoracic Surgery (Cardiothoracic Vascular Surgery)

## 2017-11-12 ENCOUNTER — Telehealth: Payer: Self-pay | Admitting: *Deleted

## 2017-11-12 LAB — BASIC METABOLIC PANEL
BUN/Creatinine Ratio: 14 (ref 12–28)
BUN: 11 mg/dL (ref 8–27)
CALCIUM: 9.1 mg/dL (ref 8.7–10.3)
CHLORIDE: 104 mmol/L (ref 96–106)
CO2: 24 mmol/L (ref 20–29)
CREATININE: 0.76 mg/dL (ref 0.57–1.00)
GFR calc Af Amer: 96 mL/min/{1.73_m2} (ref >59)
GFR calc non Af Amer: 83 mL/min/{1.73_m2} (ref >59)
Glucose: 88 mg/dL (ref 65–99)
Potassium: 4 mmol/L (ref 3.5–5.2)
Sodium: 145 mmol/L — ABNORMAL HIGH (ref 134–144)

## 2017-11-12 NOTE — Telephone Encounter (Signed)
Left message to go over lab results.  

## 2017-11-12 NOTE — Telephone Encounter (Signed)
-----   Message from Liliane Shi, [place] sent at 11/12/2017 12:53 PM EDT ----- Renal function normal.  The potassium is normal. Continue current medications and follow up as planned.  [name], PA-C    11/12/2017 12:52 PM

## 2017-11-13 ENCOUNTER — Encounter: Payer: Self-pay | Admitting: Thoracic Surgery (Cardiothoracic Vascular Surgery)

## 2017-11-13 ENCOUNTER — Other Ambulatory Visit: Payer: Self-pay | Admitting: *Deleted

## 2017-11-13 ENCOUNTER — Ambulatory Visit (INDEPENDENT_AMBULATORY_CARE_PROVIDER_SITE_OTHER): Payer: 59 | Admitting: Thoracic Surgery (Cardiothoracic Vascular Surgery)

## 2017-11-13 ENCOUNTER — Other Ambulatory Visit: Payer: Self-pay

## 2017-11-13 ENCOUNTER — Ambulatory Visit: Payer: 59 | Admitting: Thoracic Surgery (Cardiothoracic Vascular Surgery)

## 2017-11-13 VITALS — BP 164/84 | HR 73 | Resp 16 | Ht 67.0 in | Wt 196.0 lb

## 2017-11-13 DIAGNOSIS — J9859 Other diseases of mediastinum, not elsewhere classified: Secondary | ICD-10-CM

## 2017-11-13 NOTE — H&P (View-Only) (Signed)
[address]       [place] [address]             [phone]    HPI: [name] returns to discuss resection of her anterior mediastinal mass.  [name] is a 62 year old woman from [place] originally.  She has a past medical history significant for hypertension, hyperlipidemia, colon cancer entheses 2011), 2 episodes of DVT, and fibroids.  She recently was having left-sided chest pain that would at times radiate to her neck.  It was a very atypical.  Workup included a CT of the chest to rule out pulmonary embolus given her history of DVT.  There was no PE, but there was a 2.5 x 2.6 x 3.4 cm anterior mediastinal mass noted.  A PET/CT showed the anterior mediastinal mass was hypermetabolic.  She saw Dr. Ena Dawley of cardiology.  A cardiac CT showed a calcium score of 0.  Denies any blurred or double vision or unusual weakness.  Past Medical History:  Diagnosis Date  . Cancer ([place]) 08/03/2010   colon  . 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 HLD (hyperlipidemia)   . Hypertension   . Uterine fibroid    Past Surgical History:  Procedure Laterality Date  . ABDOMINAL HYSTERECTOMY    . CHOLECYSTECTOMY    . COLON SURGERY    . GALLBLADDER SURGERY      Current Outpatient Medications  Medication Sig Dispense Refill  . amLODipine (NORVASC) 5 MG tablet Take 1 tablet (5 mg total) by mouth daily. 30 tablet 11  . gabapentin (NEURONTIN) 300 MG capsule Take 1 capsule (300 mg total) 3 (three) times daily by mouth. 90 capsule 3  . hydrochlorothiazide (HYDRODIURIL) 25 MG tablet TAKE ONE TABLET BY MOUTH ONE TIME DAILY 90 tablet 3  . lisinopril (PRINIVIL,ZESTRIL) 40 MG tablet TAKE 1 TABLET (40 MG TOTAL) BY MOUTH DAILY. 90 tablet 1  . lovastatin (MEVACOR) 20 MG tablet Take 1 tablet (20 mg total) by mouth at bedtime. 90 tablet 3  . metoprolol tartrate (LOPRESSOR) 50 MG tablet Take 1 tablet (50 mg total) by  mouth once for 1 dose. 1 tablet 0  . rivaroxaban (XARELTO) 20 MG TABS tablet Take 20 mg by mouth daily with supper.    Marland Kitchen spironolactone (ALDACTONE) 25 MG tablet Take 1 tablet (25 mg total) by mouth daily. 90 tablet 3   No current facility-administered medications for this visit.     Physical Exam BP (!) 164/84 (BP Location: Left Arm, Patient Position: Sitting, Cuff Size: Large)   Pulse 73   Resp 16   Ht 5\' 7"  (1.702 m)   Wt 196 lb (88.9 kg)   SpO2 98% Comment: ON RA  BMI 30.22 kg/m  62 year old woman in no acute distress Overweight Alert and oriented x3 with no focal deficits HEENT unremarkable No cervical or subclavicular adenopathy Lungs clear Cardiac regular rate and rhythm normal S1-S2  Diagnostic Tests: COMPARISON:  CT chest 09/04/2017  FINDINGS: The anterior mediastinal mass is again noted measuring 3.5 by 2.5 cm, image 16/11. Previously this measured the same. The visualized portions of the trachea and lower airway are patent. Unremarkable appearance of the esophagus. No adenopathy identified.  No pleural effusion. Pulmonary nodule in the left lower lobe measures 4 mm, image 30/12. No airspace consolidation or atelectasis.  No acute findings within the upper abdomen.  No focal bone abnormality.  IMPRESSION: 1. Stable appearance  of anterior mediastinal mass. 2. 4 mm left lower lobe pulmonary nodule noted.   Electronically Signed   By: Kerby Moors M.D.   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  EXAM: Cardiac/Coronary  CT  TECHNIQUE: The patient was scanned on a [employer].  FINDINGS: A 120 kV prospective scan was triggered in the descending thoracic aorta at 111 HU's. Axial non-contrast 3 mm slices were carried out through the heart. The data set was  analyzed on a dedicated work station and scored using the [place]. Gantry rotation speed was 250 msecs and collimation was .6 mm. No beta blockade and 0.8 mg of sl NTG was given. The 3D data set was reconstructed in 5% intervals of the 67-82 % of the R-R cycle. Diastolic phases were analyzed on a dedicated work station using MPR, MIP and VRT modes. The patient received 80 cc of contrast.  Aorta: Normal size. Mild calcifications in the aortic arch. Mild atherosclerotic plaque in the descending aorta. No dissection.  Aortic Valve:  Trileaflet.  No calcifications.  Coronary Arteries:  Normal coronary origin.  Right dominance.  RCA is a large dominant artery that gives rise to PDA and PLVB. There is no plaque.  Left main is a large artery that gives rise to LAD and LCX arteries.  LAD is a large vessel that gives rise to two diagonal arteries. There is minimal non-calcified plaque in the proximal LAD.  LCX is a non-dominant artery that gives rise to one large OM1 branch. There is no plaque.  Other findings:  Normal pulmonary vein drainage into the left atrium.  Normal let atrial appendage without a thrombus.  Normal size of the pulmonary artery.  IMPRESSION: 1. Coronary calcium score of 0. This was 0 percentile for age and sex matched control.  2. Normal coronary origin with right dominance.  3. The study is significantly affected by motion, however there is no significant plaque in any of the coronary arteries.  Electronically Signed: By: Ena Dawley On: 11/11/2017 15:25    NUCLEAR MEDICINE PET SKULL BASE TO THIGH  TECHNIQUE: 10.2 mCi F-18 FDG was injected intravenously. Full-ring PET imaging was performed from the skull base to thigh after the radiotracer. CT data was obtained and used for attenuation correction and anatomic localization.  FASTING BLOOD GLUCOSE:  Value: 94 mg/dl  COMPARISON:  None.  FINDINGS: NECK  No  hypermetabolic lymph nodes in the neck.  CHEST  Smoothly marginated anterior mediastinal mass measuring 3.1 by 2.6 mm cm has associated metabolic activity with SUV max equal 4.9. This lesion is enlarged from CT 03/10/2013 (9 by 21 mm) and similar to recent CT 09/04/2017.  No suspicious pulmonary nodules.  ABDOMEN/PELVIS  No abnormal hypermetabolic activity within the liver, pancreas, adrenal glands, or spleen. No hypermetabolic lymph nodes in the abdomen or pelvis.  There is a anastomosis in the mid rectum without associated metabolic activity or obstruction no hypermetabolic pelvic lymph nodes or retroperitoneal lymph nodes.  SKELETON  No focal hypermetabolic activity to suggest skeletal metastasis.  IMPRESSION: 1. Slowly enlarging anterior mediastinal mass with smooth borders and moderate metabolic activity. 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 Recommend tissue sampling. 2. No additional evidence of colorectal cancer local recurrence or metastasis.  These results will be called to  the ordering clinician or representative by the Radiologist Assistant, and communication documented in the PACS or zVision Dashboard.   Electronically Signed   By: Suzy Bouchard M.D.   On: 09/23/2017 17:28 I personally reviewed the CT and PET/CT images and concur with the findings noted above.  Impression: [name] is a 62 year old woman who was incidentally found to have an anterior mediastinal mass during evaluation of atypical chest pain.  Coronary disease and pulmonary emboli have been ruled out as potential causes of her pain.  Given the left-sided nature of the pain I doubt is related to the anterior mediastinal mass either.  This mass is slowly growing, but does have significant metabolic activity on PET/CT.  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  Lymphoma, germ cell tumors, and ectopic thyroid are also within the  differential diagnosis.  There is not appear to be any invasion of surrounding structures.  I recommended to [name] that we do a partial sternotomy for resection of the mass.  I informed her, her husband, and her son of the general nature of the procedure including the need for general anesthesia, the incision to be used, the expected hospital stay, and the overall recovery.  I informed them of the indications, risks, benefits, and alternatives.  They understand the risks of surgery include but are not limited to death, DVT, PE, MI, bleeding, possible need for transfusion, infection, phrenic nerve injury, as well as the possibility of other unforeseeable complications.  She understands the risks and wishes to proceed.  She was instructed to hold Xarelto for 2 days prior to surgery.  Plan: Hold Xarelto after evening dose on Friday, 11/21/2017 Partial sternotomy for resection of anterior mediastinal mass on Monday, 11/24/2017  [name], MD [facility] [phone]

## 2017-11-13 NOTE — Progress Notes (Signed)
[address]       [place] [address]             [phone]    HPI: [name] returns to discuss resection of her anterior mediastinal mass.  [name] is a 62 year old woman from [place] originally.  She has a past medical history significant for hypertension, hyperlipidemia, colon cancer entheses 2011), 2 episodes of DVT, and fibroids.  She recently was having left-sided chest pain that would at times radiate to her neck.  It was a very atypical.  Workup included a CT of the chest to rule out pulmonary embolus given her history of DVT.  There was no PE, but there was a 2.5 x 2.6 x 3.4 cm anterior mediastinal mass noted.  A PET/CT showed the anterior mediastinal mass was hypermetabolic.  She saw Dr. Ena Dawley of cardiology.  A cardiac CT showed a calcium score of 0.  Denies any blurred or double vision or unusual weakness.  Past Medical History:  Diagnosis Date  . Cancer ([facility]) 08/03/2010   colon  . 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 HLD (hyperlipidemia)   . Hypertension   . Uterine fibroid    Past Surgical History:  Procedure Laterality Date  . ABDOMINAL HYSTERECTOMY    . CHOLECYSTECTOMY    . COLON SURGERY    . GALLBLADDER SURGERY      Current Outpatient Medications  Medication Sig Dispense Refill  . amLODipine (NORVASC) 5 MG tablet Take 1 tablet (5 mg total) by mouth daily. 30 tablet 11  . gabapentin (NEURONTIN) 300 MG capsule Take 1 capsule (300 mg total) 3 (three) times daily by mouth. 90 capsule 3  . hydrochlorothiazide (HYDRODIURIL) 25 MG tablet TAKE ONE TABLET BY MOUTH ONE TIME DAILY 90 tablet 3  . lisinopril (PRINIVIL,ZESTRIL) 40 MG tablet TAKE 1 TABLET (40 MG TOTAL) BY MOUTH DAILY. 90 tablet 1  . lovastatin (MEVACOR) 20 MG tablet Take 1 tablet (20 mg total) by mouth at bedtime. 90 tablet 3  . metoprolol tartrate (LOPRESSOR) 50 MG tablet Take 1 tablet (50 mg total) by  mouth once for 1 dose. 1 tablet 0  . rivaroxaban (XARELTO) 20 MG TABS tablet Take 20 mg by mouth daily with supper.    Marland Kitchen spironolactone (ALDACTONE) 25 MG tablet Take 1 tablet (25 mg total) by mouth daily. 90 tablet 3   No current facility-administered medications for this visit.     Physical Exam BP (!) 164/84 (BP Location: Left Arm, Patient Position: Sitting, Cuff Size: Large)   Pulse 73   Resp 16   Ht 5\' 7"  (1.702 m)   Wt 196 lb (88.9 kg)   SpO2 98% Comment: ON RA  BMI 30.57 kg/m  62 year old woman in no acute distress Overweight Alert and oriented x3 with no focal deficits HEENT unremarkable No cervical or subclavicular adenopathy Lungs clear Cardiac regular rate and rhythm normal S1-S2  Diagnostic Tests: COMPARISON:  CT chest 09/04/2017  FINDINGS: The anterior mediastinal mass is again noted measuring 3.5 by 2.5 cm, image 16/11. Previously this measured the same. The visualized portions of the trachea and lower airway are patent. Unremarkable appearance of the esophagus. No adenopathy identified.  No pleural effusion. Pulmonary nodule in the left lower lobe measures 4 mm, image 30/12. No airspace consolidation or atelectasis.  No acute findings within the upper abdomen.  No focal bone abnormality.  IMPRESSION: 1. Stable appearance  of anterior mediastinal mass. 2. 4 mm left lower lobe pulmonary nodule noted.   Electronically Signed   By: Kerby Moors M.D.   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  EXAM: Cardiac/Coronary  CT  TECHNIQUE: The patient was scanned on a [employer].  FINDINGS: A 120 kV prospective scan was triggered in the descending thoracic aorta at 111 HU's. Axial non-contrast 3 mm slices were carried out through the heart. The data set was  analyzed on a dedicated work station and scored using the [place]. Gantry rotation speed was 250 msecs and collimation was .6 mm. No beta blockade and 0.8 mg of sl NTG was given. The 3D data set was reconstructed in 5% intervals of the 67-82 % of the R-R cycle. Diastolic phases were analyzed on a dedicated work station using MPR, MIP and VRT modes. The patient received 80 cc of contrast.  Aorta: Normal size. Mild calcifications in the aortic arch. Mild atherosclerotic plaque in the descending aorta. No dissection.  Aortic Valve:  Trileaflet.  No calcifications.  Coronary Arteries:  Normal coronary origin.  Right dominance.  RCA is a large dominant artery that gives rise to PDA and PLVB. There is no plaque.  Left main is a large artery that gives rise to LAD and LCX arteries.  LAD is a large vessel that gives rise to two diagonal arteries. There is minimal non-calcified plaque in the proximal LAD.  LCX is a non-dominant artery that gives rise to one large OM1 branch. There is no plaque.  Other findings:  Normal pulmonary vein drainage into the left atrium.  Normal let atrial appendage without a thrombus.  Normal size of the pulmonary artery.  IMPRESSION: 1. Coronary calcium score of 0. This was 0 percentile for age and sex matched control.  2. Normal coronary origin with right dominance.  3. The study is significantly affected by motion, however there is no significant plaque in any of the coronary arteries.  Electronically Signed: By: Ena Dawley On: 11/11/2017 15:25    NUCLEAR MEDICINE PET SKULL BASE TO THIGH  TECHNIQUE: 10.2 mCi F-18 FDG was injected intravenously. Full-ring PET imaging was performed from the skull base to thigh after the radiotracer. CT data was obtained and used for attenuation correction and anatomic localization.  FASTING BLOOD GLUCOSE:  Value: 94 mg/dl  COMPARISON:  None.  FINDINGS: NECK  No  hypermetabolic lymph nodes in the neck.  CHEST  Smoothly marginated anterior mediastinal mass measuring 3.1 by 2.6 mm cm has associated metabolic activity with SUV max equal 4.9. This lesion is enlarged from CT 03/10/2013 (9 by 21 mm) and similar to recent CT 09/04/2017.  No suspicious pulmonary nodules.  ABDOMEN/PELVIS  No abnormal hypermetabolic activity within the liver, pancreas, adrenal glands, or spleen. No hypermetabolic lymph nodes in the abdomen or pelvis.  There is a anastomosis in the mid rectum without associated metabolic activity or obstruction no hypermetabolic pelvic lymph nodes or retroperitoneal lymph nodes.  SKELETON  No focal hypermetabolic activity to suggest skeletal metastasis.  IMPRESSION: 1. Slowly enlarging anterior mediastinal mass with smooth borders and moderate metabolic activity. 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 Recommend tissue sampling. 2. No additional evidence of colorectal cancer local recurrence or metastasis.  These results will be called to  the ordering clinician or representative by the Radiologist Assistant, and communication documented in the PACS or zVision Dashboard.   Electronically Signed   By: Suzy Bouchard M.D.   On: 09/23/2017 17:28 I personally reviewed the CT and PET/CT images and concur with the findings noted above.  Impression: [name] is a 62 year old woman who was incidentally found to have an anterior mediastinal mass during evaluation of atypical chest pain.  Coronary disease and pulmonary emboli have been ruled out as potential causes of her pain.  Given the left-sided nature of the pain I doubt is related to the anterior mediastinal mass either.  This mass is slowly growing, but does have significant metabolic activity on PET/CT.  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  Lymphoma, germ cell tumors, and ectopic thyroid are also within the  differential diagnosis.  There is not appear to be any invasion of surrounding structures.  I recommended to [name] that we do a partial sternotomy for resection of the mass.  I informed her, her husband, and her son of the general nature of the procedure including the need for general anesthesia, the incision to be used, the expected hospital stay, and the overall recovery.  I informed them of the indications, risks, benefits, and alternatives.  They understand the risks of surgery include but are not limited to death, DVT, PE, MI, bleeding, possible need for transfusion, infection, phrenic nerve injury, as well as the possibility of other unforeseeable complications.  She understands the risks and wishes to proceed.  She was instructed to hold Xarelto for 2 days prior to surgery.  Plan: Hold Xarelto after evening dose on Friday, 11/21/2017 Partial sternotomy for resection of anterior mediastinal mass on Monday, 11/24/2017  [name], MD [facility] [phone]

## 2017-11-19 NOTE — Pre-Procedure Instructions (Signed)
[name]  [birth year]      [id number] - [place], [place] - [address] [place] [address] Phone: [phone] Fax: [phone]  [facility] [place], [place] AT [address] [place] [address] Phone: [phone] Fax: [phone]  [facility] [place], [address] AT [place] [address] Phone: [phone] Fax: [phone]    Your procedure is scheduled on 11/24/2017.  Report to [facility] Admitting at [place].M.  Call this number if you have problems the morning of surgery:  [phone]   Remember:  Do not eat food or drink liquids after midnight.   Continue all medications as directed by your physician except follow these medication instructions before surgery below   Take these medicines the morning of surgery with A SIP OF WATER: Amlodipine (Novasc) Gabapentin (Neurontin) Metoprolol (Lopressor)  7 days prior to surgery STOP taking any Aspirin(unless otherwise instructed by your surgeon), Aleve, Naproxen, Ibuprofen, Motrin, Advil, Goody's, BC's, all herbal medications, fish oil, and all vitamins  Follow your doctors instructions regarding your Xarelto.  If no instructions were given by your doctor, then you will need to call the prescribing office office to get instructions.       Do not wear jewelry, make-up or nail polish.  Do not wear lotions, powders, or perfumes, or deodorant.  Do not shave 48 hours prior to surgery.    Do not bring valuables to the hospital.  [facility] is not responsible for any belongings or valuables.  Hearing aids, eyeglasses, contacts, dentures or bridgework may not be worn into surgery.  Leave your suitcase in the car.  After surgery it may be brought to your room.  For patients admitted to the hospital, discharge time  will be determined by your treatment team.  Patients discharged the day of surgery will not be allowed to drive home.   Name and phone number of your driver:    Special instructions:   [facility]- Preparing For Surgery  Before surgery, you can play an important role. Because skin is not sterile, your skin needs to be as free of germs as possible. You can reduce the number of germs on your skin by washing with CHG (chlorahexidine gluconate) Soap before surgery.  CHG is an antiseptic cleaner which kills germs and bonds with the skin to continue killing germs even after washing.  Please do not use if you have an allergy to CHG or antibacterial soaps. If your skin becomes reddened/irritated stop using the CHG.  Do not shave (including legs and underarms) for at least 48 hours prior to first CHG shower. It is OK to shave your face.  Please follow these instructions carefully.   1. Shower the NIGHT BEFORE SURGERY and the MORNING OF SURGERY with CHG.   2. If you chose to wash your hair, wash your hair first as usual with your normal shampoo.  3. After you shampoo, rinse your hair and body thoroughly to remove the shampoo.  4. Use CHG as you would any other liquid soap. You can apply CHG directly to the skin and wash gently with a scrungie or a clean washcloth.   5. Apply the CHG Soap to your body ONLY FROM THE NECK DOWN.  Do not use on open wounds or open  sores. Avoid contact with your eyes, ears, mouth and genitals (private parts). Wash Face and genitals (private parts)  with your normal soap.  6. Wash thoroughly, paying special attention to the area where your surgery will be performed.  7. Thoroughly rinse your body with warm water from the neck down.  8. DO NOT shower/wash with your normal soap after using and rinsing off the CHG Soap.  9. Pat yourself dry with a CLEAN TOWEL.  10. Wear CLEAN PAJAMAS to bed the night before surgery, wear comfortable clothes the morning of  surgery  11. Place CLEAN SHEETS on your bed the night of your first shower and DO NOT SLEEP WITH PETS.    Day of Surgery: Shower as stated above. Do not apply any deodorants/lotions.  Please wear clean clothes to the hospital/surgery center.      Please read over the following fact sheets that you were given.

## 2017-11-20 ENCOUNTER — Encounter (HOSPITAL_COMMUNITY)
Admission: RE | Admit: 2017-11-20 | Discharge: 2017-11-20 | Disposition: A | Discharge status: Home / Self Care | Payer: 59 | Source: Ambulatory Visit | Attending: Thoracic Surgery (Cardiothoracic Vascular Surgery) | Admitting: Thoracic Surgery (Cardiothoracic Vascular Surgery)

## 2017-11-20 ENCOUNTER — Encounter (HOSPITAL_COMMUNITY): Payer: Self-pay

## 2017-11-20 ENCOUNTER — Other Ambulatory Visit: Payer: Self-pay

## 2017-11-20 ENCOUNTER — Other Ambulatory Visit (HOSPITAL_COMMUNITY): Payer: 59

## 2017-11-20 DIAGNOSIS — J9859 Other diseases of mediastinum, not elsewhere classified: Secondary | ICD-10-CM | POA: Insufficient documentation

## 2017-11-20 DIAGNOSIS — Z01812 Encounter for preprocedural laboratory examination: Secondary | ICD-10-CM | POA: Diagnosis present

## 2017-11-20 LAB — URINALYSIS, ROUTINE W REFLEX MICROSCOPIC
Bacteria, UA: NONE SEEN
Bilirubin Urine: NEGATIVE
GLUCOSE, UA: NEGATIVE mg/dL
HGB URINE DIPSTICK: NEGATIVE
KETONES UR: NEGATIVE mg/dL
LEUKOCYTES UA: NEGATIVE
NITRITE: NEGATIVE
PROTEIN: NEGATIVE mg/dL
Specific Gravity, Urine: 1.006 (ref 1.005–1.030)
Squamous Epithelial / LPF: NONE SEEN
pH: 8 (ref 5.0–8.0)

## 2017-11-20 LAB — SURGICAL PCR SCREEN
MRSA, PCR: NEGATIVE
Staphylococcus aureus: NEGATIVE

## 2017-11-20 LAB — COMPREHENSIVE METABOLIC PANEL
ALBUMIN: 4.3 g/dL (ref 3.5–5.0)
ALK PHOS: 82 U/L (ref 38–126)
ALT: 20 U/L (ref 14–54)
AST: 22 U/L (ref 15–41)
Anion gap: 12 (ref 5–15)
BUN: 12 mg/dL (ref 6–20)
CALCIUM: 9.1 mg/dL (ref 8.9–10.3)
CO2: 19 mmol/L — ABNORMAL LOW (ref 22–32)
CREATININE: 0.75 mg/dL (ref 0.44–1.00)
Chloride: 109 mmol/L (ref 101–111)
GFR calc non Af Amer: >60 mL/min (ref >60)
GLUCOSE: 94 mg/dL (ref 65–99)
Potassium: 3.8 mmol/L (ref 3.5–5.1)
SODIUM: 140 mmol/L (ref 135–145)
Total Bilirubin: 0.7 mg/dL (ref 0.3–1.2)
Total Protein: 6.7 g/dL (ref 6.5–8.1)

## 2017-11-20 LAB — TYPE AND SCREEN
ABO/RH(D): O POS
Antibody Screen: NEGATIVE

## 2017-11-20 LAB — CBC
HCT: 40.1 % (ref 36.0–46.0)
HEMOGLOBIN: 13.1 g/dL (ref 12.0–15.0)
MCH: 30.8 pg (ref 26.0–34.0)
MCHC: 32.7 g/dL (ref 30.0–36.0)
MCV: 94.1 fL (ref 78.0–100.0)
Platelets: 175 10*3/uL (ref 150–400)
RBC: 4.26 MIL/uL (ref 3.87–5.11)
RDW: 13.4 % (ref 11.5–15.5)
WBC: 4.9 10*3/uL (ref 4.0–10.5)

## 2017-11-20 LAB — PROTIME-INR
INR: 0.89
Prothrombin Time: 12 seconds (ref 11.4–15.2)

## 2017-11-20 LAB — ABO/RH: ABO/RH(D): O POS

## 2017-11-20 LAB — APTT: aPTT: 23 seconds — ABNORMAL LOW (ref 24–36)

## 2017-11-21 NOTE — Progress Notes (Signed)
Anesthesia Chart Review: Patient is a 62 year old female scheduled for partial sternotomy, resection of anterior mediastinal mass on 11/24/17 by Dr. Modesto Charon.  She was found to have an anterior mediastinal mass during 09/04/17 ED visit for chest pain. Negative troponin and d-dimer.  History includes never smoker, hypertension, colon cancer (s/p low anterior resection 08/03/10; lymph nodes negative), hyperlipidemia, DVT (recurrent LLE; on chronic anticoagulation per HEM-ONC if no high risk bleeding 06/19/17), hysterectomy (fibroids) 12/11/04, cholecystectomy 07/30/05. Patient is originally for [place].   - PCP is Dr. Silvestre Mesi. - Cardiologist is Dr. Ena Dawley. Last visit with [name], PA-C on 10/27/17.  Amlodipine added for hypertension.  Beta-blocker not started due to bradycardia. She had a coronary CT on 11/11/17 as part of chest pain evaluation, and she had a calcium score of 0 with no significant plaque in the coronary arteries. - HEM-ONC is Dr. Truitt Merle. Per 06/19/17 notation by Dr. Wylene Simmer, Dr. Burr Medico recommended long term anticoagulation given history of unprovoked DVT.   Med list currently includes amlodipine, Neurontin, HCTZ, lisinopril, lovastatin, Xarelto, Aldactone. Dr. Roxan Hockey advised that patient hold Xarelto after 11/21/17 dose, but PAT RN handwritten notation indicated that patient already stopped it on 11/19/17.   BP (!) 173/84   Pulse 69   Temp 36.7 C   Resp 20   Ht 5\' 7"  (1.702 m)   SpO2 100%   BMI 30.70 kg/m   EKG 11/20/17: Normal sinus rhythm, nonspecific ST/T wave abnormality.  Coronary CT 11/11/17: IMPRESSION: 1. Coronary calcium score of 0. This was 0 percentile for age and sex matched control. 2. Normal coronary origin with right dominance. 3. The study is significantly affected by motion, however there is no significant plaque in any of the coronary arteries.  BLE venous Duplex 06/19/17: IMPRESSION: 1. Small occlusive thrombus within the left  posterior tibial vein consistent with below the knee venous thrombosis. 2. Negative for acute DVT in the right lower extremity.  PET scan 09/23/17: IMPRESSION: 1. Slowly enlarging anterior mediastinal mass with smooth borders and moderate metabolic activity. 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 Recommend tissue sampling. 2. No additional evidence of colorectal cancer local recurrence or metastasis.  Preoperative labs noted. Cr 0.75. CBC WNL. Glucose 94.   She is for chest x-ray and ABG on the day of surgery.   Anesthesiologist and surgeon to evaluate on the day of surgery.    George Hugh [facility] Short Stay Center/Anesthesiology Phone [phone] 11/21/2017 10:38 AM

## 2017-11-23 NOTE — Anesthesia Preprocedure Evaluation (Addendum)
Anesthesia Evaluation  Patient identified by MRN, date of birth, ID band Patient awake    Reviewed: Allergy & Precautions, H&P , NPO status , Patient's Chart, lab work & pertinent test results, reviewed documented beta blocker date and time   Airway Mallampati: III  TM Distance: >3 FB Neck ROM: Full    Dental  (+) Teeth Intact, Dental Advisory Given   Pulmonary neg pulmonary ROS,    breath sounds clear to auscultation       Cardiovascular hypertension, Pt. on medications and Pt. on home beta blockers + Peripheral Vascular Disease   Rhythm:Regular Rate:Normal     Neuro/Psych negative neurological ROS  negative psych ROS   GI/Hepatic negative GI ROS, Neg liver ROS,   Endo/Other  negative endocrine ROS  Renal/GU negative Renal ROS     Musculoskeletal negative musculoskeletal ROS (+)   Abdominal   Peds  Hematology negative hematology ROS (+)   Anesthesia Other Findings   Reproductive/Obstetrics negative OB ROS                           Anesthesia Physical Anesthesia Plan  ASA: III  Anesthesia Plan: General   Post-op Pain Management:    Induction: Intravenous  PONV Risk Score and Plan: 4 or greater and Ondansetron, Dexamethasone and Treatment may vary due to age or medical condition  Airway Management Planned: Oral ETT  Additional Equipment: Arterial line, CVP and Ultrasound Guidance Line Placement  Intra-op Plan:   Post-operative Plan: Extubation in OR  Informed Consent: I have reviewed the patients History and Physical, chart, labs and discussed the procedure including the risks, benefits and alternatives for the proposed anesthesia with the patient or authorized representative who has indicated his/her understanding and acceptance.   Dental advisory given  Plan Discussed with: CRNA  Anesthesia Plan Comments:         Anesthesia Quick Evaluation

## 2017-11-24 ENCOUNTER — Inpatient Hospital Stay (HOSPITAL_COMMUNITY): Payer: 59

## 2017-11-24 ENCOUNTER — Inpatient Hospital Stay (HOSPITAL_COMMUNITY): Payer: 59 | Admitting: Certified Registered Nurse Anesthetist

## 2017-11-24 ENCOUNTER — Inpatient Hospital Stay (HOSPITAL_COMMUNITY)
Admission: RE | Admit: 2017-11-24 | Discharge: 2017-11-27 | DRG: 804 | Disposition: A | Discharge status: Home / Self Care | Payer: 59 | Source: Ambulatory Visit | Attending: Thoracic Surgery (Cardiothoracic Vascular Surgery) | Admitting: Thoracic Surgery (Cardiothoracic Vascular Surgery)

## 2017-11-24 ENCOUNTER — Encounter (HOSPITAL_COMMUNITY)
Admission: RE | Disposition: A | Discharge status: Home / Self Care | Payer: Self-pay | Source: Ambulatory Visit | Attending: Thoracic Surgery (Cardiothoracic Vascular Surgery)

## 2017-11-24 ENCOUNTER — Other Ambulatory Visit: Payer: Self-pay

## 2017-11-24 ENCOUNTER — Encounter (HOSPITAL_COMMUNITY): Payer: Self-pay | Admitting: *Deleted

## 2017-11-24 ENCOUNTER — Ambulatory Visit: Payer: 59

## 2017-11-24 DIAGNOSIS — I10 Essential (primary) hypertension: Secondary | ICD-10-CM | POA: Diagnosis present

## 2017-11-24 DIAGNOSIS — R918 Other nonspecific abnormal finding of lung field: Secondary | ICD-10-CM | POA: Diagnosis not present

## 2017-11-24 DIAGNOSIS — Z85038 Personal history of other malignant neoplasm of large intestine: Secondary | ICD-10-CM | POA: Diagnosis not present

## 2017-11-24 DIAGNOSIS — E785 Hyperlipidemia, unspecified: Secondary | ICD-10-CM | POA: Diagnosis present

## 2017-11-24 DIAGNOSIS — I739 Peripheral vascular disease, unspecified: Secondary | ICD-10-CM | POA: Diagnosis present

## 2017-11-24 DIAGNOSIS — J9859 Other diseases of mediastinum, not elsewhere classified: Secondary | ICD-10-CM | POA: Diagnosis not present

## 2017-11-24 DIAGNOSIS — Z7901 Long term (current) use of anticoagulants: Secondary | ICD-10-CM

## 2017-11-24 DIAGNOSIS — R590 Localized enlarged lymph nodes: Secondary | ICD-10-CM | POA: Diagnosis not present

## 2017-11-24 DIAGNOSIS — R911 Solitary pulmonary nodule: Secondary | ICD-10-CM | POA: Diagnosis present

## 2017-11-24 DIAGNOSIS — J9811 Atelectasis: Secondary | ICD-10-CM | POA: Diagnosis not present

## 2017-11-24 DIAGNOSIS — Z9049 Acquired absence of other specified parts of digestive tract: Secondary | ICD-10-CM | POA: Diagnosis not present

## 2017-11-24 DIAGNOSIS — Z90710 Acquired absence of both cervix and uterus: Secondary | ICD-10-CM | POA: Diagnosis not present

## 2017-11-24 DIAGNOSIS — I8390 Asymptomatic varicose veins of unspecified lower extremity: Secondary | ICD-10-CM | POA: Diagnosis not present

## 2017-11-24 DIAGNOSIS — Z9089 Acquired absence of other organs: Secondary | ICD-10-CM

## 2017-11-24 DIAGNOSIS — D150 Benign neoplasm of thymus: Secondary | ICD-10-CM | POA: Diagnosis not present

## 2017-11-24 DIAGNOSIS — R222 Localized swelling, mass and lump, trunk: Secondary | ICD-10-CM | POA: Diagnosis not present

## 2017-11-24 DIAGNOSIS — J942 Hemothorax: Secondary | ICD-10-CM | POA: Diagnosis not present

## 2017-11-24 DIAGNOSIS — Z452 Encounter for adjustment and management of vascular access device: Secondary | ICD-10-CM | POA: Diagnosis not present

## 2017-11-24 DIAGNOSIS — Z86718 Personal history of other venous thrombosis and embolism: Secondary | ICD-10-CM

## 2017-11-24 DIAGNOSIS — Z09 Encounter for follow-up examination after completed treatment for conditions other than malignant neoplasm: Secondary | ICD-10-CM

## 2017-11-24 HISTORY — PX: MEDIASTERNOTOMY: SHX5084

## 2017-11-24 HISTORY — PX: THYMECTOMY: SHX6121

## 2017-11-24 LAB — BLOOD GAS, ARTERIAL
Acid-Base Excess: 2.8 mmol/L — ABNORMAL HIGH (ref 0.0–2.0)
Bicarbonate: 27.2 mmol/L (ref 20.0–28.0)
Drawn by: 470591
FIO2: 21
O2 Saturation: 97.8 %
PH ART: 7.399 (ref 7.350–7.450)
PO2 ART: 101 mmHg (ref 83.0–108.0)
Patient temperature: 98.6
pCO2 arterial: 45 mmHg (ref 32.0–48.0)

## 2017-11-24 SURGERY — MEDIAN STERNOTOMY
Anesthesia: General

## 2017-11-24 MED ORDER — FENTANYL CITRATE (PF) 250 MCG/5ML IJ SOLN
INTRAMUSCULAR | Status: AC
  Filled 2017-11-24: qty 15

## 2017-11-24 MED ORDER — CEFAZOLIN SODIUM-DEXTROSE 2-4 GM/100ML-% IV SOLN
2.0000 g | Freq: Three times a day (TID) | INTRAVENOUS | Status: AC
  Administered 2017-11-24 – 2017-11-25 (×2): 2 g via INTRAVENOUS
  Filled 2017-11-24 (×2): qty 100

## 2017-11-24 MED ORDER — MIDAZOLAM HCL 5 MG/5ML IJ SOLN
INTRAMUSCULAR | Status: DC | PRN
  Administered 2017-11-24: 2 mg via INTRAVENOUS

## 2017-11-24 MED ORDER — PROMETHAZINE HCL 25 MG/ML IJ SOLN
INTRAMUSCULAR | Status: AC
  Filled 2017-11-24: qty 1

## 2017-11-24 MED ORDER — ONDANSETRON HCL 4 MG/2ML IJ SOLN
INTRAMUSCULAR | Status: DC | PRN
  Administered 2017-11-24: 4 mg via INTRAVENOUS

## 2017-11-24 MED ORDER — GABAPENTIN 300 MG PO CAPS
300.0000 mg | ORAL_CAPSULE | Freq: Three times a day (TID) | ORAL | Status: DC
  Administered 2017-11-25 – 2017-11-27 (×7): 300 mg via ORAL
  Filled 2017-11-24 (×7): qty 1

## 2017-11-24 MED ORDER — ACETAMINOPHEN 500 MG PO TABS
1000.0000 mg | ORAL_TABLET | Freq: Four times a day (QID) | ORAL | Status: DC
  Administered 2017-11-24 – 2017-11-27 (×9): 1000 mg via ORAL
  Filled 2017-11-24 (×10): qty 2

## 2017-11-24 MED ORDER — MIDAZOLAM HCL 2 MG/2ML IJ SOLN
INTRAMUSCULAR | Status: AC
  Filled 2017-11-24: qty 2

## 2017-11-24 MED ORDER — NALOXONE HCL 0.4 MG/ML IJ SOLN: 0.4000 mg | INTRAMUSCULAR | Status: DC | PRN

## 2017-11-24 MED ORDER — LACTATED RINGERS IV SOLN
INTRAVENOUS | Status: DC | PRN
  Administered 2017-11-24 (×2): via INTRAVENOUS

## 2017-11-24 MED ORDER — FENTANYL 40 MCG/ML IV SOLN
INTRAVENOUS | Status: DC
  Administered 2017-11-24: 20 ug via INTRAVENOUS
  Administered 2017-11-24: 1000 ug via INTRAVENOUS
  Administered 2017-11-24: 40 ug via INTRAVENOUS
  Administered 2017-11-25: 10 ug via INTRAVENOUS
  Administered 2017-11-25 – 2017-11-26 (×7): 0 ug via INTRAVENOUS

## 2017-11-24 MED ORDER — ALBUTEROL SULFATE (2.5 MG/3ML) 0.083% IN NEBU: 2.5000 mg | INHALATION_SOLUTION | RESPIRATORY_TRACT | Status: DC | PRN

## 2017-11-24 MED ORDER — SENNOSIDES-DOCUSATE SODIUM 8.6-50 MG PO TABS
1.0000 | ORAL_TABLET | Freq: Every day | ORAL | Status: DC
  Administered 2017-11-25 – 2017-11-26 (×2): 1 via ORAL
  Filled 2017-11-24 (×2): qty 1

## 2017-11-24 MED ORDER — DEXAMETHASONE SODIUM PHOSPHATE 10 MG/ML IJ SOLN
INTRAMUSCULAR | Status: DC | PRN
  Administered 2017-11-24: 5 mg via INTRAVENOUS

## 2017-11-24 MED ORDER — LISINOPRIL 20 MG PO TABS
40.0000 mg | ORAL_TABLET | Freq: Every day | ORAL | Status: DC
  Administered 2017-11-25 – 2017-11-27 (×3): 40 mg via ORAL
  Filled 2017-11-24 (×3): qty 2

## 2017-11-24 MED ORDER — SODIUM CHLORIDE 0.9 % IJ SOLN
OROMUCOSAL | Status: DC | PRN
  Administered 2017-11-24 (×2): via TOPICAL

## 2017-11-24 MED ORDER — LACTATED RINGERS IV SOLN
INTRAVENOUS | Status: DC | PRN
  Administered 2017-11-24: 07:00:00 via INTRAVENOUS

## 2017-11-24 MED ORDER — HYDROMORPHONE HCL 2 MG/ML IJ SOLN
INTRAMUSCULAR | Status: AC
  Administered 2017-11-24: 0.5 mg via INTRAVENOUS
  Filled 2017-11-24: qty 1

## 2017-11-24 MED ORDER — MEPERIDINE HCL 50 MG/ML IJ SOLN: 6.2500 mg | INTRAMUSCULAR | Status: DC | PRN

## 2017-11-24 MED ORDER — DEXTROSE-NACL 5-0.9 % IV SOLN
INTRAVENOUS | Status: AC
  Administered 2017-11-24: 19:00:00 via INTRAVENOUS

## 2017-11-24 MED ORDER — OXYCODONE HCL 5 MG PO TABS
5.0000 mg | ORAL_TABLET | ORAL | Status: DC | PRN
  Administered 2017-11-24: 5 mg via ORAL
  Administered 2017-11-25: 10 mg via ORAL
  Filled 2017-11-24: qty 1
  Filled 2017-11-24: qty 2

## 2017-11-24 MED ORDER — ALBUTEROL SULFATE (2.5 MG/3ML) 0.083% IN NEBU
2.5000 mg | INHALATION_SOLUTION | RESPIRATORY_TRACT | Status: DC
  Administered 2017-11-24: 2.5 mg via RESPIRATORY_TRACT
  Filled 2017-11-24: qty 3

## 2017-11-24 MED ORDER — AMLODIPINE BESYLATE 5 MG PO TABS
5.0000 mg | ORAL_TABLET | Freq: Every day | ORAL | Status: DC
  Administered 2017-11-25 – 2017-11-27 (×3): 5 mg via ORAL
  Filled 2017-11-24 (×3): qty 1

## 2017-11-24 MED ORDER — PANTOPRAZOLE SODIUM 40 MG PO TBEC
40.0000 mg | DELAYED_RELEASE_TABLET | Freq: Every day | ORAL | Status: DC
  Administered 2017-11-24 – 2017-11-27 (×4): 40 mg via ORAL
  Filled 2017-11-24 (×4): qty 1

## 2017-11-24 MED ORDER — SODIUM CHLORIDE 0.9% FLUSH: 9.0000 mL | INTRAVENOUS | Status: DC | PRN

## 2017-11-24 MED ORDER — HYDROMORPHONE HCL 2 MG/ML IJ SOLN
0.2500 mg | INTRAMUSCULAR | Status: DC | PRN
  Administered 2017-11-24 (×3): 0.5 mg via INTRAVENOUS

## 2017-11-24 MED ORDER — ROCURONIUM BROMIDE 100 MG/10ML IV SOLN
INTRAVENOUS | Status: DC | PRN
  Administered 2017-11-24: 20 mg via INTRAVENOUS
  Administered 2017-11-24: 30 mg via INTRAVENOUS
  Administered 2017-11-24: 50 mg via INTRAVENOUS

## 2017-11-24 MED ORDER — SUCCINYLCHOLINE CHLORIDE 200 MG/10ML IV SOSY
PREFILLED_SYRINGE | INTRAVENOUS | Status: AC
  Filled 2017-11-24: qty 10

## 2017-11-24 MED ORDER — ENOXAPARIN SODIUM 40 MG/0.4ML ~~LOC~~ SOLN: 40.0000 mg | Freq: Every day | SUBCUTANEOUS | Status: DC

## 2017-11-24 MED ORDER — PROMETHAZINE HCL 25 MG/ML IJ SOLN: 6.2500 mg | INTRAMUSCULAR | Status: DC | PRN

## 2017-11-24 MED ORDER — HYDROCHLOROTHIAZIDE 25 MG PO TABS
25.0000 mg | ORAL_TABLET | Freq: Every day | ORAL | Status: DC
  Administered 2017-11-25 – 2017-11-27 (×3): 25 mg via ORAL
  Filled 2017-11-24 (×3): qty 1

## 2017-11-24 MED ORDER — ROCURONIUM BROMIDE 10 MG/ML (PF) SYRINGE
PREFILLED_SYRINGE | INTRAVENOUS | Status: AC
  Filled 2017-11-24: qty 5

## 2017-11-24 MED ORDER — BISACODYL 5 MG PO TBEC
10.0000 mg | DELAYED_RELEASE_TABLET | Freq: Every day | ORAL | Status: DC
  Administered 2017-11-25 – 2017-11-27 (×3): 10 mg via ORAL
  Filled 2017-11-24 (×3): qty 2

## 2017-11-24 MED ORDER — POTASSIUM CHLORIDE 10 MEQ/50ML IV SOLN
10.0000 meq | Freq: Every day | INTRAVENOUS | Status: DC | PRN
  Administered 2017-11-26 (×2): 10 meq via INTRAVENOUS
  Filled 2017-11-24 (×3): qty 50

## 2017-11-24 MED ORDER — SUGAMMADEX SODIUM 200 MG/2ML IV SOLN
INTRAVENOUS | Status: AC
  Filled 2017-11-24: qty 2

## 2017-11-24 MED ORDER — PHENYLEPHRINE HCL 10 MG/ML IJ SOLN
INTRAVENOUS | Status: DC | PRN
  Administered 2017-11-24: 25 ug/min via INTRAVENOUS

## 2017-11-24 MED ORDER — DEXAMETHASONE SODIUM PHOSPHATE 10 MG/ML IJ SOLN
INTRAMUSCULAR | Status: AC
  Filled 2017-11-24: qty 1

## 2017-11-24 MED ORDER — ONDANSETRON HCL 4 MG/2ML IJ SOLN
INTRAMUSCULAR | Status: AC
  Filled 2017-11-24: qty 2

## 2017-11-24 MED ORDER — CEFAZOLIN SODIUM-DEXTROSE 2-4 GM/100ML-% IV SOLN
2.0000 g | INTRAVENOUS | Status: AC
  Administered 2017-11-24: 2 g via INTRAVENOUS
  Filled 2017-11-24: qty 100

## 2017-11-24 MED ORDER — PROPOFOL 10 MG/ML IV BOLUS
INTRAVENOUS | Status: AC
  Filled 2017-11-24: qty 40

## 2017-11-24 MED ORDER — SPIRONOLACTONE 25 MG PO TABS
25.0000 mg | ORAL_TABLET | Freq: Every day | ORAL | Status: DC
  Administered 2017-11-25 – 2017-11-27 (×3): 25 mg via ORAL
  Filled 2017-11-24 (×3): qty 1

## 2017-11-24 MED ORDER — SUGAMMADEX SODIUM 200 MG/2ML IV SOLN
INTRAVENOUS | Status: DC | PRN
  Administered 2017-11-24: 200 mg via INTRAVENOUS

## 2017-11-24 MED ORDER — ACETAMINOPHEN 160 MG/5ML PO SOLN: 1000.0000 mg | Freq: Four times a day (QID) | ORAL | Status: DC

## 2017-11-24 MED ORDER — 0.9 % SODIUM CHLORIDE (POUR BTL) OPTIME
TOPICAL | Status: DC | PRN
  Administered 2017-11-24: 2000 mL

## 2017-11-24 MED ORDER — ONDANSETRON HCL 4 MG/2ML IJ SOLN
4.0000 mg | Freq: Four times a day (QID) | INTRAMUSCULAR | Status: DC | PRN
  Administered 2017-11-24: 4 mg via INTRAVENOUS
  Filled 2017-11-24: qty 2

## 2017-11-24 MED ORDER — PROPOFOL 10 MG/ML IV BOLUS
INTRAVENOUS | Status: DC | PRN
  Administered 2017-11-24: 100 mg via INTRAVENOUS
  Administered 2017-11-24: 60 mg via INTRAVENOUS

## 2017-11-24 MED ORDER — FENTANYL CITRATE (PF) 250 MCG/5ML IJ SOLN
INTRAMUSCULAR | Status: DC | PRN
  Administered 2017-11-24: 50 ug via INTRAVENOUS
  Administered 2017-11-24: 150 ug via INTRAVENOUS
  Administered 2017-11-24: 50 ug via INTRAVENOUS
  Administered 2017-11-24: 100 ug via INTRAVENOUS
  Administered 2017-11-24: 200 ug via INTRAVENOUS

## 2017-11-24 MED ORDER — DIPHENHYDRAMINE HCL 12.5 MG/5ML PO ELIX
12.5000 mg | ORAL_SOLUTION | Freq: Four times a day (QID) | ORAL | Status: DC | PRN
  Filled 2017-11-24: qty 5

## 2017-11-24 MED ORDER — DIPHENHYDRAMINE HCL 50 MG/ML IJ SOLN: 12.5000 mg | Freq: Four times a day (QID) | INTRAMUSCULAR | Status: DC | PRN

## 2017-11-24 MED ORDER — ONDANSETRON HCL 4 MG/2ML IJ SOLN: 4.0000 mg | Freq: Four times a day (QID) | INTRAMUSCULAR | Status: DC | PRN

## 2017-11-24 SURGICAL SUPPLY — 51 items
BLADE CORE FAN STRYKER (BLADE) ×1 IMPLANT
BLADE SURG 11 STRL SS (BLADE) IMPLANT
CANISTER SUCT 3000ML PPV (MISCELLANEOUS) ×3 IMPLANT
CATH THORACIC 28FR (CATHETERS) IMPLANT
CATH THORACIC 28FR RT ANG (CATHETERS) IMPLANT
CLIP VESOCCLUDE MED 24/CT (CLIP) ×1 IMPLANT
CLIP VESOCCLUDE SM WIDE 24/CT (CLIP) ×1 IMPLANT
CONT SPEC 4OZ CLIKSEAL STRL BL (MISCELLANEOUS) ×3 IMPLANT
COVER SURGICAL LIGHT HANDLE (MISCELLANEOUS) ×5 IMPLANT
DRAIN CHANNEL 19F RND (DRAIN) ×1 IMPLANT
DRAPE LAPAROSCOPIC ABDOMINAL (DRAPES) ×3 IMPLANT
DRSG AQUACEL AG ADV 3.5X 6 (GAUZE/BANDAGES/DRESSINGS) ×1 IMPLANT
ELECT REM PT RETURN 9FT ADLT (ELECTROSURGICAL) ×3
ELECTRODE REM PT RTRN 9FT ADLT (ELECTROSURGICAL) ×2 IMPLANT
EVACUATOR SILICONE 100CC (DRAIN) ×1 IMPLANT
GAUZE SPONGE 4X4 12PLY STRL (GAUZE/BANDAGES/DRESSINGS) ×3 IMPLANT
GAUZE SPONGE 4X4 12PLY STRL LF (GAUZE/BANDAGES/DRESSINGS) ×1 IMPLANT
GEL ULTRASOUND 20GR AQUASONIC (MISCELLANEOUS) ×3 IMPLANT
GLOVE BIO SURGEON STRL SZ7.5 (GLOVE) ×1 IMPLANT
GLOVE BIOGEL PI IND STRL 6.5 (GLOVE) IMPLANT
GLOVE BIOGEL PI INDICATOR 6.5 (GLOVE) ×3
GLOVE SURG SIGNA 7.5 PF LTX (GLOVE) ×3 IMPLANT
GOWN STRL REUS W/ TWL LRG LVL3 (GOWN DISPOSABLE) ×4 IMPLANT
GOWN STRL REUS W/ TWL XL LVL3 (GOWN DISPOSABLE) ×2 IMPLANT
GOWN STRL REUS W/TWL LRG LVL3 (GOWN DISPOSABLE) ×6
GOWN STRL REUS W/TWL XL LVL3 (GOWN DISPOSABLE) ×3
HEMOSTAT POWDER SURGIFOAM 1G (HEMOSTASIS) IMPLANT
KIT BASIN OR (CUSTOM PROCEDURE TRAY) ×3 IMPLANT
KIT SUCTION CATH 14FR (SUCTIONS) IMPLANT
KIT TURNOVER KIT B (KITS) ×3 IMPLANT
NS IRRIG 1000ML POUR BTL (IV SOLUTION) ×6 IMPLANT
PACK CHEST (CUSTOM PROCEDURE TRAY) ×3 IMPLANT
PAD ARMBOARD 7.5X6 YLW CONV (MISCELLANEOUS) ×6 IMPLANT
SUT PROLENE 4 0 RB 1 (SUTURE) ×3
SUT PROLENE 4-0 RB1 .5 CRCL 36 (SUTURE) IMPLANT
SUT SILK  1 MH (SUTURE) ×1
SUT SILK 1 MH (SUTURE) IMPLANT
SUT SILK 2 0 SH CR/8 (SUTURE) ×3 IMPLANT
SUT STEEL 6MS V (SUTURE) ×3 IMPLANT
SUT STEEL SZ 6 DBL 3X14 BALL (SUTURE) ×1 IMPLANT
SUT VIC AB 1 CTX 27 (SUTURE) ×6 IMPLANT
SUT VIC AB 2-0 CTX 36 (SUTURE) ×6 IMPLANT
SUT VIC AB 3-0 SH 27 (SUTURE) ×3
SUT VIC AB 3-0 SH 27X BRD (SUTURE) IMPLANT
SUT VIC AB 3-0 X1 27 (SUTURE) ×6 IMPLANT
SYSTEM SAHARA CHEST DRAIN RE-I (WOUND CARE) ×3 IMPLANT
TAPE CLOTH SURG 4X10 WHT LF (GAUZE/BANDAGES/DRESSINGS) ×1 IMPLANT
TOWEL GREEN STERILE (TOWEL DISPOSABLE) ×3 IMPLANT
TOWEL GREEN STERILE FF (TOWEL DISPOSABLE) ×3 IMPLANT
TRAY FOLEY W/METER SILVER 14FR (SET/KITS/TRAYS/PACK) ×3 IMPLANT
WATER STERILE IRR 1000ML POUR (IV SOLUTION) ×3 IMPLANT

## 2017-11-24 NOTE — Progress Notes (Deleted)
Patient ID: [name]                 DOB: 1956-01-17                      MRN: [id number]     HPI: [name] is a 62 y.o. female patient of Dr. Meda Coffee, referred by [name], PA  who presents today for hypertension evaluation. 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 At her visit with [name], PA it was noted to avoid increasing HCTZ, spironolactone and HR was in 50s (thus beta blocker not ideal) and she was started on amlodipine 5mg  daily.   She presents today for BP management.    Current HTN meds:  Amlodipine 5mg  daily hydrochlorothiazide 25mg  daily, lisinopril 40mg  daily, spironolactone 25mg  daily   Previously tried: none  BP goal: <130/80  Family History: Heart attack in her mother; Hypertension in her father.  Social History: denies tobacco and alcohol  Diet:   Exercise:   Home BP readings:   Wt Readings from Last 3 Encounters:  11/24/17 196 lb (88.9 kg)  11/13/17 196 lb (88.9 kg)  10/27/17 196 lb (88.9 kg)   BP Readings from Last 3 Encounters:  11/24/17 (!) 193/93  11/20/17 (!) 173/84  11/13/17 (!) 164/84   Pulse Readings from Last 3 Encounters:  11/24/17 73  11/20/17 69  11/13/17 73    Renal function: Estimated Creatinine Clearance: 81.3 mL/min (by C-G formula based on SCr of 0.75 mg/dL).  Past Medical History:  Diagnosis Date  . Cancer ([place]) 08/03/2010   colon  . 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 HLD (hyperlipidemia)   . Hypertension   . Uterine fibroid     Current Facility-Administered Medications on File Prior to Visit  Medication Dose Route Frequency Provider Last Rate Last Dose  . 0.9 % irrigation (POUR BTL)    PRN [name], MD   2,000 mL at 11/24/17 0711  . ceFAZolin (ANCEF) IVPB 2g/100 mL premix  2 g Intravenous 30 min Pre-Op [name], MD      .  Surgifoam 1 Gm with 0.9% sodium chloride (4 ml) topical solution    PRN [name], MD       Current Outpatient Medications on File Prior to Visit  Medication Sig Dispense Refill  . amLODipine (NORVASC) 5 MG tablet Take 1 tablet (5 mg total) by mouth daily. 30 tablet 11  . gabapentin (NEURONTIN) 300 MG capsule Take 1 capsule (300 mg total) 3 (three) times daily by mouth. 90 capsule 3  . hydrochlorothiazide (HYDRODIURIL) 25 MG tablet TAKE ONE TABLET BY MOUTH ONE TIME DAILY 90 tablet 3  . lisinopril (PRINIVIL,ZESTRIL) 40 MG tablet TAKE 1 TABLET (40 MG TOTAL) BY MOUTH DAILY. 90 tablet 1  . lovastatin (MEVACOR) 20 MG tablet Take 1 tablet (20 mg total) by mouth at bedtime. 90 tablet 3  . metoprolol tartrate (LOPRESSOR) 50 MG tablet Take 1 tablet (50 mg total) by mouth once for 1 dose. (Patient not taking: Reported on 11/20/2017) 1 tablet 0  . rivaroxaban (XARELTO) 20 MG TABS tablet Take 20 mg by mouth daily with supper.    Marland Kitchen spironolactone (ALDACTONE) 25 MG tablet Take 1 tablet (25 mg total) by mouth daily. 90 tablet 3    No Known Allergies  There were no vitals taken for this  visit.   Assessment/Plan: Hypertension:    Thank you, Lelan Pons. Patterson Hammersmith, [facility]  11/24/2017 7:44 AM

## 2017-11-24 NOTE — Anesthesia Procedure Notes (Addendum)
Arterial Line Insertion Start/End4/15/2019 7:10 AM, 11/24/2017 7:12 AM Performed by: Valda Favia, CRNA, CRNA  Patient location: Pre-op. Preanesthetic checklist: patient identified, IV checked, site marked, risks and benefits discussed, surgical consent, monitors and equipment checked, pre-op evaluation, timeout performed and anesthesia consent Lidocaine 1% used for infiltration and patient sedated Left, radial was placed Catheter size: 20 G Hand hygiene performed  and maximum sterile barriers used  Allen's test indicative of satisfactory collateral circulation Attempts: 2 (1 attempt by M. Estevan Ryder) Procedure performed without using ultrasound guided technique. Following insertion, Biopatch and dressing applied. Post procedure assessment: normal  Patient tolerated the procedure well with no immediate complications.

## 2017-11-24 NOTE — Progress Notes (Signed)
Pharmacy paged for med rec to be completed

## 2017-11-24 NOTE — Anesthesia Procedure Notes (Signed)
Central Venous Catheter Insertion Performed by: Roderic Palau, MD, anesthesiologist Start/End4/15/2019 6:52 AM, 11/24/2017 7:02 AM Patient location: Pre-op. Preanesthetic checklist: patient identified, IV checked, site marked, risks and benefits discussed, surgical consent, monitors and equipment checked, pre-op evaluation, timeout performed and anesthesia consent Position: Trendelenburg Lidocaine 1% used for infiltration and patient sedated Hand hygiene performed , maximum sterile barriers used  and Seldinger technique used Catheter size: 8 Fr Total catheter length 16. Central line was placed.Double lumen Procedure performed using ultrasound guided technique. Ultrasound Notes:anatomy identified, needle tip was noted to be adjacent to the nerve/plexus identified, no ultrasound evidence of intravascular and/or intraneural injection and image(s) printed for medical record Attempts: 1 Following insertion, dressing applied, line sutured and Biopatch. Post procedure assessment: blood return through all ports  Patient tolerated the procedure well with no immediate complications.

## 2017-11-24 NOTE — Progress Notes (Signed)
PCXR complete- pharmacy called for fentanyl PCA

## 2017-11-24 NOTE — Anesthesia Procedure Notes (Signed)
Procedure Name: Intubation Date/Time: 11/24/2017 7:51 AM Performed by: Roderic Palau, MD Pre-anesthesia Checklist: Patient identified, Emergency Drugs available, Suction available and Patient being monitored Patient Re-evaluated:Patient Re-evaluated prior to induction Oxygen Delivery Method: Circle System Utilized Preoxygenation: Pre-oxygenation with 100% oxygen Induction Type: IV induction Ventilation: Mask ventilation without difficulty Laryngoscope Size: Mac and 4 Grade View: Grade I Tube type: Oral Tube size: 8.0 mm Number of attempts: 1 Airway Equipment and Method: Stylet and Oral airway Placement Confirmation: ETT inserted through vocal cords under direct vision,  positive ETCO2 and breath sounds checked- equal and bilateral Secured at: 22 cm Tube secured with: Tape Dental Injury: Teeth and Oropharynx as per pre-operative assessment  Comments: Intubated by Vickii Penna, SRNA

## 2017-11-24 NOTE — Interval H&P Note (Signed)
History and Physical Interval Note:  4/56/2563 8:93 AM  [name]  has presented today for surgery, with the diagnosis of ANTERIOR MEDIASTINAL MASS  The various methods of treatment have been discussed with the patient and family. After consideration of risks, benefits and other options for treatment, the patient has consented to  Procedure(s): PARTIAL STERNOTOMY (N/A) RESECTION OF ANTERIOR MEDIASTINAL MASS (N/A) as a surgical intervention .  The patient's history has been reviewed, patient examined, no change in status, stable for surgery.  I have reviewed the patient's chart and labs.  Questions were answered to the patient's satisfaction.     [name]

## 2017-11-24 NOTE — Transfer of Care (Signed)
Immediate Anesthesia Transfer of Care Note  Patient: [name]  Procedure(s) Performed: PARTIAL STERNOTOMY (N/A ) THYMECTOMY  Patient Location: PACU  Anesthesia Type:General  Level of Consciousness: awake, alert  and oriented  Airway & Oxygen Therapy: Patient Spontanous Breathing and Patient connected to nasal cannula oxygen  Post-op Assessment: Report given to RN and Post -op Vital signs reviewed and stable  Post vital signs: Reviewed and stable  Last Vitals:  Vitals Value Taken Time  BP 151/82 11/24/2017 10:47 AM  Temp    Pulse 80 11/24/2017 10:52 AM  Resp 16 11/24/2017 10:52 AM  SpO2 96 % 11/24/2017 10:52 AM  Vitals shown include unvalidated device data.  Last Pain:  Vitals:   11/24/17 1032  TempSrc:   PainSc: 3       Patients Stated Pain Goal: 4 (30/05/11 0211)  Complications: No apparent anesthesia complications

## 2017-11-24 NOTE — Brief Op Note (Addendum)
11/24/2017  10:13 AM  PATIENT:  Blima Petrosian  62 y.o. female  PRE-OPERATIVE DIAGNOSIS:  ANTERIOR MEDIASTINAL MASS  POST-OPERATIVE DIAGNOSIS:  ANTERIOR MEDIASTINAL MASS  PROCEDURE:  Procedure(s): PARTIAL STERNOTOMY (N/A) THYMECTOMY  SURGEON:  Surgeon(s) and Role:    * [name], MD - Primary  PHYSICIAN ASSISTANT: WAYNE GOLD PA-C  ANESTHESIA:   GENERAL  EBL:  100 mL   BLOOD ADMINISTERED:none  DRAINS: (1 19 f) Blake drain(s) in the MEDIASTINUM   LOCAL MEDICATIONS USED:  NONE  SPECIMEN:  Source of Specimen:  MEDIASTINAL MASS  DISPOSITION OF SPECIMEN:  PATHOLOGY  COUNTS:  YES  TOURNIQUET:  * No tourniquets in log *  DICTATION: .Other Dictation: Dictation Number [id number]  PLAN OF CARE: Admit to inpatient   PATIENT DISPOSITION:  PACU - hemodynamically stable.   Delay start of Pharmacological VTE agent (>24hrs) due to surgical blood loss or risk of bleeding: yes  COMPLICATIONS: NO KNOWN

## 2017-11-24 NOTE — Op Note (Signed)
[name], [name]           ACCOUNT NO.:  [id number]  MEDICAL RECORD NO.:  [id number]  LOCATION:                                 FACILITY:  PHYSICIAN:  Revonda Standard. Roxan Hockey, M.D.DATE OF BIRTH:  1956-03-03  DATE OF PROCEDURE:  11/24/2017 DATE OF DISCHARGE:                              OPERATIVE REPORT   PREOPERATIVE DIAGNOSIS:  Anterior mediastinal mass.  POSTOPERATIVE DIAGNOSIS:  Anterior mediastinal mass.  PROCEDURE:  Partial sternotomy. Thymectomy.  SURGEONS:  Revonda Standard. Roxan Hockey, M.D.  ASSISTANT:  John Giovanni, P.A.-C.  ANESTHESIA:  General.  FINDINGS:  A 2.5 x 3.5 cm mass in the mediastinum just to the right of midline associated with thymic tissue.  No invasion of surrounding structures.  CLINICAL NOTE:  [name] is a 62 year old woman, originally from [place].  She presented with left-sided chest pain radiating to her neck.  It was atypical.  A CT of the chest was done to rule out pulmonary embolus.  There was no pulmonary embolus, but there was a 2.5 x 3.5 cm anterior mediastinal mass.  On PET-CT, the anterior mediastinal mass was hypermetabolic.  After undergoing a preoperative cardiac workup, she was cleared for surgery and now presents for resection of the anterior mediastinal mass.  The indications, risks, benefits, and alternatives were discussed in detail with the patient.  She understood and accepted the risks and agreed to proceed.  OPERATIVE NOTE:  [name] was brought to the preoperative holding area on November 24, 2017.  Anesthesia placed an arterial blood pressure monitoring line and established intravenous access.  She was taken to the operating room, anesthetized, and intubated.  Sequential compression devices were placed on the calves for DVT prophylaxis.  Foley catheter was placed.  The chest and abdomen were prepped and draped in usual sterile fashion.  After performing a time-out, a partial sternotomy was performed.   This was an "L" sternotomy through the 4th intercostal space on the right.  A retractor was placed and gently opened over time.  The right pleural space had been opened with the sternotomy.  The mass was clearly visible just to the right of the midline.  This appeared to be a part of the thymus and thymectomy was performed.  The lower poles were mobilized first.  They were taken off the pericardium with electrocautery.  There was no area of invasion of the pericardium.  On the right side, the phrenic nerve was identified and the pleura was scored well above this keeping at least a 1-cm margin from the phrenic nerve.  Larger blood vessels were clipped on both sides and divided with scissors.  The upper poles of the thymus then were mobilized.  Large feeding vessels and these areas were clipped and divided.  The fatty tissue was taken completely off the innominate artery and the innominate vein.  There was a large feeding vein from the innominate vein to the thymus, which was doubly clipped and divided.  On the left side, the anterior mediastinal fatty thymus tissue had nodularity to it.  The left phrenic nerve was identified and again the dissection was carried out anterior to it and no cautery was used in the vicinity of the  phrenic nerve on either side. There were multiple nodes on the left side, which were included with the specimen en bloc.  One small mediastinal lymph node was sent as a separate specimen for permanent pathology.  The thymectomy was completed.  The specimen was examined.  It was sent for frozen section, which returned indeterminate.  A final inspection was made for hemostasis.  The chest was copiously irrigated with warm saline.  A 19- [place] Blake drain was passed from the right subcostal region through the right pleural space across the mediastinum and a small opening was made in the left pleura and the tip of the drain was placed into that. It was secured to skin  with #1 silk suture.  The sternotomy was closed with a combination of single and double heavy gauge stainless steel wires.  The pectoralis fascia, subcutaneous tissue, and skin were closed in a standard fashion.  All sponge, needle, and instrument counts were correct at the end of the procedure.  The patient was extubated in the operating room and taken to the postanesthetic care unit in good condition.     Revonda Standard Roxan Hockey, M.D.     SCH/MEDQ  D:  11/24/2017  T:  11/24/2017  Job:  [id number]

## 2017-11-24 NOTE — Anesthesia Postprocedure Evaluation (Signed)
Anesthesia Post Note  Patient: [name]  Procedure(s) Performed: PARTIAL STERNOTOMY (N/A ) THYMECTOMY     Patient location during evaluation: PACU Anesthesia Type: General Level of consciousness: awake and alert Pain management: pain level controlled Vital Signs Assessment: post-procedure vital signs reviewed and stable Respiratory status: spontaneous breathing, nonlabored ventilation, respiratory function stable and patient connected to nasal cannula oxygen Cardiovascular status: blood pressure returned to baseline and stable Postop Assessment: no apparent nausea or vomiting Anesthetic complications: no    Last Vitals:  Vitals:   11/24/17 1458 11/24/17 1530  BP: (!) 147/78   Pulse: 79 75  Resp: 12 11  Temp:    SpO2: 96% 96%    Last Pain:  Vitals:   11/24/17 1530  TempSrc:   PainSc: Tyler Deis

## 2017-11-25 ENCOUNTER — Other Ambulatory Visit: Payer: Self-pay

## 2017-11-25 ENCOUNTER — Encounter (HOSPITAL_COMMUNITY): Payer: Self-pay | Admitting: Thoracic Surgery (Cardiothoracic Vascular Surgery)

## 2017-11-25 ENCOUNTER — Inpatient Hospital Stay (HOSPITAL_COMMUNITY): Payer: 59

## 2017-11-25 LAB — BASIC METABOLIC PANEL
Anion gap: 9 (ref 5–15)
BUN: 8 mg/dL (ref 6–20)
CHLORIDE: 104 mmol/L (ref 101–111)
CO2: 24 mmol/L (ref 22–32)
Calcium: 8.7 mg/dL — ABNORMAL LOW (ref 8.9–10.3)
Creatinine, Ser: 0.83 mg/dL (ref 0.44–1.00)
GFR calc Af Amer: >60 mL/min (ref >60)
GFR calc non Af Amer: >60 mL/min (ref >60)
GLUCOSE: 153 mg/dL — AB (ref 65–99)
POTASSIUM: 3.5 mmol/L (ref 3.5–5.1)
Sodium: 137 mmol/L (ref 135–145)

## 2017-11-25 LAB — BLOOD GAS, ARTERIAL
ACID-BASE DEFICIT: 0.7 mmol/L (ref 0.0–2.0)
BICARBONATE: 23.6 mmol/L (ref 20.0–28.0)
Drawn by: 518311
FIO2: 0.21
O2 Saturation: 94.5 %
PCO2 ART: 39.3 mmHg (ref 32.0–48.0)
PH ART: 7.395 (ref 7.350–7.450)
PO2 ART: 68.1 mmHg — AB (ref 83.0–108.0)
Patient temperature: 98.6

## 2017-11-25 LAB — CBC
HEMATOCRIT: 37.1 % (ref 36.0–46.0)
HEMOGLOBIN: 12.4 g/dL (ref 12.0–15.0)
MCH: 30.5 pg (ref 26.0–34.0)
MCHC: 33.4 g/dL (ref 30.0–36.0)
MCV: 91.4 fL (ref 78.0–100.0)
Platelets: 187 10*3/uL (ref 150–400)
RBC: 4.06 MIL/uL (ref 3.87–5.11)
RDW: 12.9 % (ref 11.5–15.5)
WBC: 12.3 10*3/uL — ABNORMAL HIGH (ref 4.0–10.5)

## 2017-11-25 MED ORDER — ENOXAPARIN SODIUM 40 MG/0.4ML ~~LOC~~ SOLN
40.0000 mg | Freq: Every day | SUBCUTANEOUS | Status: AC
  Administered 2017-11-25: 40 mg via SUBCUTANEOUS
  Filled 2017-11-25: qty 0.4

## 2017-11-25 MED ORDER — METOPROLOL TARTRATE 50 MG PO TABS
50.0000 mg | ORAL_TABLET | Freq: Once | ORAL | Status: AC
  Administered 2017-11-25: 50 mg via ORAL
  Filled 2017-11-25: qty 1

## 2017-11-25 MED ORDER — RIVAROXABAN 20 MG PO TABS
20.0000 mg | ORAL_TABLET | Freq: Every day | ORAL | Status: DC
  Administered 2017-11-26: 20 mg via ORAL
  Filled 2017-11-25: qty 1

## 2017-11-25 NOTE — Progress Notes (Signed)
Pt ambulated approx 80 ft in hallway, stated she felt tired from the pain meds and wanted to return to bed. Pt not in any distress while ambulating, returned to bed as requested.

## 2017-11-25 NOTE — Progress Notes (Signed)
Prior to removal of JP drain placed in chest, educated pt on procedure, return demonstration appropriate, pt voiced understanding and practiced taking a deep breath and holding x2, during removal - pt moaned out in pain. No complications post removal, will continue to  Monitor.

## 2017-11-25 NOTE — Progress Notes (Signed)
1 Day Post-Op Procedure(s) (LRB): PARTIAL STERNOTOMY (N/A) THYMECTOMY Subjective: Some incisional pain, not as bad as yesterday  Objective: Vital signs in last 24 hours: Temp:  [97.2 F (36.2 C)-99.4 F (37.4 C)] 99.4 F (37.4 C) (04/16 0342) Pulse Rate:  [69-93] 87 (04/16 0342) Cardiac Rhythm: Normal sinus rhythm (04/16 0039) Resp:  [11-20] 18 (04/16 0408) BP: (139-164)/(70-92) 159/70 (04/16 0342) SpO2:  [92 %-97 %] 94 % (04/16 0408) Arterial Line BP: (136-197)/(62-82) 197/82 (04/15 1949)  Hemodynamic parameters for last 24 hours:    Intake/Output from previous day: 04/15 0701 - 04/16 0700 In: 2760 [P.O.:560; I.V.:1800] Out: 3565 [Urine:3325; Drains:140; Blood:100] Intake/Output this shift: No intake/output data recorded.  General appearance: alert, cooperative and no distress Neurologic: intact Heart: regular rate and rhythm Lungs: clear to auscultation bilaterally minimal serosanguinous drainage from tube  Lab Results: Recent Labs    11/25/17 0405  WBC 12.3*  HGB 12.4  HCT 37.1  PLT 187   BMET:  Recent Labs    11/25/17 0405  NA 137  K 3.5  CL 104  CO2 24  GLUCOSE 153*  BUN 8  CREATININE 0.83  CALCIUM 8.7*    PT/INR: No results for input(s): LABPROT, INR in the last 72 hours. ABG    Component Value Date/Time   PHART 7.395 11/25/2017 0548   HCO3 23.6 11/25/2017 0548   ACIDBASEDEF 0.7 11/25/2017 0548   O2SAT 94.5 11/25/2017 0548   CBG (last 3)  No results for input(s): GLUCAP in the last 72 hours.  Assessment/Plan: S/P Procedure(s) (LRB): PARTIAL STERNOTOMY (N/A) THYMECTOMY -Doing well POD # 1 PCA for pain control Hypertension- resume home meds Will cover with SQ enoxaparin today, resume Xarelto tomorrow Ambulate  Advance diet IV to KVO DC A line   LOS: 1 day    [name] 11/25/2017

## 2017-11-26 ENCOUNTER — Inpatient Hospital Stay (HOSPITAL_COMMUNITY): Payer: 59

## 2017-11-26 LAB — BLOOD GAS, ARTERIAL

## 2017-11-26 LAB — COMPREHENSIVE METABOLIC PANEL
ALK PHOS: 106 U/L (ref 38–126)
ALT: 193 U/L — AB (ref 14–54)
AST: 112 U/L — AB (ref 15–41)
Albumin: 3.5 g/dL (ref 3.5–5.0)
Anion gap: 6 (ref 5–15)
BUN: 12 mg/dL (ref 6–20)
CO2: 28 mmol/L (ref 22–32)
CREATININE: 0.94 mg/dL (ref 0.44–1.00)
Calcium: 8.8 mg/dL — ABNORMAL LOW (ref 8.9–10.3)
Chloride: 104 mmol/L (ref 101–111)
GFR calc Af Amer: >60 mL/min (ref >60)
Glucose, Bld: 128 mg/dL — ABNORMAL HIGH (ref 65–99)
Potassium: 3 mmol/L — ABNORMAL LOW (ref 3.5–5.1)
Sodium: 138 mmol/L (ref 135–145)
Total Bilirubin: 1.1 mg/dL (ref 0.3–1.2)
Total Protein: 6.2 g/dL — ABNORMAL LOW (ref 6.5–8.1)

## 2017-11-26 LAB — CBC
HEMATOCRIT: 38.4 % (ref 36.0–46.0)
HEMOGLOBIN: 12.8 g/dL (ref 12.0–15.0)
MCH: 31 pg (ref 26.0–34.0)
MCHC: 33.3 g/dL (ref 30.0–36.0)
MCV: 93 fL (ref 78.0–100.0)
PLATELETS: 166 10*3/uL (ref 150–400)
RBC: 4.13 MIL/uL (ref 3.87–5.11)
RDW: 13.1 % (ref 11.5–15.5)
WBC: 7.8 10*3/uL (ref 4.0–10.5)

## 2017-11-26 MED ORDER — POTASSIUM CHLORIDE 10 MEQ/50ML IV SOLN
10.0000 meq | INTRAVENOUS | Status: AC
  Administered 2017-11-26 (×3): 10 meq via INTRAVENOUS
  Filled 2017-11-26 (×3): qty 50

## 2017-11-26 MED ORDER — FENTANYL 40 MCG/ML IV SOLN
INTRAVENOUS | Status: AC
  Administered 2017-11-26: 0 ug via INTRAVENOUS
  Filled 2017-11-26: qty 25

## 2017-11-26 NOTE — Progress Notes (Signed)
PCA pump discontinued. 20 mL of fentanyl wasted in sink with charge nurse Tammy RN

## 2017-11-26 NOTE — Progress Notes (Signed)
[address]       [place] [address]             [phone]      2 Days Post-Op Procedure(s) (LRB): PARTIAL STERNOTOMY (N/A) THYMECTOMY Subjective: Feels well, minimal pain  Objective: Vital signs in last 24 hours: Temp:  [98.9 F (37.2 C)-99.9 F (37.7 C)] 99 F (37.2 C) (04/17 0700) Cardiac Rhythm: Normal sinus rhythm (04/17 0700) Resp:  [18-24] 21 (04/17 0401) BP: (108-141)/(62-89) 139/87 (04/17 0700) SpO2:  [90 %-96 %] 94 % (04/17 0800)  Hemodynamic parameters for last 24 hours:    Intake/Output from previous day: 04/16 0701 - 04/17 0700 In: 2437.2 [P.O.:960; I.V.:1477.2] Out: 3150 [Urine:3150] Intake/Output this shift: No intake/output data recorded.  General appearance: alert, cooperative and no distress Heart: regular rate and rhythm Lungs: clear to auscultation bilaterally Abdomen: benign Extremities: PAS in place, no calf tenderness Wound: dressing CDI  Lab Results: Recent Labs    11/25/17 0405 11/26/17 0330  WBC 12.3* 7.8  HGB 12.4 12.8  HCT 37.1 38.4  PLT 187 166   BMET:  Recent Labs    11/25/17 0405 11/26/17 0330  NA 137 138  K 3.5 3.0*  CL 104 104  CO2 24 28  GLUCOSE 153* 128*  BUN 8 12  CREATININE 0.83 0.94  CALCIUM 8.7* 8.8*    PT/INR: No results for input(s): LABPROT, INR in the last 72 hours. ABG    Component Value Date/Time   PHART 7.395 11/25/2017 0548   HCO3 23.6 11/25/2017 0548   ACIDBASEDEF 0.7 11/25/2017 0548   O2SAT 94.5 11/25/2017 0548   CBG (last 3)  No results for input(s): GLUCAP in the last 72 hours.  Meds Scheduled Meds: . acetaminophen  1,000 mg Oral Q6H   Or  . acetaminophen (TYLENOL) oral liquid 160 mg/5 mL  1,000 mg Oral Q6H  . amLODipine  5 mg Oral Daily  . bisacodyl  10 mg Oral Daily  . fentaNYL   Intravenous Q4H  . gabapentin  300 mg Oral TID  . hydrochlorothiazide  25 mg Oral Daily  . lisinopril  40 mg Oral Daily  . pantoprazole  40 mg Oral Daily  . rivaroxaban  20  mg Oral Q supper  . senna-docusate  1 tablet Oral QHS  . spironolactone  25 mg Oral Daily   Continuous Infusions: . dextrose 5 % and 0.9% NaCl 10 mL/hr at 11/25/17 0832  . potassium chloride 10 mEq (11/26/17 0556)  . potassium chloride     PRN Meds:.albuterol, diphenhydrAMINE **OR** diphenhydrAMINE, naloxone **AND** sodium chloride flush, ondansetron (ZOFRAN) IV, oxyCODONE, potassium chloride  Xrays Dg Chest Port 1 View  Result Date: 11/26/2017 CLINICAL DATA:  Post mediasternectomy for thymectomy EXAM: PORTABLE CHEST 1 VIEW COMPARISON:  Portable chest x-ray of 11/25/2017 FINDINGS: There is mild bibasilar atelectasis present and small pleural effusions are difficult to exclude. Right IJ central venous line tip overlies the mid SVC. Median sternotomy sutures are noted. Mediastinal and hilar contours are unremarkable and the heart is unchanged in size being within normal limits. No bony abnormality is noted. IMPRESSION: 1. Bibasilar atelectasis. 2. Cannot exclude small pleural effusions. 3. Right IJ central venous line tip overlies the SVC. Electronically Signed   By: Ivar Drape M.D.   On: 11/26/2017 08:51   Dg Chest Port 1 View  Result Date: 11/25/2017 CLINICAL DATA:  Post thymectomy.  Chest tube. EXAM: PORTABLE CHEST 1 VIEW COMPARISON:  11/24/2017 FINDINGS: Left chest  tube noted. No pneumothorax. Prior median sternotomy. Heart is normal size. Bibasilar atelectasis. IMPRESSION: Left chest tube without pneumothorax. Bibasilar atelectasis. Electronically Signed   By: Rolm Baptise M.D.   On: 11/25/2017 07:42   Dg Chest Port 1 View  Result Date: 11/24/2017 CLINICAL DATA:  Central catheter placement EXAM: PORTABLE CHEST 1 VIEW COMPARISON:  Study obtained earlier in the day FINDINGS: Patient is status post median sternotomy. Central catheter tip is in the superior vena cava. There is a chest tube with tip overlying the lateral upper left hemithorax. No pneumothorax. There is no evident edema or  consolidation. Heart size and pulmonary vascularity are normal. No adenopathy. There is aortic atherosclerosis. No evident bone lesions. IMPRESSION: Tube and catheter positions as described without pneumothorax. No edema or consolidation. Heart size normal. There is aortic atherosclerosis. Aortic Atherosclerosis (ICD10-I70.0). Electronically Signed   By: Lowella Grip III M.D.   On: 11/24/2017 11:01    Assessment/Plan: S/P Procedure(s) (LRB): PARTIAL STERNOTOMY (N/A) THYMECTOMY  1 doing well, BP elevated at times, back on home meds- follow for now, RA sats ok , CXR stabe 2 cont pulm toilet/rehab 3 K+ replacement 4 D/C central line and foley 5 d/c PCA 6 H/H stable, no leukocytosis   LOS: 2 days    John Giovanni 11/26/2017

## 2017-11-26 NOTE — Discharge Summary (Addendum)
Physician Discharge Summary  Patient ID: [name] MRN: [id number] DOB/AGE: 1956-07-12 62 y.o.  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 Bilateral leg pain 07/02/2016  . History of DVT of lower extremity 05/31/2016  . History of colon cancer, stage I 08/17/2015  . Varicose 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 08/26/2014  . HTN (hypertension) 12/24/2013  . Cancer of left colon ([place]) 02/25/2012  . DVT (deep venous thrombosis) ([place]) 08/27/2011   HPI: [name] returns to discuss resection of her anterior mediastinal mass.  She was referred to Dr. Roxan Hockey for surgical opinion.  [name] is a 62 year old woman from [place] originally.  She has a past medical history significant for hypertension, hyperlipidemia, colon cancer entheses 2011), 2 episodes of DVT, and fibroids.  She recently was having left-sided chest pain that would at times radiate to her neck.  It was a very atypical.  Workup included a CT of the chest to rule out pulmonary embolus given her history of DVT.  There was no PE, but there was a 2.5 x 2.6 x 3.4 cm anterior mediastinal mass noted.  A PET/CT showed the anterior mediastinal mass was hypermetabolic.  She saw Dr. Ena Dawley of cardiology.  A cardiac CT showed a calcium score of 0.  Denies any blurred or double vision or unusual weakness.  PATHOLOGY:  Diagnosis 1. Thymus, thymectomy - THYMOMA, TYPE A/B, 3.6 CM - NO MALIGNANCY IDENTIFIED IN ELEVEN LYMPH NODES (0/11) - SEE ONCOLOGY TABLE AND COMMENT BELOW 2. Lymph node, biopsy, Mediastinal - ONE REACTIVE LYMPH NODE - NO MALIGNANCY IDENTIFIED Microscopic Comment 1. THYMUS Specimen: Thymus Procedure: Thymectomy Maximum tumor size (cm): 3.6 cm Histologic type: Type AB  thymoma Margins: Uninvolved by tumor Transcapsular invasion: Not identified Tumor extension: Tumor confined to the thymus Treatment effect: No known presurgical therapy Lymphovascular invasion: Not identified Lymph nodes: number examined 12 ; number positive 0 Pathologic Staging for Thymomas (modified Masaoka Stage): Stage I Pathologic Staging for Thymic Carcinomas (TNM): pT1a, pN0 Comments: The neoplasm is composed of spindled and epithelioid cells with admixed small lymphocytes (CD5, CD20, ki-67 and Cytokeratin AE1/3). The epithelioid cells do not express CD5. The neoplasm invades into but not through the capsule. Dr. Tresa Moore reviewed the case and agrees with the above diagnosis. Thressa Sheller MD Pathologist, Electronic Signature (Case signed 11/26/2017) 1 of 2 FINAL for Hocker, Damyiah ([id number]) Intraoperative Diagnosis 1. RAPID INTRAOPERATIVE CONSULTATION: THYMECTOMY: LESIONAL TISSUE. QUESTIONABLE LYMPHOID LESION. (JBK) Specimen Gross and Clinical Information Specimen(s) Obtained: 1. Thymus, thymectomy 2. Lymph node, biopsy, Mediastinal Specimen Clinical Information 1. Anterior mediastinal mass (nt) Gross 1. The specimen is received fresh for rapid intraoperative consultation and consists of a 12.2 x 11.0 x 1.6 cm piece of tan-yellow lobulated soft tissue, clinically listed as thymus. At one end of the specimen, there is a 3.6 x 3.0 x 2.5 cm tan-pink, firm, well defined nodule identified. The outer surface of the nodule is inked black, and the nodule is serially sectioned to reveal tan-pink solid cut surface. Representative section is submitted for frozen section analysis, and the additional portion of fresh tissue is submitted in RPMI for possible flow cytometry. Sectioning through the remainder of the specimen reveals tan-yellow lobulated soft tissue with ten tan-pink to gray possible lymph nodes identified ranging from 0.5 cm to 2.1 x  1.3 x 0.9 cm. Representative sections  are submitted in thirteen cassettes. A = frozen section remnant B-F = additional sections of nodular lesion G = lobulated soft tissue H = four possible lymph nodes I = three possible lymph nodes J-M = one possible lymph node bisected, each. 2. The specimen is received in saline and consists of a 0.4 cm piece of tan-pink soft tissue. The specimen is entirely submitted in one cassette. (KL:ah 11/24/17) Stain(s) used in Diagnosis: The following stain(s) were used in diagnosing the case: CD 20, KI-67-NO ACIS, CD 5, CK AE1AE3. The control(s) stained appropriately. Disclaimer Some of these immunohistochemical stains may have been developed and the performance characteristics determined by [facility]. Some may not have been cleared or approved by the U.S. Food and Drug Administration. The FDA has determined that such clearance or approval is not necessary. This test is used for clinical purposes. It should not be regarded as investigational or for research. This laboratory is certified under the [place] (CLIA-88) as qualified to perform high complexity clinical laboratory testing. Report signed out from the following location(s) Technical Component was performed at [facility] [place], [place], [place] [address]. CLIA #: [id number], Technical component and interpretation was performed at [facility] [place], [place], [place] [address]. CLIA #: [id number],   Discharged Condition: good  Hospital Course: The patient was admitted electively and on 11/24/2017 taken to the operating room where she underwent the below described procedure.  She tolerated it well and was taken to the postanesthesia care unit in stable condition.  She was doing well postop day 1.  She was hypertensive therefore resumed her home medications.  We did start Lovenox for DVT prophylaxis.  We advanced her diet as tolerated.  We  discontinued her arterial line.  Postop day 2 She was having minimal pain.  We continue to encourage the use of incentive spirometry.  She was on room air at this point with good oxygen saturation.  We discontinued her central line and Foley catheter.  We discontinued her PCA pump. Overall the patient has progressed nicely.  She was ambulating with limited assistance, tolerating room air, her incisions were healing well, and she was ready for discharge home.  Consults: None  Significant Diagnostic Studies: routine post-op CXR's, Labs  Treatments: surgery:   PHYSICIAN:  Remo Lipps C. Roxan Hockey, M.D.DATE OF BIRTH:  [birth year]  DATE OF PROCEDURE:  11/24/2017 DATE OF DISCHARGE:                              OPERATIVE REPORT   PREOPERATIVE DIAGNOSIS:  Anterior mediastinal mass.  POSTOPERATIVE DIAGNOSIS:  Anterior mediastinal mass.  PROCEDURE:  Partial sternotomy, thymectomy.  SURGEONS:  Revonda Standard. Roxan Hockey, M.D.  ASSISTANT:  John Giovanni, P.A.-C.  ANESTHESIA:  General.  FINDINGS:  A 2.5 x 3.5 cm mass in the mediastinum just to the right of midline associated with thymic tissue.  No invasion of surrounding structures.    Discharge Exam: Blood pressure 115/69, pulse (!) 106, temperature 99.5 F (37.5 C), temperature source Oral, resp. rate 14, height _0  (1.702 m), weight 88.9 kg (196 lb), SpO2 94 %.    General appearance: alert, cooperative and no distress Heart: regular rate and rhythm Lungs: mild basilar crackles Abdomen: soft, non-tender; bowel sounds normal; no masses,  no organomegaly Extremities: no calf tenderness Wound: incis healing well  Disposition: Discharge disposition: 01-Home or Self Care     discharged home  Discharge Instructions    Discharge patient   Complete by:  As directed    Discharge disposition:  01-Home or Self Care   Discharge patient date:  11/27/2017     Allergies as of 11/27/2017   No Known Allergies     Medication  List    TAKE these medications   amLODipine 5 MG tablet Commonly known as:  NORVASC Take 1 tablet (5 mg total) by mouth daily.   gabapentin 300 MG capsule Commonly known as:  NEURONTIN Take 1 capsule (300 mg total) 3 (three) times daily by mouth.   hydrochlorothiazide 25 MG tablet Commonly known as:  HYDRODIURIL TAKE ONE TABLET BY MOUTH ONE TIME DAILY   lisinopril 40 MG tablet Commonly known as:  PRINIVIL,ZESTRIL TAKE 1 TABLET (40 MG TOTAL) BY MOUTH DAILY.   lovastatin 20 MG tablet Commonly known as:  MEVACOR Take 1 tablet (20 mg total) by mouth at bedtime.   metoprolol tartrate 50 MG tablet Commonly known as:  LOPRESSOR Take 1 tablet (50 mg total) by mouth once for 1 dose.   oxyCODONE 5 MG immediate release tablet Commonly known as:  Oxy IR/ROXICODONE Take 1 tablet (5 mg total) by mouth every 6 (six) hours as needed for up to 7 days for severe pain.   rivaroxaban 20 MG Tabs tablet Commonly known as:  XARELTO Take 20 mg by mouth daily with supper.   spironolactone 25 MG tablet Commonly known as:  ALDACTONE Take 1 tablet (25 mg total) by mouth daily.      Follow-up Information    [name], MD Follow up.   Specialty:  Cardiothoracic Surgery Why:  Appointment to see Dr. Roxan Hockey on 12/09/2017 at 1 PM.  Please obtain a chest x-ray at [place] at 12:30 PM.  [facility] imaging is located in the same office complex on the first floor. Contact information: [place] [address] [phone]        Darreld Mclean, MD. Call in 1 day(s).   Specialty:  Family Medicine Contact information: [place] [address] [phone]        Dorothy Spark, MD .   Specialty:  Cardiology Contact information: [place] [address] [phone]           Signed: John Giovanni 11/27/2017, 8:30 AM

## 2017-11-26 NOTE — Discharge Instructions (Signed)
What to do after you leave the hospital:  Recommended diet: regular diet  Recommended activity: no lifting, driving, or strenuous exercise for 6 weeks, no lifting more than 10 pounds and no driving for 2 weeks till seen by the surgeon  Follow-up with Dr Roxan Hockey- see discharge paperwork   Follow up with surgeon office if you experience any of the following symptoms: drainage drom wound, fever greater than 101.5  Other instructions:  Continue breathing exercises, increase walking as tolerated.

## 2017-11-27 ENCOUNTER — Encounter: Payer: Self-pay | Admitting: *Deleted

## 2017-11-27 ENCOUNTER — Other Ambulatory Visit: Payer: Self-pay | Admitting: *Deleted

## 2017-11-27 LAB — BASIC METABOLIC PANEL
Anion gap: 11 (ref 5–15)
BUN: 12 mg/dL (ref 6–20)
CHLORIDE: 101 mmol/L (ref 101–111)
CO2: 26 mmol/L (ref 22–32)
CREATININE: 0.84 mg/dL (ref 0.44–1.00)
Calcium: 9 mg/dL (ref 8.9–10.3)
GFR calc Af Amer: >60 mL/min (ref >60)
GFR calc non Af Amer: >60 mL/min (ref >60)
Glucose, Bld: 140 mg/dL — ABNORMAL HIGH (ref 65–99)
Potassium: 3.3 mmol/L — ABNORMAL LOW (ref 3.5–5.1)
SODIUM: 138 mmol/L (ref 135–145)

## 2017-11-27 MED ORDER — POTASSIUM CHLORIDE CRYS ER 20 MEQ PO TBCR
40.0000 meq | EXTENDED_RELEASE_TABLET | Freq: Once | ORAL | Status: AC
Start: 2017-11-27 — End: 2017-11-27
  Administered 2017-11-27: 40 meq via ORAL
  Filled 2017-11-27: qty 2

## 2017-11-27 MED ORDER — OXYCODONE HCL 5 MG PO TABS: 5.0000 mg | ORAL_TABLET | Freq: Four times a day (QID) | ORAL | 0 refills | Status: AC | PRN

## 2017-11-27 NOTE — Plan of Care (Signed)
Pt. Ready for discharge per MD order. All paperwork given to pt. Pt demonstrated using the incentive spirometer and verbalized understanding for discharge instructions. All personal belongings were with pt.

## 2017-11-27 NOTE — Progress Notes (Addendum)
[address]       [place] [address]             [phone]      3 Days Post-Op Procedure(s) (LRB): PARTIAL STERNOTOMY (N/A) THYMECTOMY Subjective: Feels well, ambulating without problem  Objective: Vital signs in last 24 hours: Temp:  [99.3 F (37.4 C)-99.5 F (37.5 C)] 99.5 F (37.5 C) (04/18 0340) Pulse Rate:  [106] 106 (04/17 2338) Cardiac Rhythm: Normal sinus rhythm (04/18 0700) Resp:  [12-23] 14 (04/18 0340) BP: (101-135)/(62-85) 115/69 (04/18 0340) SpO2:  [94 %-98 %] 94 % (04/18 0340)  Hemodynamic parameters for last 24 hours:    Intake/Output from previous day: 04/17 0701 - 04/18 0700 In: 600 [P.O.:600] Out: 100 [Urine:100] Intake/Output this shift: No intake/output data recorded.  General appearance: alert, cooperative and no distress Heart: regular rate and rhythm Lungs: mild basilar crackles Abdomen: soft, non-tender; bowel sounds normal; no masses,  no organomegaly Extremities: no calf tenderness Wound: incis healing well  Lab Results: Recent Labs    11/25/17 0405 11/26/17 0330  WBC 12.3* 7.8  HGB 12.4 12.8  HCT 37.1 38.4  PLT 187 166   BMET:  Recent Labs    11/26/17 0330 11/27/17 0311  NA 138 138  K 3.0* 3.3*  CL 104 101  CO2 28 26  GLUCOSE 128* 140*  BUN 12 12  CREATININE 0.94 0.84  CALCIUM 8.8* 9.0    PT/INR: No results for input(s): LABPROT, INR in the last 72 hours. ABG    Component Value Date/Time   PHART 7.395 11/25/2017 0548   HCO3 23.6 11/25/2017 0548   ACIDBASEDEF 0.7 11/25/2017 0548   O2SAT 94.5 11/25/2017 0548   CBG (last 3)  No results for input(s): GLUCAP in the last 72 hours.  Meds Scheduled Meds: . acetaminophen  1,000 mg Oral Q6H   Or  . acetaminophen (TYLENOL) oral liquid 160 mg/5 mL  1,000 mg Oral Q6H  . amLODipine  5 mg Oral Daily  . bisacodyl  10 mg Oral Daily  . gabapentin  300 mg Oral TID  . hydrochlorothiazide  25 mg Oral Daily  . lisinopril  40 mg Oral Daily  .  pantoprazole  40 mg Oral Daily  . rivaroxaban  20 mg Oral Q supper  . senna-docusate  1 tablet Oral QHS  . spironolactone  25 mg Oral Daily   Continuous Infusions: . potassium chloride Stopped (11/26/17 0656)   PRN Meds:.albuterol, diphenhydrAMINE **OR** diphenhydrAMINE, naloxone **AND** sodium chloride flush, ondansetron (ZOFRAN) IV, oxyCODONE, potassium chloride  Xrays Dg Chest Port 1 View  Result Date: 11/26/2017 CLINICAL DATA:  Post mediasternectomy for thymectomy EXAM: PORTABLE CHEST 1 VIEW COMPARISON:  Portable chest x-ray of 11/25/2017 FINDINGS: There is mild bibasilar atelectasis present and small pleural effusions are difficult to exclude. Right IJ central venous line tip overlies the mid SVC. Median sternotomy sutures are noted. Mediastinal and hilar contours are unremarkable and the heart is unchanged in size being within normal limits. No bony abnormality is noted. IMPRESSION: 1. Bibasilar atelectasis. 2. Cannot exclude small pleural effusions. 3. Right IJ central venous line tip overlies the SVC. Electronically Signed   By: Ivar Drape M.D.   On: 11/26/2017 08:51   Diagnosis 1. Thymus, thymectomy - THYMOMA, TYPE A/B, 3.6 CM - NO MALIGNANCY IDENTIFIED IN ELEVEN LYMPH NODES (0/11) - SEE ONCOLOGY TABLE AND COMMENT BELOW 2. Lymph node, biopsy, Mediastinal - ONE REACTIVE LYMPH NODE - NO MALIGNANCY IDENTIFIED Microscopic Comment  1. THYMUS Specimen: Thymus Procedure: Thymectomy Maximum tumor size (cm): 3.6 cm Histologic type: Type AB thymoma Margins: Uninvolved by tumor Transcapsular invasion: Not identified Tumor extension: Tumor confined to the thymus Treatment effect: No known presurgical therapy Lymphovascular invasion: Not identified Lymph nodes: number examined 12 ; number positive 0 Pathologic Staging for Thymomas (modified Masaoka Stage): Stage I Pathologic Staging for Thymic Carcinomas (TNM): pT1a, pN0 Comments: The neoplasm is composed of spindled and epithelioid  cells with admixed small lymphocytes (CD5, CD20, ki-67 and Cytokeratin AE1/3). The epithelioid cells do not express CD5. The neoplasm invades into but not through the capsule. Dr. Tresa Moore reviewed the case and agrees with the above diagnosis. Thressa Sheller MD Pathologist, Electronic Signature (Case signed 11/26/2017) 1 of 2 FINAL for Leclaire, Tam ([id number]) Intraoperative Diagnosis 1. RAPID INTRAOPERATIVE CONSULTATION: THYMECTOMY: LESIONAL TISSUE. QUESTIONABLE LYMPHOID LESION. (JBK) Specimen Gross and Clinical Information Specimen(s) Obtained: 1. Thymus, thymectomy 2. Lymph node, biopsy, Mediastinal Specimen Clinical Information 1. Anterior mediastinal mass (nt) Gross 1. The specimen is received fresh for rapid intraoperative consultation and consists of a 12.2 x 11.0 x 1.6 cm piece of tan-yellow lobulated soft tissue, clinically listed as thymus. At one end of the specimen, there is a 3.6 x 3.0 x 2.5 cm tan-pink, firm, well defined nodule identified. The outer surface of the nodule is inked black, and the nodule is serially sectioned to reveal tan-pink solid cut surface. Representative section is submitted for frozen section analysis, and the additional portion of fresh tissue is submitted in RPMI for possible flow cytometry. Sectioning through the remainder of the specimen reveals tan-yellow lobulated soft tissue with ten tan-pink to gray possible lymph nodes identified ranging from 0.5 cm to 2.1 x 1.3 x 0.9 cm. Representative sections are submitted in thirteen cassettes. A = frozen section remnant B-F = additional sections of nodular lesion G = lobulated soft tissue H = four possible lymph nodes I = three possible lymph nodes J-M = one possible lymph node bisected, each. 2. The specimen is received in saline and consists of a 0.4 cm piece of tan-pink soft tissue. The specimen is entirely submitted in one cassette. (KL:ah 11/24/17) Stain(s) used in Diagnosis: The following  stain(s) were used in diagnosing the case: CD 20, KI-67-NO ACIS, CD 5, CK AE1AE3. The control(s) stained appropriately. Disclaimer Some of these immunohistochemical stains may have been developed and the performance characteristics determined by [facility]. Some may not have been cleared or approved by the U.S. Food and Drug Administration. The FDA has determined that such clearance or approval is not necessary. This test is used for clinical purposes. It should not be regarded as investigational or for research. This laboratory is certified under the [place] (CLIA-88) as qualified to perform high complexity clinical laboratory testing. Report signed out from the following location(s) Technical Component was performed at [facility] [place], [place], [place] [address]. CLIA #: [id number], Technical component and interpretation was performed at [facility] [place], [place], [place] [address]. CLIA #: [id number], 2 of 2 Assessment/Plan: S/P Procedure(s) (LRB): PARTIAL STERNOTOMY (N/A) THYMECTOMY   1 hemodyn stable in sinus with some PAC's. K+ low- will replace 2 path noted above  3 low grade temp- cont to work on IS/pulm toilet/ambulation 4 stable for discharge   LOS: 3 days    John Giovanni 11/27/2017 Patient seen and examined, agree with above Dc home today  Remo Lipps C. Roxan Hockey, MD [facility] [phone]

## 2017-12-01 DIAGNOSIS — Z736 Limitation of activities due to disability: Secondary | ICD-10-CM

## 2017-12-04 ENCOUNTER — Ambulatory Visit (INDEPENDENT_AMBULATORY_CARE_PROVIDER_SITE_OTHER): Payer: Self-pay

## 2017-12-04 DIAGNOSIS — Z4802 Encounter for removal of sutures: Secondary | ICD-10-CM

## 2017-12-04 NOTE — Progress Notes (Signed)
Removed 1 suture from chest tube site, no signs of infection and patient tolerated well. 

## 2017-12-09 ENCOUNTER — Ambulatory Visit
Admission: RE | Admit: 2017-12-09 | Discharge: 2017-12-09 | Disposition: A | Discharge status: Home / Self Care | Payer: 59 | Source: Ambulatory Visit | Attending: Thoracic Surgery (Cardiothoracic Vascular Surgery) | Admitting: Thoracic Surgery (Cardiothoracic Vascular Surgery)

## 2017-12-09 ENCOUNTER — Ambulatory Visit (INDEPENDENT_AMBULATORY_CARE_PROVIDER_SITE_OTHER): Payer: Self-pay | Admitting: Thoracic Surgery (Cardiothoracic Vascular Surgery)

## 2017-12-09 ENCOUNTER — Other Ambulatory Visit: Payer: Self-pay | Admitting: Thoracic Surgery (Cardiothoracic Vascular Surgery)

## 2017-12-09 VITALS — BP 126/77 | HR 86 | Resp 20 | Ht 67.0 in | Wt 195.0 lb

## 2017-12-09 DIAGNOSIS — Z9089 Acquired absence of other organs: Secondary | ICD-10-CM

## 2017-12-09 DIAGNOSIS — J90 Pleural effusion, not elsewhere classified: Secondary | ICD-10-CM | POA: Diagnosis not present

## 2017-12-09 DIAGNOSIS — J9859 Other diseases of mediastinum, not elsewhere classified: Secondary | ICD-10-CM

## 2017-12-09 DIAGNOSIS — Z09 Encounter for follow-up examination after completed treatment for conditions other than malignant neoplasm: Secondary | ICD-10-CM

## 2017-12-09 MED ORDER — OXYCODONE HCL 5 MG PO TABS: 5.0000 mg | ORAL_TABLET | ORAL | 0 refills | Status: DC | PRN

## 2017-12-09 NOTE — Progress Notes (Signed)
[address]       [place] [address]             [phone]     HPI: [name] returns for scheduled postoperative follow-up visit  [name] is a 62 year old woman originally from [place] who presented with atypical left-sided chest pain.  Work-up showed an anterior mediastinal mass.  I did a partial sternotomy for thymectomy on 11/24/2017.  The mass turned out to be a 3.6 cm type A/B thymoma.  It was completely contained within the thymus with no invasion of surrounding structures.  Postoperatively she did well and went home on day 3.  Since discharge she has been having incisional pain.  She complains of pain around the incision the skin is very sensitive she also has pain through her shoulders.  She has been taking gabapentin but either ran out of or never filled her prescription for oxycodone.  She has not been using NSAIDs or acetaminophen.  Past Medical History:  Diagnosis Date  . Cancer ([place]) 08/03/2010   colon  . 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 HLD (hyperlipidemia)   . Hypertension   . Uterine fibroid     Current Outpatient Medications  Medication Sig Dispense Refill  . amLODipine (NORVASC) 5 MG tablet Take 1 tablet (5 mg total) by mouth daily. 30 tablet 11  . gabapentin (NEURONTIN) 300 MG capsule Take 1 capsule (300 mg total) 3 (three) times daily by mouth. 90 capsule 3  . hydrochlorothiazide (HYDRODIURIL) 25 MG tablet TAKE ONE TABLET BY MOUTH ONE TIME DAILY 90 tablet 3  . lisinopril (PRINIVIL,ZESTRIL) 40 MG tablet TAKE 1 TABLET (40 MG TOTAL) BY MOUTH DAILY. 90 tablet 1  . lovastatin (MEVACOR) 20 MG tablet Take 1 tablet (20 mg total) by mouth at bedtime. 90 tablet 3  . rivaroxaban (XARELTO) 20 MG TABS tablet Take 20 mg by mouth daily with supper.    Marland Kitchen spironolactone (ALDACTONE) 25 MG tablet Take 1 tablet (25 mg total) by mouth daily. 90 tablet 3  . oxyCODONE (OXY IR/ROXICODONE) 5  MG immediate release tablet Take 1-2 tablets (5-10 mg total) by mouth every 4 (four) hours as needed for severe pain. 25 tablet 0   No current facility-administered medications for this visit.     Physical Exam BP 126/77   Pulse 86   Resp 20   Ht 5\' 7"  (1.702 m)   Wt 195 lb (88.5 kg)   SpO2 98% Comment: RA  BMI 30.30 kg/m  62 year old woman in no acute distress Tender to palpation around sternal incision Sternum stable Incision healing well Cardiac regular rate and rhythm Lungs clear with equal breath sounds bilaterally  Diagnostic Tests: CHEST - 2 VIEW  COMPARISON:  Portable chest x-ray of November 26, 2017  FINDINGS: The lungs are well-expanded. There is a small amount of pleural fluid blunting the costophrenic angles greatest on the right. The lung markings are coarse in the right infrahilar region though this is stable. The heart and pulmonary vascularity are normal. There is calcification in the wall of the aortic arch. The sternal wires are intact. The retrosternal soft tissues exhibit mild thickening inferiorly. The bony thorax exhibits no acute abnormality.  IMPRESSION: Small bilateral pleural effusions. No discrete infiltrate. Mild retrosternal soft tissue fullness inferiorly.  Thoracic aortic atherosclerosis.   Electronically Signed   By: David  Martinique M.D.   On: 12/09/2017 12:48  I personally  reviewed the chest x-ray images and concur with the findings noted above  Impression: [name] is a 62 year old woman who presented with atypical chest pain and was found to have an anterior mediastinal mass.  I think it was unrelated to her pain.  I did a partial sternotomy for thymectomy.  The mass turned out to be a 3.6 cm type A/B thymoma.  It was completely encapsulated with no invasion of surrounding structures.  She did well in the initial postoperative period of my home on day 3.  Today she is complaining of severe pain.  She is not taking any  narcotics.  She is only 2 weeks out from surgery. She is taking gabapentin 3 times daily which she was on prior to surgery.  I am going to give her a prescription for oxycodone 5 mg tablets 1-2 every 4 hours as needed but recommended not to take more than 4 times a day.  She should start with 1 tablet and then if that is not effective she can take a second.  I gave her prescription for 25 tablets with no refills.  She may supplement that with acetaminophen if needed.  She should not drive until her pain is improved.  She should not lift anything over 10 pounds for another 4 weeks.  Plan: Referral to multidisciplinary thoracic oncology clinic  Return in 3 weeks to check on progress  [name], MD [facility] [phone]

## 2017-12-09 NOTE — Patient Instructions (Signed)
You have a prescription for oxycodone. You may use 1 tablet up to 4 times a days as needed for pain. If the pain is severe you may use 2 tablets.  You may use acetaminophen (Tylenol) or ibuprofen (advil, Motrin) in addition to or instead of the oxycodone  Continue to take gabapentin as prescribed

## 2017-12-12 ENCOUNTER — Telehealth: Payer: Self-pay | Admitting: *Deleted

## 2017-12-12 NOTE — Telephone Encounter (Signed)
Oncology Nurse Navigator Documentation  Oncology Nurse Navigator Flowsheets 12/12/2017  Navigator Location [facility]  Navigator Encounter Type Telephone/Dr. Roxan Hockey would like patient to be seen at [facility] and Dr. Julien Nordmann. I called patient but was unable to reach her. I did leave vm message for her to call me with my name and phone number  Telephone Outgoing Call  Barriers/Navigation Needs Coordination of Care  Interventions Coordination of Care  Coordination of Care Other  Acuity Level 1  Time Spent with Patient 15

## 2017-12-24 ENCOUNTER — Other Ambulatory Visit: Payer: Self-pay | Admitting: Medical Oncology

## 2017-12-24 DIAGNOSIS — Z9089 Acquired absence of other organs: Secondary | ICD-10-CM

## 2017-12-25 ENCOUNTER — Inpatient Hospital Stay: Payer: 59 | Attending: Internal Medicine

## 2017-12-25 ENCOUNTER — Encounter: Payer: Self-pay | Admitting: Internal Medicine

## 2017-12-25 ENCOUNTER — Telehealth: Payer: Self-pay

## 2017-12-25 ENCOUNTER — Inpatient Hospital Stay (HOSPITAL_BASED_OUTPATIENT_CLINIC_OR_DEPARTMENT_OTHER): Payer: 59 | Admitting: Internal Medicine

## 2017-12-25 VITALS — BP 151/73 | HR 79 | Temp 98.4°F | Resp 17 | Ht 67.0 in | Wt 196.3 lb

## 2017-12-25 DIAGNOSIS — Z9089 Acquired absence of other organs: Secondary | ICD-10-CM

## 2017-12-25 DIAGNOSIS — E785 Hyperlipidemia, unspecified: Secondary | ICD-10-CM | POA: Insufficient documentation

## 2017-12-25 DIAGNOSIS — Z85038 Personal history of other malignant neoplasm of large intestine: Secondary | ICD-10-CM

## 2017-12-25 DIAGNOSIS — D150 Benign neoplasm of thymus: Secondary | ICD-10-CM

## 2017-12-25 DIAGNOSIS — Z79899 Other long term (current) drug therapy: Secondary | ICD-10-CM

## 2017-12-25 DIAGNOSIS — I10 Essential (primary) hypertension: Secondary | ICD-10-CM | POA: Diagnosis not present

## 2017-12-25 DIAGNOSIS — Z86718 Personal history of other venous thrombosis and embolism: Secondary | ICD-10-CM | POA: Diagnosis not present

## 2017-12-25 DIAGNOSIS — Z7901 Long term (current) use of anticoagulants: Secondary | ICD-10-CM | POA: Diagnosis not present

## 2017-12-25 DIAGNOSIS — I825Z9 Chronic embolism and thrombosis of unspecified deep veins of unspecified distal lower extremity: Secondary | ICD-10-CM

## 2017-12-25 DIAGNOSIS — C3490 Malignant neoplasm of unspecified part of unspecified bronchus or lung: Secondary | ICD-10-CM

## 2017-12-25 LAB — CMP (CANCER CENTER ONLY)
ALT: 52 U/L (ref 0–55)
AST: 43 U/L — AB (ref 5–34)
Albumin: 4.3 g/dL (ref 3.5–5.0)
Alkaline Phosphatase: 124 U/L (ref 40–150)
Anion gap: 8 (ref 3–11)
BUN: 18 mg/dL (ref 7–26)
CHLORIDE: 104 mmol/L (ref 98–109)
CO2: 29 mmol/L (ref 22–29)
CREATININE: 1.12 mg/dL — AB (ref 0.60–1.10)
Calcium: 9.8 mg/dL (ref 8.4–10.4)
GFR, Est AFR Am: 59 mL/min — ABNORMAL LOW (ref >60)
GFR, Estimated: 51 mL/min — ABNORMAL LOW (ref >60)
Glucose, Bld: 150 mg/dL — ABNORMAL HIGH (ref 70–140)
POTASSIUM: 4.3 mmol/L (ref 3.5–5.1)
SODIUM: 141 mmol/L (ref 136–145)
Total Bilirubin: 0.4 mg/dL (ref 0.2–1.2)
Total Protein: 7.1 g/dL (ref 6.4–8.3)

## 2017-12-25 LAB — CBC WITH DIFFERENTIAL (CANCER CENTER ONLY)
BASOS ABS: 0 10*3/uL (ref 0.0–0.1)
BASOS PCT: 1 %
EOS ABS: 0.1 10*3/uL (ref 0.0–0.5)
Eosinophils Relative: 1 %
HCT: 36.8 % (ref 34.8–46.6)
Hemoglobin: 12.4 g/dL (ref 11.6–15.9)
LYMPHS ABS: 1.8 10*3/uL (ref 0.9–3.3)
LYMPHS PCT: 38 %
MCH: 31 pg (ref 25.1–34.0)
MCHC: 33.7 g/dL (ref 31.5–36.0)
MCV: 92 fL (ref 79.5–101.0)
MONO ABS: 0.3 10*3/uL (ref 0.1–0.9)
MONOS PCT: 7 %
NEUTROS ABS: 2.5 10*3/uL (ref 1.5–6.5)
Neutrophils Relative %: 53 %
PLATELETS: 161 10*3/uL (ref 145–400)
RBC: 4 MIL/uL (ref 3.70–5.45)
RDW: 12.3 % (ref 11.2–14.5)
WBC Count: 4.7 10*3/uL (ref 3.9–10.3)

## 2017-12-25 NOTE — Telephone Encounter (Signed)
Printed avs and calender of upcoming per 5/16 los

## 2017-12-25 NOTE — Progress Notes (Signed)
[facility] [phone]   [phone] Multidisciplinary thoracic oncology clinic  CONSULT NOTE  REFERRING PHYSICIAN: Dr. Modesto Charon  REASON FOR CONSULTATION:  62 years old white female recently diagnosed with thymoma.  HPI [name] is a 62 y.o. female with past medical history significant for stage I colon adenocarcinoma status post low anterior partial colectomy on August 03, 2010.  She also has a history of recurrent deep venous thrombosis.  She is currently on treatment with Xarelto.  The patient mentioned that she was complaining of left-sided chest pain and she was seen by her primary care physician.  She was sent to the emergency department to rule out cardiac etiology.  During her evaluation she had chest x-ray performed on September 04, 2017 and that showed probable retrosternal lymphadenopathy.  This was followed by CT scan of the chest with contrast on September 04, 2017 and that showed anterior mediastinal soft tissue mass measuring 3.4 x 2.6 x 2.6 cm enlarged lymph node versus other soft tissue mass.  A PET scan was performed on September 23, 2017 and showed smoothly marginated anterior mediastinal mass measuring 3.1 x 2.6 cm with associated metabolic activity and SUV max of 4.9.  This favored a thymoma or low-grade lymphoma over metastatic colorectal carcinoma.  No additional evidence of colorectal cancer local recurrence or metastasis.  On November 24, 2017 the patient underwent partial sternotomy with thymectomy.  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  The tumor was confined to the thymus and the margin was negative for malignancy.  The dissected lymph nodes were negative for malignancy. The patient was referred to the multidisciplinary thoracic oncology clinic today for further evaluation and recommendation regarding her condition. When seen today she is feeling fine except for the  pain in the midsternum from the surgical scar as well as the neck bilaterally.  She denied having any shortness of breath, cough or hemoptysis.  She denied having any fever or chills.  She has no nausea, vomiting, diarrhea or constipation.  She has no headache or visual changes. Family history significant for mother died from old age and father still alive. The patient is married and has 1 son.  She works at [employer].  She has no history of smoking, alcohol or drug abuse.  HPI  Past Medical History:  Diagnosis Date  . Cancer ([place]) 08/03/2010   colon  . 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 HLD (hyperlipidemia)   . Hypertension   . Uterine fibroid     Past Surgical History:  Procedure Laterality Date  . ABDOMINAL HYSTERECTOMY    . CHOLECYSTECTOMY    . COLON SURGERY    . GALLBLADDER SURGERY    . MEDIASTERNOTOMY N/A 11/24/2017   Procedure: PARTIAL STERNOTOMY;  Surgeon: [name], MD;  Location: [facility];  Service: Thoracic;  Laterality: N/A;  . THYMECTOMY  11/24/2017   Procedure: THYMECTOMY;  Surgeon: [name], MD;  Location: [facility];  Service: Thoracic;;    Family History  Problem Relation Age of Onset  . Heart attack Mother   . Hypertension Father     Social History Social History   Tobacco Use  . Smoking status: Never Smoker  . Smokeless tobacco: Never Used  Substance Use Topics  . Alcohol use: No    Alcohol/week: 0.0 oz  . Drug use: No    No  Known Allergies  Current Outpatient Medications  Medication Sig Dispense Refill  . amLODipine (NORVASC) 5 MG tablet Take 1 tablet (5 mg total) by mouth daily. 30 tablet 11  . gabapentin (NEURONTIN) 300 MG capsule Take 1 capsule (300 mg total) 3 (three) times daily by mouth. 90 capsule 3  . hydrochlorothiazide (HYDRODIURIL) 25 MG tablet TAKE ONE TABLET BY MOUTH ONE TIME DAILY 90 tablet 3  . lisinopril (PRINIVIL,ZESTRIL) 40 MG tablet TAKE 1 TABLET (40 MG  TOTAL) BY MOUTH DAILY. 90 tablet 1  . lovastatin (MEVACOR) 20 MG tablet Take 1 tablet (20 mg total) by mouth at bedtime. 90 tablet 3  . oxyCODONE (OXY IR/ROXICODONE) 5 MG immediate release tablet Take 1-2 tablets (5-10 mg total) by mouth every 4 (four) hours as needed for severe pain. 25 tablet 0  . rivaroxaban (XARELTO) 20 MG TABS tablet Take 20 mg by mouth daily with supper.    Marland Kitchen spironolactone (ALDACTONE) 25 MG tablet Take 1 tablet (25 mg total) by mouth daily. 90 tablet 3   No current facility-administered medications for this visit.     Review of Systems  Constitutional: negative Eyes: negative Ears, nose, mouth, throat, and face: negative Respiratory: positive for pleurisy/chest pain Cardiovascular: negative Gastrointestinal: negative Genitourinary:negative Integument/breast: negative Hematologic/lymphatic: negative Musculoskeletal:negative Neurological: negative Behavioral/Psych: negative Endocrine: negative Allergic/Immunologic: negative  Physical Exam  EXB:MWUXL, healthy, no distress, well nourished and well developed SKIN: skin color, texture, turgor are normal, no rashes or significant lesions HEAD: Normocephalic, No masses, lesions, tenderness or abnormalities EYES: normal, PERRLA, Conjunctiva are pink and non-injected EARS: External ears normal, Canals clear OROPHARYNX:no exudate, no erythema and lips, buccal mucosa, and tongue normal  NECK: supple, no adenopathy, no JVD LYMPH:  no palpable lymphadenopathy, no hepatosplenomegaly BREAST:not examined LUNGS: clear to auscultation , and palpation HEART: regular rate & rhythm, no murmurs and no gallops ABDOMEN:abdomen soft, non-tender, normal bowel sounds and no masses or organomegaly BACK: Back symmetric, no curvature., No CVA tenderness EXTREMITIES:no joint deformities, effusion, or inflammation, no edema, no skin discoloration  NEURO: alert & oriented x 3 with fluent speech, no focal motor/sensory  deficits  PERFORMANCE STATUS: ECOG 1  LABORATORY DATA: Lab Results  Component Value Date   WBC 4.7 12/25/2017   HGB 12.4 12/25/2017   HCT 36.8 12/25/2017   MCV 92.0 12/25/2017   PLT 161 12/25/2017      Chemistry      Component Value Date/Time   NA 138 11/27/2017 0311   NA 145 (H) 11/11/2017 1305   NA 144 09/01/2015 1421   K 3.3 (L) 11/27/2017 0311   K 3.6 09/01/2015 1421   CL 101 11/27/2017 0311   CL 106 08/26/2012 0905   CO2 26 11/27/2017 0311   CO2 28 09/01/2015 1421   BUN 12 11/27/2017 0311   BUN 11 11/11/2017 1305   BUN 22.0 09/01/2015 1421   CREATININE 0.84 11/27/2017 0311   CREATININE 1.1 09/01/2015 1421      Component Value Date/Time   CALCIUM 9.0 11/27/2017 0311   CALCIUM 9.3 09/01/2015 1421   ALKPHOS 106 11/26/2017 0330   ALKPHOS 89 09/01/2015 1421   AST 112 (H) 11/26/2017 0330   AST 19 09/01/2015 1421   ALT 193 (H) 11/26/2017 0330   ALT 22 09/01/2015 1421   BILITOT 1.1 11/26/2017 0330   BILITOT 0.55 09/01/2015 1421       RADIOGRAPHIC STUDIES: Dg Chest 2 View  Result Date: 12/09/2017 CLINICAL DATA:  Patient underwent thymectomy on November 24, 2017. The  patient reports chest discomfort and shortness of breath. Nonsmoker. History of colonic malignancy. EXAM: CHEST - 2 VIEW COMPARISON:  Portable chest x-ray of November 26, 2017 FINDINGS: The lungs are well-expanded. There is a small amount of pleural fluid blunting the costophrenic angles greatest on the right. The lung markings are coarse in the right infrahilar region though this is stable. The heart and pulmonary vascularity are normal. There is calcification in the wall of the aortic arch. The sternal wires are intact. The retrosternal soft tissues exhibit mild thickening inferiorly. The bony thorax exhibits no acute abnormality. IMPRESSION: Small bilateral pleural effusions. No discrete infiltrate. Mild retrosternal soft tissue fullness inferiorly. Thoracic aortic atherosclerosis. Electronically Signed   By:  David  Martinique M.D.   On: 12/09/2017 12:48   Dg Chest Port 1 View  Result Date: 11/26/2017 CLINICAL DATA:  Post mediasternectomy for thymectomy EXAM: PORTABLE CHEST 1 VIEW COMPARISON:  Portable chest x-ray of 11/25/2017 FINDINGS: There is mild bibasilar atelectasis present and small pleural effusions are difficult to exclude. Right IJ central venous line tip overlies the mid SVC. Median sternotomy sutures are noted. Mediastinal and hilar contours are unremarkable and the heart is unchanged in size being within normal limits. No bony abnormality is noted. IMPRESSION: 1. Bibasilar atelectasis. 2. Cannot exclude small pleural effusions. 3. Right IJ central venous line tip overlies the SVC. Electronically Signed   By: Ivar Drape M.D.   On: 11/26/2017 08:51    ASSESSMENT: This is a very pleasant 62 years old white female recently diagnosed with stage I thymoma status post thymectomy under the care of Dr. Roxan Hockey on 11/24/2017.  There was no extra capsular extension or lymph node metastasis.   PLAN: I had a lengthy discussion with the patient today about her current disease stage, prognosis and treatment options. I explained to the patient that there is no benefit for adjuvant systemic chemotherapy or adjuvant radiation for her stage of the disease. I recommended for the patient to continue on observation with repeat CT scan of the chest in 6 months for restaging of her disease. For the recurrent deep venous thrombosis, she will continue on her treatment with Xarelto. The patient was advised to call immediately if she has any concerning symptoms in the interval. The patient voices understanding of current disease status and treatment options and is in agreement with the current care plan.  All questions were answered. The patient knows to call the clinic with any problems, questions or concerns. We can certainly see the patient much sooner if necessary.  Thank you so much for allowing me to  participate in the care of Lyne Rout. I will continue to follow up the patient with you and assist in her care.  I spent 40 minutes counseling the patient face to face. The total time spent in the appointment was 60 minutes.  Disclaimer: This note was dictated with voice recognition software. Similar sounding words can inadvertently be transcribed and may not be corrected upon review.   Eilleen Kempf Dec 25, 2017, 3:36 PM

## 2017-12-30 ENCOUNTER — Encounter: Payer: Self-pay | Admitting: Thoracic Surgery (Cardiothoracic Vascular Surgery)

## 2017-12-30 ENCOUNTER — Other Ambulatory Visit: Payer: Self-pay | Admitting: *Deleted

## 2017-12-30 ENCOUNTER — Ambulatory Visit (INDEPENDENT_AMBULATORY_CARE_PROVIDER_SITE_OTHER): Payer: Self-pay | Admitting: Thoracic Surgery (Cardiothoracic Vascular Surgery)

## 2017-12-30 VITALS — BP 141/80 | HR 77 | Resp 16 | Ht 67.0 in | Wt 196.0 lb

## 2017-12-30 DIAGNOSIS — Z9089 Acquired absence of other organs: Secondary | ICD-10-CM

## 2017-12-30 DIAGNOSIS — C37 Malignant neoplasm of thymus: Secondary | ICD-10-CM

## 2017-12-30 MED ORDER — RIVAROXABAN 20 MG PO TABS: 20.0000 mg | ORAL_TABLET | Freq: Every day | ORAL | 1 refills | Status: DC

## 2017-12-30 NOTE — Progress Notes (Signed)
[address]       [place] [address]             [phone]    HPI: [name] returns for a scheduled follow-up visit  [name] is a 62 year old woman who presented with atypical left-sided chest pain.  She was found to have an anterior mediastinal mass.  I did a partial sternotomy and thymectomy on 11/24/2017.  This turned out to be a 3.6 cm thymoma with no evidence of invasion.  She went home on day 3.  I saw her in the office on 12/09/2017.  She was having a lot of incisional pain.  She had not ever filled her prescription for oxycodone.  I prescribed oxycodone and also advised her to use acetaminophen as well.  She says that has helped.  She is still taking the oxycodone a couple of times a day.  Her Xarelto prescription has run out but she has multiple refills.  Past Medical History:  Diagnosis Date  . Cancer ([place]) 08/03/2010   colon  . 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 HLD (hyperlipidemia)   . Hypertension   . Uterine fibroid     Current Outpatient Medications  Medication Sig Dispense Refill  . amLODipine (NORVASC) 5 MG tablet Take 1 tablet (5 mg total) by mouth daily. 30 tablet 11  . gabapentin (NEURONTIN) 300 MG capsule Take 1 capsule (300 mg total) 3 (three) times daily by mouth. 90 capsule 3  . hydrochlorothiazide (HYDRODIURIL) 25 MG tablet TAKE ONE TABLET BY MOUTH ONE TIME DAILY 90 tablet 3  . lisinopril (PRINIVIL,ZESTRIL) 40 MG tablet TAKE 1 TABLET (40 MG TOTAL) BY MOUTH DAILY. 90 tablet 1  . lovastatin (MEVACOR) 20 MG tablet Take 1 tablet (20 mg total) by mouth at bedtime. 90 tablet 3  . oxyCODONE (OXY IR/ROXICODONE) 5 MG immediate release tablet Take 1-2 tablets (5-10 mg total) by mouth every 4 (four) hours as needed for severe pain. 25 tablet 0  . rivaroxaban (XARELTO) 20 MG TABS tablet Take 20 mg by mouth daily with supper.    Marland Kitchen spironolactone (ALDACTONE) 25 MG tablet Take 1  tablet (25 mg total) by mouth daily. 90 tablet 3   No current facility-administered medications for this visit.     Physical Exam BP (!) 141/80 (BP Location: Right Arm, Patient Position: Sitting, Cuff Size: Large)   Pulse 77   Resp 16   Ht 5\' 7"  (1.702 m)   Wt 196 lb (88.9 kg)   SpO2 98% Comment: ON RA  BMI 30.44 kg/m  62 year old woman in no acute distress Alert and oriented x3 with no focal deficits Sternal incision healing well, sternum stable, mildly tender to palpation Cardiac regular rate and rhythm normal S1 and S2  Impression: [name] is a 62 year old woman who is about 5 weeks out from a thymectomy for thymoma.  She is still having incisional pain although it is much improved from her last visit 3 weeks ago.  She is still using oxycodone.  I am okay with her continuing to use that as needed but did advise her of the need to wean off that medication due to its potential for tolerance and addiction.  I did advise her to use acetaminophen as well.  She saw Dr. Earlie Server.  No adjuvant treatment is needed.  He plans to see her back in 6 months with a CT of the chest.  Advised her of the need to stay on Xarelto due to her history of DVT.  She will get the medication refilled.  She should be able to return to work by July 1.  If she approaches that date and does not feel comfortable she will call and come back to see [place].  Plan: I will plan to see her back in about 2months after she has her CT of the chest.  I will be happy to see her anytime in the interim if I can be of any further assistance with her care  [name], MD [facility] [phone]

## 2017-12-30 NOTE — Progress Notes (Signed)
[name] has requested a refill for her Xarelto. She said that when she called her PCP, they said she would need to come for an office visit before it would be refilled. She only has three pills left. I offered to refill I for two months until she could get a follow up with her. She was most appreciative. She did mention that the company and/or her insurance said that she had to have a new prescription every two months, but said she might be confused about this. I said I would still attempt to reorder it for her.

## 2018-01-04 ENCOUNTER — Other Ambulatory Visit: Payer: Self-pay | Admitting: Family Medicine

## 2018-01-04 DIAGNOSIS — I10 Essential (primary) hypertension: Secondary | ICD-10-CM

## 2018-01-05 ENCOUNTER — Other Ambulatory Visit: Payer: Self-pay | Admitting: Family Medicine

## 2018-01-05 DIAGNOSIS — I10 Essential (primary) hypertension: Secondary | ICD-10-CM

## 2018-02-07 ENCOUNTER — Other Ambulatory Visit: Payer: Self-pay | Admitting: Family Medicine

## 2018-02-07 DIAGNOSIS — E785 Hyperlipidemia, unspecified: Secondary | ICD-10-CM

## 2018-03-10 DIAGNOSIS — S60221A Contusion of right hand, initial encounter: Secondary | ICD-10-CM | POA: Diagnosis not present

## 2018-04-24 ENCOUNTER — Other Ambulatory Visit: Payer: Self-pay | Admitting: Family Medicine

## 2018-04-24 ENCOUNTER — Telehealth: Payer: Self-pay | Admitting: Cardiovascular Disease

## 2018-04-24 DIAGNOSIS — I10 Essential (primary) hypertension: Secondary | ICD-10-CM

## 2018-04-24 NOTE — Telephone Encounter (Signed)
Close this encounter

## 2018-05-02 NOTE — Progress Notes (Addendum)
[place] [facility] [address], [place], [place] [address] [phone]  Date:  01/26/736   Name:  [name]   DOB:  16-Dec-1955   MRN:  [id number]  PCP:  Darreld Mclean, MD    Chief Complaint: Annual Exam (no flu shot, pap , not taking any of her meds-thinks there are too many causing abdominal pain) and Chronic Kidney Disease (lab results from after surgery, thymectomy)   History of Present Illness:  Aneesa Torres is a 62 y.o. very pleasant female patient who presents with the following:  Following up for a CPE today Last seen here in January at which time Evelisse was noted to have a mediastinal mass- thymoma Dr. Roxan Hockey of CT surgery did a resection for her back in April- per his last note: [name] is a 62 year old woman who is about 5 weeks out from a thymectomy for thymoma.  She is still having incisional pain although it is much improved from her last visit 3 weeks ago.  She is still using oxycodone.  I am okay with her continuing to use that as needed but did advise her of the need to wean off that medication due to its potential for tolerance and addiction.  I did advise her to use acetaminophen as well. She saw Dr. Earlie Server.  No adjuvant treatment is needed.  He plans to see her back in 6 months with a CT of the chest. Advised her of the need to stay on Xarelto due to her history of DVT.  She will get the medication refilled. I will plan to see her back in about 59months after she has her CT of the chest.   She is also following with Dr. Julien Nordmann- per his most recent note also in May: ASSESSMENT: This is a very pleasant 62 years old white female recently diagnosed with stage I thymoma status post thymectomy under the care of Dr. Roxan Hockey on 11/24/2017.  There was no extra capsular extension or lymph node metastasis. PLAN: I had a lengthy discussion with the patient today about her current disease  stage, prognosis and treatment options. I explained to the patient that there is no benefit for adjuvant systemic chemotherapy or adjuvant radiation for her stage of the disease. I recommended for the patient to continue on observation with repeat CT scan of the chest in 6 months for restaging of her disease. For the recurrent deep venous thrombosis, she will continue on her treatment with Xarelto.  Also with history of HTN, colon cancer, DVT  Flu shot due today- pt declines  S/p hysterectomy.   She is not sure of her follow-up history here.  Will get a pap for her today  mammo is due; will order for her today She is fasting today for labs   She is seeing oncology in November and also CT surgery for a follow-up visit  The area is still tender from her operation but overall doing well   She has stopped several of her meds- she is taking lisinopril and xarelto only Not taking amlodipine, hctz, lovastatin, sprio  BP Readings from Last 3 Encounters:  05/07/18 (!) 160/90  12/30/17 (!) 141/80  12/25/17 (!) 151/73     Patient Active Problem List   Diagnosis Date Noted  . 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 Bilateral leg pain 07/02/2016  . History of DVT of lower extremity 05/31/2016  . History of colon cancer, stage I 08/17/2015  .  Varicose 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 08/26/2014  . HTN (hypertension) 12/24/2013  . Cancer of left colon ([facility]) 02/25/2012  . DVT (deep venous thrombosis) ([facility]) 08/27/2011    Past Medical History:  Diagnosis Date  . Cancer ([facility]) 08/03/2010   colon  . 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 HLD (hyperlipidemia)   . Hypertension   . Uterine fibroid     Past Surgical History:  Procedure Laterality Date  . ABDOMINAL HYSTERECTOMY    . CHOLECYSTECTOMY    . COLON SURGERY    . GALLBLADDER SURGERY    . MEDIASTERNOTOMY N/A 11/24/2017   Procedure: PARTIAL STERNOTOMY;  Surgeon: [name],  MD;  Location: [facility];  Service: Thoracic;  Laterality: N/A;  . THYMECTOMY  11/24/2017   Procedure: THYMECTOMY;  Surgeon: [name], MD;  Location: [facility] OR;  Service: Thoracic;;    Social History   Tobacco Use  . Smoking status: Never Smoker  . Smokeless tobacco: Never Used  Substance Use Topics  . Alcohol use: No    Alcohol/week: 0.0 standard drinks  . Drug use: No    Family History  Problem Relation Age of Onset  . Heart attack Mother   . Hypertension Father     No Known Allergies  Medication list has been reviewed and updated.  Current Outpatient Medications on File Prior to Visit  Medication Sig Dispense Refill  . lisinopril (PRINIVIL,ZESTRIL) 40 MG tablet TAKE 1 TABLET BY MOUTH EVERY DAY 30 tablet 0  . rivaroxaban (XARELTO) 20 MG TABS tablet Take 1 tablet (20 mg total) by mouth daily with supper. 30 tablet 1  . amLODipine (NORVASC) 5 MG tablet Take 1 tablet (5 mg total) by mouth daily. (Patient not taking: Reported on 05/07/2018) 30 tablet 11  . gabapentin (NEURONTIN) 300 MG capsule Take 1 capsule (300 mg total) 3 (three) times daily by mouth. (Patient not taking: Reported on 05/07/2018) 90 capsule 3  . hydrochlorothiazide (HYDRODIURIL) 25 MG tablet TAKE 1 TABLET EVERY DAY (Patient not taking: Reported on 05/07/2018) 90 tablet 0  . lovastatin (MEVACOR) 20 MG tablet TAKE 1 TABLET AT BEDTIME--30 DAYS ONLY-- (Patient not taking: Reported on 05/07/2018) 30 tablet 0  . spironolactone (ALDACTONE) 25 MG tablet Take 1 tablet (25 mg total) by mouth daily. 90 tablet 3   No current facility-administered medications on file prior to visit.     Review of Systems:  As per HPI- otherwise negative. No fever or chills No vomiting or diarrhea    Physical Examination: Vitals:   05/07/18 0858  BP: (!) 160/90  Pulse: 68  Resp: 16  Temp: 98 F (36.7 C)  SpO2: 98%   Vitals:   05/07/18 0858  Weight: 186 lb (84.4 kg)  Height: 5\' 7"  (1.702 m)   Body mass index is 29.13  kg/m. Ideal Body Weight: Weight in (lb) to have BMI = 25: 159.3  GEN: WDWN, NAD, Non-toxic, A & O x 3, looks well, robust build  HEENT: Atraumatic, Normocephalic. Neck supple. No masses, No LAD.Bilateral TM wnl, oropharynx normal.  PEERL,EOMI.   Ears and Nose: No external deformity. CV: RRR, No M/G/R. No JVD. No thrill. No extra heart sounds. PULM: CTA B, no wheezes, crackles, rhonchi. No retractions. No resp. distress. No accessory muscle use. ABD: S, NT, ND, +BS. No rebound. No HSM. EXTR: No c/c/e NEURO Normal gait.  PSYCH: Normally interactive. Conversant. Not depressed or anxious appearing.  Calm  demeanor.  Pelvic: normal, no vaginal lesions or discharge. S/p hyst, some vaginal atrophy, no adnexal tendereness or masses  Assessment and Plan: Physical exam  Anticoagulant long-term use - Plan: CBC, Comprehensive metabolic panel, rivaroxaban (XARELTO) 20 MG TABS tablet  Vitamin D deficiency - Plan: Vitamin D (25 hydroxy)  Dyslipidemia - Plan: Lipid panel, lovastatin (MEVACOR) 20 MG tablet  Screening for diabetes mellitus - Plan: Comprehensive metabolic panel, Hemoglobin A1c  Essential hypertension - Plan: amLODipine (NORVASC) 5 MG tablet, lisinopril (PRINIVIL,ZESTRIL) 40 MG tablet  History of colon cancer, stage I  S/P thymectomy  Screening for cervical cancer - Plan: Cytology - PAP  Screening for breast cancer - Plan: MM 3D SCREEN BREAST BILATERAL  CPE today Pap mammo ordered, but she may be able to get this done at her job BP incompletely controlled- add back amlodipine to her lisinopril Restart her statin Continue xarelto  Will plan further follow- up pending labs.   She may be overdue to see Dr. Benson Norway for a colonoscopy- remind pt with her labs   Signed Lamar Blinks, MD  Received her labs 9/27- message to pt  Blood counts look ok  Metabolic profile is ok Your A1c remains in the pre-diabetes range Your cholesterol is high but you had stopped taking your  cholesterol medication- we re-started this (lovastatin) Vitamin D is low.  I am going to send in a course of vitamin D for you to take once a week for 12 weeks. Then please continue to take vitamin D from the drug store, 2,000IU per day  Also, I think you may be overdue to see Dr. Benson Norway and have a colonoscopy?   I will place a referral for you to see him and follow-up  Please see me in 6 months for a recheck and take care!  Results for orders placed or performed in visit on 05/07/18  CBC  Result Value Ref Range   WBC 3.5 (L) 4.0 - 10.5 K/uL   RBC 4.70 3.87 - 5.11 Mil/uL   Platelets 163.0 150.0 - 400.0 K/uL   Hemoglobin 14.5 12.0 - 15.0 g/dL   HCT 43.3 36.0 - 46.0 %   MCV 92.1 78.0 - 100.0 fl   MCHC 33.6 30.0 - 36.0 g/dL   RDW 12.4 11.5 - 15.5 %  Comprehensive metabolic panel  Result Value Ref Range   Sodium 142 135 - 145 mEq/L   Potassium 3.9 3.5 - 5.1 mEq/L   Chloride 106 96 - 112 mEq/L   CO2 30 19 - 32 mEq/L   Glucose, Bld 127 (H) 70 - 99 mg/dL   BUN 18 6 - 23 mg/dL   Creatinine, Ser 0.95 0.40 - 1.20 mg/dL   Total Bilirubin 0.8 0.2 - 1.2 mg/dL   Alkaline Phosphatase 86 39 - 117 U/L   AST 17 0 - 37 U/L   ALT 17 0 - 35 U/L   Total Protein 7.0 6.0 - 8.3 g/dL   Albumin 4.6 3.5 - 5.2 g/dL   Calcium 9.5 8.4 - 10.5 mg/dL   GFR 62.85 >60.00 mL/min  Hemoglobin A1c  Result Value Ref Range   Hgb A1c MFr Bld 6.2 4.6 - 6.5 %  Lipid panel  Result Value Ref Range   Cholesterol 252 (H) 0 - 200 mg/dL   Triglycerides 107.0 0.0 - 149.0 mg/dL   HDL 56.30 >39.00 mg/dL   VLDL 21.4 0.0 - 40.0 mg/dL   LDL Cholesterol 174 (H) 0 - 99 mg/dL   Total CHOL/HDL Ratio 4  NonHDL 195.82   Vitamin D (25 hydroxy)  Result Value Ref Range   VITD 19.03 (L) 30.00 - 100.00 ng/mL

## 2018-05-07 ENCOUNTER — Ambulatory Visit (INDEPENDENT_AMBULATORY_CARE_PROVIDER_SITE_OTHER): Payer: 59 | Admitting: Family Medicine

## 2018-05-07 ENCOUNTER — Encounter: Payer: Self-pay | Admitting: Family Medicine

## 2018-05-07 ENCOUNTER — Other Ambulatory Visit (HOSPITAL_COMMUNITY)
Admission: RE | Admit: 2018-05-07 | Discharge: 2018-05-07 | Disposition: A | Discharge status: Home / Self Care | Payer: 59 | Source: Ambulatory Visit | Attending: Family Medicine | Admitting: Family Medicine

## 2018-05-07 VITALS — BP 160/90 | HR 68 | Temp 98.0°F | Resp 16 | Ht 67.0 in | Wt 186.0 lb

## 2018-05-07 DIAGNOSIS — Z0000 Encounter for general adult medical examination without abnormal findings: Secondary | ICD-10-CM | POA: Diagnosis not present

## 2018-05-07 DIAGNOSIS — Z1239 Encounter for other screening for malignant neoplasm of breast: Secondary | ICD-10-CM

## 2018-05-07 DIAGNOSIS — Z85038 Personal history of other malignant neoplasm of large intestine: Secondary | ICD-10-CM

## 2018-05-07 DIAGNOSIS — Z1231 Encounter for screening mammogram for malignant neoplasm of breast: Secondary | ICD-10-CM

## 2018-05-07 DIAGNOSIS — Z124 Encounter for screening for malignant neoplasm of cervix: Secondary | ICD-10-CM | POA: Insufficient documentation

## 2018-05-07 DIAGNOSIS — E785 Hyperlipidemia, unspecified: Secondary | ICD-10-CM

## 2018-05-07 DIAGNOSIS — E559 Vitamin D deficiency, unspecified: Secondary | ICD-10-CM

## 2018-05-07 DIAGNOSIS — Z7901 Long term (current) use of anticoagulants: Secondary | ICD-10-CM | POA: Diagnosis not present

## 2018-05-07 DIAGNOSIS — I10 Essential (primary) hypertension: Secondary | ICD-10-CM

## 2018-05-07 DIAGNOSIS — Z131 Encounter for screening for diabetes mellitus: Secondary | ICD-10-CM | POA: Diagnosis not present

## 2018-05-07 DIAGNOSIS — Z9089 Acquired absence of other organs: Secondary | ICD-10-CM

## 2018-05-07 LAB — COMPREHENSIVE METABOLIC PANEL
ALT: 17 U/L (ref 0–35)
AST: 17 U/L (ref 0–37)
Albumin: 4.6 g/dL (ref 3.5–5.2)
Alkaline Phosphatase: 86 U/L (ref 39–117)
BUN: 18 mg/dL (ref 6–23)
CHLORIDE: 106 meq/L (ref 96–112)
CO2: 30 mEq/L (ref 19–32)
Calcium: 9.5 mg/dL (ref 8.4–10.5)
Creatinine, Ser: 0.95 mg/dL (ref 0.40–1.20)
GFR: 62.85 mL/min (ref >60.00)
GLUCOSE: 127 mg/dL — AB (ref 70–99)
POTASSIUM: 3.9 meq/L (ref 3.5–5.1)
SODIUM: 142 meq/L (ref 135–145)
TOTAL PROTEIN: 7 g/dL (ref 6.0–8.3)
Total Bilirubin: 0.8 mg/dL (ref 0.2–1.2)

## 2018-05-07 LAB — CBC
HEMATOCRIT: 43.3 % (ref 36.0–46.0)
HEMOGLOBIN: 14.5 g/dL (ref 12.0–15.0)
MCHC: 33.6 g/dL (ref 30.0–36.0)
MCV: 92.1 fl (ref 78.0–100.0)
Platelets: 163 10*3/uL (ref 150.0–400.0)
RBC: 4.7 Mil/uL (ref 3.87–5.11)
RDW: 12.4 % (ref 11.5–15.5)
WBC: 3.5 10*3/uL — AB (ref 4.0–10.5)

## 2018-05-07 LAB — VITAMIN D 25 HYDROXY (VIT D DEFICIENCY, FRACTURES): VITD: 19.03 ng/mL — ABNORMAL LOW (ref 30.00–100.00)

## 2018-05-07 LAB — LIPID PANEL
CHOLESTEROL: 252 mg/dL — AB (ref 0–200)
HDL: 56.3 mg/dL (ref >39.00)
LDL Cholesterol: 174 mg/dL — ABNORMAL HIGH (ref 0–99)
NonHDL: 195.82
Total CHOL/HDL Ratio: 4
Triglycerides: 107 mg/dL (ref 0.0–149.0)
VLDL: 21.4 mg/dL (ref 0.0–40.0)

## 2018-05-07 LAB — HEMOGLOBIN A1C: Hgb A1c MFr Bld: 6.2 % (ref 4.6–6.5)

## 2018-05-07 MED ORDER — RIVAROXABAN 20 MG PO TABS: 20.0000 mg | ORAL_TABLET | Freq: Every day | ORAL | 6 refills | Status: DC

## 2018-05-07 MED ORDER — LOVASTATIN 20 MG PO TABS: ORAL_TABLET | ORAL | 3 refills | Status: DC

## 2018-05-07 MED ORDER — LISINOPRIL 40 MG PO TABS: 40.0000 mg | ORAL_TABLET | Freq: Every day | ORAL | 3 refills | Status: DC

## 2018-05-07 MED ORDER — AMLODIPINE BESYLATE 5 MG PO TABS: 5.0000 mg | ORAL_TABLET | Freq: Every day | ORAL | 3 refills | Status: DC

## 2018-05-07 NOTE — Patient Instructions (Addendum)
Good to see you today!  I will be in touch with your labs asap Please be sure to see your oncologist and surgeon in Nov[place] as planned I am not sure if you may be able to stop your xarelto eventually-please ask Dr. Julien Nordmann about this when you see him  You can certainly do a mammogram when offered at your job- just be sure to get one this year.  Let's have you start back on your cholesterol med, and also the amlodipine for blood pressure Continue to take lisinopril and xaretlo This will be just 4 mediations to take   Take care!   Health Maintenance, Female Adopting a healthy lifestyle and getting preventive care can go a long way to promote health and wellness. Talk with your health care provider about what schedule of regular examinations is right for you. This is a good chance for you to check in with your provider about disease prevention and staying healthy. In between checkups, there are plenty of things you can do on your own. Experts have done a lot of research about which lifestyle changes and preventive measures are most likely to keep you healthy. Ask your health care provider for more information. Weight and diet Eat a healthy diet  Be sure to include plenty of vegetables, fruits, low-fat dairy products, and lean protein.  Do not eat a lot of foods high in solid fats, added sugars, or salt.  Get regular exercise. This is one of the most important things you can do for your health. ? Most adults should exercise for at least 150 minutes each week. The exercise should increase your heart rate and make you sweat (moderate-intensity exercise). ? Most adults should also do strengthening exercises at least twice a week. This is in addition to the moderate-intensity exercise.  Maintain a healthy weight  Body mass index (BMI) is a measurement that can be used to identify possible weight problems. It estimates body fat based on height and weight. Your health care provider can help  determine your BMI and help you achieve or maintain a healthy weight.  For females 59 years of age and older: ? A BMI below 18.5 is considered underweight. ? A BMI of 18.5 to 24.9 is normal. ? A BMI of 25 to 29.9 is considered overweight. ? A BMI of 30 and above is considered obese.  Watch levels of cholesterol and blood lipids  You should start having your blood tested for lipids and cholesterol at 64 years of age, then have this test every 5 years.  You may need to have your cholesterol levels checked more often if: ? Your lipid or cholesterol levels are high. ? You are older than 64 years of age. ? You are at high risk for heart disease.  Cancer screening Lung Cancer  Lung cancer screening is recommended for adults 25-96 years old who are at high risk for lung cancer because of a history of smoking.  A yearly low-dose CT scan of the lungs is recommended for people who: ? Currently smoke. ? Have quit within the past 15 years. ? Have at least a 30-pack-year history of smoking. A pack year is smoking an average of one pack of cigarettes a day for 1 year.  Yearly screening should continue until it has been 15 years since you quit.  Yearly screening should stop if you develop a health problem that would prevent you from having lung cancer treatment.  Breast Cancer  Practice breast self-awareness. This means  understanding how your breasts normally appear and feel.  It also means doing regular breast self-exams. Let your health care provider know about any changes, no matter how small.  If you are in your 20s or 30s, you should have a clinical breast exam (CBE) by a health care provider every 1-3 years as part of a regular health exam.  If you are 32 or older, have a CBE every year. Also consider having a breast X-ray (mammogram) every year.  If you have a family history of breast cancer, talk to your health care provider about genetic screening.  If you are at high risk for  breast cancer, talk to your health care provider about having an MRI and a mammogram every year.  Breast cancer gene (BRCA) assessment is recommended for women who have family members with BRCA-related cancers. BRCA-related cancers include: ? Breast. ? Ovarian. ? Tubal. ? Peritoneal cancers.  Results of the assessment will determine the need for genetic counseling and BRCA1 and BRCA2 testing.  Cervical Cancer Your health care provider may recommend that you be screened regularly for cancer of the pelvic organs (ovaries, uterus, and vagina). This screening involves a pelvic examination, including checking for microscopic changes to the surface of your cervix (Pap test). You may be encouraged to have this screening done every 3 years, beginning at age 3.  For women ages 62-65, health care providers may recommend pelvic exams and Pap testing every 3 years, or they may recommend the Pap and pelvic exam, combined with testing for human papilloma virus (HPV), every 5 years. Some types of HPV increase your risk of cervical cancer. Testing for HPV may also be done on women of any age with unclear Pap test results.  Other health care providers may not recommend any screening for nonpregnant women who are considered low risk for pelvic cancer and who do not have symptoms. Ask your health care provider if a screening pelvic exam is right for you.  If you have had past treatment for cervical cancer or a condition that could lead to cancer, you need Pap tests and screening for cancer for at least 20 years after your treatment. If Pap tests have been discontinued, your risk factors (such as having a new sexual partner) need to be reassessed to determine if screening should resume. Some women have medical problems that increase the chance of getting cervical cancer. In these cases, your health care provider may recommend more frequent screening and Pap tests.  Colorectal Cancer  This type of cancer can be  detected and often prevented.  Routine colorectal cancer screening usually begins at 64 years of age and continues through 64 years of age.  Your health care provider may recommend screening at an earlier age if you have risk factors for colon cancer.  Your health care provider may also recommend using home test kits to check for hidden blood in the stool.  A small camera at the end of a tube can be used to examine your colon directly (sigmoidoscopy or colonoscopy). This is done to check for the earliest forms of colorectal cancer.  Routine screening usually begins at age 66.  Direct examination of the colon should be repeated every 5-10 years through 64 years of age. However, you may need to be screened more often if early forms of precancerous polyps or small growths are found.  Skin Cancer  Check your skin from head to toe regularly.  Tell your health care provider about any new moles  or changes in moles, especially if there is a change in a mole's shape or color.  Also tell your health care provider if you have a mole that is larger than the size of a pencil eraser.  Always use sunscreen. Apply sunscreen liberally and repeatedly throughout the day.  Protect yourself by wearing long sleeves, pants, a wide-brimmed hat, and sunglasses whenever you are outside.  Heart disease, diabetes, and high blood pressure  High blood pressure causes heart disease and increases the risk of stroke. High blood pressure is more likely to develop in: ? People who have blood pressure in the high end of the normal range (130-139/85-89 mm Hg). ? People who are overweight or obese. ? People who are African American.  If you are 20-5 years of age, have your blood pressure checked every 3-5 years. If you are 95 years of age or older, have your blood pressure checked every year. You should have your blood pressure measured twice-once when you are at a hospital or clinic, and once when you are not at a  hospital or clinic. Record the average of the two measurements. To check your blood pressure when you are not at a hospital or clinic, you can use: ? An automated blood pressure machine at a pharmacy. ? A home blood pressure monitor.  If you are between 63 years and 18 years old, ask your health care provider if you should take aspirin to prevent strokes.  Have regular diabetes screenings. This involves taking a blood sample to check your fasting blood sugar level. ? If you are at a normal weight and have a low risk for diabetes, have this test once every three years after 64 years of age. ? If you are overweight and have a high risk for diabetes, consider being tested at a younger age or more often. Preventing infection Hepatitis B  If you have a higher risk for hepatitis B, you should be screened for this virus. You are considered at high risk for hepatitis B if: ? You were born in a country where hepatitis B is common. Ask your health care provider which countries are considered high risk. ? Your parents were born in a high-risk country, and you have not been immunized against hepatitis B (hepatitis B vaccine). ? You have HIV or AIDS. ? You use needles to inject street drugs. ? You live with someone who has hepatitis B. ? You have had sex with someone who has hepatitis B. ? You get hemodialysis treatment. ? You take certain medicines for conditions, including cancer, organ transplantation, and autoimmune conditions.  Hepatitis C  Blood testing is recommended for: ? Everyone born from [birth year] through 1967. ? Anyone with known risk factors for hepatitis C.  Sexually transmitted infections (STIs)  You should be screened for sexually transmitted infections (STIs) including gonorrhea and chlamydia if: ? You are sexually active and are younger than 64 years of age. ? You are older than 64 years of age and your health care provider tells you that you are at risk for this type of  infection. ? Your sexual activity has changed since you were last screened and you are at an increased risk for chlamydia or gonorrhea. Ask your health care provider if you are at risk.  If you do not have HIV, but are at risk, it may be recommended that you take a prescription medicine daily to prevent HIV infection. This is called pre-exposure prophylaxis (PrEP). You are considered at risk if: ?  You are sexually active and do not regularly use condoms or know the HIV status of your partner(s). ? You take drugs by injection. ? You are sexually active with a partner who has HIV.  Talk with your health care provider about whether you are at high risk of being infected with HIV. If you choose to begin PrEP, you should first be tested for HIV. You should then be tested every 3 months for as long as you are taking PrEP. Pregnancy  If you are premenopausal and you may become pregnant, ask your health care provider about preconception counseling.  If you may become pregnant, take 400 to 800 micrograms (mcg) of folic acid every day.  If you want to prevent pregnancy, talk to your health care provider about birth control (contraception). Osteoporosis and menopause  Osteoporosis is a disease in which the bones lose minerals and strength with aging. This can result in serious bone fractures. Your risk for osteoporosis can be identified using a bone density scan.  If you are 39 years of age or older, or if you are at risk for osteoporosis and fractures, ask your health care provider if you should be screened.  Ask your health care provider whether you should take a calcium or vitamin D supplement to lower your risk for osteoporosis.  Menopause may have certain physical symptoms and risks.  Hormone replacement therapy may reduce some of these symptoms and risks. Talk to your health care provider about whether hormone replacement therapy is right for you. Follow these instructions at home:  Schedule  regular health, dental, and eye exams.  Stay current with your immunizations.  Do not use any tobacco products including cigarettes, chewing tobacco, or electronic cigarettes.  If you are pregnant, do not drink alcohol.  If you are breastfeeding, limit how much and how often you drink alcohol.  Limit alcohol intake to no more than 1 drink per day for nonpregnant women. One drink equals 12 ounces of beer, 5 ounces of wine, or 1 ounces of hard liquor.  Do not use street drugs.  Do not share needles.  Ask your health care provider for help if you need support or information about quitting drugs.  Tell your health care provider if you often feel depressed.  Tell your health care provider if you have ever been abused or do not feel safe at home. This information is not intended to replace advice given to you by your health care provider. Make sure you discuss any questions you have with your health care provider. Document Released: 02/11/2011 Document Revised: 01/04/2016 Document Reviewed: 05/02/2015 Elsevier Interactive Patient Education  [employer].

## 2018-05-08 ENCOUNTER — Encounter: Payer: Self-pay | Admitting: Family Medicine

## 2018-05-08 MED ORDER — VITAMIN D (ERGOCALCIFEROL) 1.25 MG (50000 UNIT) PO CAPS: 50000.0000 [IU] | ORAL_CAPSULE | ORAL | 0 refills | Status: DC

## 2018-05-08 NOTE — Addendum Note (Signed)
Addended by: Lamar Blinks C on: 05/08/2018 05:56 AM   Modules accepted: Orders

## 2018-05-12 ENCOUNTER — Encounter: Payer: Self-pay | Admitting: Family Medicine

## 2018-05-12 DIAGNOSIS — H524 Presbyopia: Secondary | ICD-10-CM | POA: Diagnosis not present

## 2018-05-12 LAB — CYTOLOGY - PAP
DIAGNOSIS: NEGATIVE
HPV (WINDOPATH): NOT DETECTED

## 2018-05-14 ENCOUNTER — Telehealth: Payer: Self-pay

## 2018-05-14 NOTE — Telephone Encounter (Signed)
Issue has been resolve. Spoke with cytology they sent back the specimen and I labeled. Pap has been resent and resulted.

## 2018-05-14 NOTE — Telephone Encounter (Signed)
Copied from [place] [id number]. Topic: Inquiry >> May 07, 2018 12:55 PM Scherrie Gerlach wrote: Reason for CRM: [facility] with [facility] states the pap they got on this pt had the paperwork, but speciman not labeled.  They have to reject and send back. Peter Congo needs to speak with someone concerning this. Please call

## 2018-05-29 ENCOUNTER — Telehealth: Payer: Self-pay | Admitting: Internal Medicine

## 2018-05-29 NOTE — Telephone Encounter (Signed)
MM PAL 11/14 - moved f/u to 11/21. Other appointments remain the same. Left message. Schedule mailed.

## 2018-06-04 DIAGNOSIS — Z1231 Encounter for screening mammogram for malignant neoplasm of breast: Secondary | ICD-10-CM | POA: Diagnosis not present

## 2018-06-23 ENCOUNTER — Inpatient Hospital Stay: Payer: 59 | Attending: Internal Medicine

## 2018-06-23 DIAGNOSIS — Z7901 Long term (current) use of anticoagulants: Secondary | ICD-10-CM | POA: Diagnosis not present

## 2018-06-23 DIAGNOSIS — I10 Essential (primary) hypertension: Secondary | ICD-10-CM | POA: Diagnosis not present

## 2018-06-23 DIAGNOSIS — Z79899 Other long term (current) drug therapy: Secondary | ICD-10-CM | POA: Diagnosis not present

## 2018-06-23 DIAGNOSIS — Z85038 Personal history of other malignant neoplasm of large intestine: Secondary | ICD-10-CM | POA: Insufficient documentation

## 2018-06-23 DIAGNOSIS — E785 Hyperlipidemia, unspecified: Secondary | ICD-10-CM | POA: Insufficient documentation

## 2018-06-23 DIAGNOSIS — Z86718 Personal history of other venous thrombosis and embolism: Secondary | ICD-10-CM | POA: Insufficient documentation

## 2018-06-23 DIAGNOSIS — D150 Benign neoplasm of thymus: Secondary | ICD-10-CM | POA: Diagnosis not present

## 2018-06-23 DIAGNOSIS — C3490 Malignant neoplasm of unspecified part of unspecified bronchus or lung: Secondary | ICD-10-CM

## 2018-06-23 LAB — CBC WITH DIFFERENTIAL (CANCER CENTER ONLY)
Abs Immature Granulocytes: 0.02 10*3/uL (ref 0.00–0.07)
Basophils Absolute: 0.1 10*3/uL (ref 0.0–0.1)
Basophils Relative: 1 %
Eosinophils Absolute: 0.1 10*3/uL (ref 0.0–0.5)
Eosinophils Relative: 2 %
HCT: 39.8 % (ref 36.0–46.0)
HEMOGLOBIN: 12.9 g/dL (ref 12.0–15.0)
Immature Granulocytes: 0 %
LYMPHS ABS: 2.3 10*3/uL (ref 0.7–4.0)
LYMPHS PCT: 50 %
MCH: 30.5 pg (ref 26.0–34.0)
MCHC: 32.4 g/dL (ref 30.0–36.0)
MCV: 94.1 fL (ref 80.0–100.0)
MONO ABS: 0.3 10*3/uL (ref 0.1–1.0)
Monocytes Relative: 7 %
Neutro Abs: 1.8 10*3/uL (ref 1.7–7.7)
Neutrophils Relative %: 40 %
Platelet Count: 181 10*3/uL (ref 150–400)
RBC: 4.23 MIL/uL (ref 3.87–5.11)
RDW: 12.9 % (ref 11.5–15.5)
WBC: 4.5 10*3/uL (ref 4.0–10.5)
nRBC: 0 % (ref 0.0–0.2)

## 2018-06-23 LAB — CMP (CANCER CENTER ONLY)
ALBUMIN: 4.4 g/dL (ref 3.5–5.0)
ALK PHOS: 106 U/L (ref 38–126)
ALT: 24 U/L (ref 0–44)
AST: 17 U/L (ref 15–41)
Anion gap: 8 (ref 5–15)
BUN: 16 mg/dL (ref 8–23)
CHLORIDE: 107 mmol/L (ref 98–111)
CO2: 28 mmol/L (ref 22–32)
Calcium: 9.7 mg/dL (ref 8.9–10.3)
Creatinine: 0.97 mg/dL (ref 0.44–1.00)
GFR, Est AFR Am: >60 mL/min (ref >60)
GFR, Estimated: >60 mL/min (ref >60)
Glucose, Bld: 98 mg/dL (ref 70–99)
Potassium: 4.1 mmol/L (ref 3.5–5.1)
SODIUM: 143 mmol/L (ref 135–145)
Total Bilirubin: 0.5 mg/dL (ref 0.3–1.2)
Total Protein: 7.1 g/dL (ref 6.5–8.1)

## 2018-06-25 ENCOUNTER — Ambulatory Visit: Payer: 59 | Admitting: Internal Medicine

## 2018-06-26 ENCOUNTER — Encounter (HOSPITAL_COMMUNITY): Payer: Self-pay

## 2018-06-26 ENCOUNTER — Ambulatory Visit (HOSPITAL_COMMUNITY)
Admission: RE | Admit: 2018-06-26 | Discharge: 2018-06-26 | Disposition: A | Discharge status: Home / Self Care | Payer: 59 | Source: Ambulatory Visit | Attending: Internal Medicine | Admitting: Internal Medicine

## 2018-06-26 DIAGNOSIS — C3490 Malignant neoplasm of unspecified part of unspecified bronchus or lung: Secondary | ICD-10-CM | POA: Insufficient documentation

## 2018-06-26 DIAGNOSIS — I700 Atherosclerosis of aorta: Secondary | ICD-10-CM | POA: Insufficient documentation

## 2018-06-26 DIAGNOSIS — R918 Other nonspecific abnormal finding of lung field: Secondary | ICD-10-CM | POA: Insufficient documentation

## 2018-06-26 DIAGNOSIS — D150 Benign neoplasm of thymus: Secondary | ICD-10-CM | POA: Diagnosis not present

## 2018-06-26 DIAGNOSIS — Z9889 Other specified postprocedural states: Secondary | ICD-10-CM | POA: Diagnosis not present

## 2018-06-26 DIAGNOSIS — Z85038 Personal history of other malignant neoplasm of large intestine: Secondary | ICD-10-CM | POA: Diagnosis not present

## 2018-06-26 DIAGNOSIS — Z86718 Personal history of other venous thrombosis and embolism: Secondary | ICD-10-CM | POA: Diagnosis not present

## 2018-06-26 MED ORDER — SODIUM CHLORIDE (PF) 0.9 % IJ SOLN
INTRAMUSCULAR | Status: AC
  Filled 2018-06-26: qty 50

## 2018-06-26 MED ORDER — IOHEXOL 300 MG/ML  SOLN
75.0000 mL | Freq: Once | INTRAMUSCULAR | Status: AC | PRN
  Administered 2018-06-26: 75 mL via INTRAVENOUS

## 2018-06-30 ENCOUNTER — Ambulatory Visit (INDEPENDENT_AMBULATORY_CARE_PROVIDER_SITE_OTHER): Payer: 59 | Admitting: Thoracic Surgery (Cardiothoracic Vascular Surgery)

## 2018-06-30 ENCOUNTER — Other Ambulatory Visit: Payer: Self-pay

## 2018-06-30 ENCOUNTER — Encounter: Payer: Self-pay | Admitting: Thoracic Surgery (Cardiothoracic Vascular Surgery)

## 2018-06-30 VITALS — BP 138/80 | HR 60 | Resp 16 | Ht 67.0 in | Wt 194.4 lb

## 2018-06-30 DIAGNOSIS — C37 Malignant neoplasm of thymus: Secondary | ICD-10-CM

## 2018-06-30 DIAGNOSIS — Z9089 Acquired absence of other organs: Secondary | ICD-10-CM

## 2018-06-30 NOTE — Progress Notes (Signed)
[name] [address]       [name],[name] [id number]             [phone]     HPI: [name] returns for a scheduled follow-up visit  [name] is a 62 year old [place] Torres who presented with atypical left-sided chest pain.  She was found to have an anterior mediastinal mass.  I did a thymectomy via a partial sternotomy on 11/24/2017.  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  It was contained with no invasion.  I last saw her in May.  She was still having a fair amount of incisional pain at that time.  Since then the pain is improved dramatically although she is noticed some aching in the area with the colder weather recently.  She is not having to take anything for that.  Overall she feels well.  She is back at work.  Past Medical History:  Diagnosis Date  . Cancer ([facility]) 08/03/2010   colon  . 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 HLD (hyperlipidemia)   . Hypertension   . Uterine fibroid     Current Outpatient Medications  Medication Sig Dispense Refill  . amLODipine (NORVASC) 5 MG tablet Take 1 tablet (5 mg total) by mouth daily. 90 tablet 3  . lisinopril (PRINIVIL,ZESTRIL) 40 MG tablet Take 1 tablet (40 mg total) by mouth daily. 90 tablet 3  . lovastatin (MEVACOR) 20 MG tablet Take 1 my mouth daily 90 tablet 3  . rivaroxaban (XARELTO) 20 MG TABS tablet Take 1 tablet (20 mg total) by mouth daily with supper. 30 tablet 6  . Vitamin D, Ergocalciferol, (DRISDOL) 50000 units CAPS capsule Take 1 capsule (50,000 Units total) by mouth every 7 (seven) days. 12 capsule 0   No current facility-administered medications for this visit.     Physical Exam BP 138/80 (BP Location: Right Arm, Patient Position: Sitting, Cuff Size: Large) Comment (Cuff Size): MANUALLY  Pulse 60   Resp 16   Ht 5\' 7"  (1.702 m)   Wt 194 lb 6.4 oz (88.2 kg)   SpO2 97% Comment: ON RA  BMI 30.28 kg/m  [name] in no  acute distress Alert and oriented x3 with no focal deficits Incision well-healed Cardiac regular rate and rhythm Lungs clear with equal breath sounds bilaterally  Diagnostic Tests: CT CHEST WITH CONTRAST  TECHNIQUE: Multidetector CT imaging of the chest was performed during intravenous contrast administration.  CONTRAST:  56mL OMNIPAQUE IOHEXOL 300 MG/ML  SOLN  COMPARISON:  09/01/2017  FINDINGS: Cardiovascular: Heart is normal in size.  No pericardial effusion.  No evidence of thoracic aortic aneurysm. Mild atherosclerotic calcifications of the aortic arch.  Mediastinum/Nodes: No suspicious mediastinal lymphadenopathy.  Surgical clips related to resection of prior anterior mediastinal mass.  Visualized thyroid is notable for 7 mm coarse right thyroid calcification.  Lungs/Pleura: Two left lower lobe pulmonary nodules measuring 3-4 mm (series 7/images 97 and 101), unchanged from January 2019, likely benign.  No new/suspicious pulmonary nodules.  No focal consolidation. Mild dependent atelectasis in the bilateral lower lobes.  No pleural effusion or pneumothorax.  Upper Abdomen: Visualized upper abdomen is notable for prior cholecystectomy.  Musculoskeletal: Mild degenerative changes of the visualized thoracolumbar spine. Median sternotomy.  IMPRESSION: Status post resection of prior anterior mediastinal mass.  No findings suspicious for recurrent or metastatic disease.  Two left lower lobe pulmonary nodules measuring 3-4 mm,  unchanged from January 2019, likely benign. Consider attention on follow-up as clinically warranted.  Aortic Atherosclerosis (ICD10-I70.0).   Electronically Signed   By: Julian Hy M.D.   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  She did well  postoperatively she continues to do well.  She did not require any adjuvant therapy.  She is being followed by Dr. Julien Nordmann from oncology.  She had a CT on November 15 which showed no evidence of recurrent disease.   Plan: Follow-up as scheduled with Dr. Julien Nordmann.  I will be happy to see her back anytime the future if I can be of any further assistance with her care  [name], MD [facility] [phone]

## 2018-07-02 ENCOUNTER — Telehealth: Payer: Self-pay | Admitting: Internal Medicine

## 2018-07-02 ENCOUNTER — Inpatient Hospital Stay (HOSPITAL_BASED_OUTPATIENT_CLINIC_OR_DEPARTMENT_OTHER): Payer: 59 | Admitting: Internal Medicine

## 2018-07-02 VITALS — BP 175/91 | HR 71 | Temp 98.6°F | Resp 16 | Wt 192.0 lb

## 2018-07-02 DIAGNOSIS — D150 Benign neoplasm of thymus: Secondary | ICD-10-CM | POA: Diagnosis not present

## 2018-07-02 DIAGNOSIS — C37 Malignant neoplasm of thymus: Secondary | ICD-10-CM

## 2018-07-02 DIAGNOSIS — I10 Essential (primary) hypertension: Secondary | ICD-10-CM

## 2018-07-02 DIAGNOSIS — Z79899 Other long term (current) drug therapy: Secondary | ICD-10-CM

## 2018-07-02 DIAGNOSIS — Z86718 Personal history of other venous thrombosis and embolism: Secondary | ICD-10-CM | POA: Diagnosis not present

## 2018-07-02 DIAGNOSIS — Z85038 Personal history of other malignant neoplasm of large intestine: Secondary | ICD-10-CM

## 2018-07-02 DIAGNOSIS — C186 Malignant neoplasm of descending colon: Secondary | ICD-10-CM

## 2018-07-02 NOTE — Telephone Encounter (Signed)
Gave pt avs and calendar  °

## 2018-07-02 NOTE — Progress Notes (Signed)
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  PRIOR THERAPY: Status post thymectomy under the care of Dr. Roxan Hockey on 11/24/2017  CURRENT THERAPY: Observation.  INTERVAL HISTORY: [name] 62 y.o. female returns to the clinic today for six-month follow-up visit.  The patient is feeling fine today with no concerning complaints.  She has been in observation for the last 9 months with no recent complaints.  She denied having any chest pain, shortness breath, cough or hemoptysis.  She denied having any recent weight loss or night sweats.  She has no nausea, vomiting, diarrhea or constipation.  She had a repeat CT scan of the chest performed recently and she is here for evaluation and discussion of her risk her results.  MEDICAL HISTORY: Past Medical History:  Diagnosis Date  . Cancer ([facility]) 08/03/2010   colon  . 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 HLD (hyperlipidemia)   . Hypertension   . Uterine fibroid     ALLERGIES:  has No Known Allergies.  MEDICATIONS:  Current Outpatient Medications  Medication Sig Dispense Refill  . amLODipine (NORVASC) 5 MG tablet Take 1 tablet (5 mg total) by mouth daily. 90 tablet 3  . lisinopril (PRINIVIL,ZESTRIL) 40 MG tablet Take 1 tablet (40 mg total) by mouth daily. 90 tablet 3  . lovastatin (MEVACOR) 20 MG tablet Take 1 my mouth daily 90 tablet 3  . rivaroxaban (XARELTO) 20 MG TABS tablet Take 1 tablet (20 mg total) by mouth daily with supper. 30 tablet 6  . Vitamin D, Ergocalciferol, (DRISDOL) 50000 units CAPS capsule Take 1 capsule (50,000 Units total) by mouth every 7 (seven) days. 12 capsule 0   No current facility-administered medications for this visit.     SURGICAL HISTORY:  Past Surgical  History:  Procedure Laterality Date  . ABDOMINAL HYSTERECTOMY    . CHOLECYSTECTOMY    . COLON SURGERY    . GALLBLADDER SURGERY    . MEDIASTERNOTOMY N/A 11/24/2017   Procedure: PARTIAL STERNOTOMY;  Surgeon: [name], MD;  Location: [facility];  Service: Thoracic;  Laterality: N/A;  . THYMECTOMY  11/24/2017   Procedure: THYMECTOMY;  Surgeon: [name], MD;  Location: [facility];  Service: Thoracic;;    REVIEW OF SYSTEMS:  A comprehensive review of systems was negative.   PHYSICAL EXAMINATION: General appearance: alert, cooperative and no distress Head: Normocephalic, without obvious abnormality, atraumatic Neck: no adenopathy, no JVD, supple, symmetrical, trachea midline and thyroid not enlarged, symmetric, no tenderness/mass/nodules Lymph nodes: Cervical, supraclavicular, and axillary nodes normal. Resp: clear to auscultation bilaterally Back: symmetric, no curvature. ROM normal. No CVA tenderness. Cardio: regular rate and rhythm, S1, S2 normal, no murmur, click, rub or gallop GI: soft, non-tender; bowel sounds normal; no masses,  no organomegaly Extremities: extremities normal, atraumatic, no cyanosis or edema  ECOG PERFORMANCE STATUS: 1 - Symptomatic but completely ambulatory  Blood pressure (!) 175/91, pulse 71, temperature 98.6 F (37 C), temperature source Oral, resp. rate 16, weight 192 lb (87.1 kg), SpO2 99 %.  LABORATORY DATA: Lab Results  Component Value Date   WBC 4.5 06/23/2018   HGB 12.9 06/23/2018   HCT 39.8 06/23/2018   MCV 94.1 06/23/2018   PLT 181 06/23/2018      Chemistry  Component Value Date/Time   NA 143 06/23/2018 1622   NA 145 (H) 11/11/2017 1305   NA 144 09/01/2015 1421   K 4.1 06/23/2018 1622   K 3.6 09/01/2015 1421   CL 107 06/23/2018 1622   CL 106 08/26/2012 0905   CO2 28 06/23/2018 1622   CO2 28 09/01/2015 1421   BUN 16 06/23/2018 1622   BUN 11 11/11/2017 1305   BUN 22.0 09/01/2015 1421   CREATININE 0.97 06/23/2018 1622     CREATININE 1.1 09/01/2015 1421      Component Value Date/Time   CALCIUM 9.7 06/23/2018 1622   CALCIUM 9.3 09/01/2015 1421   ALKPHOS 106 06/23/2018 1622   ALKPHOS 89 09/01/2015 1421   AST 17 06/23/2018 1622   AST 19 09/01/2015 1421   ALT 24 06/23/2018 1622   ALT 22 09/01/2015 1421   BILITOT 0.5 06/23/2018 1622   BILITOT 0.55 09/01/2015 1421       RADIOGRAPHIC STUDIES: Ct Chest W Contrast  Result Date: 06/26/2018 CLINICAL DATA:  Thymoma, status post thymectomy in 2019. History of colon cancer in 2011. EXAM: CT CHEST WITH CONTRAST TECHNIQUE: Multidetector CT imaging of the chest was performed during intravenous contrast administration. CONTRAST:  54mL OMNIPAQUE IOHEXOL 300 MG/ML  SOLN COMPARISON:  09/01/2017 FINDINGS: Cardiovascular: Heart is normal in size.  No pericardial effusion. No evidence of thoracic aortic aneurysm. Mild atherosclerotic calcifications of the aortic arch. Mediastinum/Nodes: No suspicious mediastinal lymphadenopathy. Surgical clips related to resection of prior anterior mediastinal mass. Visualized thyroid is notable for 7 mm coarse right thyroid calcification. Lungs/Pleura: Two left lower lobe pulmonary nodules measuring 3-4 mm (series 7/images 97 and 101), unchanged from January 2019, likely benign. No new/suspicious pulmonary nodules. No focal consolidation. Mild dependent atelectasis in the bilateral lower lobes. No pleural effusion or pneumothorax. Upper Abdomen: Visualized upper abdomen is notable for prior cholecystectomy. Musculoskeletal: Mild degenerative changes of the visualized thoracolumbar spine. Median sternotomy. IMPRESSION: Status post resection of prior anterior mediastinal mass. No findings suspicious for recurrent or metastatic disease. Two left lower lobe pulmonary nodules measuring 3-4 mm, unchanged from January 2019, likely benign. Consider attention on follow-up as clinically warranted. Aortic Atherosclerosis (ICD10-I70.0). Electronically Signed    By: Julian Hy M.D.   On: 06/26/2018 15:28    ASSESSMENT AND PLAN: This is a very pleasant 62 years old white female with history of a stage I thymoma status post surgical resection.  She is currently on observation and she is feeling fine.  Repeat CT scan of the chest showed no concerning findings for disease recurrence. I discussed the scan results with the patient and recommended for her to continue in observation with repeat CT scan of the chest in 6 months. For hypertension I strongly encouraged her to take her blood pressure medication as prescribed and to discuss with her primary care physician for adjustment of her medication if needed. The patient was advised to call immediately if she has any concerning symptoms in the interval. The patient voices understanding of current disease status and treatment options and is in agreement with the current care plan.  All questions were answered. The patient knows to call the clinic with any problems, questions or concerns. We can certainly see the patient much sooner if necessary.  I spent 10 minutes counseling the patient face to face. The total time spent in the appointment was 15 minutes.  Disclaimer: This note was dictated with voice recognition software. Similar sounding words can inadvertently be transcribed and may not  be corrected upon review.

## 2018-07-03 ENCOUNTER — Encounter: Payer: Self-pay | Admitting: Internal Medicine

## 2018-08-28 DIAGNOSIS — G5601 Carpal tunnel syndrome, right upper limb: Secondary | ICD-10-CM | POA: Diagnosis not present

## 2018-09-22 DIAGNOSIS — M542 Cervicalgia: Secondary | ICD-10-CM | POA: Diagnosis not present

## 2018-09-22 DIAGNOSIS — G5601 Carpal tunnel syndrome, right upper limb: Secondary | ICD-10-CM | POA: Diagnosis not present

## 2018-09-22 DIAGNOSIS — M4722 Other spondylosis with radiculopathy, cervical region: Secondary | ICD-10-CM | POA: Diagnosis not present

## 2018-09-22 DIAGNOSIS — R222 Localized swelling, mass and lump, trunk: Secondary | ICD-10-CM | POA: Diagnosis not present

## 2018-09-28 ENCOUNTER — Other Ambulatory Visit: Payer: Self-pay | Admitting: Orthopedic Surgery

## 2018-09-28 DIAGNOSIS — R222 Localized swelling, mass and lump, trunk: Secondary | ICD-10-CM

## 2018-09-28 DIAGNOSIS — M542 Cervicalgia: Secondary | ICD-10-CM

## 2018-10-06 ENCOUNTER — Other Ambulatory Visit: Payer: Self-pay | Admitting: Physician Assistant

## 2018-10-06 DIAGNOSIS — M542 Cervicalgia: Secondary | ICD-10-CM

## 2018-10-06 DIAGNOSIS — R222 Localized swelling, mass and lump, trunk: Secondary | ICD-10-CM

## 2018-10-13 ENCOUNTER — Other Ambulatory Visit: Payer: Self-pay | Admitting: Family Medicine

## 2018-10-13 DIAGNOSIS — Z7901 Long term (current) use of anticoagulants: Secondary | ICD-10-CM

## 2018-10-15 ENCOUNTER — Ambulatory Visit
Admission: RE | Admit: 2018-10-15 | Discharge: 2018-10-15 | Disposition: A | Discharge status: Home / Self Care | Payer: 59 | Source: Ambulatory Visit | Attending: Physician Assistant | Admitting: Physician Assistant

## 2018-10-15 ENCOUNTER — Other Ambulatory Visit: Payer: 59

## 2018-10-15 ENCOUNTER — Ambulatory Visit
Admission: RE | Admit: 2018-10-15 | Discharge: 2018-10-15 | Disposition: A | Discharge status: Home / Self Care | Payer: 59 | Source: Ambulatory Visit | Attending: Orthopedic Surgery | Admitting: Orthopedic Surgery

## 2018-10-15 DIAGNOSIS — M4802 Spinal stenosis, cervical region: Secondary | ICD-10-CM | POA: Diagnosis not present

## 2018-10-15 DIAGNOSIS — R222 Localized swelling, mass and lump, trunk: Secondary | ICD-10-CM

## 2018-10-15 DIAGNOSIS — I517 Cardiomegaly: Secondary | ICD-10-CM | POA: Diagnosis not present

## 2018-10-15 DIAGNOSIS — M542 Cervicalgia: Secondary | ICD-10-CM

## 2018-10-15 MED ORDER — GADOBENATE DIMEGLUMINE 529 MG/ML IV SOLN
18.0000 mL | Freq: Once | INTRAVENOUS | Status: AC | PRN
  Administered 2018-10-15: 18 mL via INTRAVENOUS

## 2018-11-24 IMAGING — DX DG CHEST 1V PORT
1 series · 1 of 1 positions shown · non-contrast
Comparison: Study obtained earlier in the day

CLINICAL DATA: Central catheter placement

EXAM:
PORTABLE CHEST 1 VIEW

[chest ap]
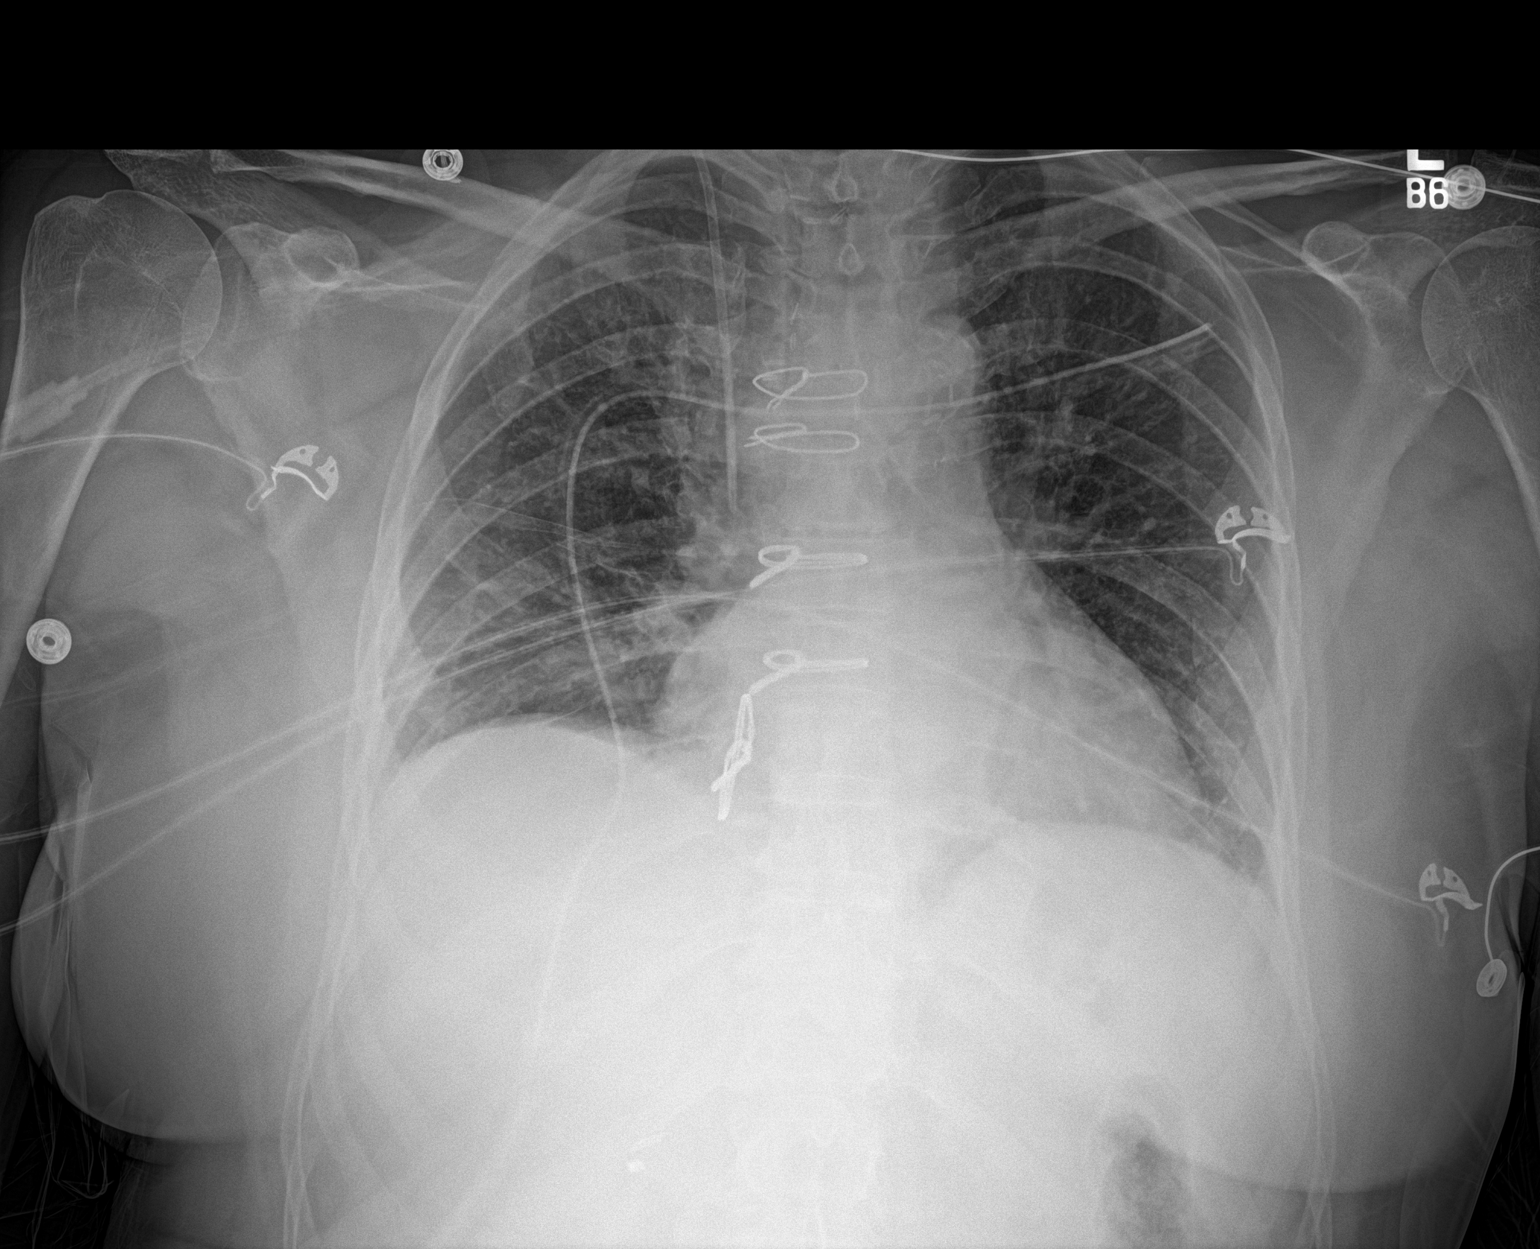

[1 of 1 positions shown; findings below may reference images not displayed]

FINDINGS: Patient is status post median sternotomy. Central catheter tip is in
the superior vena cava. There is a chest tube with tip overlying the
lateral upper left hemithorax. No pneumothorax. There is no evident
edema or consolidation. Heart size and pulmonary vascularity are
normal. No adenopathy. There is aortic atherosclerosis. No evident
bone lesions.
IMPRESSION: Tube and catheter positions as described without pneumothorax. No
edema or consolidation. Heart size normal. There is aortic
atherosclerosis.

Aortic Atherosclerosis ([id number].J).

## 2018-11-25 IMAGING — DX DG CHEST 1V PORT
1 series · 1 of 1 positions shown · non-contrast
Comparison: 11/24/2017

CLINICAL DATA: Post thymectomy.  Chest tube.

EXAM:
PORTABLE CHEST 1 VIEW

[chest ap]
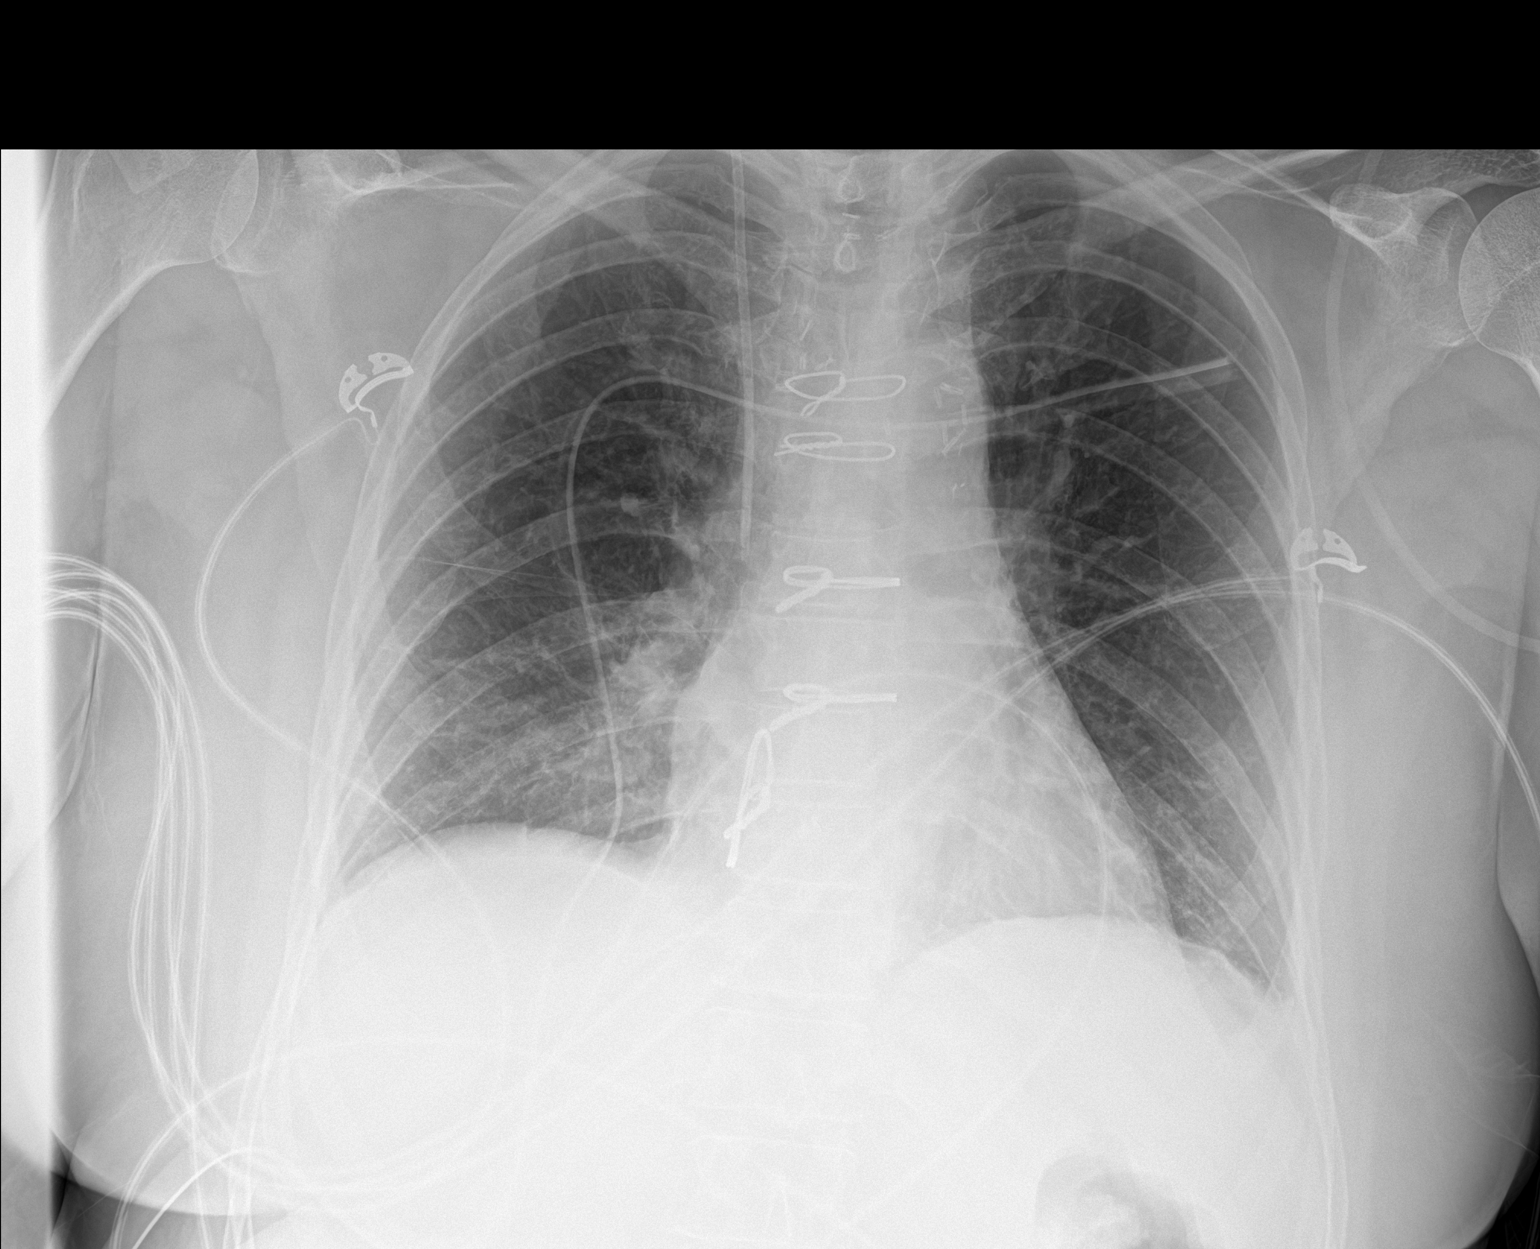

[1 of 1 positions shown; findings below may reference images not displayed]

FINDINGS: Left chest tube noted. No pneumothorax. Prior median sternotomy.
Heart is normal size. Bibasilar atelectasis.
IMPRESSION: Left chest tube without pneumothorax.

Bibasilar atelectasis.

## 2018-11-26 IMAGING — DX DG CHEST 1V PORT
1 series · 1 of 1 positions shown · non-contrast
Comparison: Portable chest x-ray of 11/25/2017

CLINICAL DATA: Post mediasternectomy for thymectomy

EXAM:
PORTABLE CHEST 1 VIEW

[chest ap]
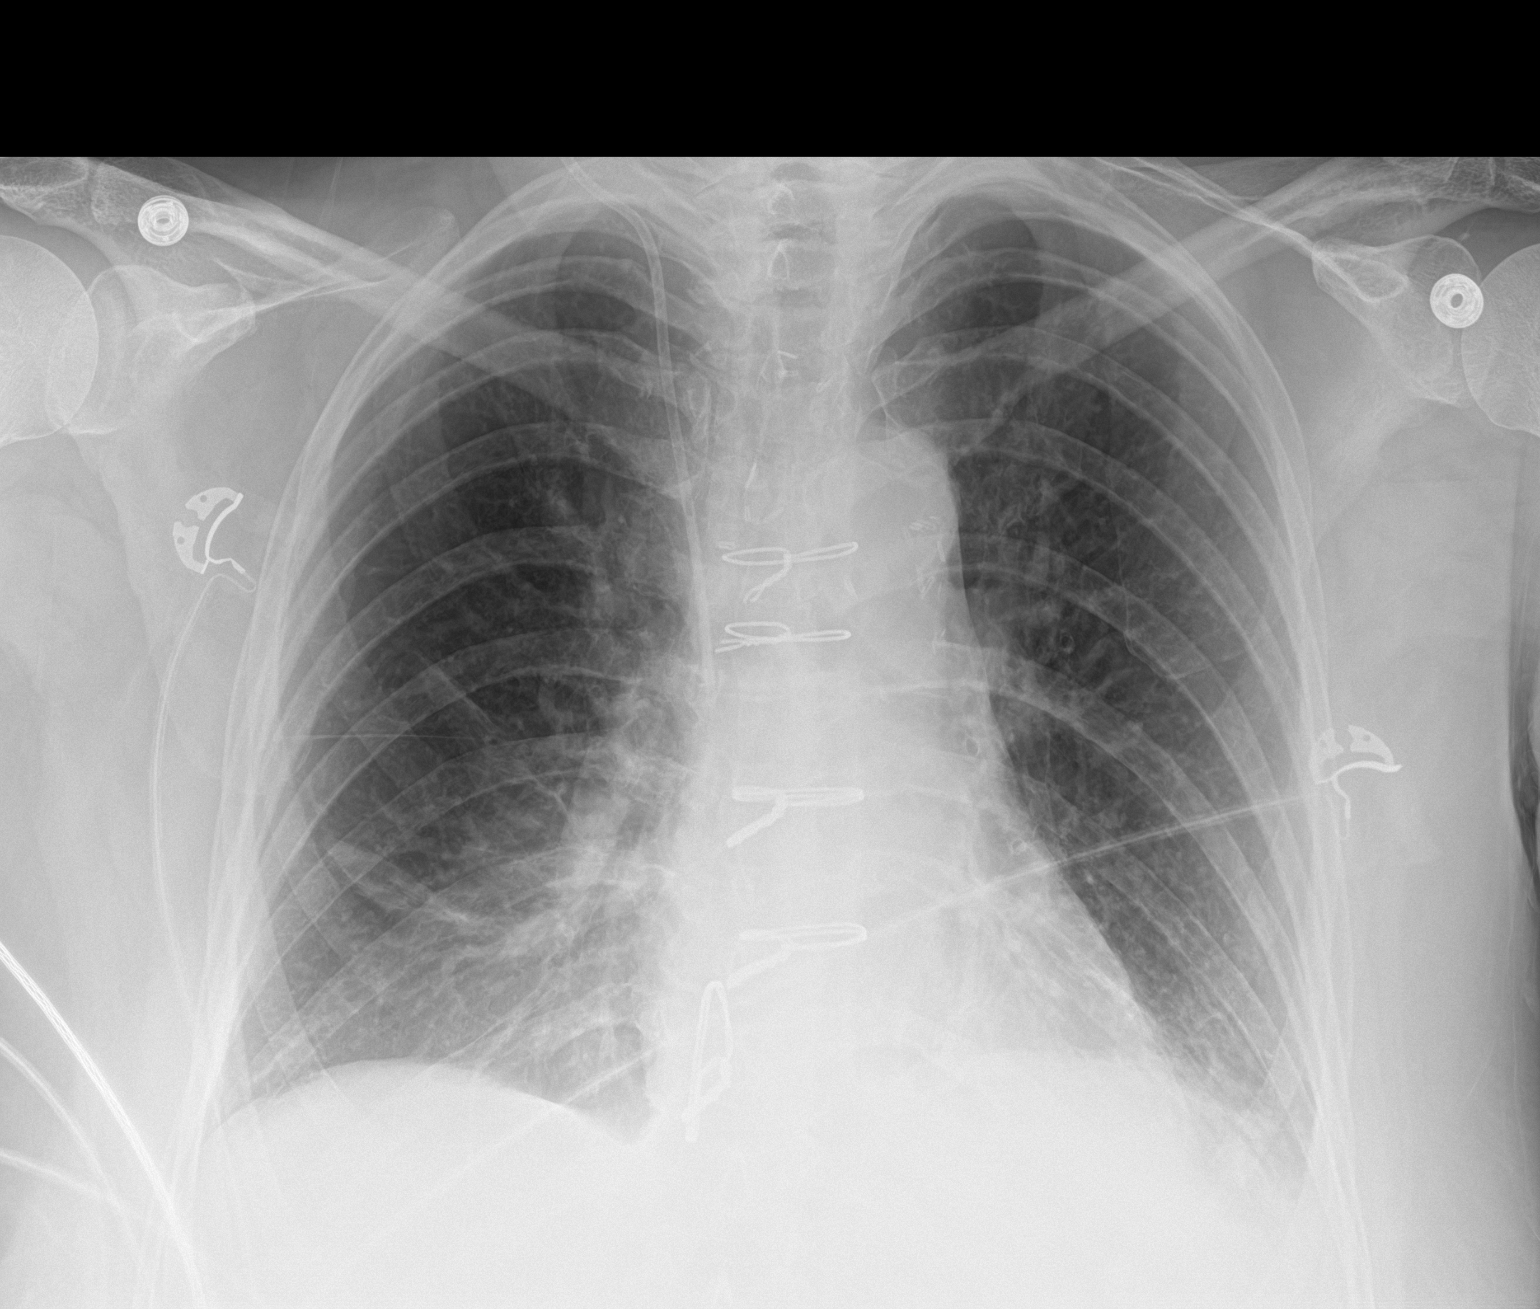

[1 of 1 positions shown; findings below may reference images not displayed]

FINDINGS: There is mild bibasilar atelectasis present and small pleural
effusions are difficult to exclude. Right IJ central venous line tip
overlies the mid SVC. Median sternotomy sutures are noted.
Mediastinal and hilar contours are unremarkable and the heart is
unchanged in size being within normal limits. No bony abnormality is
noted.
IMPRESSION: 1. Bibasilar atelectasis.
2. Cannot exclude small pleural effusions.
3. Right IJ central venous line tip overlies the SVC.

## 2018-12-09 IMAGING — CR DG CHEST 2V
2 series · 2 of 2 positions shown · non-contrast
Comparison: Portable chest x-ray November 26, 2017

CLINICAL DATA: Patient underwent thymectomy on November 24, 2017. The
patient reports chest discomfort and shortness of breath. Nonsmoker.
History of colonic malignancy.

EXAM:
CHEST - 2 VIEW

[w chest pa]
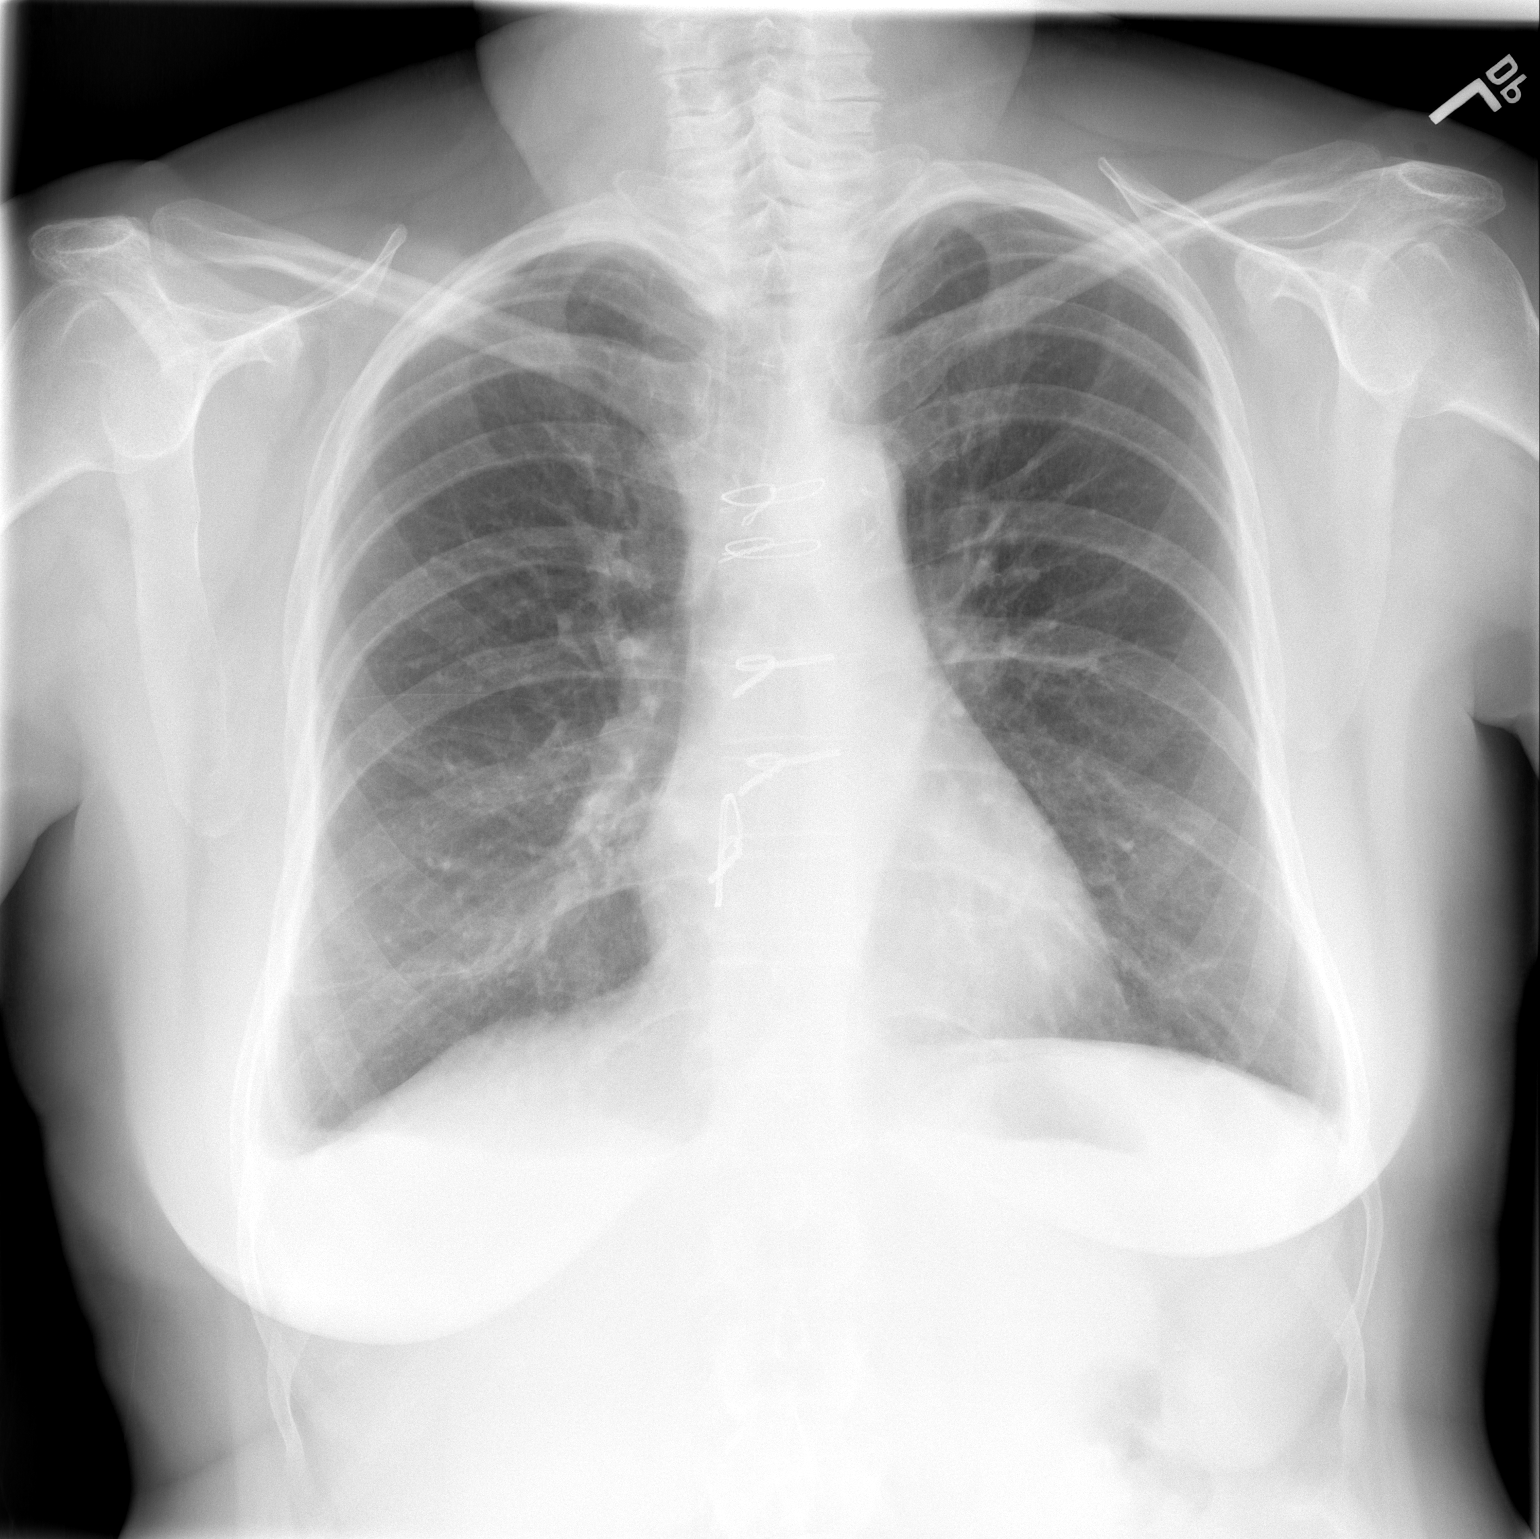

[w chest lat]
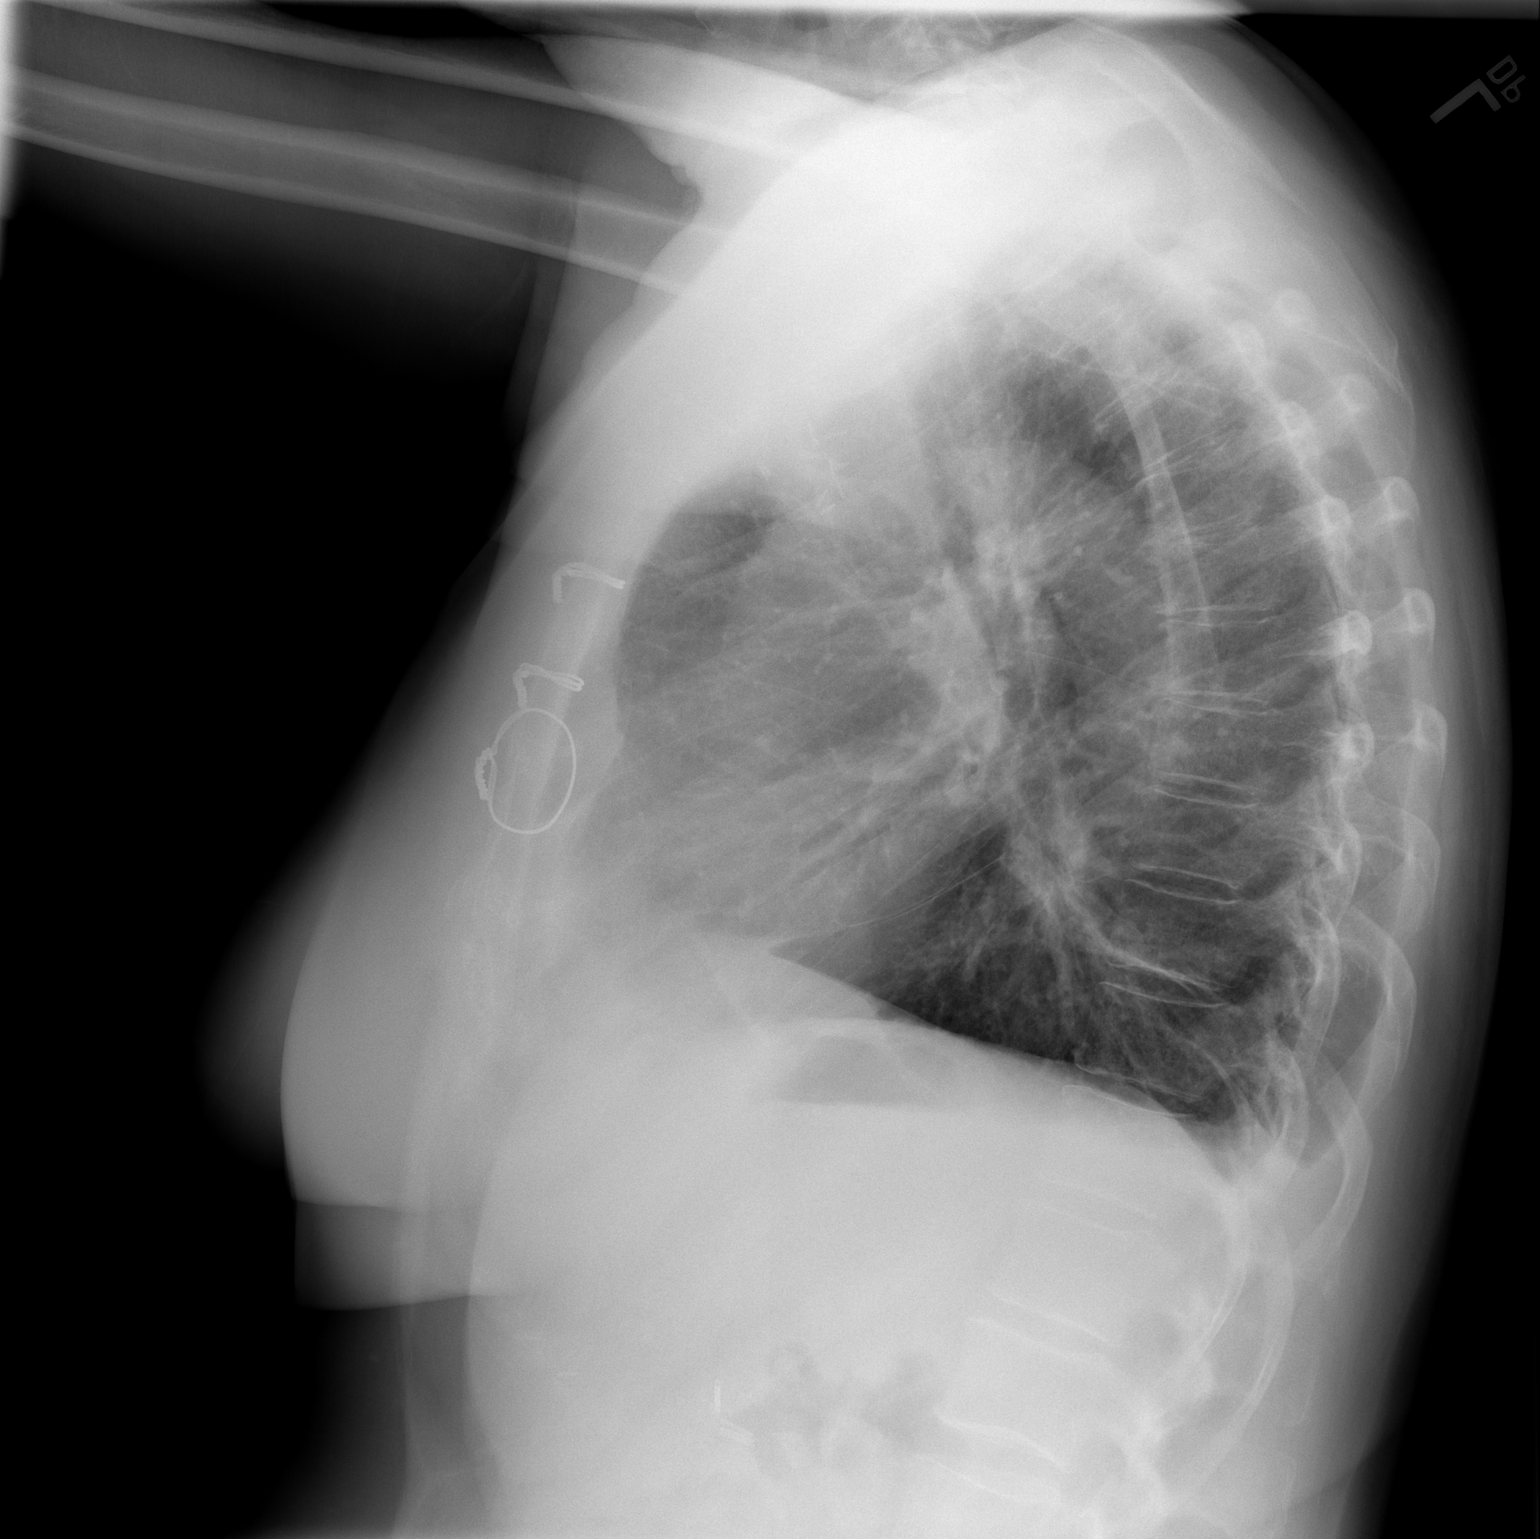

[2 of 2 positions shown; findings below may reference images not displayed]

FINDINGS: The lungs are well-expanded. There is a small amount of pleural
fluid blunting the costophrenic angles greatest on the right. The
lung markings are coarse in the right infrahilar region though this
is stable. The heart and pulmonary vascularity are normal. There is
calcification in the wall of the aortic arch. The sternal wires are
intact. The retrosternal soft tissues exhibit mild thickening
inferiorly. The bony thorax exhibits no acute abnormality.
IMPRESSION: Small bilateral pleural effusions. No discrete infiltrate. Mild
retrosternal soft tissue fullness inferiorly.

Thoracic aortic atherosclerosis.

## 2018-12-25 ENCOUNTER — Telehealth: Payer: Self-pay | Admitting: Internal Medicine

## 2018-12-25 NOTE — Telephone Encounter (Signed)
Called patient per 5/18 sch message - no answer  Left message for patient to call back for reschedule.

## 2018-12-28 ENCOUNTER — Other Ambulatory Visit: Payer: Self-pay

## 2018-12-28 ENCOUNTER — Inpatient Hospital Stay: Payer: 59 | Attending: Internal Medicine

## 2018-12-28 ENCOUNTER — Inpatient Hospital Stay: Payer: 59

## 2018-12-28 ENCOUNTER — Ambulatory Visit (HOSPITAL_COMMUNITY): Payer: 59

## 2018-12-28 DIAGNOSIS — I10 Essential (primary) hypertension: Secondary | ICD-10-CM | POA: Insufficient documentation

## 2018-12-28 DIAGNOSIS — E785 Hyperlipidemia, unspecified: Secondary | ICD-10-CM | POA: Insufficient documentation

## 2018-12-28 DIAGNOSIS — Z86718 Personal history of other venous thrombosis and embolism: Secondary | ICD-10-CM | POA: Diagnosis not present

## 2018-12-28 DIAGNOSIS — Z79899 Other long term (current) drug therapy: Secondary | ICD-10-CM | POA: Diagnosis not present

## 2018-12-28 DIAGNOSIS — D150 Benign neoplasm of thymus: Secondary | ICD-10-CM | POA: Diagnosis not present

## 2018-12-28 DIAGNOSIS — Z85038 Personal history of other malignant neoplasm of large intestine: Secondary | ICD-10-CM | POA: Diagnosis not present

## 2018-12-28 DIAGNOSIS — C37 Malignant neoplasm of thymus: Secondary | ICD-10-CM

## 2018-12-28 DIAGNOSIS — Z7901 Long term (current) use of anticoagulants: Secondary | ICD-10-CM | POA: Diagnosis not present

## 2018-12-28 LAB — CBC WITH DIFFERENTIAL (CANCER CENTER ONLY)
Abs Immature Granulocytes: 0.01 10*3/uL (ref 0.00–0.07)
Basophils Absolute: 0.1 10*3/uL (ref 0.0–0.1)
Basophils Relative: 1 %
Eosinophils Absolute: 0.1 10*3/uL (ref 0.0–0.5)
Eosinophils Relative: 1 %
HCT: 38.3 % (ref 36.0–46.0)
Hemoglobin: 12.6 g/dL (ref 12.0–15.0)
Immature Granulocytes: 0 %
Lymphocytes Relative: 43 %
Lymphs Abs: 1.8 10*3/uL (ref 0.7–4.0)
MCH: 31.1 pg (ref 26.0–34.0)
MCHC: 32.9 g/dL (ref 30.0–36.0)
MCV: 94.6 fL (ref 80.0–100.0)
Monocytes Absolute: 0.3 10*3/uL (ref 0.1–1.0)
Monocytes Relative: 7 %
Neutro Abs: 2 10*3/uL (ref 1.7–7.7)
Neutrophils Relative %: 48 %
Platelet Count: 164 10*3/uL (ref 150–400)
RBC: 4.05 MIL/uL (ref 3.87–5.11)
RDW: 12.4 % (ref 11.5–15.5)
WBC Count: 4.3 10*3/uL (ref 4.0–10.5)
nRBC: 0 % (ref 0.0–0.2)

## 2018-12-28 LAB — CMP (CANCER CENTER ONLY)
ALT: 19 U/L (ref 0–44)
AST: 21 U/L (ref 15–41)
Albumin: 4.2 g/dL (ref 3.5–5.0)
Alkaline Phosphatase: 99 U/L (ref 38–126)
Anion gap: 9 (ref 5–15)
BUN: 15 mg/dL (ref 8–23)
CO2: 28 mmol/L (ref 22–32)
Calcium: 8.6 mg/dL — ABNORMAL LOW (ref 8.9–10.3)
Chloride: 106 mmol/L (ref 98–111)
Creatinine: 1.08 mg/dL — ABNORMAL HIGH (ref 0.44–1.00)
GFR, Est AFR Am: >60 mL/min (ref >60)
GFR, Estimated: 54 mL/min — ABNORMAL LOW (ref >60)
Glucose, Bld: 176 mg/dL — ABNORMAL HIGH (ref 70–99)
Potassium: 3.9 mmol/L (ref 3.5–5.1)
Sodium: 143 mmol/L (ref 135–145)
Total Bilirubin: 0.4 mg/dL (ref 0.3–1.2)
Total Protein: 6.6 g/dL (ref 6.5–8.1)

## 2019-01-05 ENCOUNTER — Inpatient Hospital Stay (HOSPITAL_BASED_OUTPATIENT_CLINIC_OR_DEPARTMENT_OTHER): Payer: 59 | Admitting: Internal Medicine

## 2019-01-05 ENCOUNTER — Encounter: Payer: Self-pay | Admitting: Internal Medicine

## 2019-01-05 ENCOUNTER — Other Ambulatory Visit: Payer: Self-pay

## 2019-01-05 VITALS — BP 193/96 | HR 66 | Temp 98.5°F | Resp 20 | Ht 67.0 in | Wt 199.4 lb

## 2019-01-05 DIAGNOSIS — I10 Essential (primary) hypertension: Secondary | ICD-10-CM

## 2019-01-05 DIAGNOSIS — Z85038 Personal history of other malignant neoplasm of large intestine: Secondary | ICD-10-CM | POA: Diagnosis not present

## 2019-01-05 DIAGNOSIS — Z9089 Acquired absence of other organs: Secondary | ICD-10-CM

## 2019-01-05 DIAGNOSIS — Z79899 Other long term (current) drug therapy: Secondary | ICD-10-CM

## 2019-01-05 DIAGNOSIS — D150 Benign neoplasm of thymus: Secondary | ICD-10-CM | POA: Diagnosis not present

## 2019-01-05 DIAGNOSIS — Z86718 Personal history of other venous thrombosis and embolism: Secondary | ICD-10-CM

## 2019-01-05 DIAGNOSIS — Z7901 Long term (current) use of anticoagulants: Secondary | ICD-10-CM

## 2019-01-05 MED ORDER — CLONIDINE HCL 0.1 MG PO TABS
0.2000 mg | ORAL_TABLET | Freq: Once | ORAL | Status: AC
  Administered 2019-01-05: 0.2 mg via ORAL

## 2019-01-05 MED ORDER — CLONIDINE HCL 0.1 MG PO TABS
ORAL_TABLET | ORAL | Status: AC
  Filled 2019-01-05: qty 2

## 2019-01-05 NOTE — Progress Notes (Signed)
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  PRIOR THERAPY: Status post thymectomy under the care of Dr. Roxan Hockey on 11/24/2017  CURRENT THERAPY: Observation.  INTERVAL HISTORY: [name] 63 y.o. female returns to the clinic today for follow-up visit.  The patient is feeling fine today with no concerning complaints except for the uncontrolled hypertension.  She is followed by Dr. Lorelei Pont and she is currently on Norvasc 5 mg p.o. daily in addition to lisinopril.  She denied having any chest pain, shortness of breath, cough or hemoptysis.  She denied having any fever or chills.  She has no nausea, vomiting, diarrhea or constipation.  She has no recent weight loss or night sweats.  She has no headache or visual changes.  She was supposed to have repeat CT scan of the chest before this visit but the patient did not have the scan as ordered.  She had MRI of the chest few months ago.  MEDICAL HISTORY: Past Medical History:  Diagnosis Date  . Cancer ([facility]) 08/03/2010   colon  . 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 HLD (hyperlipidemia)   . Hypertension   . Uterine fibroid     ALLERGIES:  has No Known Allergies.  MEDICATIONS:  Current Outpatient Medications  Medication Sig Dispense Refill  . amLODipine (NORVASC) 5 MG tablet Take 1 tablet (5 mg total) by mouth daily. 90 tablet 3  . lisinopril (PRINIVIL,ZESTRIL) 40 MG tablet Take 1 tablet (40 mg total) by mouth daily. 90 tablet 3  . lovastatin (MEVACOR) 20 MG tablet Take 1 my mouth daily 90 tablet 3  . Vitamin D, Ergocalciferol, (DRISDOL) 50000 units CAPS capsule Take 1 capsule (50,000 Units total) by mouth every 7 (seven) days. 12 capsule 0  . XARELTO 20 MG TABS tablet TAKE 1 TABLET BY MOUTH  DAILY WITH SUPPER 30 tablet 6   No current facility-administered medications for this visit.     SURGICAL HISTORY:  Past Surgical History:  Procedure Laterality Date  . ABDOMINAL HYSTERECTOMY    . CHOLECYSTECTOMY    . COLON SURGERY    . GALLBLADDER SURGERY    . MEDIASTERNOTOMY N/A 11/24/2017   Procedure: PARTIAL STERNOTOMY;  Surgeon: [name], MD;  Location: [facility];  Service: Thoracic;  Laterality: N/A;  . THYMECTOMY  11/24/2017   Procedure: THYMECTOMY;  Surgeon: [name], MD;  Location: [facility];  Service: Thoracic;;    REVIEW OF SYSTEMS:  A comprehensive review of systems was negative.   PHYSICAL EXAMINATION: General appearance: alert, cooperative and no distress Head: Normocephalic, without obvious abnormality, atraumatic Neck: no adenopathy, no JVD, supple, symmetrical, trachea midline and thyroid not enlarged, symmetric, no tenderness/mass/nodules Lymph nodes: Cervical, supraclavicular, and axillary nodes normal. Resp: clear to auscultation bilaterally Back: symmetric, no curvature. ROM normal. No CVA tenderness. Cardio: regular rate and rhythm, S1, S2 normal, no murmur, click, rub or gallop GI: soft, non-tender; bowel sounds normal; no masses,  no organomegaly Extremities: extremities normal, atraumatic, no cyanosis or edema  ECOG PERFORMANCE STATUS: 1 - Symptomatic but completely ambulatory  Blood pressure (!) 193/96, pulse 66, temperature 98.5 F (36.9 C), temperature source Oral, resp. rate 20, height 5\' 7"  (1.702 m), weight 199 lb 6.4 oz (90.4 kg), SpO2 100 %.  LABORATORY DATA: Lab Results  Component Value Date   WBC 4.3 12/28/2018   HGB 12.6 12/28/2018   HCT 38.3 12/28/2018   MCV 94.6 12/28/2018   PLT 164 12/28/2018      Chemistry      Component Value Date/Time   NA 143 12/28/2018 1607   NA 145 (H) 11/11/2017 1305   NA 144 09/01/2015 1421   K 3.9 12/28/2018 1607   K 3.6 09/01/2015 1421   CL 106 12/28/2018 1607   CL 106 08/26/2012  0905   CO2 28 12/28/2018 1607   CO2 28 09/01/2015 1421   BUN 15 12/28/2018 1607   BUN 11 11/11/2017 1305   BUN 22.0 09/01/2015 1421   CREATININE 1.08 (H) 12/28/2018 1607   CREATININE 1.1 09/01/2015 1421      Component Value Date/Time   CALCIUM 8.6 (L) 12/28/2018 1607   CALCIUM 9.3 09/01/2015 1421   ALKPHOS 99 12/28/2018 1607   ALKPHOS 89 09/01/2015 1421   AST 21 12/28/2018 1607   AST 19 09/01/2015 1421   ALT 19 12/28/2018 1607   ALT 22 09/01/2015 1421   BILITOT 0.4 12/28/2018 1607   BILITOT 0.55 09/01/2015 1421       RADIOGRAPHIC STUDIES: No results found.  ASSESSMENT AND PLAN: This is a very pleasant 63 years old white female with history of a stage I thymoma status post surgical resection.  The patient is currently on observation and she is feeling fine. MRI of the chest in March 2020 showed no concerning findings for recurrence of her disease. I recommended for her to continue on observation with repeat CT scan of the chest and 6 months. For the hypertension, I will give the patient a dose of clonidine 0.2 mg x 1 today.  She was advised to reach out to her primary care physician Dr. Edilia Bo for adjustment of her medication. She was advised to call immediately if she has any concerning symptoms in the interval. The patient voices understanding of current disease status and treatment options and is in agreement with the current care plan.  All questions were answered. The patient knows to call the clinic with any problems, questions or concerns. We can certainly see the patient much sooner if necessary.  I spent 10 minutes counseling the patient face to face. The total time spent in the appointment was 15 minutes.  Disclaimer: This note was dictated with voice recognition software. Similar sounding words can inadvertently be transcribed and may not be corrected upon review.

## 2019-01-06 ENCOUNTER — Telehealth: Payer: Self-pay | Admitting: Internal Medicine

## 2019-01-06 NOTE — Telephone Encounter (Signed)
Scheduled appt per 5/26 los - pt to get an updated schedule in the mail . F/u in 6 months.

## 2019-01-07 ENCOUNTER — Other Ambulatory Visit: Payer: Self-pay

## 2019-01-07 ENCOUNTER — Encounter: Payer: Self-pay | Admitting: Family Medicine

## 2019-01-07 ENCOUNTER — Ambulatory Visit (INDEPENDENT_AMBULATORY_CARE_PROVIDER_SITE_OTHER): Payer: 59 | Admitting: Family Medicine

## 2019-01-07 DIAGNOSIS — I10 Essential (primary) hypertension: Secondary | ICD-10-CM | POA: Diagnosis not present

## 2019-01-07 MED ORDER — AMLODIPINE BESYLATE 10 MG PO TABS: 10.0000 mg | ORAL_TABLET | Freq: Every day | ORAL | 3 refills | Status: DC

## 2019-01-07 NOTE — Progress Notes (Signed)
[place] at [facility] [address], [place], [place] [address] [phone]  Date:  1/60/1093   Name:  [name]   DOB:  [birth year]   MRN:  [id number]  PCP:  Darreld Mclean, MD    Chief Complaint: No chief complaint on file.   History of Present Illness:  [name] is a 63 y.o. very pleasant female patient who presents with the following:  Virtual follow-up visit today- telephone visit today  Patient location is home Provider location is office Pt ID confirmed with her name   She is now back at work-she was out for about a month due to pandemic  She was seen by her oncologist 2 days ago for follow-up visit. Her blood pressure is noted to be quite high She had history of colon cancer several years ago.  History of DVT on Xarelto More recently she was diagnosed with a stage I thymoma, status post surgical resection in April last year  2 days ago her oncologist, Dr. Julien Nordmann, noted that her blood pressure was quite high in clinic.  He gave her 0.2 mg of clonidine and asked her to follow-up with me for her blood pressure.  I last saw her in the office in September-at that time she had stopped several of her medications including amlodipine, hydrochlorothiazide, spironolactone, and her blood pressure was high.  Added back her amlodipine and lisinopril at that time  She is currently taking amlodipine 5, lisinopril 40, lovastatin, Xarelto 20 Recent labs show minimally reduced renal function with a GFR of 54  BP Readings from Last 3 Encounters:  01/05/19 (!) 193/96  07/02/18 (!) 175/91  06/30/18 138/80     Patient Active Problem List   Diagnosis Date Noted  . 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 Bilateral leg pain 07/02/2016  . History of DVT of lower extremity 05/31/2016  . History of colon cancer, stage I 08/17/2015  . Varicose 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 08/26/2014  . HTN (hypertension) 12/24/2013   . Cancer of left colon ([place]) 02/25/2012  . DVT (deep venous thrombosis) ([place]) 08/27/2011    Past Medical History:  Diagnosis Date  . Cancer ([place]) 08/03/2010   colon  . 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 HLD (hyperlipidemia)   . Hypertension   . Uterine fibroid     Past Surgical History:  Procedure Laterality Date  . ABDOMINAL HYSTERECTOMY    . CHOLECYSTECTOMY    . COLON SURGERY    . GALLBLADDER SURGERY    . MEDIASTERNOTOMY N/A 11/24/2017   Procedure: PARTIAL STERNOTOMY;  Surgeon: [name], MD;  Location: [facility];  Service: Thoracic;  Laterality: N/A;  . THYMECTOMY  11/24/2017   Procedure: THYMECTOMY;  Surgeon: [name], MD;  Location: [facility] OR;  Service: Thoracic;;    Social History   Tobacco Use  . Smoking status: Never Smoker  . Smokeless tobacco: Never Used  Substance Use Topics  . Alcohol use: No    Alcohol/week: 0.0 standard drinks  . Drug use: No    Family History  Problem Relation Age of Onset  . Heart attack Mother   . Hypertension Father     No Known Allergies  Medication list has been reviewed and updated.  Current Outpatient Medications on File Prior to Visit  Medication Sig Dispense Refill  . amLODipine (NORVASC) 5 MG tablet Take 1 tablet (5  mg total) by mouth daily. 90 tablet 3  . lisinopril (PRINIVIL,ZESTRIL) 40 MG tablet Take 1 tablet (40 mg total) by mouth daily. 90 tablet 3  . lovastatin (MEVACOR) 20 MG tablet Take 1 my mouth daily 90 tablet 3  . Vitamin D, Ergocalciferol, (DRISDOL) 50000 units CAPS capsule Take 1 capsule (50,000 Units total) by mouth every 7 (seven) days. 12 capsule 0  . XARELTO 20 MG TABS tablet TAKE 1 TABLET BY MOUTH DAILY WITH SUPPER 30 tablet 6   No current facility-administered medications on file prior to visit.     Review of Systems:  As per HPI- otherwise negative. She does have a BP cuff at home but it does not work  She feels overall  well She sometimes has a headache but nothing severe No CP or SOB   Physical Examination: There were no vitals filed for this visit. There were no vitals filed for this visit. There is no height or weight on file to calculate BMI. Ideal Body Weight:   Not able to check blood pressure at home Spoke with pt on the phone today- she sounds well, no cough or wheezing   Assessment and Plan: Essential hypertension - Plan: amLODipine (NORVASC) 10 MG tablet  Following up today for uncontrolled blood pressure.  She was recent noted to have quite elevated blood pressure at oncology clinic, was given a dose of clonidine.  No other changes made at that time.  Today we will double her amlodipine from 5 to 10 mg. We will bring her in next week for an office visit so I can check her blood pressure and make any other necessary adjustments Call time 5:33  Signed Lamar Blinks, MD

## 2019-04-23 ENCOUNTER — Other Ambulatory Visit: Payer: Self-pay | Admitting: Family Medicine

## 2019-04-23 DIAGNOSIS — Z7901 Long term (current) use of anticoagulants: Secondary | ICD-10-CM

## 2019-05-20 ENCOUNTER — Telehealth: Payer: Self-pay | Admitting: Family Medicine

## 2019-05-20 DIAGNOSIS — I10 Essential (primary) hypertension: Secondary | ICD-10-CM

## 2019-05-20 MED ORDER — LISINOPRIL 40 MG PO TABS: 40.0000 mg | ORAL_TABLET | Freq: Every day | ORAL | 3 refills | Status: DC

## 2019-05-20 NOTE — Telephone Encounter (Signed)
Medication refilled

## 2019-05-20 NOTE — Telephone Encounter (Signed)
Pt came to office requesting refill on lisinopril (PRINIVIL,ZESTRIL) 40 MG tablet and also amLODipine (NORVASC) 10 MG tablet. Pt would like to have sent to [facility]. Please advise.

## 2019-05-25 DIAGNOSIS — R7303 Prediabetes: Secondary | ICD-10-CM | POA: Insufficient documentation

## 2019-05-25 HISTORY — DX: Prediabetes: R73.03

## 2019-05-25 NOTE — Progress Notes (Addendum)
[place] at [facility], [place], [place] [address] [phone]  Date:  83/33/8329   Name:  [name]   DOB:  March 02, 1956   MRN:  [id number]  PCP:  Darreld Mclean, MD    Chief Complaint: Medication Refills (declines flu shot, would like labs)   History of Present Illness:  [name] is a 63 y.o. 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 Earlie Server in May of this year Her most recent MRI chest in March 2020 showed no recurrence, he suggested a CT scan in 6 months  Mammogram-per pt it was done last year through her job.  I do not have this report.  Per pt is not due yet  Bone density scan is indicated- she would like to have this done  Flu shot- pt declines  Can start pneumonia series- she declines for now  Pap and colon cancer screening are up-to-date  She had a CMP and CBC in May  Amlodipine 10 Lisinopril 40 Lovastatin Xarelto  BP Readings from Last 3 Encounters:  05/26/19 132/80  01/05/19 (!) 193/96  07/02/18 (!) 175/91    Patient Active Problem List   Diagnosis Date Noted  . Prediabetes 05/25/2019  . 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 Bilateral leg pain 07/02/2016  . History of DVT of lower extremity 05/31/2016  . History of colon cancer, stage I 08/17/2015  . Varicose 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 08/26/2014  . HTN (hypertension) 12/24/2013  . Cancer of left colon ([place]) 02/25/2012  . DVT (deep venous thrombosis) ([place]) 08/27/2011    Past Medical History:   Diagnosis Date  . Cancer ([place]) 08/03/2010   colon  . 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 HLD (hyperlipidemia)   . Hypertension   . Uterine fibroid     Past Surgical History:  Procedure Laterality Date  . ABDOMINAL HYSTERECTOMY    . CHOLECYSTECTOMY    . COLON SURGERY    . GALLBLADDER SURGERY    . MEDIASTERNOTOMY N/A 11/24/2017   Procedure: PARTIAL STERNOTOMY;  Surgeon: [name], MD;  Location: [facility];  Service: Thoracic;  Laterality: N/A;  . THYMECTOMY  11/24/2017   Procedure: THYMECTOMY;  Surgeon: [name], MD;  Location: [facility] OR;  Service: Thoracic;;    Social History   Tobacco Use  . Smoking status: Never Smoker  . Smokeless tobacco: Never Used  Substance Use Topics  . Alcohol use: No    Alcohol/week: 0.0 standard drinks  . Drug use: No    Family History  Problem Relation Age of Onset  . Heart attack Mother   . Hypertension Father     No Known Allergies  Medication list has been reviewed and updated.  Current Outpatient Medications on File Prior to Visit  Medication Sig Dispense Refill  . amLODipine (NORVASC) 10 MG tablet Take 1 tablet (10 mg total) by mouth daily. 30 tablet 3  . lisinopril (ZESTRIL) 40 MG tablet  Take 1 tablet (40 mg total) by mouth daily. 90 tablet 3  . XARELTO 20 MG TABS tablet TAKE 1 TABLET BY MOUTH DAILY WITH SUPPER 30 tablet 6  . lovastatin (MEVACOR) 20 MG tablet Take 1 my mouth daily (Patient not taking: Reported on 05/26/2019) 90 tablet 3   No current facility-administered medications on file prior to visit.     Review of Systems:  As per HPI- otherwise negative.   Physical Examination: Vitals:   05/26/19 1423  BP: 132/80  Pulse: 76  Resp: 15  Temp: 97.7 F (36.5 C)  SpO2: 98%   Vitals:   05/26/19 1423  Weight: 190 lb (86.2 kg)  Height: 5' 7"  (1.702 m)   Body mass index is 29.76 kg/m. Ideal Body Weight: Weight in (lb) to have BMI = 25:  159.3  GEN: WDWN, NAD, Non-toxic, A & O x 3, overweight, tall build.  Looks well HEENT: Atraumatic, Normocephalic. Neck supple. No masses, No LAD.  TM within normal limits bilaterally Ears and Nose: No external deformity. CV: RRR, No M/G/R. No JVD. No thrill. No extra heart sounds. PULM: CTA B, no wheezes, crackles, rhonchi. No retractions. No resp. distress. No accessory muscle use. ABD: S, NT, ND. No rebound. No HSM. EXTR: No c/c/e NEURO Normal gait.  PSYCH: Normally interactive. Conversant. Not depressed or anxious appearing.  Calm demeanor.    Assessment and Plan: Essential hypertension - Plan: CBC, Comprehensive metabolic panel, amLODipine (NORVASC) 10 MG tablet  S/P thymectomy - Plan: TSH  Prediabetes - Plan: Comprehensive metabolic panel, Hemoglobin A1c  History of colon cancer, stage I  Anticoagulant long-term use - Plan: rivaroxaban (XARELTO) 20 MG TABS tablet  Dyslipidemia - Plan: Lipid panel, lovastatin (MEVACOR) 20 MG tablet  Vitamin D deficiency - Plan: Vitamin D (25 hydroxy)  Estrogen deficiency - Plan: DG Bone Density  Here today for a follow-up visit Blood pressure under better control on current regimen Labs pending as above Refilled her medications for blood pressure, cholesterol, Xarelto Bone density scan pending She declines flu shot and pneumonia vaccine for now Will plan further follow- up pending labs.   Signed Lamar Blinks, MD  Called her on 10/20 to go over labs She has started on high dose weekly vit D Will DC lovastatin and start crestor Letter to pt as well   Results for orders placed or performed in visit on 05/26/19  CBC  Result Value Ref Range   WBC 6.0 4.0 - 10.5 K/uL   RBC 4.58 3.87 - 5.11 Mil/uL   Platelets 219.0 150.0 - 400.0 K/uL   Hemoglobin 14.2 12.0 - 15.0 g/dL   HCT 42.8 36.0 - 46.0 %   MCV 93.3 78.0 - 100.0 fl   MCHC 33.2 30.0 - 36.0 g/dL   RDW 13.0 11.5 - 15.5 %  Comprehensive metabolic panel  Result Value Ref  Range   Sodium 141 135 - 145 mEq/L   Potassium 4.3 3.5 - 5.1 mEq/L   Chloride 104 96 - 112 mEq/L   CO2 29 19 - 32 mEq/L   Glucose, Bld 152 (H) 70 - 99 mg/dL   BUN 16 6 - 23 mg/dL   Creatinine, Ser 0.88 0.40 - 1.20 mg/dL   Total Bilirubin 0.9 0.2 - 1.2 mg/dL   Alkaline Phosphatase 86 39 - 117 U/L   AST 14 0 - 37 U/L   ALT 17 0 - 35 U/L   Total Protein 7.3 6.0 - 8.3 g/dL   Albumin 5.1 3.5 - 5.2  g/dL   Calcium 10.0 8.4 - 10.5 mg/dL   GFR 64.38 >60.00 mL/min  Hemoglobin A1c  Result Value Ref Range   Hgb A1c MFr Bld 6.3 4.6 - 6.5 %  Lipid panel  Result Value Ref Range   Cholesterol 326 (H) 0 - 200 mg/dL   Triglycerides 158.0 (H) 0.0 - 149.0 mg/dL   HDL 61.90 >39.00 mg/dL   VLDL 31.6 0.0 - 40.0 mg/dL   LDL Cholesterol 232 (H) 0 - 99 mg/dL   Total CHOL/HDL Ratio 5    NonHDL 263.80   TSH  Result Value Ref Range   TSH 0.99 0.35 - 4.50 uIU/mL  Vitamin D (25 hydroxy)  Result Value Ref Range   VITD 19.28 (L) 30.00 - 100.00 ng/mL

## 2019-05-26 ENCOUNTER — Ambulatory Visit (INDEPENDENT_AMBULATORY_CARE_PROVIDER_SITE_OTHER): Payer: 59 | Admitting: Family Medicine

## 2019-05-26 ENCOUNTER — Encounter: Payer: Self-pay | Admitting: Family Medicine

## 2019-05-26 ENCOUNTER — Other Ambulatory Visit: Payer: Self-pay

## 2019-05-26 ENCOUNTER — Other Ambulatory Visit: Payer: Self-pay | Admitting: Family Medicine

## 2019-05-26 VITALS — BP 132/80 | HR 76 | Temp 97.7°F | Resp 15 | Ht 67.0 in | Wt 190.0 lb

## 2019-05-26 DIAGNOSIS — Z9089 Acquired absence of other organs: Secondary | ICD-10-CM | POA: Diagnosis not present

## 2019-05-26 DIAGNOSIS — I10 Essential (primary) hypertension: Secondary | ICD-10-CM

## 2019-05-26 DIAGNOSIS — Z85038 Personal history of other malignant neoplasm of large intestine: Secondary | ICD-10-CM

## 2019-05-26 DIAGNOSIS — E559 Vitamin D deficiency, unspecified: Secondary | ICD-10-CM

## 2019-05-26 DIAGNOSIS — R7303 Prediabetes: Secondary | ICD-10-CM | POA: Diagnosis not present

## 2019-05-26 DIAGNOSIS — E785 Hyperlipidemia, unspecified: Secondary | ICD-10-CM

## 2019-05-26 DIAGNOSIS — E2839 Other primary ovarian failure: Secondary | ICD-10-CM

## 2019-05-26 DIAGNOSIS — Z7901 Long term (current) use of anticoagulants: Secondary | ICD-10-CM

## 2019-05-26 MED ORDER — RIVAROXABAN 20 MG PO TABS: ORAL_TABLET | ORAL | 3 refills | Status: DC

## 2019-05-26 MED ORDER — AMLODIPINE BESYLATE 10 MG PO TABS: 10.0000 mg | ORAL_TABLET | Freq: Every day | ORAL | 3 refills | Status: DC

## 2019-05-26 MED ORDER — LOVASTATIN 20 MG PO TABS: ORAL_TABLET | ORAL | 3 refills | Status: DC

## 2019-05-26 NOTE — Patient Instructions (Signed)
Good to see you today!  I will be in touch with your labs, and will set you up for a bone density scan I do recommend a flu shot and pneumonia shot for you  BP looks good today

## 2019-05-27 LAB — CBC
HCT: 42.8 % (ref 36.0–46.0)
Hemoglobin: 14.2 g/dL (ref 12.0–15.0)
MCHC: 33.2 g/dL (ref 30.0–36.0)
MCV: 93.3 fl (ref 78.0–100.0)
Platelets: 219 10*3/uL (ref 150.0–400.0)
RBC: 4.58 Mil/uL (ref 3.87–5.11)
RDW: 13 % (ref 11.5–15.5)
WBC: 6 10*3/uL (ref 4.0–10.5)

## 2019-05-27 LAB — LIPID PANEL
Cholesterol: 326 mg/dL — ABNORMAL HIGH (ref 0–200)
HDL: 61.9 mg/dL (ref >39.00)
LDL Cholesterol: 232 mg/dL — ABNORMAL HIGH (ref 0–99)
NonHDL: 263.8
Total CHOL/HDL Ratio: 5
Triglycerides: 158 mg/dL — ABNORMAL HIGH (ref 0.0–149.0)
VLDL: 31.6 mg/dL (ref 0.0–40.0)

## 2019-05-27 LAB — COMPREHENSIVE METABOLIC PANEL
ALT: 17 U/L (ref 0–35)
AST: 14 U/L (ref 0–37)
Albumin: 5.1 g/dL (ref 3.5–5.2)
Alkaline Phosphatase: 86 U/L (ref 39–117)
BUN: 16 mg/dL (ref 6–23)
CO2: 29 mEq/L (ref 19–32)
Calcium: 10 mg/dL (ref 8.4–10.5)
Chloride: 104 mEq/L (ref 96–112)
Creatinine, Ser: 0.88 mg/dL (ref 0.40–1.20)
GFR: 64.38 mL/min (ref >60.00)
Glucose, Bld: 152 mg/dL — ABNORMAL HIGH (ref 70–99)
Potassium: 4.3 mEq/L (ref 3.5–5.1)
Sodium: 141 mEq/L (ref 135–145)
Total Bilirubin: 0.9 mg/dL (ref 0.2–1.2)
Total Protein: 7.3 g/dL (ref 6.0–8.3)

## 2019-05-27 LAB — HEMOGLOBIN A1C: Hgb A1c MFr Bld: 6.3 % (ref 4.6–6.5)

## 2019-05-27 LAB — VITAMIN D 25 HYDROXY (VIT D DEFICIENCY, FRACTURES): VITD: 19.28 ng/mL — ABNORMAL LOW (ref 30.00–100.00)

## 2019-05-27 LAB — TSH: TSH: 0.99 u[IU]/mL (ref 0.35–4.50)

## 2019-06-01 MED ORDER — ROSUVASTATIN CALCIUM 20 MG PO TABS: 20.0000 mg | ORAL_TABLET | Freq: Every day | ORAL | 3 refills | Status: DC

## 2019-06-01 NOTE — Addendum Note (Signed)
Addended by: Lamar Blinks C on: 06/01/2019 06:51 PM   Modules accepted: Orders

## 2019-06-04 ENCOUNTER — Other Ambulatory Visit: Payer: Self-pay

## 2019-06-04 ENCOUNTER — Ambulatory Visit (HOSPITAL_BASED_OUTPATIENT_CLINIC_OR_DEPARTMENT_OTHER)
Admission: RE | Admit: 2019-06-04 | Discharge: 2019-06-04 | Disposition: A | Discharge status: Home / Self Care | Payer: 59 | Source: Ambulatory Visit | Attending: Family Medicine | Admitting: Family Medicine

## 2019-06-04 DIAGNOSIS — E2839 Other primary ovarian failure: Secondary | ICD-10-CM | POA: Insufficient documentation

## 2019-07-09 ENCOUNTER — Inpatient Hospital Stay: Payer: 59 | Attending: Internal Medicine

## 2019-07-09 ENCOUNTER — Other Ambulatory Visit: Payer: Self-pay

## 2019-07-09 ENCOUNTER — Telehealth: Payer: Self-pay | Admitting: Medical Oncology

## 2019-07-09 DIAGNOSIS — Z79899 Other long term (current) drug therapy: Secondary | ICD-10-CM | POA: Diagnosis not present

## 2019-07-09 DIAGNOSIS — E785 Hyperlipidemia, unspecified: Secondary | ICD-10-CM | POA: Insufficient documentation

## 2019-07-09 DIAGNOSIS — Z85038 Personal history of other malignant neoplasm of large intestine: Secondary | ICD-10-CM | POA: Diagnosis not present

## 2019-07-09 DIAGNOSIS — Z9089 Acquired absence of other organs: Secondary | ICD-10-CM

## 2019-07-09 DIAGNOSIS — Z86718 Personal history of other venous thrombosis and embolism: Secondary | ICD-10-CM | POA: Insufficient documentation

## 2019-07-09 DIAGNOSIS — I10 Essential (primary) hypertension: Secondary | ICD-10-CM | POA: Diagnosis not present

## 2019-07-09 DIAGNOSIS — D150 Benign neoplasm of thymus: Secondary | ICD-10-CM | POA: Insufficient documentation

## 2019-07-09 DIAGNOSIS — Z7901 Long term (current) use of anticoagulants: Secondary | ICD-10-CM | POA: Insufficient documentation

## 2019-07-09 LAB — CBC WITH DIFFERENTIAL (CANCER CENTER ONLY)
Abs Immature Granulocytes: 0.01 10*3/uL (ref 0.00–0.07)
Basophils Absolute: 0.1 10*3/uL (ref 0.0–0.1)
Basophils Relative: 1 %
Eosinophils Absolute: 0 10*3/uL (ref 0.0–0.5)
Eosinophils Relative: 1 %
HCT: 39.9 % (ref 36.0–46.0)
Hemoglobin: 12.9 g/dL (ref 12.0–15.0)
Immature Granulocytes: 0 %
Lymphocytes Relative: 40 %
Lymphs Abs: 1.8 10*3/uL (ref 0.7–4.0)
MCH: 31.1 pg (ref 26.0–34.0)
MCHC: 32.3 g/dL (ref 30.0–36.0)
MCV: 96.1 fL (ref 80.0–100.0)
Monocytes Absolute: 0.4 10*3/uL (ref 0.1–1.0)
Monocytes Relative: 9 %
Neutro Abs: 2.2 10*3/uL (ref 1.7–7.7)
Neutrophils Relative %: 49 %
Platelet Count: 155 10*3/uL (ref 150–400)
RBC: 4.15 MIL/uL (ref 3.87–5.11)
RDW: 12.7 % (ref 11.5–15.5)
WBC Count: 4.5 10*3/uL (ref 4.0–10.5)
nRBC: 0 % (ref 0.0–0.2)

## 2019-07-09 LAB — CMP (CANCER CENTER ONLY)
ALT: 20 U/L (ref 0–44)
AST: 17 U/L (ref 15–41)
Albumin: 4.2 g/dL (ref 3.5–5.0)
Alkaline Phosphatase: 72 U/L (ref 38–126)
Anion gap: 9 (ref 5–15)
BUN: 14 mg/dL (ref 8–23)
CO2: 28 mmol/L (ref 22–32)
Calcium: 9.1 mg/dL (ref 8.9–10.3)
Chloride: 107 mmol/L (ref 98–111)
Creatinine: 1.02 mg/dL — ABNORMAL HIGH (ref 0.44–1.00)
GFR, Est AFR Am: >60 mL/min (ref >60)
GFR, Estimated: 58 mL/min — ABNORMAL LOW (ref >60)
Glucose, Bld: 112 mg/dL — ABNORMAL HIGH (ref 70–99)
Potassium: 3.8 mmol/L (ref 3.5–5.1)
Sodium: 144 mmol/L (ref 135–145)
Total Bilirubin: 0.7 mg/dL (ref 0.3–1.2)
Total Protein: 6.6 g/dL (ref 6.5–8.1)

## 2019-07-09 NOTE — Telephone Encounter (Signed)
LVM to return my call in response to her called to Access nurse.

## 2019-07-12 ENCOUNTER — Other Ambulatory Visit: Payer: Self-pay

## 2019-07-12 ENCOUNTER — Encounter: Payer: Self-pay | Admitting: Internal Medicine

## 2019-07-12 ENCOUNTER — Inpatient Hospital Stay (HOSPITAL_BASED_OUTPATIENT_CLINIC_OR_DEPARTMENT_OTHER): Payer: 59 | Admitting: Internal Medicine

## 2019-07-12 VITALS — BP 158/74 | HR 65 | Temp 98.3°F | Resp 20 | Ht 67.0 in | Wt 194.6 lb

## 2019-07-12 DIAGNOSIS — Z9089 Acquired absence of other organs: Secondary | ICD-10-CM | POA: Diagnosis not present

## 2019-07-12 DIAGNOSIS — D150 Benign neoplasm of thymus: Secondary | ICD-10-CM | POA: Diagnosis not present

## 2019-07-12 NOTE — Progress Notes (Signed)
[facility] [phone]   [phone]  OFFICE PROGRESS NOTE  Copland, Gay Filler, MD [address] [place] [address]  DIAGNOSIS: Stage I thymoma diagnosed in February 2019.  PRIOR THERAPY: Status post thymectomy under the care of Dr. Roxan Hockey on 11/24/2017  CURRENT THERAPY: Observation.  INTERVAL HISTORY: [name] 63 y.o. female returns to the clinic today for follow-up visit.  The patient is feeling fine today with no concerning complaints.  She denied having any chest pain, shortness of breath, cough or hemoptysis.  She denied having any fever or chills.  She has no nausea, vomiting, diarrhea or constipation.  She denied having any headache or visual changes.  She was supposed to have repeat CT scan of the chest before this visit but the patient has a lot of imaging studies performed recently and she canceled her scan.  She is here today for evaluation and recent repeat blood work.  MEDICAL HISTORY: Past Medical History:  Diagnosis Date  . Cancer ([place]) 08/03/2010   colon  . 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 HLD (hyperlipidemia)   . Hypertension   . Uterine fibroid     ALLERGIES:  has No Known Allergies.  MEDICATIONS:  Current Outpatient Medications  Medication Sig Dispense Refill  . amLODipine (NORVASC) 10 MG tablet Take 1 tablet (10 mg total) by mouth daily. 90 tablet 3  . lisinopril (ZESTRIL) 40 MG tablet TAKE 1 TABLET BY MOUTH EVERY DAY 90 tablet 3  . rivaroxaban (XARELTO) 20 MG TABS tablet TAKE 1 TABLET BY MOUTH DAILY WITH SUPPER 90 tablet 3  . rosuvastatin (CRESTOR) 20 MG tablet Take 1 tablet (20 mg total) by mouth daily. 90 tablet 3  . Vitamin D, Ergocalciferol, (DRISDOL) 1.25 MG (50000 UT) CAPS capsule TAKE 1 CAPSULE BY MOUTH EVERY 7 (SEVEN) DAYS. 4 capsule 2   No current facility-administered medications for this visit.     SURGICAL HISTORY:   Past Surgical History:  Procedure Laterality Date  . ABDOMINAL HYSTERECTOMY    . CHOLECYSTECTOMY    . COLON SURGERY    . GALLBLADDER SURGERY    . MEDIASTERNOTOMY N/A 11/24/2017   Procedure: PARTIAL STERNOTOMY;  Surgeon: [name], MD;  Location: [facility];  Service: Thoracic;  Laterality: N/A;  . THYMECTOMY  11/24/2017   Procedure: THYMECTOMY;  Surgeon: [name], MD;  Location: [facility];  Service: Thoracic;;    REVIEW OF SYSTEMS:  A comprehensive review of systems was negative.   PHYSICAL EXAMINATION: General appearance: alert, cooperative and no distress Head: Normocephalic, without obvious abnormality, atraumatic Neck: no adenopathy, no JVD, supple, symmetrical, trachea midline and thyroid not enlarged, symmetric, no tenderness/mass/nodules Lymph nodes: Cervical, supraclavicular, and axillary nodes normal. Resp: clear to auscultation bilaterally Back: symmetric, no curvature. ROM normal. No CVA tenderness. Cardio: regular rate and rhythm, S1, S2 normal, no murmur, click, rub or gallop GI: soft, non-tender; bowel sounds normal; no masses,  no organomegaly Extremities: extremities normal, atraumatic, no cyanosis or edema  ECOG PERFORMANCE STATUS: 1 - Symptomatic but completely ambulatory  Blood pressure (!) 158/74, pulse 65, temperature 98.3 F (36.8 C), temperature source Oral, resp. rate 20, height 5\' 7"  (1.702 m), weight 194 lb 9.6 oz (88.3 kg), SpO2 100 %.  LABORATORY DATA: Lab Results  Component Value Date   WBC 4.5 07/09/2019   HGB 12.9 07/09/2019   HCT 39.9 07/09/2019  MCV 96.1 07/09/2019   PLT 155 07/09/2019      Chemistry      Component Value Date/Time   NA 144 07/09/2019 0905   NA 145 (H) 11/11/2017 1305   NA 144 09/01/2015 1421   K 3.8 07/09/2019 0905   K 3.6 09/01/2015 1421   CL 107 07/09/2019 0905   CL 106 08/26/2012 0905   CO2 28 07/09/2019 0905   CO2 28 09/01/2015 1421   BUN 14 07/09/2019 0905   BUN 11 11/11/2017 1305   BUN 22.0  09/01/2015 1421   CREATININE 1.02 (H) 07/09/2019 0905   CREATININE 1.1 09/01/2015 1421      Component Value Date/Time   CALCIUM 9.1 07/09/2019 0905   CALCIUM 9.3 09/01/2015 1421   ALKPHOS 72 07/09/2019 0905   ALKPHOS 89 09/01/2015 1421   AST 17 07/09/2019 0905   AST 19 09/01/2015 1421   ALT 20 07/09/2019 0905   ALT 22 09/01/2015 1421   BILITOT 0.7 07/09/2019 0905   BILITOT 0.55 09/01/2015 1421       RADIOGRAPHIC STUDIES: No results found.  ASSESSMENT AND PLAN: This is a very pleasant 63 years old white female with history of a stage I thymoma status post surgical resection.  The patient is currently on observation and she is feeling fine. The patient has no concerning complaints today. I recommended for her to have repeat CT scan of the chest in 6 months for further evaluation of her condition and to rule out any other abnormalities. For hypertension she was advised to monitor her blood pressure closely at home and to discuss with her primary care physician any changes if needed. The patient was advised to call immediately if she has any other concerning symptoms in the interval. The patient voices understanding of current disease status and treatment options and is in agreement with the current care plan.  All questions were answered. The patient knows to call the clinic with any problems, questions or concerns. We can certainly see the patient much sooner if necessary.  I spent 10 minutes counseling the patient face to face. The total time spent in the appointment was 15 minutes.  Disclaimer: This note was dictated with voice recognition software. Similar sounding words can inadvertently be transcribed and may not be corrected upon review.

## 2019-07-13 ENCOUNTER — Telehealth: Payer: Self-pay | Admitting: Internal Medicine

## 2019-07-13 NOTE — Telephone Encounter (Signed)
Scheduled per los. Called and left msg. Mailed printout  °

## 2019-07-27 NOTE — Progress Notes (Signed)
[facility] [place], [place], [place] [address] [phone]  Date:  16/60/6301   Name:  [name]   DOB:  [birth year]   MRN:  [id number]  PCP:  Darreld Mclean, MD    Chief Complaint: Breast Pain (left side, possible chest, one month)   History of Present Illness:  [name] is a 63 y.o. very pleasant female patient who presents with the following:  Patient with history of colon cancer, DVT, prediabetes, hypertension, stage 1 thymoma s/p sternotomy and thymemectomy in 2019  Here today with concern of pain in her left breast/left chest- she has noted this for about one month Happens most days, more frequently occurs in the afternoon.  The pain may last for a few minutes- she is really not sure how long, could be 10 minutes or could be 45 She does not feel SOB The pain is not exertional  She sometimes feels like food is getting stuck in her throat or like she has heartburn  Last mammogram about 3 years ago-she knows that she is due  No personal or family history of heart disease She also had a coronary CT last year - coronary calcium score of 0  11/2017: IMPRESSION: 1. Coronary calcium score of 0. This was 0 percentile for age and sex matched control. 2. Normal coronary origin with right dominance. 3. The study is significantly affected by motion, however there is no significant plaque in any of the coronary arteries  Pneumonia vaccine-declines Flu shot- declines today  Lab Results  Component Value Date   HGBA1C 6.3 05/26/2019     Patient Active Problem List   Diagnosis Date Noted  . Prediabetes 05/25/2019  . 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 Bilateral leg pain 07/02/2016  . History of DVT of lower extremity 05/31/2016  . History of colon cancer, stage I 08/17/2015  . Varicose 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 08/26/2014  . HTN (hypertension) 12/24/2013  . Cancer of left colon  ([facility]) 02/25/2012  . DVT (deep venous thrombosis) ([facility]) 08/27/2011    Past Medical History:  Diagnosis Date  . Cancer ([facility]) 08/03/2010   colon  . 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 HLD (hyperlipidemia)   . Hypertension   . Uterine fibroid     Past Surgical History:  Procedure Laterality Date  . ABDOMINAL HYSTERECTOMY    . CHOLECYSTECTOMY    . COLON SURGERY    . GALLBLADDER SURGERY    . MEDIASTERNOTOMY N/A 11/24/2017   Procedure: PARTIAL STERNOTOMY;  Surgeon: [name], MD;  Location: [facility];  Service: Thoracic;  Laterality: N/A;  . THYMECTOMY  11/24/2017   Procedure: THYMECTOMY;  Surgeon: [name], MD;  Location: [facility];  Service: Thoracic;;    Social History   Tobacco Use  . Smoking status: Never Smoker  . Smokeless tobacco: Never Used  Substance Use Topics  . Alcohol use: No    Alcohol/week: 0.0 standard drinks  . Drug use: No    Family History  Problem Relation Age of Onset  . Heart attack Mother   . Hypertension Father     No Known Allergies  Medication list has been reviewed and updated.  Current Outpatient Medications on File Prior to Visit  Medication Sig Dispense Refill  . amLODipine (NORVASC) 10 MG tablet Take 1 tablet (10 mg total) by mouth daily. 90 tablet  3  . lisinopril (ZESTRIL) 40 MG tablet TAKE 1 TABLET BY MOUTH EVERY DAY 90 tablet 3  . rivaroxaban (XARELTO) 20 MG TABS tablet TAKE 1 TABLET BY MOUTH DAILY WITH SUPPER 90 tablet 3  . rosuvastatin (CRESTOR) 20 MG tablet Take 1 tablet (20 mg total) by mouth daily. 90 tablet 3  . Vitamin D, Ergocalciferol, (DRISDOL) 1.25 MG (50000 UT) CAPS capsule TAKE 1 CAPSULE BY MOUTH EVERY 7 (SEVEN) DAYS. 4 capsule 2   No current facility-administered medications on file prior to visit.    Review of Systems:  As per HPI- otherwise negative. No fever or chills  Physical Examination: Vitals:   07/28/19 1452  BP: 136/80  Pulse: 60   Resp: 17  Temp: (!) 96.9 F (36.1 C)  SpO2: 98%   Vitals:   07/28/19 1452  Weight: 195 lb (88.5 kg)  Height: 5\' 7"  (1.702 m)   Body mass index is 30.54 kg/m. Ideal Body Weight: Weight in (lb) to have BMI = 25: 159.3  GEN: WDWN, NAD, Non-toxic, A & O x 3, overweight, tall build.  Looks well  HEENT: Atraumatic, Normocephalic. Neck supple. No masses, No LAD. Ears and Nose: No external deformity. CV: RRR, No M/G/R. No JVD. No thrill. No extra heart sounds. PULM: CTA B, no wheezes, crackles, rhonchi. No retractions. No resp. distress. No accessory muscle use. ABD: S, NT, ND, +BS. No rebound. No HSM. EXTR: No c/c/e NEURO Normal gait.  PSYCH: Normally interactive. Conversant. Not depressed or anxious appearing.  Calm demeanor.  Well-healed sternotomy scar is present Bilateral breast exam is normal I am able to reproduce her chest tenderness by pressing on the left sternal border  EKG: mild brady with rate 57, o/w normal Compared with tracing from April, 2019 no significant change is noted     Assessment and Plan: Chest pain, unspecified type - Plan: EKG 12-Lead, MM DIAG BREAST TOMO BILATERAL, Troponin I (High Sensitivity), pantoprazole (PROTONIX) 40 MG tablet, Troponin I  Encounter for screening mammogram for malignant neoplasm of breast - Plan: DG Chest 2 View  Patient with history of colon cancer, thymoma-here today with concern of left-sided chest pain for about 1 month.  Nonexertional, atypical in nature She also had a coronary calcium score of 0 last year which is reassuring  I suspect this is noncardiac pain.  We will check a troponin today, and chest x-ray Assuming these are normal, plan to treat for presumed GERD with pantoprazole  She is also well overdue for her mammogram-have ordered for her  I will also touch base with her cardiologist to make sure no further work-up is warranted  This visit occurred during the SARS-CoV-2 public health emergency.  Safety  protocols were in place, including screening questions prior to the visit, additional usage of staff PPE, and extensive cleaning of exam room while observing appropriate contact time as indicated for disinfecting solutions.    Signed Lamar Blinks, MD  Received her chest film and troponin-okay For stat troponin was sent to [facility], for whatever reason the reference range does not come across correctly. I believe it is less than or equal to 0.05 Message to patient  Results for orders placed or performed in visit on 07/28/19  Troponin I  Result Value Ref Range   Troponin I <0.01 < OR = 0.0 ng/mL   DG Chest 2 View  Result Date: 07/28/2019 CLINICAL DATA:  Left-sided chest pain EXAM: CHEST - 2 VIEW COMPARISON:  12/09/2017 FINDINGS: Median sternotomy changes. The heart  size and mediastinal contours are within normal limits. Calcific aortic knob. Both lungs are clear. The visualized skeletal structures are unremarkable. IMPRESSION: No active cardiopulmonary disease. Electronically Signed   By: Davina Poke M.D.   On: 07/28/2019 16:25

## 2019-07-28 ENCOUNTER — Encounter: Payer: Self-pay | Admitting: Family Medicine

## 2019-07-28 ENCOUNTER — Ambulatory Visit (HOSPITAL_BASED_OUTPATIENT_CLINIC_OR_DEPARTMENT_OTHER)
Admission: RE | Admit: 2019-07-28 | Discharge: 2019-07-28 | Disposition: A | Discharge status: Home / Self Care | Payer: 59 | Source: Ambulatory Visit | Attending: Family Medicine | Admitting: Family Medicine

## 2019-07-28 ENCOUNTER — Ambulatory Visit (INDEPENDENT_AMBULATORY_CARE_PROVIDER_SITE_OTHER): Payer: 59 | Admitting: Family Medicine

## 2019-07-28 ENCOUNTER — Other Ambulatory Visit: Payer: Self-pay

## 2019-07-28 ENCOUNTER — Other Ambulatory Visit: Payer: Self-pay | Admitting: Family Medicine

## 2019-07-28 VITALS — BP 136/80 | HR 60 | Temp 96.9°F | Resp 17 | Ht 67.0 in | Wt 195.0 lb

## 2019-07-28 DIAGNOSIS — Z1231 Encounter for screening mammogram for malignant neoplasm of breast: Secondary | ICD-10-CM | POA: Diagnosis not present

## 2019-07-28 DIAGNOSIS — R079 Chest pain, unspecified: Secondary | ICD-10-CM

## 2019-07-28 LAB — TROPONIN I: Troponin I: <0.01 ng/mL (ref <=0.0)

## 2019-07-28 MED ORDER — PANTOPRAZOLE SODIUM 40 MG PO TBEC: 40.0000 mg | DELAYED_RELEASE_TABLET | Freq: Every day | ORAL | 3 refills | Status: DC

## 2019-07-28 NOTE — Patient Instructions (Signed)
It was good to see you today- I will be in touch with your labs and x-ray as soon as possible Start on the protonix once a day for possible heartburn/ stomach pain  If you are getting worse please do let me know right away!

## 2019-09-10 NOTE — Telephone Encounter (Signed)
Close this encounter

## 2019-09-16 ENCOUNTER — Other Ambulatory Visit: Payer: Self-pay | Admitting: Family Medicine

## 2019-09-16 DIAGNOSIS — E559 Vitamin D deficiency, unspecified: Secondary | ICD-10-CM

## 2019-09-21 ENCOUNTER — Telehealth: Payer: Self-pay

## 2019-09-21 NOTE — Telephone Encounter (Signed)
Patient called in to see if Dr. Lorelei Pont can give Dr. Esmond Plants ([facility]) at [phone] for [facility]   Thanks,

## 2019-09-22 NOTE — Telephone Encounter (Signed)
What are we giving Dr. Veverly Fells? Please clarify on message sent over.

## 2019-10-14 ENCOUNTER — Other Ambulatory Visit: Payer: Self-pay

## 2019-10-14 ENCOUNTER — Ambulatory Visit: Admission: RE | Admit: 2019-10-14 | Payer: 59 | Source: Ambulatory Visit

## 2019-10-14 ENCOUNTER — Ambulatory Visit
Admission: RE | Admit: 2019-10-14 | Discharge: 2019-10-14 | Disposition: A | Discharge status: Home / Self Care | Payer: 59 | Source: Ambulatory Visit | Attending: Family Medicine | Admitting: Family Medicine

## 2019-10-14 DIAGNOSIS — R079 Chest pain, unspecified: Secondary | ICD-10-CM

## 2019-11-08 ENCOUNTER — Telehealth: Payer: Self-pay | Admitting: Family Medicine

## 2019-11-08 NOTE — Telephone Encounter (Signed)
Patient wants Dr. Lorelei Pont to contact Dr . Lonn Georgia [phone]. Patient states not enough information was given . Please call patient to get more information . Or call Dr Gilberto Better . 3/29/21dt

## 2019-11-10 NOTE — Telephone Encounter (Signed)
Sent patient mychart message

## 2019-11-17 ENCOUNTER — Telehealth: Payer: Self-pay | Admitting: Family Medicine

## 2019-11-17 NOTE — Telephone Encounter (Signed)
Per  ashely @ dr Veverly Fells  Please fill out medical clearance for patient

## 2019-11-17 NOTE — Telephone Encounter (Signed)
I have placed form in red folder for signature. Please advise if patient needs an appointment for proper testing prior to procedure.

## 2019-11-18 NOTE — Telephone Encounter (Signed)
Patient has been scheduled for pre op appointment

## 2019-11-21 NOTE — Patient Instructions (Addendum)
It was good to see you again today If not done already, I would encourage you to have your COVID-19 and shingles vaccines as soon as possible I will be in touch with your labs, please see cardiology prior to your operation as planned and take care!

## 2019-11-21 NOTE — Progress Notes (Addendum)
[facility] [address] [place], [place] [address] [phone]  Date:  9/76/7341   Name:  [name]   DOB:  08/01/1956   MRN:  [id number]  PCP:  Darreld Mclean, MD    Chief Complaint: No chief complaint on file.   History of Present Illness:  [name] is a 64 y.o. very pleasant female patient who presents with the following:  Visit today for preoperative clearance Patient is planning a shoulder operation per Dr. Malva Cogan cuff repair Her surgery has not yet been scheduled She has history of colon cancer, DVT, hypertension, prediabetes, stage I thymoma status post thymectomy 2019 I last saw her in December at which time she had concern of chest pain for about 1 month Coronary CT in 2019 showed calcium score of 0  Covid vaccine- not done yet, encouraged her to do so  Shingles vaccine-also encouraged  EKG 12/20- WNL She notes that taking treatment for GERD seemed to resolve her chest pains She does strengthening exercises for her back at home, and will walk for exercise but not formally No CP with 4 mets of activity such as housework She is seeing cardiology prior to her operation -in about 10 days  Patient Active Problem List   Diagnosis Date Noted  . Prediabetes 05/25/2019  . 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 Bilateral leg pain 07/02/2016  . History of colon cancer, stage I 08/17/2015  . Varicose 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 08/26/2014  . HTN (hypertension) 12/24/2013  . Cancer of left colon ([facility]) 02/25/2012  . DVT (deep venous thrombosis) ([facility]) 08/27/2011    Past Medical History:  Diagnosis Date  . Cancer ([place]) 08/03/2010   colon  . 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 HLD (hyperlipidemia)   . Hypertension   . Uterine fibroid     Past Surgical History:  Procedure Laterality Date  . ABDOMINAL HYSTERECTOMY    .  CHOLECYSTECTOMY    . COLON SURGERY    . GALLBLADDER SURGERY    . MEDIASTERNOTOMY N/A 11/24/2017   Procedure: PARTIAL STERNOTOMY;  Surgeon: [name], MD;  Location: [facility];  Service: Thoracic;  Laterality: N/A;  . THYMECTOMY  11/24/2017   Procedure: THYMECTOMY;  Surgeon: [name], MD;  Location: [facility] OR;  Service: Thoracic;;    Social History   Tobacco Use  . Smoking status: Never Smoker  . Smokeless tobacco: Never Used  Substance Use Topics  . Alcohol use: No    Alcohol/week: 0.0 standard drinks  . Drug use: No    Family History  Problem Relation Age of Onset  . Heart attack Mother   . Hypertension Father     No Known Allergies  Medication list has been reviewed and updated.  Current Outpatient Medications on File Prior to Visit  Medication Sig Dispense Refill  . amLODipine (NORVASC) 10 MG tablet Take 1 tablet (10 mg total) by mouth daily. 90 tablet 3  . lisinopril (ZESTRIL) 40 MG tablet TAKE 1 TABLET BY MOUTH EVERY DAY 90 tablet 3  . pantoprazole (PROTONIX) 40 MG tablet Take 1 tablet (40 mg total) by mouth daily. 30 tablet 3  . rivaroxaban (XARELTO) 20 MG TABS tablet TAKE 1 TABLET BY MOUTH DAILY WITH SUPPER 90 tablet 3  . rosuvastatin (CRESTOR) 20 MG tablet Take 1 tablet (20 mg total) by mouth daily. 90 tablet 3  .  Vitamin D, Ergocalciferol, (DRISDOL) 1.25 MG (50000 UNIT) CAPS capsule TAKE 1 CAPSULE BY MOUTH EVERY 7 (SEVEN) DAYS. 4 capsule 2   No current facility-administered medications on file prior to visit.    Review of Systems:  As per HPI- otherwise negative.   Physical Examination: Vitals:   11/22/19 1257 11/22/19 1312  BP: (!) 169/87 140/80  Pulse: 64   Resp: 16   Temp: 98.7 F (37.1 C)   SpO2: 100%    Vitals:   11/22/19 1257  Weight: 197 lb (89.4 kg)  Height: 5' 7.3" (1.709 m)   Body mass index is 30.58 kg/m. Ideal Body Weight: Weight in (lb) to have BMI = 25: 160.7  GEN: no acute distress.  Overweight, tall build.   Looks well HEENT: Atraumatic, Normocephalic.  Ears and Nose: No external deformity. CV: RRR, No M/G/R. No JVD. No thrill. No extra heart sounds. PULM: CTA B, no wheezes, crackles, rhonchi. No retractions. No resp. distress. No accessory muscle use. ABD: S, NT, ND, +BS. No rebound. No HSM. EXTR: No c/c/e PSYCH: Normally interactive. Conversant.    Assessment and Plan: Pre-operative clearance  Vitamin D deficiency - Plan: Vitamin D (25 hydroxy)  Prediabetes - Plan: Hemoglobin A1c  Essential hypertension - Plan: CBC, Comprehensive metabolic panel, TSH  Anticoagulant long-term use - Plan: CBC  Dyslipidemia - Plan: Lipid panel  Here today for preoperative clearance visit.  Assuming labs are normal patient is cleared from my standpoint, she is also seeing cardiology prior to her operation She is on chronic Xarelto for history of DVT, I will touch base with her hematologist about holding this medication for surgery Otherwise await her labs as above, will be in touch with her results Moderate medical decision making today  This visit occurred during the SARS-CoV-2 public health emergency.  Safety protocols were in place, including screening questions prior to the visit, additional usage of staff PPE, and extensive cleaning of exam room while observing appropriate contact time as indicated for disinfecting solutions.    Signed Lamar Blinks, MD   Received her labs as below, message to patient   Your blood counts look fine Metabolic profile is normal Your A1c-average blood sugar-is in the prediabetes range.  This is stable from previous, we will continue to observe Your cholesterol is somewhat better but still not at goal.  Are you taking your Crestor regularly?  If you have been taking it, I would like to increase the dose to 40 mg.  Please let me know Thyroid in normal range Vitamin D is slightly low.  Please take 2000 international units over-the-counter daily  I also asked  Dr. Julien Nordmann about your Xarelto that you take due to previous blood clot.  Your surgeon will likely want you to not take this for 1 to 2 days prior to your procedure.  This should be okay for you to  Take care, please see me in about 6 months  Results for orders placed or performed in visit on 11/22/19  CBC  Result Value Ref Range   WBC 3.9 (L) 4.0 - 10.5 K/uL   RBC 4.36 3.87 - 5.11 Mil/uL   Platelets 172.0 150.0 - 400.0 K/uL   Hemoglobin 13.7 12.0 - 15.0 g/dL   HCT 40.0 36.0 - 46.0 %   MCV 91.7 78.0 - 100.0 fl   MCHC 34.2 30.0 - 36.0 g/dL   RDW 13.0 11.5 - 15.5 %  Comprehensive metabolic panel  Result Value Ref Range   Sodium 142 135 -  145 mEq/L   Potassium 4.1 3.5 - 5.1 mEq/L   Chloride 103 96 - 112 mEq/L   CO2 29 19 - 32 mEq/L   Glucose, Bld 110 (H) 70 - 99 mg/dL   BUN 8 6 - 23 mg/dL   Creatinine, Ser 0.85 0.40 - 1.20 mg/dL   Total Bilirubin 0.8 0.2 - 1.2 mg/dL   Alkaline Phosphatase 99 39 - 117 U/L   AST 23 0 - 37 U/L   ALT 22 0 - 35 U/L   Total Protein 6.5 6.0 - 8.3 g/dL   Albumin 4.7 3.5 - 5.2 g/dL   GFR 66.91 >60.00 mL/min   Calcium 9.5 8.4 - 10.5 mg/dL  Hemoglobin A1c  Result Value Ref Range   Hgb A1c MFr Bld 6.3 4.6 - 6.5 %  Lipid panel  Result Value Ref Range   Cholesterol 278 (H) 0 - 200 mg/dL   Triglycerides 158.0 (H) 0.0 - 149.0 mg/dL   HDL 53.10 >39.00 mg/dL   VLDL 31.6 0.0 - 40.0 mg/dL   LDL Cholesterol 193 (H) 0 - 99 mg/dL   Total CHOL/HDL Ratio 5    NonHDL 224.82   TSH  Result Value Ref Range   TSH 2.09 0.35 - 4.50 uIU/mL  Vitamin D (25 hydroxy)  Result Value Ref Range   VITD 25.13 (L) 30.00 - 100.00 ng/mL   I also touched base with her hematologist regarding her blood thinner and upcoming surgery-   Hi Layia [place],  I usually ask them to hold the Xarelto 1 or 2 days before the procedure and resume it after the surgery. If she has been on Xarelto chronically for a long time I am not sure if the bridge to Lovenox is needed but it will never hurt if you  decide to do it. Thank you.  Julien Nordmann

## 2019-11-22 ENCOUNTER — Other Ambulatory Visit: Payer: Self-pay

## 2019-11-22 ENCOUNTER — Encounter: Payer: Self-pay | Admitting: Family Medicine

## 2019-11-22 ENCOUNTER — Ambulatory Visit (INDEPENDENT_AMBULATORY_CARE_PROVIDER_SITE_OTHER): Payer: 59 | Admitting: Family Medicine

## 2019-11-22 VITALS — BP 140/80 | HR 64 | Temp 98.7°F | Resp 16 | Ht 67.3 in | Wt 197.0 lb

## 2019-11-22 DIAGNOSIS — Z7901 Long term (current) use of anticoagulants: Secondary | ICD-10-CM

## 2019-11-22 DIAGNOSIS — R7303 Prediabetes: Secondary | ICD-10-CM

## 2019-11-22 DIAGNOSIS — I10 Essential (primary) hypertension: Secondary | ICD-10-CM

## 2019-11-22 DIAGNOSIS — Z01818 Encounter for other preprocedural examination: Secondary | ICD-10-CM | POA: Diagnosis not present

## 2019-11-22 DIAGNOSIS — E559 Vitamin D deficiency, unspecified: Secondary | ICD-10-CM | POA: Diagnosis not present

## 2019-11-22 DIAGNOSIS — E785 Hyperlipidemia, unspecified: Secondary | ICD-10-CM

## 2019-11-22 LAB — CBC
HCT: 40 % (ref 36.0–46.0)
Hemoglobin: 13.7 g/dL (ref 12.0–15.0)
MCHC: 34.2 g/dL (ref 30.0–36.0)
MCV: 91.7 fl (ref 78.0–100.0)
Platelets: 172 10*3/uL (ref 150.0–400.0)
RBC: 4.36 Mil/uL (ref 3.87–5.11)
RDW: 13 % (ref 11.5–15.5)
WBC: 3.9 10*3/uL — ABNORMAL LOW (ref 4.0–10.5)

## 2019-11-22 LAB — HEMOGLOBIN A1C: Hgb A1c MFr Bld: 6.3 % (ref 4.6–6.5)

## 2019-11-22 LAB — LIPID PANEL
Cholesterol: 278 mg/dL — ABNORMAL HIGH (ref 0–200)
HDL: 53.1 mg/dL (ref >39.00)
LDL Cholesterol: 193 mg/dL — ABNORMAL HIGH (ref 0–99)
NonHDL: 224.82
Total CHOL/HDL Ratio: 5
Triglycerides: 158 mg/dL — ABNORMAL HIGH (ref 0.0–149.0)
VLDL: 31.6 mg/dL (ref 0.0–40.0)

## 2019-11-22 LAB — COMPREHENSIVE METABOLIC PANEL
ALT: 22 U/L (ref 0–35)
AST: 23 U/L (ref 0–37)
Albumin: 4.7 g/dL (ref 3.5–5.2)
Alkaline Phosphatase: 99 U/L (ref 39–117)
BUN: 8 mg/dL (ref 6–23)
CO2: 29 mEq/L (ref 19–32)
Calcium: 9.5 mg/dL (ref 8.4–10.5)
Chloride: 103 mEq/L (ref 96–112)
Creatinine, Ser: 0.85 mg/dL (ref 0.40–1.20)
GFR: 66.91 mL/min (ref >60.00)
Glucose, Bld: 110 mg/dL — ABNORMAL HIGH (ref 70–99)
Potassium: 4.1 mEq/L (ref 3.5–5.1)
Sodium: 142 mEq/L (ref 135–145)
Total Bilirubin: 0.8 mg/dL (ref 0.2–1.2)
Total Protein: 6.5 g/dL (ref 6.0–8.3)

## 2019-11-22 LAB — TSH: TSH: 2.09 u[IU]/mL (ref 0.35–4.50)

## 2019-11-22 LAB — VITAMIN D 25 HYDROXY (VIT D DEFICIENCY, FRACTURES): VITD: 25.13 ng/mL — ABNORMAL LOW (ref 30.00–100.00)

## 2019-12-07 NOTE — Progress Notes (Signed)
Cardiology Office Note    Date:  9/52/8413   ID:  [name], DOB [birth year], MRN [id number]  PCP:  Darreld Mclean, MD  Cardiologist: Ena Dawley, MD EPS: None  No chief complaint on file.   History of Present Illness:  [name] is a 64 y.o. 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 Coronary CTA 11/11/17 Calcium score 0 and normal coronary arteries.  Last saw [name] 10/27/17 amlodipine added for elevated BP.   Had  rotator cuff repair by Dr. Veverly Fells  2 days ago. In a lot of pain. Denies chest pain, palpitations. BP goes high in afternoon 165-170/90 and legs become swolllen. Feels pin pricking and red dots on her legs.When BP high she has pain in epigastric area all the way up her esophagus and on left side. Was started on Protonix and feeling better. Denies chest pain with activity. LDL 193 11/22/19-didn't tolerate crestor-body aches, nausea so stopped it. Also stopped Xarelto for shoulder surgery and hasn't restarted it yet.  Past Medical History:  Diagnosis Date  . Cancer ([place]) 08/03/2010   colon  . 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 HLD (hyperlipidemia)   . Hypertension   . Uterine fibroid     Past Surgical History:  Procedure Laterality Date  . ABDOMINAL HYSTERECTOMY    . CHOLECYSTECTOMY    . COLON SURGERY    . GALLBLADDER SURGERY    . MEDIASTERNOTOMY N/A 11/24/2017   Procedure: PARTIAL STERNOTOMY;  Surgeon: [name], MD;  Location: [facility];  Service: Thoracic;  Laterality: N/A;  . THYMECTOMY  11/24/2017   Procedure: THYMECTOMY;  Surgeon: [name], MD;  Location: [facility] OR;  Service: Thoracic;;    Current Medications: Current Meds  Medication Sig  . amLODipine (NORVASC) 10 MG tablet Take 1 tablet (10 mg total) by mouth daily.  Marland Kitchen lisinopril  (ZESTRIL) 40 MG tablet TAKE 1 TABLET BY MOUTH EVERY DAY  . methocarbamol (ROBAXIN) 500 MG tablet Take 1 tablet by mouth as needed.  Marland Kitchen oxyCODONE-acetaminophen (PERCOCET/ROXICET) 5-325 MG tablet Take 1 tablet by mouth as needed.  . pantoprazole (PROTONIX) 40 MG tablet Take 1 tablet (40 mg total) by mouth daily.  . rivaroxaban (XARELTO) 20 MG TABS tablet TAKE 1 TABLET BY MOUTH DAILY WITH SUPPER  . rosuvastatin (CRESTOR) 20 MG tablet Take 1 tablet (20 mg total) by mouth daily.  . Vitamin D, Ergocalciferol, (DRISDOL) 1.25 MG (50000 UNIT) CAPS capsule TAKE 1 CAPSULE BY MOUTH EVERY 7 (SEVEN) DAYS.     Allergies:   Patient has no known allergies.   Social History   Socioeconomic History  . Marital status: Married    Spouse name: Not on file  . Number of children: 1  . Years of education: Bachelors  . Highest education level: Not on file  Occupational History  . Not on file  Tobacco Use  . Smoking status: Never Smoker  . Smokeless tobacco: Never Used  Substance and Sexual Activity  . Alcohol use: No    Alcohol/week: 0.0 standard drinks  . Drug use: No  . Sexual activity: Not on file  Other Topics Concern  . Not on file  Social History Narrative   Lives at home with husband and son.   Right-handed.   No caffeine use.   Social Determinants of Health  Financial Resource Strain:   . Difficulty of Paying Living Expenses:   Food Insecurity:   . Worried About Charity fundraiser in the Last Year:   . Arboriculturist in the Last Year:   Transportation Needs:   . Film/video editor (Medical):   Marland Kitchen Lack of Transportation (Non-Medical):   Physical Activity:   . Days of Exercise per Week:   . Minutes of Exercise per Session:   Stress:   . Feeling of Stress :   Social Connections:   . Frequency of Communication with Friends and Family:   . Frequency of Social Gatherings with Friends and Family:   . Attends Religious Services:   . Active Member of Clubs or Organizations:   .  Attends Archivist Meetings:   Marland Kitchen Marital Status:      Family History:  The patient's family history includes Heart attack in her mother; Hypertension in her father.   ROS:   Please see the history of present illness.    ROS All other systems reviewed and are negative.   PHYSICAL EXAM:   VS:  BP (!) 142/76   Pulse 66   Ht _0  (1.702 m)   Wt 200 lb (90.7 kg)   SpO2 97%   BMI 31.32 kg/m   Physical Exam  GEN: Well nourished, well developed, in no acute distress  Neck: no JVD, carotid bruits, or masses Cardiac:RRR; no murmurs, rubs, or gallops  Respiratory:  clear to auscultation bilaterally, normal work of breathing GI: soft, nontender, nondistended, + BS Ext: Right arm in brace lower extremities without cyanosis, clubbing, or edema, Good distal pulses bilaterally Neuro:  Alert and Oriented x 3 Psych: euthymic mood, full affect  Wt Readings from Last 3 Encounters:  12/08/19 200 lb (90.7 kg)  11/22/19 197 lb (89.4 kg)  07/28/19 195 lb (88.5 kg)      Studies/Labs Reviewed:   EKG:  EKG is not ordered today.    Recent Labs: 11/22/2019: ALT 22; BUN 8; Creatinine, Ser 0.85; Hemoglobin 13.7; Platelets 172.0; Potassium 4.1; Sodium 142; TSH 2.09   Lipid Panel    Component Value Date/Time   CHOL 278 (H) 11/22/2019 1323   TRIG 158.0 (H) 11/22/2019 1323   HDL 53.10 11/22/2019 1323   CHOLHDL 5 11/22/2019 1323   VLDL 31.6 11/22/2019 1323   LDLCALC 193 (H) 11/22/2019 1323    Additional studies/ records that were reviewed today include:  Coronary CTA 11/2017 IMPRESSION: 1. Coronary calcium score of 0. This was 0 percentile for age and sex matched control.   2. Normal coronary origin with right dominance.   3. The study is significantly affected by motion, however there is no significant plaque in any of the coronary arteries.   Electronically Signed: By: Ena Dawley On: 11/11/2017 15:25       ASSESSMENT:    1. Essential hypertension   2. History  of chest pain   3. 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 Hyperlipidemia, unspecified hyperlipidemia type      PLAN:  In order of problems listed above:  HTN blood pressure has been running high in the afternoons associated with leg edema.  We will add HCTZ 25 mg once daily to her medications.  Follow-up blood pressure and be met in 3 to 4 weeks  History of chest pain with coronary CTA  11/2017 calcium score 0 and normal coronaries.  History of  recurrent DVT on Xarelto followed by Dr.Mohamed-patient had stopped preop for shoulder surgery done 2 days ago and has not restarted.  I told her she needs to restart her Xarelto.  Recent creatinine and CBC were stable  HLD LDL 224 chol 278 HDL 53 on crestor but she was not taking because of body aches and nausea.  She does have a calcium score of 0 in 2019 but with lipids as high will refer to lipid clinic.  Medication Adjustments/Labs and Tests Ordered: Current medicines are reviewed at length with the patient today.  Concerns regarding medicines are outlined above.  Medication changes, Labs and Tests ordered today are listed in the Patient Instructions below. Patient Instructions   Medication Instructions:  Your physician has recommended you make the following change in your medication:   START: hydrochlorothiazide 25 mg tablet: Take 1 tablet by mouth once a day  RESTART: xarelto  *If you need a refill on your cardiac medications before your next appointment, please call your pharmacy*   Lab Work: Your physician recommends that you return for lab work Artist) on the same day as you appointment with Ermalinda Barrios, PA  If you have labs (blood work) drawn today and your tests are completely normal, you will receive your results only by: Marland Kitchen MyChart Message (if you have MyChart) OR . A paper copy in the mail If you have any lab test that is abnormal or we need to change your  treatment, we will call you to review the results.   Testing/Procedures: None ordered   Follow-Up: Follow up in the [facility] on 12/29/19 at 10:30 AM   Follow up with Ermalinda Barrios, PA on 01/04/20 at 11:30 AM   Other Instructions Two Gram Sodium Diet 2000 mg  What is Sodium? Sodium is a mineral found naturally in many foods. The most significant source of sodium in the diet is table salt, which is about 40% sodium.  Processed, convenience, and preserved foods also contain a large amount of sodium.  The body needs only 500 mg of sodium daily to function,  A normal diet provides more than enough sodium even if you do not use salt.  Why Limit Sodium? A build up of sodium in the body can cause thirst, increased blood pressure, shortness of breath, and water retention.  Decreasing sodium in the diet can reduce edema and risk of heart attack or stroke associated with high blood pressure.  Keep in mind that there are many other factors involved in these health problems.  Heredity, obesity, lack of exercise, cigarette smoking, stress and what you eat all play a role.  General Guidelines:  Do not add salt at the table or in cooking.  One teaspoon of salt contains over 2 grams of sodium.  Read food labels  Avoid processed and convenience foods  Ask your dietitian before eating any foods not dicussed in the menu planning guidelines  Consult your physician if you wish to use a salt substitute or a sodium containing medication such as antacids.  Limit milk and milk products to 16 oz (2 cups) per day.  Shopping Hints:  READ LABELS!! "Dietetic" does not necessarily mean low sodium.  Salt and other sodium ingredients are often added to foods during processing.   Menu Planning Guidelines Food Group Choose More Often Avoid  Beverages (see also the milk group All fruit juices, low-sodium, salt-free vegetables juices, low-sodium carbonated beverages Regular vegetable or tomato juices,  commercially softened water used for drinking or  cooking  Breads and Cereals Enriched white, wheat, rye and pumpernickel bread, hard rolls and dinner rolls; muffins, cornbread and waffles; most dry cereals, cooked cereal without added salt; unsalted crackers and breadsticks; low sodium or homemade bread crumbs Bread, rolls and crackers with salted tops; quick breads; instant hot cereals; pancakes; commercial bread stuffing; self-rising flower and biscuit mixes; regular bread crumbs or cracker crumbs  Desserts and Sweets Desserts and sweets mad with mild should be within allowance Instant pudding mixes and cake mixes  Fats Butter or margarine; vegetable oils; unsalted salad dressings, regular salad dressings limited to 1 Tbs; light, sour and heavy cream Regular salad dressings containing bacon fat, bacon bits, and salt pork; snack dips made with instant soup mixes or processed cheese; salted nuts  Fruits Most fresh, frozen and canned fruits Fruits processed with salt or sodium-containing ingredient (some dried fruits are processed with sodium sulfites        Vegetables Fresh, frozen vegetables and low- sodium canned vegetables Regular canned vegetables, sauerkraut, pickled vegetables, and others prepared in brine; frozen vegetables in sauces; vegetables seasoned with ham, bacon or salt pork  Condiments, Sauces, Miscellaneous  Salt substitute with physician's approval; pepper, herbs, spices; vinegar, lemon or lime juice; hot pepper sauce; garlic powder, onion powder, low sodium soy sauce (1 Tbs.); low sodium condiments (ketchup, chili sauce, mustard) in limited amounts (1 tsp.) fresh ground horseradish; unsalted tortilla chips, pretzels, potato chips, popcorn, salsa (1/4 cup) Any seasoning made with salt including garlic salt, celery salt, onion salt, and seasoned salt; sea salt, rock salt, kosher salt; meat tenderizers; monosodium glutamate; mustard, regular soy sauce, barbecue, sauce, chili sauce,  teriyaki sauce, steak sauce, Worcestershire sauce, and most flavored vinegars; canned gravy and mixes; regular condiments; salted snack foods, olives, picles, relish, horseradish sauce, catsup   Food preparation: Try these seasonings Meats:    Pork Sage, onion Serve with applesauce  Chicken Poultry seasoning, thyme, parsley Serve with cranberry sauce  Lamb Curry powder, rosemary, garlic, thyme Serve with mint sauce or jelly  Veal Marjoram, basil Serve with current jelly, cranberry sauce  Beef Pepper, bay leaf Serve with dry mustard, unsalted chive butter  Fish [place] leaf, dill Serve with unsalted lemon butter, unsalted parsley butter  Vegetables:    Asparagus Lemon juice   Broccoli Lemon juice   Carrots Mustard dressing parsley, mint, nutmeg, glazed with unsalted butter and sugar   Green beans Marjoram, lemon juice, nutmeg,dill seed   Tomatoes Basil, marjoram, onion   Spice /blend for [employer]" 4 tsp ground thyme 1 tsp ground sage 3 tsp ground rosemary 4 tsp ground marjoram   Test your knowledge 1. A product that says "Salt Free" may still contain sodium. True or False 2. Garlic Powder and Hot Pepper Sauce an be used as alternative seasonings.True or False 3. Processed foods have more sodium than fresh foods.  True or False 4. Canned Vegetables have less sodium than froze True or False  WAYS TO DECREASE YOUR SODIUM INTAKE 1. Avoid the use of added salt in cooking and at the table.  Table salt (and other prepared seasonings which contain salt) is probably one of the greatest sources of sodium in the diet.  Unsalted foods can gain flavor from the sweet, sour, and butter taste sensations of herbs and spices.  Instead of using salt for seasoning, try the following seasonings with the foods listed.  Remember: how you use them to enhance natural food flavors is limited only by your creativity... Allspice-Meat, fish,  eggs, fruit, peas, red and yellow vegetables Almond Extract-Fruit baked  goods Anise Seed-Sweet breads, fruit, carrots, beets, cottage cheese, cookies (tastes like licorice) Basil-Meat, fish, eggs, vegetables, rice, vegetables salads, soups, sauces Bay Leaf-Meat, fish, stews, poultry Burnet-Salad, vegetables (cucumber-like flavor) Caraway Seed-Bread, cookies, cottage cheese, meat, vegetables, cheese, rice Cardamon-Baked goods, fruit, soups Celery Powder or seed-Salads, salad dressings, sauces, meatloaf, soup, bread.Do not use  celery salt Chervil-Meats, salads, fish, eggs, vegetables, cottage cheese (parsley-like flavor) Chili Power-Meatloaf, chicken cheese, corn, eggplant, egg dishes Chives-Salads cottage cheese, egg dishes, soups, vegetables, sauces Cilantro-Salsa, casseroles Cinnamon-Baked goods, fruit, pork, lamb, chicken, carrots Cloves-Fruit, baked goods, fish, pot roast, green beans, beets, carrots Coriander-Pastry, cookies, meat, salads, cheese (lemon-orange flavor) Cumin-Meatloaf, fish,cheese, eggs, cabbage,fruit pie (caraway flavor) [employer], fruit, eggs, fish, poultry, cottage cheese, vegetables Dill Seed-Meat, cottage cheese, poultry, vegetables, fish, salads, bread Fennel Seed-Bread, cookies, apples, pork, eggs, fish, beets, cabbage, cheese, Licorice-like flavor Garlic-(buds or powder) Salads, meat, poultry, fish, bread, butter, vegetables, potatoes.Do not  use garlic salt Ginger-Fruit, vegetables, baked goods, meat, fish, poultry Horseradish Root-Meet, vegetables, butter Lemon Juice or Extract-Vegetables, fruit, tea, baked goods, fish salads Mace-Baked goods fruit, vegetables, fish, poultry (taste like nutmeg) Maple Extract-Syrups Marjoram-Meat, chicken, fish, vegetables, breads, green salads (taste like Sage) Mint-Tea, lamb, sherbet, vegetables, desserts, carrots, cabbage Mustard, Dry or Seed-Cheese, eggs, meats, vegetables, poultry Nutmeg-Baked goods, fruit, chicken, eggs, vegetables, desserts Onion Powder-Meat, fish, poultry,  vegetables, cheese, eggs, bread, rice salads (Do not use   Onion salt) Orange Extract-Desserts, baked goods Oregano-Pasta, eggs, cheese, onions, pork, lamb, fish, chicken, vegetables, green salads Paprika-Meat, fish, poultry, eggs, cheese, vegetables Parsley Flakes-Butter, vegetables, meat fish, poultry, eggs, bread, salads (certain forms may   Contain sodium Pepper-Meat fish, poultry, vegetables, eggs Peppermint Extract-Desserts, baked goods Poppy Seed-Eggs, bread, cheese, fruit dressings, baked goods, noodles, vegetables, cottage  [employer], poultry, meat, fish, cauliflower, turnips,eggs bread Saffron-Rice, bread, veal, chicken, fish, eggs Sage-Meat, fish, poultry, onions, eggplant, tomateos, pork, stews Savory-Eggs, salads, poultry, meat, rice, vegetables, soups, pork Tarragon-Meat, poultry, fish, eggs, butter, vegetables (licorice-like flavor)  Thyme-Meat, poultry, fish, eggs, vegetables, (clover-like flavor), sauces, soups Tumeric-Salads, butter, eggs, fish, rice, vegetables (saffron-like flavor) Vanilla Extract-Baked goods, candy Vinegar-Salads, vegetables, meat marinades Walnut Extract-baked goods, candy  2. Choose your Foods Wisely   The following is a list of foods to avoid which are high in sodium:  Meats-Avoid all smoked, canned, salt cured, dried and kosher meat and fish as well as Anchovies   [employer] meats:Bologna, Liverwurst, Pastrami Canned meat or fish  Marinated herring Caviar    Pepperoni Corned Beef   Pizza Dried chipped beef  Salami Frozen breaded fish or meat Salt pork Frankfurters or hot dogs  Sardines Gefilte fish   Sausage Ham (boiled ham, Proscuitto Smoked butt    spiced ham)   Spam      TV Dinners Vegetables Canned vegetables (Regular) Relish Canned mushrooms  Sauerkraut Olives    Tomato juice Pickles  Bakery and Dessert Products Canned puddings  Cream pies Cheesecake   Decorated  cakes Cookies  Beverages/Juices Tomato juice, regular  Gatorade   V-8 vegetable juice, regular  Breads and Cereals Biscuit mixes   Salted potato chips, corn chips, pretzels Bread stuffing mixes  Salted crackers and rolls Pancake and waffle mixes Self-rising flour  Seasonings Accent    Meat sauces Barbecue sauce  Meat tenderizer Catsup    Monosodium glutamate (MSG) Celery salt   Onion salt Chili sauce   Prepared  mustard Garlic salt   Salt, seasoned salt, sea salt Gravy mixes   Soy sauce Horseradish   Steak sauce Ketchup   Tartar sauce Lite salt    Teriyaki sauce Marinade mixes   Worcestershire sauce  Others Baking powder   Cocoa and cocoa mixes Baking soda   Commercial casserole mixes Candy-caramels, chocolate  Dehydrated soups    Bars, fudge,nougats  Instant rice and pasta mixes Canned broth or soup  Maraschino cherries Cheese, aged and processed cheese and cheese spreads  Learning Assessment Quiz  Indicated T (for True) or F (for False) for each of the following statements:  1. _____ Fresh fruits and vegetables and unprocessed grains are generally low in sodium 2. _____ Water may contain a considerable amount of sodium, depending on the source 3. _____ You can always tell if a food is high in sodium by tasting it 4. _____ Certain laxatives my be high in sodium and should be avoided unless prescribed   by a physician or pharmacist 5. _____ Salt substitutes may be used freely by anyone on a sodium restricted diet 6. _____ Sodium is present in table salt, food additives and as a natural component of   most foods 7. _____ Table salt is approximately 90% sodium 8. _____ Limiting sodium intake may help prevent excess fluid accumulation in the body 9. _____ On a sodium-restricted diet, seasonings such as bouillon soy sauce, and    cooking wine should be used in place of table salt 10. _____ On an ingredient list, a product which lists monosodium glutamate as the first    ingredient is an appropriate food to include on a low sodium diet  Circle the best answer(s) to the following statements (Hint: there may be more than one correct answer)  11. On a low-sodium diet, some acceptable snack items are:    A. Olives  F. Bean dip   K. Grapefruit juice    B. Salted Pretzels G. Commercial Popcorn   L. Canned peaches    C. Carrot Sticks  H. Bouillon   M. Unsalted nuts   D. [place] fries  I. Peanut butter crackers N. Salami   E. Sweet pickles J. Tomato Juice   O. Pizza  12.  Seasonings that may be used freely on a reduced - sodium diet include   A. Lemon wedges F.Monosodium glutamate K. Celery seed    B.Soysauce   G. Pepper   L. Mustard powder   C. Sea salt  H. Cooking wine  M. Onion flakes   D. Vinegar  E. Prepared horseradish N. Salsa   E. Sage   J. Worcestershire sauce  O. [address]      Sumner Boast, PA-C  12/08/2019 11:39 AM    [facility] [address], [place], [place]  [address] Phone: [phone]; Fax: [phone]

## 2019-12-08 ENCOUNTER — Encounter: Payer: Self-pay | Admitting: Physician Assistant

## 2019-12-08 ENCOUNTER — Ambulatory Visit (INDEPENDENT_AMBULATORY_CARE_PROVIDER_SITE_OTHER): Payer: 59 | Admitting: Physician Assistant

## 2019-12-08 ENCOUNTER — Other Ambulatory Visit: Payer: Self-pay

## 2019-12-08 VITALS — BP 142/76 | HR 66 | Ht 67.0 in | Wt 200.0 lb

## 2019-12-08 DIAGNOSIS — I825Z9 Chronic embolism and thrombosis of unspecified deep veins of unspecified distal lower extremity: Secondary | ICD-10-CM | POA: Diagnosis not present

## 2019-12-08 DIAGNOSIS — Z87898 Personal history of other specified conditions: Secondary | ICD-10-CM | POA: Diagnosis not present

## 2019-12-08 DIAGNOSIS — E785 Hyperlipidemia, unspecified: Secondary | ICD-10-CM | POA: Diagnosis not present

## 2019-12-08 DIAGNOSIS — I10 Essential (primary) hypertension: Secondary | ICD-10-CM | POA: Diagnosis not present

## 2019-12-08 MED ORDER — HYDROCHLOROTHIAZIDE 25 MG PO TABS: 25.0000 mg | ORAL_TABLET | Freq: Every day | ORAL | 3 refills | Status: DC

## 2019-12-08 NOTE — Patient Instructions (Signed)
Medication Instructions:  Your physician has recommended you make the following change in your medication:   START: hydrochlorothiazide 25 mg tablet: Take 1 tablet by mouth once a day  RESTART: xarelto  *If you need a refill on your cardiac medications before your next appointment, please call your pharmacy*   Lab Work: Your physician recommends that you return for lab work Artist) on the same day as you appointment with Ermalinda Barrios, PA  If you have labs (blood work) drawn today and your tests are completely normal, you will receive your results only by: Marland Kitchen MyChart Message (if you have MyChart) OR . A paper copy in the mail If you have any lab test that is abnormal or we need to change your treatment, we will call you to review the results.   Testing/Procedures: None ordered   Follow-Up: Follow up in the [facility] on 12/29/19 at 10:30 AM   Follow up with Ermalinda Barrios, PA on 01/04/20 at 11:30 AM   Other Instructions Two Gram Sodium Diet 2000 mg  What is Sodium? Sodium is a mineral found naturally in many foods. The most significant source of sodium in the diet is table salt, which is about 40% sodium.  Processed, convenience, and preserved foods also contain a large amount of sodium.  The body needs only 500 mg of sodium daily to function,  A normal diet provides more than enough sodium even if you do not use salt.  Why Limit Sodium? A build up of sodium in the body can cause thirst, increased blood pressure, shortness of breath, and water retention.  Decreasing sodium in the diet can reduce edema and risk of heart attack or stroke associated with high blood pressure.  Keep in mind that there are many other factors involved in these health problems.  Heredity, obesity, lack of exercise, cigarette smoking, stress and what you eat all play a role.  General Guidelines:  Do not add salt at the table or in cooking.  One teaspoon of salt contains over 2 grams of sodium.  Read food  labels  Avoid processed and convenience foods  Ask your dietitian before eating any foods not dicussed in the menu planning guidelines  Consult your physician if you wish to use a salt substitute or a sodium containing medication such as antacids.  Limit milk and milk products to 16 oz (2 cups) per day.  Shopping Hints:  READ LABELS!! "Dietetic" does not necessarily mean low sodium.  Salt and other sodium ingredients are often added to foods during processing.   Menu Planning Guidelines Food Group Choose More Often Avoid  Beverages (see also the milk group All fruit juices, low-sodium, salt-free vegetables juices, low-sodium carbonated beverages Regular vegetable or tomato juices, commercially softened water used for drinking or cooking  Breads and Cereals Enriched white, wheat, rye and pumpernickel bread, hard rolls and dinner rolls; muffins, cornbread and waffles; most dry cereals, cooked cereal without added salt; unsalted crackers and breadsticks; low sodium or homemade bread crumbs Bread, rolls and crackers with salted tops; quick breads; instant hot cereals; pancakes; commercial bread stuffing; self-rising flower and biscuit mixes; regular bread crumbs or cracker crumbs  Desserts and Sweets Desserts and sweets mad with mild should be within allowance Instant pudding mixes and cake mixes  Fats Butter or margarine; vegetable oils; unsalted salad dressings, regular salad dressings limited to 1 Tbs; light, sour and heavy cream Regular salad dressings containing bacon fat, bacon bits, and salt pork; snack dips made with  instant soup mixes or processed cheese; salted nuts  Fruits Most fresh, frozen and canned fruits Fruits processed with salt or sodium-containing ingredient (some dried fruits are processed with sodium sulfites        Vegetables Fresh, frozen vegetables and low- sodium canned vegetables Regular canned vegetables, sauerkraut, pickled vegetables, and others prepared in  brine; frozen vegetables in sauces; vegetables seasoned with ham, bacon or salt pork  Condiments, Sauces, Miscellaneous  Salt substitute with physician's approval; pepper, herbs, spices; vinegar, lemon or lime juice; hot pepper sauce; garlic powder, onion powder, low sodium soy sauce (1 Tbs.); low sodium condiments (ketchup, chili sauce, mustard) in limited amounts (1 tsp.) fresh ground horseradish; unsalted tortilla chips, pretzels, potato chips, popcorn, salsa (1/4 cup) Any seasoning made with salt including garlic salt, celery salt, onion salt, and seasoned salt; sea salt, rock salt, kosher salt; meat tenderizers; monosodium glutamate; mustard, regular soy sauce, barbecue, sauce, chili sauce, teriyaki sauce, steak sauce, Worcestershire sauce, and most flavored vinegars; canned gravy and mixes; regular condiments; salted snack foods, olives, picles, relish, horseradish sauce, catsup   Food preparation: Try these seasonings Meats:    Pork Sage, onion Serve with applesauce  Chicken Poultry seasoning, thyme, parsley Serve with cranberry sauce  Lamb Curry powder, rosemary, garlic, thyme Serve with mint sauce or jelly  Veal Marjoram, basil Serve with current jelly, cranberry sauce  Beef Pepper, bay leaf Serve with dry mustard, unsalted chive butter  Fish [place] leaf, dill Serve with unsalted lemon butter, unsalted parsley butter  Vegetables:    Asparagus Lemon juice   Broccoli Lemon juice   Carrots Mustard dressing parsley, mint, nutmeg, glazed with unsalted butter and sugar   Green beans Marjoram, lemon juice, nutmeg,dill seed   Tomatoes Basil, marjoram, onion   Spice /blend for [employer]" 4 tsp ground thyme 1 tsp ground sage 3 tsp ground rosemary 4 tsp ground marjoram   Test your knowledge 1. A product that says "Salt Free" may still contain sodium. True or False 2. Garlic Powder and Hot Pepper Sauce an be used as alternative seasonings.True or False 3. Processed foods have more sodium than  fresh foods.  True or False 4. Canned Vegetables have less sodium than froze True or False  WAYS TO DECREASE YOUR SODIUM INTAKE 1. Avoid the use of added salt in cooking and at the table.  Table salt (and other prepared seasonings which contain salt) is probably one of the greatest sources of sodium in the diet.  Unsalted foods can gain flavor from the sweet, sour, and butter taste sensations of herbs and spices.  Instead of using salt for seasoning, try the following seasonings with the foods listed.  Remember: how you use them to enhance natural food flavors is limited only by your creativity... Allspice-Meat, fish, eggs, fruit, peas, red and yellow vegetables Almond Extract-Fruit baked goods Anise Seed-Sweet breads, fruit, carrots, beets, cottage cheese, cookies (tastes like licorice) Basil-Meat, fish, eggs, vegetables, rice, vegetables salads, soups, sauces Bay Leaf-Meat, fish, stews, poultry Burnet-Salad, vegetables (cucumber-like flavor) Caraway Seed-Bread, cookies, cottage cheese, meat, vegetables, cheese, rice Cardamon-Baked goods, fruit, soups Celery Powder or seed-Salads, salad dressings, sauces, meatloaf, soup, bread.Do not use  celery salt Chervil-Meats, salads, fish, eggs, vegetables, cottage cheese (parsley-like flavor) Chili Power-Meatloaf, chicken cheese, corn, eggplant, egg dishes Chives-Salads cottage cheese, egg dishes, soups, vegetables, sauces Cilantro-Salsa, casseroles Cinnamon-Baked goods, fruit, pork, lamb, chicken, carrots Cloves-Fruit, baked goods, fish, pot roast, green beans, beets, carrots Coriander-Pastry, cookies, meat, salads, cheese (lemon-orange flavor) Cumin-Meatloaf, fish,cheese,  eggs, cabbage,fruit pie (caraway flavor) [employer], fruit, eggs, fish, poultry, cottage cheese, vegetables Dill Seed-Meat, cottage cheese, poultry, vegetables, fish, salads, bread Fennel Seed-Bread, cookies, apples, pork, eggs, fish, beets, cabbage, cheese, Licorice-like  flavor Garlic-(buds or powder) Salads, meat, poultry, fish, bread, butter, vegetables, potatoes.Do not  use garlic salt Ginger-Fruit, vegetables, baked goods, meat, fish, poultry Horseradish Root-Meet, vegetables, butter Lemon Juice or Extract-Vegetables, fruit, tea, baked goods, fish salads Mace-Baked goods fruit, vegetables, fish, poultry (taste like nutmeg) Maple Extract-Syrups Marjoram-Meat, chicken, fish, vegetables, breads, green salads (taste like Sage) Mint-Tea, lamb, sherbet, vegetables, desserts, carrots, cabbage Mustard, Dry or Seed-Cheese, eggs, meats, vegetables, poultry Nutmeg-Baked goods, fruit, chicken, eggs, vegetables, desserts Onion Powder-Meat, fish, poultry, vegetables, cheese, eggs, bread, rice salads (Do not use   Onion salt) Orange Extract-Desserts, baked goods Oregano-Pasta, eggs, cheese, onions, pork, lamb, fish, chicken, vegetables, green salads Paprika-Meat, fish, poultry, eggs, cheese, vegetables Parsley Flakes-Butter, vegetables, meat fish, poultry, eggs, bread, salads (certain forms may   Contain sodium Pepper-Meat fish, poultry, vegetables, eggs Peppermint Extract-Desserts, baked goods Poppy Seed-Eggs, bread, cheese, fruit dressings, baked goods, noodles, vegetables, cottage  [employer], poultry, meat, fish, cauliflower, turnips,eggs bread Saffron-Rice, bread, veal, chicken, fish, eggs Sage-Meat, fish, poultry, onions, eggplant, tomateos, pork, stews Savory-Eggs, salads, poultry, meat, rice, vegetables, soups, pork Tarragon-Meat, poultry, fish, eggs, butter, vegetables (licorice-like flavor)  Thyme-Meat, poultry, fish, eggs, vegetables, (clover-like flavor), sauces, soups Tumeric-Salads, butter, eggs, fish, rice, vegetables (saffron-like flavor) Vanilla Extract-Baked goods, candy Vinegar-Salads, vegetables, meat marinades Walnut Extract-baked goods, candy  2. Choose your Foods Wisely   The following is a list  of foods to avoid which are high in sodium:  Meats-Avoid all smoked, canned, salt cured, dried and kosher meat and fish as well as Anchovies   [employer] meats:Bologna, Liverwurst, Pastrami Canned meat or fish  Marinated herring Caviar    Pepperoni Corned Beef   Pizza Dried chipped beef  Salami Frozen breaded fish or meat Salt pork Frankfurters or hot dogs  Sardines Gefilte fish   Sausage Ham (boiled ham, Proscuitto Smoked butt    spiced ham)   Spam      TV Dinners Vegetables Canned vegetables (Regular) Relish Canned mushrooms  Sauerkraut Olives    Tomato juice Pickles  Bakery and Dessert Products Canned puddings  Cream pies Cheesecake   Decorated cakes Cookies  Beverages/Juices Tomato juice, regular  Gatorade   V-8 vegetable juice, regular  Breads and Cereals Biscuit mixes   Salted potato chips, corn chips, pretzels Bread stuffing mixes  Salted crackers and rolls Pancake and waffle mixes Self-rising flour  Seasonings Accent    Meat sauces Barbecue sauce  Meat tenderizer Catsup    Monosodium glutamate (MSG) Celery salt   Onion salt Chili sauce   Prepared mustard Garlic salt   Salt, seasoned salt, sea salt Gravy mixes   Soy sauce Horseradish   Steak sauce Ketchup   Tartar sauce Lite salt    Teriyaki sauce Marinade mixes   Worcestershire sauce  Others Baking powder   Cocoa and cocoa mixes Baking soda   Commercial casserole mixes Candy-caramels, chocolate  Dehydrated soups    Bars, fudge,nougats  Instant rice and pasta mixes Canned broth or soup  Maraschino cherries Cheese, aged and processed cheese and cheese spreads  Learning Assessment Quiz  Indicated T (for True) or F (for False) for each of the following statements:  1. _____ Fresh fruits and vegetables and unprocessed grains are generally low in sodium 2. _____  Water may contain a considerable amount of sodium, depending on the source 3. _____ You can always tell if a food is high in sodium  by tasting it 4. _____ Certain laxatives my be high in sodium and should be avoided unless prescribed   by a physician or pharmacist 5. _____ Salt substitutes may be used freely by anyone on a sodium restricted diet 6. _____ Sodium is present in table salt, food additives and as a natural component of   most foods 7. _____ Table salt is approximately 90% sodium 8. _____ Limiting sodium intake may help prevent excess fluid accumulation in the body 9. _____ On a sodium-restricted diet, seasonings such as bouillon soy sauce, and    cooking wine should be used in place of table salt 10. _____ On an ingredient list, a product which lists monosodium glutamate as the first   ingredient is an appropriate food to include on a low sodium diet  Circle the best answer(s) to the following statements (Hint: there may be more than one correct answer)  11. On a low-sodium diet, some acceptable snack items are:    A. Olives  F. Bean dip   K. Grapefruit juice    B. Salted Pretzels G. Commercial Popcorn   L. Canned peaches    C. Carrot Sticks  H. Bouillon   M. Unsalted nuts   D. [place] fries  I. Peanut butter crackers N. Salami   E. Sweet pickles J. Tomato Juice   O. Pizza  12.  Seasonings that may be used freely on a reduced - sodium diet include   A. Lemon wedges F.Monosodium glutamate K. Celery seed    B.Soysauce   G. Pepper   L. Mustard powder   C. Sea salt  H. Cooking wine  M. Onion flakes   D. Vinegar  E. Prepared horseradish N. Salsa   E. Sage   J. Worcestershire sauce  O. Chutney

## 2019-12-20 ENCOUNTER — Other Ambulatory Visit: Payer: Self-pay | Admitting: Family Medicine

## 2019-12-20 DIAGNOSIS — E559 Vitamin D deficiency, unspecified: Secondary | ICD-10-CM

## 2019-12-21 ENCOUNTER — Encounter: Payer: Self-pay | Admitting: Family Medicine

## 2019-12-21 NOTE — Telephone Encounter (Signed)
Dr Lorelei Pont -- please advise Vit D refill request?  Last lab result said for pt to take 2000 IU daily. Is that in addition to 50,000 units weekly or to replace the once weekly RX?

## 2019-12-28 NOTE — Progress Notes (Signed)
Cardiology Office Note    Date:  5/42/7062   ID:  Kendra, Torres [birth year], MRN [id number]  PCP:  Darreld Mclean, MD  Cardiologist: Ena Dawley, MD EPS: None  Chief Complaint  Patient presents with  . Follow-up    History of Present Illness:  [name] is a 64 y.o. 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 Coronary CTA 11/11/17 Calcium score 0 and normal coronary arteries.   Last saw [name] 10/27/17 amlodipine added for elevated BP.    I saw the patient 12/08/2019 2 days after rotator cuff repair and she was in quite a bit of pain.  Blood pressure was running high.  She was also having some epigastric pain that improved with Protonix.  She had stopped Crestor because of body aches and nausea.  Also stopped Xarelto for surgery and had not restarted it.  I added HCTZ 25 mg once daily and asked her to restart her Xarelto and referred her to lipid clinic.  BP still running high 175/90 in the afternoon. Stopped HCTZ last week because she says it wasn't helping. She checked her BP 1,2, 3 hrs after she took her HCTZ and said BP remained high. Still recovering from rotator cuff surgery and not getting as much exercise. Fearful of covid19 vaccine.  Past Medical History:  Diagnosis Date  . Cancer ([place]) 08/03/2010   colon  . 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 History of colon cancer, stage I 08/17/2015  . HLD (hyperlipidemia)   . Hypertension   . Prediabetes 05/25/2019  . Uterine fibroid     Past Surgical History:  Procedure Laterality Date  . ABDOMINAL HYSTERECTOMY    . CHOLECYSTECTOMY    . COLON SURGERY    . GALLBLADDER SURGERY    . MEDIASTERNOTOMY N/A 11/24/2017   Procedure: PARTIAL STERNOTOMY;  Surgeon: [name], MD;  Location: [facility];  Service: Thoracic;  Laterality:  N/A;  . THYMECTOMY  11/24/2017   Procedure: THYMECTOMY;  Surgeon: [name], MD;  Location: [facility] OR;  Service: Thoracic;;    Current Medications: Current Meds  Medication Sig  . amLODipine (NORVASC) 10 MG tablet Take 1 tablet (10 mg total) by mouth daily.  Marland Kitchen lisinopril (ZESTRIL) 40 MG tablet TAKE 1 TABLET BY MOUTH EVERY DAY  . methocarbamol (ROBAXIN) 500 MG tablet Take 1 tablet by mouth as needed.  Marland Kitchen oxyCODONE-acetaminophen (PERCOCET/ROXICET) 5-325 MG tablet Take 1 tablet by mouth as needed.  . pantoprazole (PROTONIX) 40 MG tablet Take 1 tablet (40 mg total) by mouth daily.  . rivaroxaban (XARELTO) 20 MG TABS tablet TAKE 1 TABLET BY MOUTH DAILY WITH SUPPER  . Vitamin D, Ergocalciferol, (DRISDOL) 1.25 MG (50000 UNIT) CAPS capsule TAKE 1 CAPSULE BY MOUTH EVERY 7 (SEVEN) DAYS.     Allergies:   Patient has no known allergies.   Social History   Socioeconomic History  . Marital status: Married    Spouse name: Not on file  . Number of children: 1  . Years of education: Bachelors  . Highest education level: Not on file  Occupational History  . Not on file  Tobacco Use  . Smoking status: Never Smoker  . Smokeless tobacco: Never Used  Substance and Sexual Activity  . Alcohol use: No    Alcohol/week: 0.0 standard drinks  .  Drug use: No  . Sexual activity: Not on file  Other Topics Concern  . Not on file  Social History Narrative   Lives at home with husband and son.   Right-handed.   No caffeine use.   Social Determinants of Health   Financial Resource Strain:   . Difficulty of Paying Living Expenses:   Food Insecurity:   . Worried About Charity fundraiser in the Last Year:   . Arboriculturist in the Last Year:   Transportation Needs:   . Film/video editor (Medical):   Marland Kitchen Lack of Transportation (Non-Medical):   Physical Activity:   . Days of Exercise per Week:   . Minutes of Exercise per Session:   Stress:   . Feeling of Stress :   Social Connections:     . Frequency of Communication with Friends and Family:   . Frequency of Social Gatherings with Friends and Family:   . Attends Religious Services:   . Active Member of Clubs or Organizations:   . Attends Archivist Meetings:   Marland Kitchen Marital Status:      Family History:  The patient's   family history includes Heart attack in her mother; Hypertension in her father.   ROS:   Please see the history of present illness.    ROS All other systems reviewed and are negative.   PHYSICAL EXAM:   VS:  BP (!) 148/76   Pulse 72   Ht 5\' 7"  (1.702 m)   Wt 197 lb 6.4 oz (89.5 kg)   SpO2 99%   BMI 30.92 kg/m   Physical Exam  GEN: Well nourished, well developed, in no acute distress  Neck: no JVD, carotid bruits, or masses Cardiac:RRR; no murmurs, rubs, or gallops  Respiratory:  clear to auscultation bilaterally, normal work of breathing GI: soft, nontender, nondistended, + BS Ext: without cyanosis, clubbing, or edema, Good distal pulses bilaterally Neuro:  Alert and Oriented x 3 Psych: euthymic mood, full affect  Wt Readings from Last 3 Encounters:  01/04/20 197 lb 6.4 oz (89.5 kg)  12/08/19 200 lb (90.7 kg)  11/22/19 197 lb (89.4 kg)      Studies/Labs Reviewed:   EKG:  EKG is not ordered today.   Recent Labs: 11/22/2019: ALT 22; BUN 8; Creatinine, Ser 0.85; Hemoglobin 13.7; Platelets 172.0; Potassium 4.1; Sodium 142; TSH 2.09   Lipid Panel    Component Value Date/Time   CHOL 278 (H) 11/22/2019 1323   TRIG 158.0 (H) 11/22/2019 1323   HDL 53.10 11/22/2019 1323   CHOLHDL 5 11/22/2019 1323   VLDL 31.6 11/22/2019 1323   LDLCALC 193 (H) 11/22/2019 1323    Additional studies/ records that were reviewed today include:  Coronary CTA 11/2017 IMPRESSION: 1. Coronary calcium score of 0. This was 0 percentile for age and sex matched control.   2. Normal coronary origin with right dominance.   3. The study is significantly affected by motion, however there is no significant  plaque in any of the coronary arteries.   Electronically Signed: By: Ena Dawley On: 11/11/2017 15:25           ASSESSMENT:    1. Essential hypertension   2. History of chest pain   3. 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 Hyperlipidemia, unspecified hyperlipidemia type   5. Educated about COVID-19 virus infection      PLAN:  In order of problems listed above:  Essential hypertension BP still running high mostly in the afternoon. Stopped HCTZ last week because she said it didn't help. Will try hydralazine 25 mg tid  History of chest pain with coronary CTA 11/2017 calcium score 0 normal coronaries  History of recurrent DVT on Xarelto followed by Dr. Earlie Server  HLD LDL 224 and cholesterol 278 stopped Crestor because of body aches and referred to the lipid clinic. Initially denied by insurance for repatha but appeals process in place  covid19 education given. Patient still reluctant to receive the vaccine.  Medication Adjustments/Labs and Tests Ordered: Current medicines are reviewed at length with the patient today.  Concerns regarding medicines are outlined above.  Medication changes, Labs and Tests ordered today are listed in the Patient Instructions below. Patient Instructions  Medication Instructions:  Your physician has recommended you make the following change in your medication:   START hydralazine 25 mg by mouth three times a day.   Lab Work: None today If you have labs (blood work) drawn today and your tests are completely normal, you will receive your results only by: Marland Kitchen MyChart Message (if you have MyChart) OR . A paper copy in the mail If you have any lab test that is abnormal or we need to change your treatment, we will call you to review the results.   Testing/Procedures: None today   Follow-Up: At [facility], you and your health needs are our priority.  As part of our continuing mission to provide you  with exceptional heart care, we have created designated Provider Care Teams.  These Care Teams include your primary Cardiologist (physician) and Advanced Practice Providers (APPs -  Physician Assistants and Nurse Practitioners) who all work together to provide you with the care you need, when you need it.  We recommend signing up for the patient portal called "MyChart".  Sign up information is provided on this After Visit Summary.  MyChart is used to connect with patients for Virtual Visits (Telemedicine).  Patients are able to view lab/test results, encounter notes, upcoming appointments, etc.  Non-urgent messages can be sent to your provider as well.   To learn more about what you can do with MyChart, go to [email or web address].    Your next appointment:   3 week(s)  The format for your next appointment:   In Person  Provider:   You may see Ena Dawley, MD or one of the following Advanced Practice Providers on your designated Care Team:    Melina Copa, [place]  Ermalinda Barrios, PA-C    Other Instructions None     Signed, Ermalinda Barrios, PA-C  01/04/2020 11:52 AM    [address], [place], [place]  [address] Phone: [phone]; Fax: [phone]

## 2019-12-29 ENCOUNTER — Ambulatory Visit (INDEPENDENT_AMBULATORY_CARE_PROVIDER_SITE_OTHER): Payer: 59 | Admitting: Pharmacist

## 2019-12-29 ENCOUNTER — Other Ambulatory Visit: Payer: Self-pay

## 2019-12-29 ENCOUNTER — Other Ambulatory Visit: Payer: Self-pay | Admitting: Pharmacist

## 2019-12-29 DIAGNOSIS — E785 Hyperlipidemia, unspecified: Secondary | ICD-10-CM

## 2019-12-29 DIAGNOSIS — T466X5A Adverse effect of antihyperlipidemic and antiarteriosclerotic drugs, initial encounter: Secondary | ICD-10-CM | POA: Insufficient documentation

## 2019-12-29 DIAGNOSIS — G720 Drug-induced myopathy: Secondary | ICD-10-CM | POA: Diagnosis not present

## 2019-12-29 NOTE — Progress Notes (Signed)
Patient ID: [name]                 DOB: 1956-07-16                    MRN: [id number]     HPI: [name] is a 64 y.o. female patient of Dr. Meda Coffee referred to lipid clinic by Ermalinda Barrios. 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 Coronary CTA 11/11/17 Calcium score 0 and normal coronary arteries. LDL is significantly elevated. Patient referred to lipid clinic due to statin intolerance.  Patient presents today to lipid clinic. She hurt her rotator cuff about 3 weeks ago. As been unable to exercise due to it. Her diet is low in fat. Does not know if her family has a history of elevated cholesterol. Has Pharmacist, community. States statins make her feel bad. Does not remember which ones she has tried, but chart shows rosuvastatin and lovastatin.  BIN [id number] ID [id number] GRp RLCORP PCN IRX  Current Medications: none Intolerances: rosuvastatin 20mg  (body aches/nausea), lovastatin 20mg  (tired, nausea, muscle pains) Risk Factors: LDL >190 LDL goal: <100  Diet: no meat, not much cheese, goat milk,  Vegetables, beans  Exercise: use to walk and do floor exercises but cant currently due to rotator cuff  Family History: The patient's family history includes Heart attack in her mother; Hypertension in her father.   Social History:   Labs:11/22/2019: TC 278, TG 158, HDL 53, LDL 193  Past Medical History:  Diagnosis Date  . Cancer ([place]) 08/03/2010   colon  . 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 HLD (hyperlipidemia)   . Hypertension   . Uterine fibroid     Current Outpatient Medications on File Prior to Visit  Medication Sig Dispense Refill  . amLODipine (NORVASC) 10 MG tablet Take 1 tablet (10 mg total) by mouth daily. 90 tablet 3  . hydrochlorothiazide (HYDRODIURIL) 25 MG tablet Take 1 tablet (25  mg total) by mouth daily. 90 tablet 3  . lisinopril (ZESTRIL) 40 MG tablet TAKE 1 TABLET BY MOUTH EVERY DAY 90 tablet 3  . methocarbamol (ROBAXIN) 500 MG tablet Take 1 tablet by mouth as needed.    Marland Kitchen oxyCODONE-acetaminophen (PERCOCET/ROXICET) 5-325 MG tablet Take 1 tablet by mouth as needed.    . pantoprazole (PROTONIX) 40 MG tablet Take 1 tablet (40 mg total) by mouth daily. 30 tablet 3  . rivaroxaban (XARELTO) 20 MG TABS tablet TAKE 1 TABLET BY MOUTH DAILY WITH SUPPER 90 tablet 3  . rosuvastatin (CRESTOR) 20 MG tablet Take 1 tablet (20 mg total) by mouth daily. 90 tablet 3  . Vitamin D, Ergocalciferol, (DRISDOL) 1.25 MG (50000 UNIT) CAPS capsule TAKE 1 CAPSULE BY MOUTH EVERY 7 (SEVEN) DAYS. 4 capsule 2   No current facility-administered medications on file prior to visit.    No Known Allergies  Assessment/Plan:  1. Hyperlipidemia - Patient LDL is above goal of <100. Discussed PCSK9i with patient. Reviewed injection technique, side effects, prior authorization process and cost with patient. She is agreeable to try. Will submit prior authorization for Repatha.     Thank you,  Ramond Dial, Pharm.D, BCPS, CPP [place]  [address], [place], [place] [address]  Phone: [phone]; Fax: [phone]

## 2019-12-29 NOTE — Patient Instructions (Signed)
It was a pleasure to meet you1  I will submit a prior authorization to your insurance company. Once I hear back, I will call you  Resume exercise when able  Call us at [phone] with any questions

## 2019-12-30 ENCOUNTER — Telehealth: Payer: Self-pay | Admitting: Pharmacist

## 2019-12-30 NOTE — Telephone Encounter (Signed)
repatha prior auth denied. Called patient to let her know we have sent appeals letter. Can take up to 30 days for decision.

## 2020-01-04 ENCOUNTER — Other Ambulatory Visit: Payer: 59

## 2020-01-04 ENCOUNTER — Encounter: Payer: Self-pay | Admitting: Physician Assistant

## 2020-01-04 ENCOUNTER — Other Ambulatory Visit: Payer: Self-pay

## 2020-01-04 ENCOUNTER — Ambulatory Visit (INDEPENDENT_AMBULATORY_CARE_PROVIDER_SITE_OTHER): Payer: 59 | Admitting: Physician Assistant

## 2020-01-04 VITALS — BP 148/76 | HR 72 | Ht 67.0 in | Wt 197.4 lb

## 2020-01-04 DIAGNOSIS — E785 Hyperlipidemia, unspecified: Secondary | ICD-10-CM | POA: Diagnosis not present

## 2020-01-04 DIAGNOSIS — Z7189 Other specified counseling: Secondary | ICD-10-CM

## 2020-01-04 DIAGNOSIS — I82403 Acute embolism and thrombosis of unspecified deep veins of lower extremity, bilateral: Secondary | ICD-10-CM

## 2020-01-04 DIAGNOSIS — I10 Essential (primary) hypertension: Secondary | ICD-10-CM

## 2020-01-04 DIAGNOSIS — Z87898 Personal history of other specified conditions: Secondary | ICD-10-CM | POA: Diagnosis not present

## 2020-01-04 MED ORDER — HYDRALAZINE HCL 25 MG PO TABS: 25.0000 mg | ORAL_TABLET | Freq: Three times a day (TID) | ORAL | 3 refills | Status: DC

## 2020-01-04 NOTE — Patient Instructions (Addendum)
Medication Instructions:  Your physician has recommended you make the following change in your medication:   START hydralazine 25 mg by mouth three times a day.   Lab Work: None today If you have labs (blood work) drawn today and your tests are completely normal, you will receive your results only by: Marland Kitchen MyChart Message (if you have MyChart) OR . A paper copy in the mail If you have any lab test that is abnormal or we need to change your treatment, we will call you to review the results.   Testing/Procedures: None today   Follow-Up: At [facility], you and your health needs are our priority.  As part of our continuing mission to provide you with exceptional heart care, we have created designated Provider Care Teams.  These Care Teams include your primary Cardiologist (physician) and Advanced Practice Providers (APPs -  Physician Assistants and Nurse Practitioners) who all work together to provide you with the care you need, when you need it.  We recommend signing up for the patient portal called "MyChart".  Sign up information is provided on this After Visit Summary.  MyChart is used to connect with patients for Virtual Visits (Telemedicine).  Patients are able to view lab/test results, encounter notes, upcoming appointments, etc.  Non-urgent messages can be sent to your provider as well.   To learn more about what you can do with MyChart, go to [email or web address].    Your next appointment:   3 week(s)  The format for your next appointment:   In Person  Provider:   You may see Ena Dawley, MD or one of the following Advanced Practice Providers on your designated Care Team:    Melina Copa, PA-C  Ermalinda Barrios, PA-C    Other Instructions None

## 2020-01-07 ENCOUNTER — Inpatient Hospital Stay: Payer: 59 | Attending: Internal Medicine

## 2020-01-07 ENCOUNTER — Other Ambulatory Visit: Payer: Self-pay

## 2020-01-07 DIAGNOSIS — Z9089 Acquired absence of other organs: Secondary | ICD-10-CM

## 2020-01-07 DIAGNOSIS — D150 Benign neoplasm of thymus: Secondary | ICD-10-CM | POA: Diagnosis present

## 2020-01-07 DIAGNOSIS — Z85038 Personal history of other malignant neoplasm of large intestine: Secondary | ICD-10-CM | POA: Diagnosis not present

## 2020-01-07 DIAGNOSIS — Z86718 Personal history of other venous thrombosis and embolism: Secondary | ICD-10-CM | POA: Insufficient documentation

## 2020-01-07 DIAGNOSIS — Z79899 Other long term (current) drug therapy: Secondary | ICD-10-CM | POA: Diagnosis not present

## 2020-01-07 DIAGNOSIS — Z7901 Long term (current) use of anticoagulants: Secondary | ICD-10-CM | POA: Insufficient documentation

## 2020-01-07 LAB — CBC WITH DIFFERENTIAL (CANCER CENTER ONLY)
Abs Immature Granulocytes: 0.02 10*3/uL (ref 0.00–0.07)
Basophils Absolute: 0.1 10*3/uL (ref 0.0–0.1)
Basophils Relative: 1 %
Eosinophils Absolute: 0.1 10*3/uL (ref 0.0–0.5)
Eosinophils Relative: 2 %
HCT: 42.3 % (ref 36.0–46.0)
Hemoglobin: 13.9 g/dL (ref 12.0–15.0)
Immature Granulocytes: 0 %
Lymphocytes Relative: 44 %
Lymphs Abs: 2 10*3/uL (ref 0.7–4.0)
MCH: 31 pg (ref 26.0–34.0)
MCHC: 32.9 g/dL (ref 30.0–36.0)
MCV: 94.2 fL (ref 80.0–100.0)
Monocytes Absolute: 0.4 10*3/uL (ref 0.1–1.0)
Monocytes Relative: 8 %
Neutro Abs: 2 10*3/uL (ref 1.7–7.7)
Neutrophils Relative %: 45 %
Platelet Count: 171 10*3/uL (ref 150–400)
RBC: 4.49 MIL/uL (ref 3.87–5.11)
RDW: 12.7 % (ref 11.5–15.5)
WBC Count: 4.5 10*3/uL (ref 4.0–10.5)
nRBC: 0 % (ref 0.0–0.2)

## 2020-01-07 LAB — CMP (CANCER CENTER ONLY)
ALT: 12 U/L (ref 0–44)
AST: 15 U/L (ref 15–41)
Albumin: 4.3 g/dL (ref 3.5–5.0)
Alkaline Phosphatase: 114 U/L (ref 38–126)
Anion gap: 13 (ref 5–15)
BUN: 14 mg/dL (ref 8–23)
CO2: 21 mmol/L — ABNORMAL LOW (ref 22–32)
Calcium: 9.3 mg/dL (ref 8.9–10.3)
Chloride: 110 mmol/L (ref 98–111)
Creatinine: 0.94 mg/dL (ref 0.44–1.00)
GFR, Est AFR Am: >60 mL/min (ref >60)
GFR, Estimated: >60 mL/min (ref >60)
Glucose, Bld: 107 mg/dL — ABNORMAL HIGH (ref 70–99)
Potassium: 3.9 mmol/L (ref 3.5–5.1)
Sodium: 144 mmol/L (ref 135–145)
Total Bilirubin: 0.7 mg/dL (ref 0.3–1.2)
Total Protein: 7 g/dL (ref 6.5–8.1)

## 2020-01-11 ENCOUNTER — Inpatient Hospital Stay: Payer: 59 | Attending: Internal Medicine | Admitting: Internal Medicine

## 2020-01-11 ENCOUNTER — Other Ambulatory Visit: Payer: Self-pay

## 2020-01-11 ENCOUNTER — Encounter: Payer: Self-pay | Admitting: Internal Medicine

## 2020-01-11 ENCOUNTER — Telehealth: Payer: Self-pay

## 2020-01-11 VITALS — BP 162/80 | HR 69 | Temp 97.8°F | Resp 18 | Ht 67.0 in | Wt 195.0 lb

## 2020-01-11 DIAGNOSIS — Z9089 Acquired absence of other organs: Secondary | ICD-10-CM | POA: Diagnosis not present

## 2020-01-11 DIAGNOSIS — Z86018 Personal history of other benign neoplasm: Secondary | ICD-10-CM | POA: Diagnosis not present

## 2020-01-11 MED ORDER — REPATHA SURECLICK 140 MG/ML ~~LOC~~ SOAJ: 1.0000 "pen " | SUBCUTANEOUS | 11 refills | Status: DC

## 2020-01-11 MED ORDER — HYDRALAZINE HCL 25 MG PO TABS: 25.0000 mg | ORAL_TABLET | Freq: Three times a day (TID) | ORAL | 3 refills | Status: DC

## 2020-01-11 NOTE — Progress Notes (Signed)
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  PRIOR THERAPY: Status post thymectomy under the care of Dr. Roxan Hockey on 11/24/2017  CURRENT THERAPY: Observation.  INTERVAL HISTORY: [name] 64 y.o. female returns to the clinic today for follow-up visit.  The patient is feeling fine today with no concerning complaints.  She had a recent right shoulder surgery.  She was supposed to have repeat CT scan of the chest before this visit but unfortunately it was not scheduled as ordered.  The patient denied having any current chest pain, shortness of breath, cough or hemoptysis.  She denied having any fever or chills.  She has no nausea, vomiting, diarrhea or constipation.  She has no headache or visual changes.  She is here today for evaluation and repeat blood work.   MEDICAL HISTORY: Past Medical History:  Diagnosis Date  . Cancer ([place]) 08/03/2010   colon  . 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 History of colon cancer, stage I 08/17/2015  . HLD (hyperlipidemia)   . Hypertension   . Prediabetes 05/25/2019  . Uterine fibroid     ALLERGIES:  has No Known Allergies.  MEDICATIONS:  Current Outpatient Medications  Medication Sig Dispense Refill  . amLODipine (NORVASC) 10 MG tablet Take 1 tablet (10 mg total) by mouth daily. 90 tablet 3  . Evolocumab (REPATHA SURECLICK) 903 MG/ML SOAJ Inject 1 pen into the skin every 14 (fourteen) days. 2 pen 11  . hydrALAZINE (APRESOLINE) 25 MG tablet Take 1 tablet (25 mg total) by mouth 3 (three) times daily. 270 tablet 3  . lisinopril (ZESTRIL) 40 MG tablet TAKE 1 TABLET BY MOUTH EVERY DAY 90 tablet 3  . methocarbamol (ROBAXIN) 500 MG tablet Take 1 tablet by mouth as needed.    Marland Kitchen oxyCODONE-acetaminophen  (PERCOCET/ROXICET) 5-325 MG tablet Take 1 tablet by mouth as needed.    . pantoprazole (PROTONIX) 40 MG tablet Take 1 tablet (40 mg total) by mouth daily. 30 tablet 3  . rivaroxaban (XARELTO) 20 MG TABS tablet TAKE 1 TABLET BY MOUTH DAILY WITH SUPPER 90 tablet 3  . Vitamin D, Ergocalciferol, (DRISDOL) 1.25 MG (50000 UNIT) CAPS capsule TAKE 1 CAPSULE BY MOUTH EVERY 7 (SEVEN) DAYS. 4 capsule 2   No current facility-administered medications for this visit.    SURGICAL HISTORY:  Past Surgical History:  Procedure Laterality Date  . ABDOMINAL HYSTERECTOMY    . CHOLECYSTECTOMY    . COLON SURGERY    . GALLBLADDER SURGERY    . MEDIASTERNOTOMY N/A 11/24/2017   Procedure: PARTIAL STERNOTOMY;  Surgeon: [name], MD;  Location: [facility];  Service: Thoracic;  Laterality: N/A;  . THYMECTOMY  11/24/2017   Procedure: THYMECTOMY;  Surgeon: [name], MD;  Location: [facility];  Service: Thoracic;;    REVIEW OF SYSTEMS:  A comprehensive review of systems was negative.   PHYSICAL EXAMINATION: General appearance: alert, cooperative and no distress Head: Normocephalic, without obvious abnormality, atraumatic Neck: no adenopathy, no JVD, supple, symmetrical, trachea midline and thyroid not enlarged, symmetric, no tenderness/mass/nodules Lymph nodes: Cervical, supraclavicular, and axillary nodes normal. Resp: clear to auscultation bilaterally Back: symmetric, no curvature. ROM normal. No CVA tenderness. Cardio: regular rate and rhythm, S1, S2 normal, no murmur, click, rub or gallop  GI: soft, non-tender; bowel sounds normal; no masses,  no organomegaly Extremities: extremities normal, atraumatic, no cyanosis or edema  ECOG PERFORMANCE STATUS: 1 - Symptomatic but completely ambulatory  Blood pressure (!) 162/80, pulse 69, temperature 97.8 F (36.6 C), temperature source Temporal, resp. rate 18, height 5\' 7"  (1.702 m), weight 195 lb (88.5 kg), SpO2 100 %.  LABORATORY DATA: Lab Results    Component Value Date   WBC 4.5 01/07/2020   HGB 13.9 01/07/2020   HCT 42.3 01/07/2020   MCV 94.2 01/07/2020   PLT 171 01/07/2020      Chemistry      Component Value Date/Time   NA 144 01/07/2020 0908   NA 145 (H) 11/11/2017 1305   NA 144 09/01/2015 1421   K 3.9 01/07/2020 0908   K 3.6 09/01/2015 1421   CL 110 01/07/2020 0908   CL 106 08/26/2012 0905   CO2 21 (L) 01/07/2020 0908   CO2 28 09/01/2015 1421   BUN 14 01/07/2020 0908   BUN 11 11/11/2017 1305   BUN 22.0 09/01/2015 1421   CREATININE 0.94 01/07/2020 0908   CREATININE 1.1 09/01/2015 1421      Component Value Date/Time   CALCIUM 9.3 01/07/2020 0908   CALCIUM 9.3 09/01/2015 1421   ALKPHOS 114 01/07/2020 0908   ALKPHOS 89 09/01/2015 1421   AST 15 01/07/2020 0908   AST 19 09/01/2015 1421   ALT 12 01/07/2020 0908   ALT 22 09/01/2015 1421   BILITOT 0.7 01/07/2020 0908   BILITOT 0.55 09/01/2015 1421       RADIOGRAPHIC STUDIES: No results found.  ASSESSMENT AND PLAN: This is a very pleasant 64 years old white female with history of a stage I thymoma status post surgical resection in April 2019.  The patient has been in observation since that time with no concerning complaints. She was supposed to have repeat CT scan of the chest before this visit but unfortunately it was not scheduled as ordered. Her blood work is unremarkable today. She continues to have elevated blood pressure which is higher today because of anxiety about the visit. I recommended for the patient to continue her routine follow-up visit and evaluation by her primary care physician at this point.  I will see her on an as-needed basis in the future. She was advised to call if she has any concerning symptoms. The patient voices understanding of current disease status and treatment options and is in agreement with the current care plan.  All questions were answered. The patient knows to call the clinic with any problems, questions or concerns. We can  certainly see the patient much sooner if necessary.  Disclaimer: This note was dictated with voice recognition software. Similar sounding words can inadvertently be transcribed and may not be corrected upon review.

## 2020-01-11 NOTE — Telephone Encounter (Signed)
Pt came in today to make an apt to follow up with ML and to check on the status of her repatha. Pt said she went by her  Pharmacy ([facility] [place] [address])  and they referred her back to our office.After speaking with [name] at our office who said she approved the medication but needs to send it to the pt pharmacy I informed [name] of this and she verbalized her understanding and thanked me for my time.

## 2020-01-11 NOTE — Telephone Encounter (Signed)
Received approval letter today for [place]. Denial was overturned. Approved through 07/11/20. [facility]  Due to pt age of >65 she will need to call [phone] to get copay card.  Provided phone number to patient to call Repatha for copay card. Will repeat labs in about 3 months

## 2020-01-11 NOTE — Addendum Note (Signed)
Addended by: Marcelle Overlie D on: 01/11/2020 01:17 PM   Modules accepted: Orders

## 2020-01-31 NOTE — Progress Notes (Signed)
Cardiology Office Note    Date:  9/50/9326   ID:  [name], DOB Nov 22, 1955, MRN [id number]  PCP:  Darreld Mclean, MD  Cardiologist: Ena Dawley, MD EPS: None  Chief Complaint  Patient presents with   Follow-up    History of Present Illness:  [name] is a 64 y.o. 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 Coronary CTA 11/11/17 Calcium score 0 and normal coronary arteries.   Last saw [name] 10/27/17 amlodipine added for elevated BP.    I saw the patient 12/08/2019 2 days after rotator cuff repair and she was in quite a bit of pain.  Blood pressure was running high.  She was also having some epigastric pain that improved with Protonix.  She had stopped Crestor because of body aches and nausea.  Also stopped Xarelto for surgery and had not restarted it.  I added HCTZ 25 mg once daily and asked her to restart her Xarelto and referred her to lipid clinic.   I saw the patient 01/04/2020 and blood pressure was still running high in the afternoon.  She had stopped HCTZ because she did not think it was helping.  I started hydralazine 25 mg 3 times daily.  Patient comes in for f/u. Confused over her meds and her lists says she's still taking HCTZ and not hydralazine. BP 175/95 last night and runs high every night. No regular exercise since rotator cuff surgery but says she'll start.    Past Medical History:  Diagnosis Date   Bilateral leg pain 07/02/2016   Cancer ([place]) 08/03/2010   colon   DVT (deep venous thrombosis) (HCC)    recurrent; on long term anticoag (Rivaroxaban)   History of colon cancer, stage I 08/17/2015   HLD (hyperlipidemia)    Hypertension    Prediabetes 05/25/2019   Uterine fibroid    Varicose 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 08/26/2014    Past Surgical History:  Procedure Laterality Date    ABDOMINAL HYSTERECTOMY     CHOLECYSTECTOMY     COLON SURGERY     GALLBLADDER SURGERY     MEDIASTERNOTOMY N/A 11/24/2017   Procedure: PARTIAL STERNOTOMY;  Surgeon: [name], MD;  Location: [facility];  Service: Thoracic;  Laterality: N/A;   THYMECTOMY  11/24/2017   Procedure: THYMECTOMY;  Surgeon: [name], MD;  Location: [facility];  Service: Thoracic;;    Current Medications: Current Meds  Medication Sig   amLODipine (NORVASC) 10 MG tablet Take 1 tablet (10 mg total) by mouth daily.   Evolocumab (REPATHA SURECLICK) 833 MG/ML SOAJ Inject 1 pen into the skin every 14 (fourteen) days.   hydrALAZINE (APRESOLINE) 25 MG tablet Take 1 tablet (25 mg total) by mouth 3 (three) times daily.   lisinopril (ZESTRIL) 40 MG tablet TAKE 1 TABLET BY MOUTH EVERY DAY   methocarbamol (ROBAXIN) 500 MG tablet Take 1 tablet by mouth as needed.   oxyCODONE-acetaminophen (PERCOCET/ROXICET) 5-325 MG tablet Take 1 tablet by mouth as needed.   pantoprazole (PROTONIX) 40 MG tablet Take 1 tablet (40 mg total) by mouth daily.   rivaroxaban (XARELTO) 20 MG TABS tablet TAKE 1 TABLET BY MOUTH DAILY WITH SUPPER   Vitamin D, Ergocalciferol, (DRISDOL) 1.25 MG (50000 UNIT) CAPS capsule TAKE 1 CAPSULE BY MOUTH EVERY 7 (SEVEN) DAYS.     Allergies:   Patient has no known  allergies.   Social History   Socioeconomic History   Marital status: Married    Spouse name: Not on file   Number of children: 1   Years of education: Bachelors   Highest education level: Not on file  Occupational History   Not on file  Tobacco Use   Smoking status: Never Smoker   Smokeless tobacco: Never Used  Vaping Use   Vaping Use: Never used  Substance and Sexual Activity   Alcohol use: No    Alcohol/week: 0.0 standard drinks   Drug use: No   Sexual activity: Not on file  Other Topics Concern   Not on file  Social History Narrative   Lives at home with husband and son.   Right-handed.   No  caffeine use.   Social Determinants of Health   Financial Resource Strain:    Difficulty of Paying Living Expenses:   Food Insecurity:    Worried About Charity fundraiser in the Last Year:    Arboriculturist in the Last Year:   Transportation Needs:    Film/video editor (Medical):    Lack of Transportation (Non-Medical):   Physical Activity:    Days of Exercise per Week:    Minutes of Exercise per Session:   Stress:    Feeling of Stress :   Social Connections:    Frequency of Communication with Friends and Family:    Frequency of Social Gatherings with Friends and Family:    Attends Religious Services:    Active Member of Clubs or Organizations:    Attends Archivist Meetings:    Marital Status:      Family History:  The patient's   family history includes Heart attack in her mother; Hypertension in her father.   ROS:   Please see the history of present illness.    ROS All other systems reviewed and are negative.   PHYSICAL EXAM:   VS:  BP (!) 142/78    Pulse 76    Ht 5\' 7"  (1.702 m)    Wt 195 lb (88.5 kg)    SpO2 97%    BMI 30.54 kg/m   Physical Exam  GEN: Well nourished, well developed, in no acute distress  Neck: no JVD, carotid bruits, or masses Cardiac:RRR; no murmurs, rubs, or gallops  Respiratory:  clear to auscultation bilaterally, normal work of breathing GI: soft, nontender, nondistended, + BS Ext: without cyanosis, clubbing, or edema, Good distal pulses bilaterally Neuro:  Alert and Oriented x 3 Psych: euthymic mood, full affect  Wt Readings from Last 3 Encounters:  02/02/20 195 lb (88.5 kg)  01/11/20 195 lb (88.5 kg)  01/04/20 197 lb 6.4 oz (89.5 kg)      Studies/Labs Reviewed:   EKG:  EKG is not ordered today.   Recent Labs: 11/22/2019: TSH 2.09 01/07/2020: ALT 12; BUN 14; Creatinine 0.94; Hemoglobin 13.9; Platelet Count 171; Potassium 3.9; Sodium 144   Lipid Panel    Component Value Date/Time   CHOL 278 (H)  11/22/2019 1323   TRIG 158.0 (H) 11/22/2019 1323   HDL 53.10 11/22/2019 1323   CHOLHDL 5 11/22/2019 1323   VLDL 31.6 11/22/2019 1323   LDLCALC 193 (H) 11/22/2019 1323    Additional studies/ records that were reviewed today include:  Coronary CTA 11/2017 IMPRESSION: 1. Coronary calcium score of 0. This was 0 percentile for age and sex matched control.   2. Normal coronary origin with right dominance.   3. The  study is significantly affected by motion, however there is no significant plaque in any of the coronary arteries.   Electronically Signed: By: Ena Dawley On: 11/11/2017 15:25         ASSESSMENT:    1. Essential hypertension   2. History of chest pain   3. History of recurrent deep vein thrombosis (DVT)   4. Mixed hyperlipidemia      PLAN:  In order of problems listed above:  Essential hypertension BP still running high mostly in the afternoon.  I added hydralazine 25 mg tid but she didn't take it-was confused over meds. Was still taking HCTZ that was supposed to be stopped in May. Will stop today and start hydralazine. F/u in a couple of weeks   History of chest pain with coronary CTA 11/2017 calcium score 0 normal coronaries-no recent chest pain   History of recurrent DVT on Xarelto followed by Dr. Earlie Server    HLD LDL 224 and cholesterol 278 stopped Crestor because of body aches and referred to the lipid clinic. Now on repatha            Medication Adjustments/Labs and Tests Ordered: Current medicines are reviewed at length with the patient today.  Concerns regarding medicines are outlined above.  Medication changes, Labs and Tests ordered today are listed in the Patient Instructions below. Patient Instructions  Medication Instructions:  Your physician has recommended you make the following change in your medication:   1. STOP: hydrochlorothiazide as previously instructed  2. START: hydralazine 25 mg tablet three times a day as previously  instructed  *If you need a refill on your cardiac medications before your next appointment, please call your pharmacy*   Lab Work: None  If you have labs (blood work) drawn today and your tests are completely normal, you will receive your results only by:  [place] (if you have MyChart) OR  A paper copy in the mail If you have any lab test that is abnormal or we need to change your treatment, we will call you to review the results.   Testing/Procedures: None   Follow-Up: Follow up with Ermalinda Barrios, PA on 02/23/20 at 1:45 PM   Other Instructions None     Signed, Ermalinda Barrios, PA-C  02/02/2020 12:42 PM    [facility] [address], [place], [place]  [address] Phone: [phone]; Fax: [phone]

## 2020-02-02 ENCOUNTER — Other Ambulatory Visit: Payer: Self-pay

## 2020-02-02 ENCOUNTER — Ambulatory Visit (INDEPENDENT_AMBULATORY_CARE_PROVIDER_SITE_OTHER): Payer: 59 | Admitting: Physician Assistant

## 2020-02-02 ENCOUNTER — Encounter: Payer: Self-pay | Admitting: Physician Assistant

## 2020-02-02 VITALS — BP 142/78 | HR 76 | Ht 67.0 in | Wt 195.0 lb

## 2020-02-02 DIAGNOSIS — Z87898 Personal history of other specified conditions: Secondary | ICD-10-CM

## 2020-02-02 DIAGNOSIS — Z86718 Personal history of other venous thrombosis and embolism: Secondary | ICD-10-CM | POA: Diagnosis not present

## 2020-02-02 DIAGNOSIS — I10 Essential (primary) hypertension: Secondary | ICD-10-CM

## 2020-02-02 DIAGNOSIS — E782 Mixed hyperlipidemia: Secondary | ICD-10-CM | POA: Diagnosis not present

## 2020-02-02 NOTE — Patient Instructions (Addendum)
Medication Instructions:  Your physician has recommended you make the following change in your medication:   1. STOP: hydrochlorothiazide as previously instructed  2. START: hydralazine 25 mg tablet three times a day as previously instructed  *If you need a refill on your cardiac medications before your next appointment, please call your pharmacy*   Lab Work: None  If you have labs (blood work) drawn today and your tests are completely normal, you will receive your results only by: Marland Kitchen MyChart Message (if you have MyChart) OR . A paper copy in the mail If you have any lab test that is abnormal or we need to change your treatment, we will call you to review the results.   Testing/Procedures: None   Follow-Up: Follow up with Kendra Barrios, PA on 02/23/20 at 1:45 PM   Other Instructions None

## 2020-02-09 ENCOUNTER — Telehealth: Payer: Self-pay | Admitting: Internal Medicine

## 2020-02-09 NOTE — Telephone Encounter (Signed)
Called pt per 6/30 sch msg - unable to reach pt . Left message with appt date and time

## 2020-02-11 ENCOUNTER — Other Ambulatory Visit: Payer: Self-pay | Admitting: Medical Oncology

## 2020-02-11 DIAGNOSIS — Z9089 Acquired absence of other organs: Secondary | ICD-10-CM

## 2020-02-15 ENCOUNTER — Other Ambulatory Visit: Payer: Self-pay

## 2020-02-15 ENCOUNTER — Ambulatory Visit (HOSPITAL_COMMUNITY): Payer: 59

## 2020-02-15 ENCOUNTER — Encounter (HOSPITAL_COMMUNITY): Payer: Self-pay

## 2020-02-15 ENCOUNTER — Ambulatory Visit (HOSPITAL_COMMUNITY)
Admission: RE | Admit: 2020-02-15 | Discharge: 2020-02-15 | Disposition: A | Discharge status: Home / Self Care | Payer: 59 | Source: Ambulatory Visit | Attending: Internal Medicine | Admitting: Internal Medicine

## 2020-02-15 ENCOUNTER — Inpatient Hospital Stay: Payer: 59 | Attending: Internal Medicine

## 2020-02-15 DIAGNOSIS — Z86018 Personal history of other benign neoplasm: Secondary | ICD-10-CM | POA: Diagnosis present

## 2020-02-15 DIAGNOSIS — Z9089 Acquired absence of other organs: Secondary | ICD-10-CM | POA: Diagnosis present

## 2020-02-15 LAB — CREATININE (CANCER CENTER ONLY)
Creatinine: 0.97 mg/dL (ref 0.44–1.00)
GFR, Est AFR Am: >60 mL/min (ref >60)
GFR, Estimated: >60 mL/min (ref >60)

## 2020-02-15 MED ORDER — IOHEXOL 300 MG/ML  SOLN
75.0000 mL | Freq: Once | INTRAMUSCULAR | Status: AC | PRN
  Administered 2020-02-15: 75 mL via INTRAVENOUS

## 2020-02-15 MED ORDER — SODIUM CHLORIDE (PF) 0.9 % IJ SOLN
INTRAMUSCULAR | Status: AC
  Filled 2020-02-15: qty 50

## 2020-02-22 NOTE — Progress Notes (Signed)
Cardiology Office Note    Date:  4/58/0998   ID:  [name], DOB 1956/03/13, MRN [id number]  PCP:  Kendra Mclean, MD  Cardiologist: Kendra Dawley, MD EPS: None  Chief Complaint  Patient presents with  . Follow-up    History of Present Illness:  [name] is a 64 y.o. 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 Coronary CTA 11/11/17 Calcium score 0 and normal coronary arteries.   Last saw [name] 10/27/17 amlodipine added for elevated BP.    I saw the patient 12/08/2019 2 days after rotator cuff repair and she was in quite a bit of pain.  Blood pressure was running high.  She was also having some epigastric pain that improved with Protonix.  She had stopped Crestor because of body aches and nausea.  Also stopped Xarelto for surgery and had not restarted it.  I added HCTZ 25 mg once daily and asked her to restart her Xarelto and referred her to lipid clinic.   I have been seeing the patient for elevated blood pressures but she has had confusion over her meds and was not taking hydralazine as prescribed at office visit 02/02/2020.  Patient comes in for f/u. BP ok in am at home but in evening BP 155. Complains of leg pain worse with standing on her job all day in one spot. Ran out of repatha.  Past Medical History:  Diagnosis Date  . Bilateral leg pain 07/02/2016  . Cancer ([place]) 08/03/2010   colon  . 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 History of colon cancer, stage I 08/17/2015  . HLD (hyperlipidemia)   . Hypertension   . Prediabetes 05/25/2019  . Uterine fibroid   . Varicose 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 08/26/2014    Past Surgical History:  Procedure Laterality Date  . ABDOMINAL HYSTERECTOMY    . CHOLECYSTECTOMY    . COLON SURGERY    . GALLBLADDER SURGERY    .  MEDIASTERNOTOMY N/A 11/24/2017   Procedure: PARTIAL STERNOTOMY;  Surgeon: [name], MD;  Location: [facility];  Service: Thoracic;  Laterality: N/A;  . THYMECTOMY  11/24/2017   Procedure: THYMECTOMY;  Surgeon: [name], MD;  Location: [facility];  Service: Thoracic;;    Current Medications: No outpatient medications have been marked as taking for the 02/23/20 encounter (Office Visit) with [name], PA-C.     Allergies:   Patient has no known allergies.   Social History   Socioeconomic History  . Marital status: Married    Spouse name: Not on file  . Number of children: 1  . Years of education: Bachelors  . Highest education level: Not on file  Occupational History  . Not on file  Tobacco Use  . Smoking status: Never Smoker  . Smokeless tobacco: Never Used  Vaping Use  . Vaping Use: Never used  Substance and Sexual Activity  . Alcohol use: No    Alcohol/week: 0.0 standard drinks  . Drug use: No  . Sexual activity: Not on file  Other Topics Concern  . Not on file  Social History Narrative   Lives at home with husband and son.   Right-handed.   No caffeine use.   Social Determinants of Health   Financial Resource Strain:   . Difficulty of  Paying Living Expenses:   Food Insecurity:   . Worried About Charity fundraiser in the Last Year:   . Arboriculturist in the Last Year:   Transportation Needs:   . Film/video editor (Medical):   Marland Kitchen Lack of Transportation (Non-Medical):   Physical Activity:   . Days of Exercise per Week:   . Minutes of Exercise per Session:   Stress:   . Feeling of Stress :   Social Connections:   . Frequency of Communication with Friends and Family:   . Frequency of Social Gatherings with Friends and Family:   . Attends Religious Services:   . Active Member of Clubs or Organizations:   . Attends Archivist Meetings:   Marland Kitchen Marital Status:      Family History:  The patient's family history includes Heart  attack in her mother; Hypertension in her father.   ROS:   Please see the history of present illness.    ROS All other systems reviewed and are negative.   PHYSICAL EXAM:   VS:  BP (!) 142/82   Pulse 63   Ht 5\' 7"  (1.702 m)   Wt 193 lb (87.5 kg)   SpO2 98%   BMI 30.23 kg/m   Physical Exam  GEN: Well nourished, well developed, in no acute distress  Neck: no JVD, carotid bruits, or masses Cardiac:RRR; no murmurs, rubs, or gallops  Respiratory:  clear to auscultation bilaterally, normal work of breathing GI: soft, nontender, nondistended, + BS Ext: without cyanosis, clubbing, or edema, Good distal pulses bilaterally Neuro:  Alert and Oriented x 3 Psych: euthymic mood, full affect  Wt Readings from Last 3 Encounters:  02/23/20 193 lb (87.5 kg)  02/02/20 195 lb (88.5 kg)  01/11/20 195 lb (88.5 kg)      Studies/Labs Reviewed:   EKG:  EKG is not ordered today.   Recent Labs: 11/22/2019: TSH 2.09 01/07/2020: ALT 12; BUN 14; Hemoglobin 13.9; Platelet Count 171; Potassium 3.9; Sodium 144 02/15/2020: Creatinine 0.97   Lipid Panel    Component Value Date/Time   CHOL 278 (H) 11/22/2019 1323   TRIG 158.0 (H) 11/22/2019 1323   HDL 53.10 11/22/2019 1323   CHOLHDL 5 11/22/2019 1323   VLDL 31.6 11/22/2019 1323   LDLCALC 193 (H) 11/22/2019 1323    Additional studies/ records that were reviewed today include:  Coronary CTA 11/2017 IMPRESSION: 1. Coronary calcium score of 0. This was 0 percentile for age and sex matched control.   2. Normal coronary origin with right dominance.   3. The study is significantly affected by motion, however there is no significant plaque in any of the coronary arteries.   Electronically Signed: By: [name] On: 11/11/2017 15:25             ASSESSMENT:    1. Essential hypertension   2. Chest pain, unspecified type   3. History of recurrent deep vein thrombosis (DVT)   4. Leg pain, bilateral   5. Hyperlipidemia, unspecified  hyperlipidemia type      PLAN:  In order of problems listed above:  Essential hypertension hydralazine started last office visit. BP elevated in the evening. May be related to standing on her feet all day at work and leg pain. Overall BP is coming down. Increase hydralazine 50 mg tid. F/u in 2 months  History of chest pain with coronary CTA 11/2017 calcium score 0 and normal coronaries  History of recurrent DVT on Xarelto followed by  Dr. Mohammed-chronic leg pain worse with standing in same position at work all day. Will check venous dopplers. She's hoping to retire this year.  HLD on Repatha and followed by the lipid clinic-have contacted pharmacy about her repatha.    Medication Adjustments/Labs and Tests Ordered: Current medicines are reviewed at length with the patient today.  Concerns regarding medicines are outlined above.  Medication changes, Labs and Tests ordered today are listed in the Patient Instructions below. Patient Instructions  Medication Instructions:  Your physician has recommended you make the following change in your medication:  START taking Hydralazine 50mg  three times daily  If you need a refill on your cardiac medications before your next appointment, please call your pharmacy*   Lab Work: None If you have labs (blood work) drawn today and your tests are completely normal, you will receive your results only by: Marland Kitchen MyChart Message (if you have MyChart) OR . A paper copy in the mail If you have any lab test that is abnormal or we need to change your treatment, we will call you to review the results.   Testing/Procedures: Your physician has requested that you have a lower or upper extremity venous duplex. This test is an ultrasound of the veins in the legs or arms. It looks at venous blood flow that carries blood from the heart to the legs or arms. Allow one hour for a Lower Venous exam. Allow thirty minutes for an Upper Venous exam. There are no restrictions  or special instructions.      Follow-Up: At [facility], you and your health needs are our priority.  As part of our continuing mission to provide you with exceptional heart care, we have created designated Provider Care Teams.  These Care Teams include your primary Cardiologist (physician) and Advanced Practice Providers (APPs -  Physician Assistants and Nurse Practitioners) who all work together to provide you with the care you need, when you need it.  We recommend signing up for the patient portal called "MyChart".  Sign up information is provided on this After Visit Summary.  MyChart is used to connect with patients for Virtual Visits (Telemedicine).  Patients are able to view lab/test results, encounter notes, upcoming appointments, etc.  Non-urgent messages can be sent to your provider as well.   To learn more about what you can do with MyChart, go to [email or web address].    Your next appointment:   05/17/2020 @ 12:45  Provider:   Ermalinda Barrios, PA-C   Other Instructions Your provider suggest that you wear compression stockings     Signed, Ermalinda Barrios, PA-C  02/23/2020 2:19 PM    [facility] [address], [place], [place]  [address] Phone: [phone]; Fax: [phone]

## 2020-02-23 ENCOUNTER — Ambulatory Visit (INDEPENDENT_AMBULATORY_CARE_PROVIDER_SITE_OTHER): Payer: 59 | Admitting: Physician Assistant

## 2020-02-23 ENCOUNTER — Other Ambulatory Visit: Payer: Self-pay

## 2020-02-23 ENCOUNTER — Encounter: Payer: Self-pay | Admitting: Physician Assistant

## 2020-02-23 VITALS — BP 142/82 | HR 63 | Ht 67.0 in | Wt 193.0 lb

## 2020-02-23 DIAGNOSIS — M79605 Pain in left leg: Secondary | ICD-10-CM

## 2020-02-23 DIAGNOSIS — E785 Hyperlipidemia, unspecified: Secondary | ICD-10-CM

## 2020-02-23 DIAGNOSIS — R079 Chest pain, unspecified: Secondary | ICD-10-CM

## 2020-02-23 DIAGNOSIS — Z86718 Personal history of other venous thrombosis and embolism: Secondary | ICD-10-CM

## 2020-02-23 DIAGNOSIS — I10 Essential (primary) hypertension: Secondary | ICD-10-CM | POA: Diagnosis not present

## 2020-02-23 DIAGNOSIS — M79604 Pain in right leg: Secondary | ICD-10-CM | POA: Diagnosis not present

## 2020-02-23 MED ORDER — HYDRALAZINE HCL 50 MG PO TABS: 50.0000 mg | ORAL_TABLET | Freq: Three times a day (TID) | ORAL | 3 refills | Status: DC

## 2020-02-23 NOTE — Patient Instructions (Signed)
Medication Instructions:  Your physician has recommended you make the following change in your medication:  START taking Hydralazine 50mg  three times daily  If you need a refill on your cardiac medications before your next appointment, please call your pharmacy*   Lab Work: None If you have labs (blood work) drawn today and your tests are completely normal, you will receive your results only by: Marland Kitchen MyChart Message (if you have MyChart) OR . A paper copy in the mail If you have any lab test that is abnormal or we need to change your treatment, we will call you to review the results.   Testing/Procedures: Your physician has requested that you have a lower or upper extremity venous duplex. This test is an ultrasound of the veins in the legs or arms. It looks at venous blood flow that carries blood from the heart to the legs or arms. Allow one hour for a Lower Venous exam. Allow thirty minutes for an Upper Venous exam. There are no restrictions or special instructions.      Follow-Up: At [facility], you and your health needs are our priority.  As part of our continuing mission to provide you with exceptional heart care, we have created designated Provider Care Teams.  These Care Teams include your primary Cardiologist (physician) and Advanced Practice Providers (APPs -  Physician Assistants and Nurse Practitioners) who all work together to provide you with the care you need, when you need it.  We recommend signing up for the patient portal called "MyChart".  Sign up information is provided on this After Visit Summary.  MyChart is used to connect with patients for Virtual Visits (Telemedicine).  Patients are able to view lab/test results, encounter notes, upcoming appointments, etc.  Non-urgent messages can be sent to your provider as well.   To learn more about what you can do with MyChart, go to [email or web address].    Your next appointment:   05/17/2020 @ 12:45  Provider:    Ermalinda Barrios, PA-C   Other Instructions Your provider suggest that you wear compression stockings

## 2020-03-04 ENCOUNTER — Emergency Department (HOSPITAL_COMMUNITY)
Admission: EM | Admit: 2020-03-04 | Discharge: 2020-03-04 | Discharge status: Against Medical Advice | Payer: 59 | Attending: Emergency Medicine | Admitting: Emergency Medicine

## 2020-03-04 ENCOUNTER — Encounter (HOSPITAL_COMMUNITY): Payer: Self-pay

## 2020-03-04 ENCOUNTER — Other Ambulatory Visit: Payer: Self-pay

## 2020-03-04 DIAGNOSIS — I10 Essential (primary) hypertension: Secondary | ICD-10-CM | POA: Insufficient documentation

## 2020-03-04 DIAGNOSIS — Z79899 Other long term (current) drug therapy: Secondary | ICD-10-CM | POA: Insufficient documentation

## 2020-03-04 DIAGNOSIS — Z85038 Personal history of other malignant neoplasm of large intestine: Secondary | ICD-10-CM | POA: Diagnosis not present

## 2020-03-04 DIAGNOSIS — R7303 Prediabetes: Secondary | ICD-10-CM | POA: Diagnosis not present

## 2020-03-04 DIAGNOSIS — U071 COVID-19: Secondary | ICD-10-CM | POA: Diagnosis not present

## 2020-03-04 MED ORDER — IBUPROFEN 600 MG PO TABS: 600.0000 mg | ORAL_TABLET | Freq: Four times a day (QID) | ORAL | 0 refills | Status: DC | PRN

## 2020-03-04 MED ORDER — IBUPROFEN 400 MG PO TABS: 400.0000 mg | ORAL_TABLET | Freq: Four times a day (QID) | ORAL | 0 refills | Status: AC | PRN

## 2020-03-04 MED ORDER — METHOCARBAMOL 500 MG PO TABS: 500.0000 mg | ORAL_TABLET | Freq: Two times a day (BID) | ORAL | 0 refills | Status: DC

## 2020-03-04 MED ORDER — IBUPROFEN 200 MG PO TABS: 400.0000 mg | ORAL_TABLET | Freq: Once | ORAL | Status: DC

## 2020-03-04 MED ORDER — ONDANSETRON 4 MG PO TBDP: 4.0000 mg | ORAL_TABLET | Freq: Three times a day (TID) | ORAL | 0 refills | Status: DC | PRN

## 2020-03-04 MED ORDER — IBUPROFEN 400 MG PO TABS: 400.0000 mg | ORAL_TABLET | Freq: Four times a day (QID) | ORAL | 0 refills | Status: DC | PRN

## 2020-03-04 MED ORDER — IBUPROFEN 200 MG PO TABS: 600.0000 mg | ORAL_TABLET | Freq: Once | ORAL | Status: DC

## 2020-03-04 NOTE — ED Notes (Signed)
She was seen and d/c'd. By our Holland Falling. She ambulates without difficulty and her respirations continue to be normal.

## 2020-03-04 NOTE — ED Provider Notes (Signed)
[facility] DEPT Provider Note   CSN: [id number] Arrival date & time: 03/04/20  1053     History Chief Complaint  Patient presents with  . URI    [name] is a 64 y.o. female.  HPI Patient is a 64 year old female with a past medical history detailed below presented today for subjective fevers, headache and fatigue since Monday.  She was tested for Covid on Tuesday and tested positive.  She denies any chest pain or shortness of breath, denies any unilateral leg swelling.  She states that she has been taking her rivaroxaban she has taken for years and has had no missed doses.  She denies any lightheadedness or dizziness.  States that she has been drinking plenty of water.  She has decreased taste and smell.  She states she otherwise feels well.  She has been taking Tylenol 1000 mg every 6 hours.  She has taken no other medications for symptoms.  Denies any cough, dyspnea or hemoptysis. She has no history of GI bleeds.  No history of heart disease.  She states that Tylenol helps but seems to wear off in 2-3 hours.     Past Medical History:  Diagnosis Date  . Bilateral leg pain 07/02/2016  . Cancer ([place]) 08/03/2010   colon  . 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 History of colon cancer, stage I 08/17/2015  . HLD (hyperlipidemia)   . Hypertension   . Prediabetes 05/25/2019  . Uterine fibroid   . Varicose 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 08/26/2014    Patient Active Problem List   Diagnosis Date Noted  . 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 Prediabetes 05/25/2019  . 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 Bilateral leg pain 07/02/2016  . History of colon cancer, stage I 08/17/2015  . Varicose 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 08/26/2014  . HTN (hypertension) 12/24/2013  . Cancer of left colon ([place]) 02/25/2012  . DVT (deep venous thrombosis) ([place]) 08/27/2011    Past Surgical History:    Procedure Laterality Date  . ABDOMINAL HYSTERECTOMY    . CHOLECYSTECTOMY    . COLON SURGERY    . GALLBLADDER SURGERY    . MEDIASTERNOTOMY N/A 11/24/2017   Procedure: PARTIAL STERNOTOMY;  Surgeon: [name], MD;  Location: [facility];  Service: Thoracic;  Laterality: N/A;  . THYMECTOMY  11/24/2017   Procedure: THYMECTOMY;  Surgeon: [name], MD;  Location: [facility] OR;  Service: Thoracic;;     OB History   No obstetric history on file.     Family History  Problem Relation Age of Onset  . Heart attack Mother   . Hypertension Father     Social History   Tobacco Use  . Smoking status: Never Smoker  . Smokeless tobacco: Never Used  Vaping Use  . Vaping Use: Never used  Substance Use Topics  . Alcohol use: No    Alcohol/week: 0.0 standard drinks  . Drug use: No    Home Medications Prior to Admission medications   Medication Sig Start Date End Date Taking? Authorizing Provider  amLODipine (NORVASC) 10 MG tablet Take 1 tablet (10 mg total) by mouth daily. 05/26/19 05/25/20  Copland, Gay Filler, MD  Evolocumab (REPATHA SURECLICK) 409 MG/ML SOAJ Inject 1 pen into the skin every 14 (fourteen) days. 01/11/20   Dorothy Spark, MD  hydrALAZINE (APRESOLINE) 50 MG tablet Take 1 tablet (50 mg total) by mouth 3 (  three) times daily. 02/23/20 05/23/20  [name], PA-C  lisinopril (ZESTRIL) 40 MG tablet TAKE 1 TABLET BY MOUTH EVERY DAY 05/27/19   Copland, Gay Filler, MD  methocarbamol (ROBAXIN) 500 MG tablet Take 1 tablet by mouth as needed. 08/05/19   [provider]  oxyCODONE-acetaminophen (PERCOCET/ROXICET) 5-325 MG tablet Take 1 tablet by mouth as needed.    [provider]  rivaroxaban (XARELTO) 20 MG TABS tablet TAKE 1 TABLET BY MOUTH DAILY WITH SUPPER 05/26/19   Copland, Gay Filler, MD    Allergies    Patient has no known allergies.  Review of Systems   Review of Systems  Constitutional: Positive for chills, fatigue and fever.  HENT:  Negative for congestion.   Respiratory: Negative for shortness of breath.   Cardiovascular: Negative for chest pain.  Gastrointestinal: Negative for abdominal distention.  Neurological: Negative for dizziness and headaches.    Physical Exam Updated Vital Signs BP (!) 133/83 (BP Location: Right Arm)   Pulse 83   Temp 99.3 F (37.4 C) (Oral)   Resp 16   SpO2 95%   Physical Exam Vitals and nursing note reviewed.  Constitutional:      General: She is not in acute distress. HENT:     Head: Normocephalic and atraumatic.     Nose: Nose normal.     Mouth/Throat:     Mouth: Mucous membranes are moist.  Eyes:     General: No scleral icterus. Cardiovascular:     Rate and Rhythm: Normal rate and regular rhythm.     Pulses: Normal pulses.     Heart sounds: Normal heart sounds.  Pulmonary:     Effort: Pulmonary effort is normal. No respiratory distress.     Breath sounds: Normal breath sounds. No wheezing.  Abdominal:     Palpations: Abdomen is soft.     Tenderness: There is no abdominal tenderness. There is no guarding or rebound.  Musculoskeletal:     Cervical back: Normal range of motion.     Right lower leg: No edema.     Left lower leg: No edema.  Skin:    General: Skin is warm and dry.     Capillary Refill: Capillary refill takes less than 2 seconds.  Neurological:     Mental Status: She is alert. Mental status is at baseline.  Psychiatric:        Mood and Affect: Mood normal.        Behavior: Behavior normal.      ED Results / Procedures / Treatments   Labs (all labs ordered are listed, but only abnormal results are displayed) Labs Reviewed - No data to display  EKG None  Radiology No results found.  Procedures Procedures (including critical care time)  Medications Ordered in ED Medications - No data to display  ED Course  I have reviewed the triage vital signs and the nursing notes.  Pertinent labs & imaging results that were available during my care  of the patient were reviewed by me and considered in my medical decision making (see chart for details).    MDM Rules/Calculators/A&P                          Patient is known Covid positive patient.  Presented with fevers headache and fatigue malaise.  She denies any shortness of breath dyspnea or cough.  She states she is otherwise feeling well.  She has been taking Tylenol 1000 mg every  6 hours states that she still feels unwell and states fevers although she states she has not measured her temperature at home.  Exam is unremarkable.  No dyspnea SPO2 is within normal limits with ambulation.  No unusual lung sounds.  Patient is well-appearing and will be discharged.  Conservative therapy at this time.  Discussed with on-call pharmacy at [facility].  Although ibuprofen is strictly contraindicated in this patient given lack of anemia or thrombocytopenia and normal creatinine function short 7-day course of low-dose 400 mg ibuprofen 3 times daily is not unreasonable.  Extensively counseled patient on only taking ibuprofen if she has a true fever even while taking Tylenol.  She was given Robaxin for aches fatigue headaches and discomfort.  She will only use ibuprofen as needed and will watch out for dark tarry stools or any signs of GI bleed.  [name] was evaluated in Emergency Department on 03/04/2020 for the symptoms described in the history of present illness. She was evaluated in the context of the global COVID-19 pandemic, which necessitated consideration that the patient might be at risk for infection with the SARS-CoV-2 virus that causes COVID-19. Institutional protocols and algorithms that pertain to the evaluation of patients at risk for COVID-19 are in a state of rapid change based on information released by regulatory bodies including the CDC and federal and state organizations. These policies and algorithms were followed during the patient's care in the ED.  Final Clinical  Impression(s) / ED Diagnoses Final diagnoses:  None    Rx / DC Orders ED Discharge Orders    None       Tedd Sias, [place] 03/04/20 1336    Hayden Rasmussen, MD 03/05/20 1008

## 2020-03-04 NOTE — Discharge Instructions (Addendum)
Your COVID test is pending; you should expect results in 2-3 days. You can access your results on your MyChart--if you test positive you should receive a phone call.  In the meantime follow CDC guidelines and quarantine, wear a mask, wash hands often.   Please use Tylenol or ibuprofen for pain.  You may use 400 mg ibuprofen every 6 hours or 1000 mg of Tylenol every 6 hours.  You may choose to alternate between the 2.  This would be most effective.  Not to exceed 4 g of Tylenol within 24 hours.  Not to exceed 3200 mg ibuprofen 24 hours.  Please only take ibuprofen for the next 7 days.  Please discontinue see any dark or tarry stools.  Please drink plenty of water and eat food when you take the ibuprofen. This medication may put you at higher risk for bleeding when taken with a blood thinner such as Rivaroxaban (xarelto)   Please take over the counter vitamin D 2000-4000 units per day. I also recommend zinc 50 mg per day for the next two weeks.   Please return to ED if you feel have difficulty breathing or have emergent, new or concerning symptoms.  Patients who have symptoms consistent with COVID-19 should self isolated for: At least 3 days (72 hours) have passed since recovery, defined as resolution of fever without the use of fever reducing medications and improvement in respiratory symptoms (e.g., cough, shortness of breath), and At least 7 days have passed since symptoms first appeared.       Person Under Monitoring Name: [name]  Location: [address] [facility] [address]   Infection Prevention Recommendations for Individuals Confirmed to have, or Being Evaluated for, 2019 Novel Coronavirus (COVID-19) Infection Who Receive Care at Home  Individuals who are confirmed to have, or are being evaluated for, COVID-19 should follow the prevention steps below until a healthcare provider or local or state health department says they can return to normal  activities.  Stay home except to get medical care You should restrict activities outside your home, except for getting medical care. Do not go to work, school, or public areas, and do not use public transportation or taxis.  Call ahead before visiting your doctor Before your medical appointment, call the healthcare provider and tell them that you have, or are being evaluated for, COVID-19 infection. This will help the healthcare provider's office take steps to keep other people from getting infected. Ask your healthcare provider to call the local or state health department.  Monitor your symptoms Seek prompt medical attention if your illness is worsening (e.g., difficulty breathing). Before going to your medical appointment, call the healthcare provider and tell them that you have, or are being evaluated for, COVID-19 infection. Ask your healthcare provider to call the local or state health department.  Wear a facemask You should wear a facemask that covers your nose and mouth when you are in the same room with other people and when you visit a healthcare provider. People who live with or visit you should also wear a facemask while they are in the same room with you.  Separate yourself from other people in your home As much as possible, you should stay in a different room from other people in your home. Also, you should use a separate bathroom, if available.  Avoid sharing household items You should not share dishes, drinking glasses, cups, eating utensils, towels, bedding, or other items with other people in your home. After using  these items, you should wash them thoroughly with soap and water.  Cover your coughs and sneezes Cover your mouth and nose with a tissue when you cough or sneeze, or you can cough or sneeze into your sleeve. Throw used tissues in a lined trash can, and immediately wash your hands with soap and water for at least 20 seconds or use an alcohol-based hand  rub.  Wash your [facility] your hands often and thoroughly with soap and water for at least 20 seconds. You can use an alcohol-based hand sanitizer if soap and water are not available and if your hands are not visibly dirty. Avoid touching your eyes, nose, and mouth with unwashed hands.   Prevention Steps for Caregivers and Household Members of Individuals Confirmed to have, or Being Evaluated for, COVID-19 Infection Being Cared for in the Home  If you live with, or provide care at home for, a person confirmed to have, or being evaluated for, COVID-19 infection please follow these guidelines to prevent infection:  Follow healthcare provider's instructions Make sure that you understand and can help the patient follow any healthcare provider instructions for all care.  Provide for the patient's basic needs You should help the patient with basic needs in the home and provide support for getting groceries, prescriptions, and other personal needs.  Monitor the patient's symptoms If they are getting sicker, call his or her medical provider and tell them that the patient has, or is being evaluated for, COVID-19 infection. This will help the healthcare provider's office take steps to keep other people from getting infected. Ask the healthcare provider to call the local or state health department.  Limit the number of people who have contact with the patient If possible, have only one caregiver for the patient. Other household members should stay in another home or place of residence. If this is not possible, they should stay in another room, or be separated from the patient as much as possible. Use a separate bathroom, if available. Restrict visitors who do not have an essential need to be in the home.  Keep older adults, very young children, and other sick people away from the patient Keep older adults, very young children, and those who have compromised immune systems or chronic health  conditions away from the patient. This includes people with chronic heart, lung, or kidney conditions, diabetes, and cancer.  Ensure good ventilation Make sure that shared spaces in the home have good air flow, such as from an air conditioner or an opened window, weather permitting.  Wash your hands often Wash your hands often and thoroughly with soap and water for at least 20 seconds. You can use an alcohol based hand sanitizer if soap and water are not available and if your hands are not visibly dirty. Avoid touching your eyes, nose, and mouth with unwashed hands. Use disposable paper towels to dry your hands. If not available, use dedicated cloth towels and replace them when they become wet.  Wear a facemask and gloves Wear a disposable facemask at all times in the room and gloves when you touch or have contact with the patient's blood, body fluids, and/or secretions or excretions, such as sweat, saliva, sputum, nasal mucus, vomit, urine, or feces.  Ensure the mask fits over your nose and mouth tightly, and do not touch it during use. Throw out disposable facemasks and gloves after using them. Do not reuse. Wash your hands immediately after removing your facemask and gloves. If your personal clothing  becomes contaminated, carefully remove clothing and launder. Wash your hands after handling contaminated clothing. Place all used disposable facemasks, gloves, and other waste in a lined container before disposing them with other household waste. Remove gloves and wash your hands immediately after handling these items.  Do not share dishes, glasses, or other household items with the patient Avoid sharing household items. You should not share dishes, drinking glasses, cups, eating utensils, towels, bedding, or other items with a patient who is confirmed to have, or being evaluated for, COVID-19 infection. After the person uses these items, you should wash them thoroughly with soap and  water.  Wash laundry thoroughly Immediately remove and wash clothes or bedding that have blood, body fluids, and/or secretions or excretions, such as sweat, saliva, sputum, nasal mucus, vomit, urine, or feces, on them. Wear gloves when handling laundry from the patient. Read and follow directions on labels of laundry or clothing items and detergent. In general, wash and dry with the warmest temperatures recommended on the label.  Clean all areas the individual has used often Clean all touchable surfaces, such as counters, tabletops, doorknobs, bathroom fixtures, toilets, phones, keyboards, tablets, and bedside tables, every day. Also, clean any surfaces that may have blood, body fluids, and/or secretions or excretions on them. Wear gloves when cleaning surfaces the patient has come in contact with. Use a diluted bleach solution (e.g., dilute bleach with 1 part bleach and 10 parts water) or a household disinfectant with a label that says EPA-registered for coronaviruses. To make a bleach solution at home, add 1 tablespoon of bleach to 1 quart (4 cups) of water. For a larger supply, add  cup of bleach to 1 gallon (16 cups) of water. Read labels of cleaning products and follow recommendations provided on product labels. Labels contain instructions for safe and effective use of the cleaning product including precautions you should take when applying the product, such as wearing gloves or eye protection and making sure you have good ventilation during use of the product. Remove gloves and wash hands immediately after cleaning.  Monitor yourself for signs and symptoms of illness Caregivers and household members are considered close contacts, should monitor their health, and will be asked to limit movement outside of the home to the extent possible. Follow the monitoring steps for close contacts listed on the symptom monitoring form.   ? If you have additional questions, contact your local health  department or call the epidemiologist on call at [phone] (available 24/7). ? This guidance is subject to change. For the most up-to-date guidance from [facility], please refer to their website: YouBlogs.pl

## 2020-03-04 NOTE — ED Triage Notes (Signed)
She c/o uri sx x 1 week. She tells [place] she is covid +. She is here today "because I need something for the fevers". She is in on distress. Her skin is normal, warm and dry and she is breathing normally.

## 2020-03-07 ENCOUNTER — Encounter: Payer: Self-pay | Admitting: Medical

## 2020-03-07 ENCOUNTER — Telehealth: Payer: Self-pay | Admitting: Medical

## 2020-03-07 ENCOUNTER — Other Ambulatory Visit: Payer: Self-pay

## 2020-03-07 ENCOUNTER — Telehealth (INDEPENDENT_AMBULATORY_CARE_PROVIDER_SITE_OTHER): Payer: 59 | Admitting: Medical

## 2020-03-07 VITALS — BP 130/80

## 2020-03-07 DIAGNOSIS — U071 COVID-19: Secondary | ICD-10-CM

## 2020-03-07 DIAGNOSIS — R5383 Other fatigue: Secondary | ICD-10-CM

## 2020-03-07 DIAGNOSIS — R1110 Vomiting, unspecified: Secondary | ICD-10-CM

## 2020-03-07 DIAGNOSIS — R110 Nausea: Secondary | ICD-10-CM

## 2020-03-07 MED ORDER — PROMETHAZINE HCL 12.5 MG PO TABS: 12.5000 mg | ORAL_TABLET | Freq: Three times a day (TID) | ORAL | 0 refills | Status: DC | PRN

## 2020-03-07 NOTE — Patient Instructions (Addendum)
Patient has recent Covid diagnosis 1 week ago.  She presented to the emergency department and was worked up then discharged.  Patient continues with same signs and symptoms per her report.  She states worse with nausea, vomiting and some dizziness.  Does report being fatigued.  She has tried Zofran 4 mg and it did not work.  She reports no shortness of breath, wheezing, chest pain or any gross motor/sensory function deficits other than feeling lightheaded.  Occasionally feels some vertigo.  She expresses concern about her pancreas but she never had any pancreatitis.  I explained in detail that under current circumstances it is best for her to be evaluated at the med center emergency department for labs to assess if she is dehydrated and they could check pancreas enzymes/lipase.  Patient never committed to going to the emergency department as advised recommended numerous times.  Did advise I could send in Phenergan tablets and see if this would help nausea/vomiting and if she could tolerate oral hydration with propel or sugar-free Gatorade.  If this does not help then advised ED evaluation today.  Asked her son to update me via MyChart early tomorrow morning as to how she is doing.  If she goes to ED as advised presently then they will send [place] an update note.  Patient then son expressed understanding on plan.  Follow-up in 1 week or as needed.  Also asked son to get pulse ox monitor at the pharmacy so we can check on both O2 sat and pulse readings.

## 2020-03-07 NOTE — Progress Notes (Signed)
Subjective:    Patient ID: [name], female    DOB: 17-May-1956, 64 y.o.   MRN: [id number]  HPI  Virtual Visit via Video Note  I connected with [name] on 79/89/21 at  1:00 PM EDT by a video enabled telemedicine application and verified that I am speaking with the correct person using two identifiers.  Location: Patient: home Provider: office    I discussed the limitations of evaluation and management by telemedicine and the availability of in person appointments. The patient expressed understanding and agreed to proceed.  History of Present Illness:   Pt has some stomach pain with constant sensation of upset stomach, nauseau and vomiting.   States dx with covid 19 last week in ED. HPI Patient is a 64 year old female with a past medical history detailed below presented today for subjective fevers, headache and fatigue since Monday.  She was tested for Covid on Tuesday and tested positive.  She denies any chest pain or shortness of breath, denies any unilateral leg swelling.  She states that she has been taking her rivaroxaban she has taken for years and has had no missed doses.  She denies any lightheadedness or dizziness.  States that she has been drinking plenty of water.  She has decreased taste and smell.  She states she otherwise feels well.  She has been taking Tylenol 1000 mg every 6 hours.  She has taken no other medications for symptoms.  Denies any cough, dyspnea or hemoptysis. She has no history of GI bleeds.  No history of heart disease.  She states that Tylenol helps but seems to wear off in 2-3 hours.  A/P in ED  Patient is known Covid positive patient.  Presented with fevers headache and fatigue malaise.  She denies any shortness of breath dyspnea or cough.  She states she is otherwise feeling well.  She has been taking Tylenol 1000 mg every 6 hours states that she still feels unwell and states fevers although she states she has not measured her  temperature at home.  Exam is unremarkable.  No dyspnea SPO2 is within normal limits with ambulation.  No unusual lung sounds.  Patient is well-appearing and will be discharged.  Conservative therapy at this time.  Discussed with on-call pharmacy at [facility].  Although ibuprofen is strictly contraindicated in this patient given lack of anemia or thrombocytopenia and normal creatinine function short 7-day course of low-dose 400 mg ibuprofen 3 times daily is not unreasonable.  Extensively counseled patient on only taking ibuprofen if she has a true fever even while taking Tylenol.  She was given Robaxin for aches fatigue headaches and discomfort.  She will only use ibuprofen as needed and will watch out for dark tarry stools or any signs of GI bleed.  [name] was evaluated in Emergency Department on 03/04/2020 for the symptoms described in the history of present illness. She was evaluated in the context of the global COVID-19 pandemic, which necessitated consideration that the patient might be at risk for infection with the SARS-CoV-2 virus that causes COVID-19. Institutional protocols and algorithms that pertain to the evaluation of patients at risk for COVID-19 are in a state of rapid change based on information released by regulatory bodies including the CDC and federal and state organizations. These policies and algorithms were followed during the patient's care in the ED.  Pt states feels worse compared to ED. Mostly the nausea. Pt not eating or drinking.  Observations/Objective:  General-no acute distress, pleasant, oriented. Looks uncomfortable. Lungs- on inspection lungs appear unlabored. Neck- no tracheal deviation or jvd on inspection. Neuro- gross motor function appears intact. Skin- upper lip feels dry on video.   Assessment and Plan: Patient has recent Covid diagnosis 1 week ago.  She presented to the emergency department and was worked up then discharged.   Patient continues with same signs and symptoms per her report.  She states worse with nausea, vomiting and some dizziness.  Does report being fatigued.  She has tried Zofran 4 mg and it did not work.  She reports no shortness of breath, wheezing, chest pain or any gross motor/sensory function deficits other than feeling lightheaded.  Occasionally feels some vertigo.  She expresses concern about her pancreas but she never had any pancreatitis.  I explained in detail that under current circumstances it is best for her to be evaluated at the med center emergency department for labs to assess if she is dehydrated and they could check pancreas enzymes/lipase.  Patient never committed to going to the emergency department as advised recommended numerous times.  Did advise I could send in Phenergan tablets and see if this would help nausea/vomiting and if she could tolerate oral hydration with propel or sugar-free Gatorade.  If this does not help then advised ED evaluation today.  Asked her son to update me via MyChart early tomorrow morning as to how she is doing.  If she goes to ED as advised presently then they will send [place] an update note.  Patient then son expressed understanding on plan.  Follow-up in 1 week or as needed.  Also asked son to get pulse ox monitor at the pharmacy so we can check on both O2 sat and pulse readings.  Time spent with patient today was 30  minutes which consisted of chart review, discussing diagnosis, needed work up, treatment and documentation.  Follow Up Instructions:    I discussed the assessment and treatment plan with the patient. The patient was provided an opportunity to ask questions and all were answered. The patient agreed with the plan and demonstrated an understanding of the instructions.   The patient was advised to call back or seek an in-person evaluation if the symptoms worsen or if the condition fails to improve as anticipated.     Mackie Pai,  PA-C   Review of Systems  Constitutional: Positive for fatigue. Negative for fever.  Respiratory: Negative for cough, chest tightness, shortness of breath and wheezing.   Cardiovascular: Negative for chest pain and palpitations.  Gastrointestinal: Positive for nausea and vomiting. Negative for abdominal pain, blood in stool and rectal pain.       If she eats will/  Musculoskeletal: Negative for back pain.  Skin: Negative for rash.  Neurological: Positive for dizziness. Negative for seizures, syncope, weakness and light-headedness.  Hematological: Negative for adenopathy. Does not bruise/bleed easily.  Psychiatric/Behavioral: Negative for behavioral problems and decreased concentration.       Objective:   Physical Exam        Assessment & Plan:

## 2020-03-07 NOTE — Telephone Encounter (Signed)
Would you call patient tomorrow morning and see how she is doing.  Recent Covid with nausea, vomiting fatigue and dizziness 1 week post diagnosis.  Concern she is dehydrated and advised ED evaluation at the med center.  She did not commit to going today so advised conservative measures but if those measures fail today then be seen in ED.  So please call and see how she is doing.  Let me know.

## 2020-03-08 NOTE — Telephone Encounter (Signed)
Left message to return call 

## 2020-03-09 ENCOUNTER — Emergency Department (HOSPITAL_BASED_OUTPATIENT_CLINIC_OR_DEPARTMENT_OTHER)
Admission: EM | Admit: 2020-03-09 | Discharge: 2020-03-09 | Disposition: A | Discharge status: Home / Self Care | Payer: 59 | Attending: Emergency Medicine | Admitting: Emergency Medicine

## 2020-03-09 ENCOUNTER — Encounter (HOSPITAL_BASED_OUTPATIENT_CLINIC_OR_DEPARTMENT_OTHER): Payer: Self-pay

## 2020-03-09 ENCOUNTER — Other Ambulatory Visit: Payer: Self-pay

## 2020-03-09 ENCOUNTER — Emergency Department (HOSPITAL_BASED_OUTPATIENT_CLINIC_OR_DEPARTMENT_OTHER): Payer: 59

## 2020-03-09 DIAGNOSIS — I10 Essential (primary) hypertension: Secondary | ICD-10-CM | POA: Diagnosis not present

## 2020-03-09 DIAGNOSIS — Z79899 Other long term (current) drug therapy: Secondary | ICD-10-CM | POA: Insufficient documentation

## 2020-03-09 DIAGNOSIS — R109 Unspecified abdominal pain: Secondary | ICD-10-CM

## 2020-03-09 DIAGNOSIS — Z85038 Personal history of other malignant neoplasm of large intestine: Secondary | ICD-10-CM | POA: Diagnosis not present

## 2020-03-09 DIAGNOSIS — R110 Nausea: Secondary | ICD-10-CM | POA: Insufficient documentation

## 2020-03-09 DIAGNOSIS — Z7901 Long term (current) use of anticoagulants: Secondary | ICD-10-CM | POA: Insufficient documentation

## 2020-03-09 LAB — COMPREHENSIVE METABOLIC PANEL
ALT: 130 U/L — ABNORMAL HIGH (ref 0–44)
AST: 179 U/L — ABNORMAL HIGH (ref 15–41)
Albumin: 4.1 g/dL (ref 3.5–5.0)
Alkaline Phosphatase: 185 U/L — ABNORMAL HIGH (ref 38–126)
Anion gap: 14 (ref 5–15)
BUN: 10 mg/dL (ref 8–23)
CO2: 27 mmol/L (ref 22–32)
Calcium: 8.6 mg/dL — ABNORMAL LOW (ref 8.9–10.3)
Chloride: 101 mmol/L (ref 98–111)
Creatinine, Ser: 0.84 mg/dL (ref 0.44–1.00)
GFR calc Af Amer: >60 mL/min (ref >60)
GFR calc non Af Amer: >60 mL/min (ref >60)
Glucose, Bld: 126 mg/dL — ABNORMAL HIGH (ref 70–99)
Potassium: 3.5 mmol/L (ref 3.5–5.1)
Sodium: 142 mmol/L (ref 135–145)
Total Bilirubin: 0.6 mg/dL (ref 0.3–1.2)
Total Protein: 7.1 g/dL (ref 6.5–8.1)

## 2020-03-09 LAB — URINALYSIS, ROUTINE W REFLEX MICROSCOPIC
Bilirubin Urine: NEGATIVE
Glucose, UA: NEGATIVE mg/dL
Hgb urine dipstick: NEGATIVE
Ketones, ur: NEGATIVE mg/dL
Nitrite: NEGATIVE
Protein, ur: NEGATIVE mg/dL
Specific Gravity, Urine: 1.015 (ref 1.005–1.030)
pH: 8.5 — ABNORMAL HIGH (ref 5.0–8.0)

## 2020-03-09 LAB — CBC
HCT: 41.3 % (ref 36.0–46.0)
Hemoglobin: 13.3 g/dL (ref 12.0–15.0)
MCH: 30.1 pg (ref 26.0–34.0)
MCHC: 32.2 g/dL (ref 30.0–36.0)
MCV: 93.4 fL (ref 80.0–100.0)
Platelets: 157 10*3/uL (ref 150–400)
RBC: 4.42 MIL/uL (ref 3.87–5.11)
RDW: 12.4 % (ref 11.5–15.5)
WBC: 3.4 10*3/uL — ABNORMAL LOW (ref 4.0–10.5)
nRBC: 0 % (ref 0.0–0.2)

## 2020-03-09 LAB — URINALYSIS, MICROSCOPIC (REFLEX): RBC / HPF: NONE SEEN RBC/hpf (ref 0–5)

## 2020-03-09 LAB — LIPASE, BLOOD: Lipase: 34 U/L (ref 11–51)

## 2020-03-09 MED ORDER — ONDANSETRON HCL 4 MG/2ML IJ SOLN
4.0000 mg | Freq: Once | INTRAMUSCULAR | Status: AC
  Administered 2020-03-09: 4 mg via INTRAVENOUS
  Filled 2020-03-09: qty 2

## 2020-03-09 MED ORDER — ONDANSETRON HCL 4 MG PO TABS
4.0000 mg | ORAL_TABLET | Freq: Three times a day (TID) | ORAL | 0 refills | Status: DC | PRN
Start: 2020-03-09 — End: 2020-03-21

## 2020-03-09 MED ORDER — IOHEXOL 300 MG/ML  SOLN
100.0000 mL | Freq: Once | INTRAMUSCULAR | Status: AC | PRN
  Administered 2020-03-09: 100 mL via INTRAVENOUS

## 2020-03-09 MED ORDER — SODIUM CHLORIDE 0.9% FLUSH
3.0000 mL | Freq: Once | INTRAVENOUS | Status: DC
  Filled 2020-03-09: qty 3

## 2020-03-09 MED ORDER — SODIUM CHLORIDE 0.9 % IV BOLUS
1000.0000 mL | Freq: Once | INTRAVENOUS | Status: AC
  Administered 2020-03-09: 1000 mL via INTRAVENOUS

## 2020-03-09 NOTE — ED Notes (Signed)
ED Provider at bedside. 

## 2020-03-09 NOTE — Discharge Instructions (Signed)
Continue Zofran at home for nausea.  Recommend increase oral hydration.  Follow-up with your primary care doctor.  Return to the ED if symptoms worsen.

## 2020-03-09 NOTE — ED Notes (Signed)
Pt drank water without vomiting.

## 2020-03-09 NOTE — ED Provider Notes (Addendum)
[facility] EMERGENCY DEPARTMENT Provider Note   CSN: [id number] Arrival date & time: 03/09/20  1911     History Chief Complaint  Patient presents with  . Abdominal Pain    [name] is a 64 y.o. female.  The history is provided by the patient.  Abdominal Pain Pain location:  Epigastric Pain quality: not aching   Pain radiates to:  Does not radiate Pain severity:  Mild Onset quality:  Gradual Timing:  Intermittent Progression:  Waxing and waning Chronicity:  New Context: recent illness (covid)   Relieved by:  Nothing Worsened by:  Nothing Associated symptoms: nausea and vomiting   Associated symptoms: no chest pain, no chills, no cough, no dysuria, no fever, no hematuria, no shortness of breath and no sore throat        Past Medical History:  Diagnosis Date  . Bilateral leg pain 07/02/2016  . Cancer ([place]) 08/03/2010   colon  . 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 History of colon cancer, stage I 08/17/2015  . HLD (hyperlipidemia)   . Hypertension   . Prediabetes 05/25/2019  . Uterine fibroid   . Varicose 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 08/26/2014    Patient Active Problem List   Diagnosis Date Noted  . 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 Prediabetes 05/25/2019  . 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 Bilateral leg pain 07/02/2016  . History of colon cancer, stage I 08/17/2015  . Varicose 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 08/26/2014  . HTN (hypertension) 12/24/2013  . Cancer of left colon ([place]) 02/25/2012  . DVT (deep venous thrombosis) ([place]) 08/27/2011    Past Surgical History:  Procedure Laterality Date  . ABDOMINAL HYSTERECTOMY    . CHOLECYSTECTOMY    . COLON SURGERY    . GALLBLADDER SURGERY    . MEDIASTERNOTOMY N/A 11/24/2017   Procedure: PARTIAL STERNOTOMY;  Surgeon: [name], MD;  Location: [facility];  Service: Thoracic;  Laterality: N/A;  . THYMECTOMY  11/24/2017    Procedure: THYMECTOMY;  Surgeon: [name], MD;  Location: [facility] OR;  Service: Thoracic;;     OB History   No obstetric history on file.     Family History  Problem Relation Age of Onset  . Heart attack Mother   . Hypertension Father     Social History   Tobacco Use  . Smoking status: Never Smoker  . Smokeless tobacco: Never Used  Vaping Use  . Vaping Use: Never used  Substance Use Topics  . Alcohol use: No    Alcohol/week: 0.0 standard drinks  . Drug use: No    Home Medications Prior to Admission medications   Medication Sig Start Date End Date Taking? Authorizing Provider  amLODipine (NORVASC) 10 MG tablet Take 1 tablet (10 mg total) by mouth daily. 05/26/19 05/25/20  Copland, Gay Filler, MD  Evolocumab (REPATHA SURECLICK) 734 MG/ML SOAJ Inject 1 pen into the skin every 14 (fourteen) days. 01/11/20   Dorothy Spark, MD  hydrALAZINE (APRESOLINE) 50 MG tablet Take 1 tablet (50 mg total) by mouth 3 (three) times daily. 02/23/20 05/23/20  [name], PA-C  ibuprofen (ADVIL) 400 MG tablet Take 1 tablet (400 mg total) by mouth every 6 (six) hours as needed for up to 7 days for fever or moderate pain. Take WITH FOOD. Drink plenty of water Discontinue if you notice dark, tarry stool 03/04/20 03/11/20  Tedd Sias, PA  lisinopril (ZESTRIL) 40 MG tablet TAKE 1 TABLET BY MOUTH EVERY DAY 05/27/19   Copland, Gay Filler, MD  methocarbamol (ROBAXIN) 500 MG tablet Take 1 tablet (500 mg total) by mouth 2 (two) times daily. 03/04/20   Tedd Sias, PA  ondansetron (ZOFRAN ODT) 4 MG disintegrating tablet Take 1 tablet (4 mg total) by mouth every 8 (eight) hours as needed for nausea or vomiting. 03/04/20   Fondaw, Kathleene Hazel, PA  ondansetron (ZOFRAN) 4 MG tablet Take 1 tablet (4 mg total) by mouth every 8 (eight) hours as needed for up to 20 doses for nausea or vomiting. 03/09/20   Florena Kozma, DO  oxyCODONE-acetaminophen (PERCOCET/ROXICET) 5-325 MG tablet Take 1 tablet by  mouth as needed.    [provider]  promethazine (PHENERGAN) 12.5 MG tablet Take 1 tablet (12.5 mg total) by mouth every 8 (eight) hours as needed for nausea or vomiting. 03/07/20   Saguier, Percell Miller, PA-C  rivaroxaban (XARELTO) 20 MG TABS tablet TAKE 1 TABLET BY MOUTH DAILY WITH SUPPER 05/26/19   Copland, Gay Filler, MD    Allergies    Patient has no known allergies.  Review of Systems   Review of Systems  Constitutional: Negative for chills and fever.  HENT: Negative for ear pain and sore throat.   Eyes: Negative for pain and visual disturbance.  Respiratory: Negative for cough and shortness of breath.   Cardiovascular: Negative for chest pain and palpitations.  Gastrointestinal: Positive for abdominal pain, nausea and vomiting.  Genitourinary: Negative for dysuria and hematuria.  Musculoskeletal: Negative for arthralgias and back pain.  Skin: Negative for color change and rash.  Neurological: Negative for seizures and syncope.  All other systems reviewed and are negative.   Physical Exam Updated Vital Signs BP (!) 163/80   Pulse 73   Temp 99.1 F (37.3 C) (Oral)   Resp 18   Ht 5\' 7"  (1.702 m)   Wt 84.8 kg   SpO2 94%   BMI 29.29 kg/m   Physical Exam Vitals and nursing note reviewed.  Constitutional:      General: She is not in acute distress.    Appearance: She is well-developed.  HENT:     Head: Normocephalic and atraumatic.     Mouth/Throat:     Mouth: Mucous membranes are moist.  Eyes:     Extraocular Movements: Extraocular movements intact.     Conjunctiva/sclera: Conjunctivae normal.     Pupils: Pupils are equal, round, and reactive to light.  Cardiovascular:     Rate and Rhythm: Normal rate and regular rhythm.     Heart sounds: Normal heart sounds. No murmur heard.   Pulmonary:     Effort: Pulmonary effort is normal. No respiratory distress.     Breath sounds: Normal breath sounds.  Abdominal:     Palpations: Abdomen is soft.     Tenderness:  There is abdominal tenderness in the epigastric area. There is no guarding or rebound.  Musculoskeletal:     Cervical back: Neck supple.  Skin:    General: Skin is warm and dry.     Capillary Refill: Capillary refill takes less than 2 seconds.  Neurological:     Mental Status: She is alert.     ED Results / Procedures / Treatments   Labs (all labs ordered are listed, but only abnormal results are displayed) Labs Reviewed  COMPREHENSIVE METABOLIC PANEL - Abnormal; Notable for the following components:      Result Value   Glucose, Bld  126 (*)    Calcium 8.6 (*)    AST 179 (*)    ALT 130 (*)    Alkaline Phosphatase 185 (*)    All other components within normal limits  CBC - Abnormal; Notable for the following components:   WBC 3.4 (*)    All other components within normal limits  URINALYSIS, ROUTINE W REFLEX MICROSCOPIC - Abnormal; Notable for the following components:   pH 8.5 (*)    Leukocytes,Ua SMALL (*)    All other components within normal limits  URINALYSIS, MICROSCOPIC (REFLEX) - Abnormal; Notable for the following components:   Bacteria, UA RARE (*)    All other components within normal limits  LIPASE, BLOOD    EKG None  Radiology CT ABDOMEN PELVIS W CONTRAST  Result Date: 03/09/2020 CLINICAL DATA:  Abdominal distension and pain for 2 days. Patient is COVID-19 positive. EXAM: CT ABDOMEN AND PELVIS WITH CONTRAST TECHNIQUE: Multidetector CT imaging of the abdomen and pelvis was performed using the standard protocol following bolus administration of intravenous contrast. CONTRAST:  139mL OMNIPAQUE IOHEXOL 300 MG/ML  SOLN COMPARISON:  CT chest, abdomen pelvis dated 03/10/2013. FINDINGS: Lower chest: Peripheral predominant airspace opacities are partially imaged. Hepatobiliary: No focal liver abnormality is seen. Status post cholecystectomy. No biliary dilatation. Pancreas: Unremarkable. No pancreatic ductal dilatation or surrounding inflammatory changes. Spleen: Normal in  size without focal abnormality. Adrenals/Urinary Tract: Adrenal glands are unremarkable. Kidneys are normal, without renal calculi, focal lesion, or hydronephrosis. Bladder is unremarkable. Stomach/Bowel: Stomach is within normal limits. Appendix appears normal. No evidence of bowel wall thickening, distention, or inflammatory changes. A surgical anastomosis is seen in the pelvis. Vascular/Lymphatic: Aortic atherosclerosis. There are prominent mesenteric lymph nodes with associated moderate mesenteric fat stranding. No enlarged pelvic lymph nodes. Reproductive: Status post hysterectomy. No adnexal masses. Other: No abdominal wall hernia or abnormality. No abdominopelvic ascites. Musculoskeletal: No acute or significant osseous findings. IMPRESSION: 1. 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 2. Peripheral predominant airspace opacities are partially imaged and likely represent COVID-19 pneumonia. Aortic Atherosclerosis (ICD10-I70.0). Electronically Signed   By: Zerita Boers M.D.   On: 03/09/2020 22:31    Procedures Procedures (including critical care time)  Medications Ordered in ED Medications  sodium chloride flush (NS) 0.9 % injection 3 mL (3 mLs Intravenous Not Given 03/09/20 2129)  ondansetron (ZOFRAN) injection 4 mg (4 mg Intravenous Given 03/09/20 2140)  sodium chloride 0.9 % bolus 1,000 mL (0 mLs Intravenous Stopped 03/09/20 2246)  iohexol (OMNIPAQUE) 300 MG/ML solution 100 mL (100 mLs Intravenous Contrast Given 03/09/20 2205)    ED Course  I have reviewed the triage vital signs and the nursing notes.  Pertinent labs & imaging results that were available during my care of the patient were reviewed by me and considered in my medical decision making (see chart for details).    MDM Rules/Calculators/A&P                          [name] is a 64 year old female with history of hypertension, high  cholesterol who presents the ED with nausea, vomiting, epigastric abdominal pain.  Has had coronavirus but does not have any respiratory symptoms.  Normal vitals.  No fever.  States that she has a history of this pain in the past.  Has had her gallbladder removed in the past.  Tender mostly in the epigastric region.  Patient with white count of 3.4 but  otherwise no significant anemia.  Liver enzymes mildly elevated.  However lipase normal.  Bilirubin normal.  Urinalysis negative for infection.  CT scan shows some mesenteric panniculitis and some COVID changes in the lungs.  Otherwise CT scan is unremarkable.  Gallbladder and liver showed no significant findings.  Suspect mild elevation liver enzymes secondary to some dehydration.  Patient got IV fluids, IV Zofran with great improvement of symptoms.  Overall suspect pain from panniculitis.  Recommend continued nausea medicine and pain control at home.  Given return precautions and discharged in ED in good condition.  I will have patient follow-up with primary care doctor to recheck liver enzymes.  This chart was dictated using voice recognition software.  Despite best efforts to proofread,  errors can occur which can change the documentation meaning.     Final Clinical Impression(s) / ED Diagnoses Final diagnoses:  Abdominal pain, unspecified abdominal location  Nausea    Rx / DC Orders ED Discharge Orders         Ordered    ondansetron (ZOFRAN) 4 MG tablet  Every 8 hours PRN     Discontinue  Reprint     03/09/20 2256           Lennice Sites, DO 03/09/20 2257    Ronnald Nian, Olmito and Olmito, DO 03/23/20 1107

## 2020-03-09 NOTE — ED Triage Notes (Signed)
C/o abd pain, n/v x 2 days-pt states +covid 7/20-NAD-steady gait

## 2020-03-15 ENCOUNTER — Ambulatory Visit (INDEPENDENT_AMBULATORY_CARE_PROVIDER_SITE_OTHER): Payer: 59 | Admitting: Family Medicine

## 2020-03-15 ENCOUNTER — Encounter: Payer: Self-pay | Admitting: Family Medicine

## 2020-03-15 ENCOUNTER — Ambulatory Visit (HOSPITAL_COMMUNITY): Payer: 59

## 2020-03-15 ENCOUNTER — Other Ambulatory Visit: Payer: Self-pay

## 2020-03-15 VITALS — BP 142/80 | HR 81 | Temp 98.2°F

## 2020-03-15 DIAGNOSIS — R7401 Elevation of levels of liver transaminase levels: Secondary | ICD-10-CM | POA: Diagnosis not present

## 2020-03-15 DIAGNOSIS — I880 Nonspecific mesenteric lymphadenitis: Secondary | ICD-10-CM | POA: Diagnosis not present

## 2020-03-15 LAB — HEPATIC FUNCTION PANEL
ALT: 131 U/L — ABNORMAL HIGH (ref 0–35)
AST: 102 U/L — ABNORMAL HIGH (ref 0–37)
Albumin: 4.2 g/dL (ref 3.5–5.2)
Alkaline Phosphatase: 271 U/L — ABNORMAL HIGH (ref 39–117)
Bilirubin, Direct: 0.2 mg/dL (ref 0.0–0.3)
Total Bilirubin: 0.7 mg/dL (ref 0.2–1.2)
Total Protein: 6.5 g/dL (ref 6.0–8.3)

## 2020-03-15 NOTE — Progress Notes (Addendum)
[facility] [address], [place], [place] [address] [phone]  Date:  03/18/5783   Name:  [name]   DOB:  January 17, 1956   MRN:  [id number]  PCP:  Darreld Mclean, MD    Chief Complaint: Hepatitis   History of Present Illness:  [name] is a 64 y.o. very pleasant female patient who presents with the following:  Patient here today for a follow-up visit Last seen by myself in April of this year for preoperative clearance for planned rotator cuff repair She has history of colon cancer, recurrent DVT, hypertension, prediabetes, stage I thymoma status post thymectomy 2019  She also sees cardiology, they have seen her a few times recently due to elevated blood pressure. She has also been intolerant to statins, prescribed Repatha-?  Was is approved Most recent cardiology visit on 7/14  She was seen in the ER on 7/24 with diagnosis of COVID-19-she was actually diagnosed a few days prior, presented to the ER with headache and fatigue.  She was evaluated and released to home Represented to the ER on 7/29 with abdominal pain and was evaluated  She underwent a CT scan which showed mesenteric panniculitis-she was again released to home CT abdomen pelvis 03/09/2020 IMPRESSION: 1. 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 2. Peripheral predominant airspace opacities are partially imaged and likely represent COVID-19 pneumonia.  Her LFTs were also quite high at that visit- will recheck today  Hilary notes that she is feeling much better, nearly back to normal No more fever, occasional cough, belly pain is resolved She is eating normally  No vomiting, no diarrhea, no constipation  She was diagnosed with COVID-19 just over 2 weeks ago now, so we are able to bring her in the office in person  Patient Active Problem List    Diagnosis Date Noted  . 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 Prediabetes 05/25/2019  . 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 Bilateral leg pain 07/02/2016  . History of colon cancer, stage I 08/17/2015  . Varicose 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 08/26/2014  . HTN (hypertension) 12/24/2013  . Cancer of left colon ([place]) 02/25/2012  . DVT (deep venous thrombosis) ([place]) 08/27/2011    Past Medical History:  Diagnosis Date  . Bilateral leg pain 07/02/2016  . Cancer ([place]) 08/03/2010   colon  . 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 History of colon cancer, stage I 08/17/2015  . HLD (hyperlipidemia)   . Hypertension   . Prediabetes 05/25/2019  . Uterine fibroid   . Varicose 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 08/26/2014    Past Surgical History:  Procedure Laterality Date  . ABDOMINAL HYSTERECTOMY    . CHOLECYSTECTOMY    . COLON SURGERY    . GALLBLADDER SURGERY    . MEDIASTERNOTOMY N/A 11/24/2017   Procedure: PARTIAL STERNOTOMY;  Surgeon: [name], MD;  Location: [facility];  Service: Thoracic;  Laterality: N/A;  . THYMECTOMY  11/24/2017   Procedure: THYMECTOMY;  Surgeon: [name], MD;  Location: [facility] OR;  Service: Thoracic;;    Social History   Tobacco Use  . Smoking status: Never Smoker  . Smokeless tobacco: Never Used  Vaping Use  . Vaping Use: Never used  Substance Use Topics  . Alcohol use: No    Alcohol/week: 0.0 standard drinks  .  Drug use: No    Family History  Problem Relation Age of Onset  . Heart attack Mother   . Hypertension Father     No Known Allergies  Medication list has been reviewed and updated.  Current Outpatient Medications on File Prior to Visit  Medication Sig Dispense Refill  . amLODipine (NORVASC) 10 MG tablet Take 1 tablet (10 mg total) by mouth daily. 90 tablet 3  . Evolocumab (REPATHA SURECLICK) 921 MG/ML SOAJ Inject 1 pen into the skin every 14 (fourteen) days. 2 pen  11  . hydrALAZINE (APRESOLINE) 50 MG tablet Take 1 tablet (50 mg total) by mouth 3 (three) times daily. 270 tablet 3  . lisinopril (ZESTRIL) 40 MG tablet TAKE 1 TABLET BY MOUTH EVERY DAY 90 tablet 3  . methocarbamol (ROBAXIN) 500 MG tablet Take 1 tablet (500 mg total) by mouth 2 (two) times daily. 20 tablet 0  . ondansetron (ZOFRAN) 4 MG tablet Take 1 tablet (4 mg total) by mouth every 8 (eight) hours as needed for up to 20 doses for nausea or vomiting. 20 tablet 0  . oxyCODONE-acetaminophen (PERCOCET/ROXICET) 5-325 MG tablet Take 1 tablet by mouth as needed.    . rivaroxaban (XARELTO) 20 MG TABS tablet TAKE 1 TABLET BY MOUTH DAILY WITH SUPPER 90 tablet 3  . promethazine (PHENERGAN) 12.5 MG tablet Take 1 tablet (12.5 mg total) by mouth every 8 (eight) hours as needed for nausea or vomiting. (Patient not taking: Reported on 03/15/2020) 20 tablet 0   No current facility-administered medications on file prior to visit.    Review of Systems:  As per HPI- otherwise negative.   Physical Examination: Vitals:   03/15/20 1323  BP: (!) 142/80  Pulse: 81  Temp: 98.2 F (36.8 C)  SpO2: 97%   There were no vitals filed for this visit. There is no height or weight on file to calculate BMI. Ideal Body Weight:     GEN: no acute distress.  Looks well, tall build HEENT: Atraumatic, Normocephalic.  Did not remove patient mask today due to recent diagnosis of COVID-19 Ears and Nose: No external deformity. CV: RRR, No M/G/R. No JVD. No thrill. No extra heart sounds. PULM: CTA B, no wheezes, crackles, rhonchi. No retractions. No resp. distress. No accessory muscle use. ABD: S, NT, ND, +BS. No rebound. No HSM.  Belly is benign EXTR: No c/c/e PSYCH: Normally interactive. Conversant.     Assessment and Plan: Transaminitis - Plan: Hepatic function panel  Mesenteric adenitis - Plan: Hepatic function panel  Patient seen today for follow-up from recent ER visit.  She was seen in the emergency room on  July 29 with generalized malaise and abdominal pain.  CT abdomen revealed mesenteric adenitis, she is also noted to have significant transaminitis.  We will recheck her LFTs today.  Assuming these are trending down, she should do well.  She also did recently have COVID-19 but is recovering well from this illness.  No fever or respiratory symptoms at this time  This visit occurred during the SARS-CoV-2 public health emergency.  Safety protocols were in place, including screening questions prior to the visit, additional usage of staff PPE, and extensive cleaning of exam room while observing appropriate contact time as indicated for disinfecting solutions.     Signed Lamar Blinks, MD  Addendum 8/5, received her labs as below Message to patient Transaminitis is new, suspect related to COVID-19 infection Will need to bring her in for repeat liver enzymes in 2 weeks, will  also check an acute hepatitis panel at that time I have also sent a message to my nurse to call patient and get her scheduled for labs, I am afraid she may not see her MyChart message   I am afraid your liver function tests-alkaline phosphatase, AST and ALT-are still elevated.  In your situation, this is most likely due to COVID-19 infection  I am hoping that these tests will return to normal within a few weeks.  Assuming you are feeling well, we can plan to recheck your levels in about 2 weeks as a lab visit only  If you start to feel sick again in the meantime, please contact me right away Results for orders placed or performed in visit on 03/15/20  Hepatic function panel  Result Value Ref Range   Total Bilirubin 0.7 0.2 - 1.2 mg/dL   Bilirubin, Direct 0.2 0.0 - 0.3 mg/dL   Alkaline Phosphatase 271 (H) 39 - 117 U/L   AST 102 (H) 0 - 37 U/L   ALT 131 (H) 0 - 35 U/L   Total Protein 6.5 6.0 - 8.3 g/dL   Albumin 4.2 3.5 - 5.2 g/dL

## 2020-03-15 NOTE — Patient Instructions (Signed)
Good to see you again today!  I will be in touch with your liver tests. They may not be back to normal yet- if still elevated we will recheck in a few weeks  Take care!  Please see me in 6 months for a routine visit

## 2020-03-16 NOTE — Addendum Note (Signed)
Addended by: Lamar Blinks C on: 03/16/2020 08:51 PM   Modules accepted: Orders

## 2020-03-17 ENCOUNTER — Telehealth: Payer: Self-pay

## 2020-03-17 NOTE — Telephone Encounter (Signed)
Patient has been scheduled for recheck labs in two weeks.

## 2020-03-17 NOTE — Telephone Encounter (Signed)
-----   Message from Darreld Mclean, MD sent at 03/16/2020  8:47 PM EDT ----- Elease Etienne  Can you please try to call K[place] tomorrow-we need to get her scheduled for repeat liver enzymes in about 2 weeks As you recall, she recently had COVID-19.  This is likely causing an increase in her liver enzyme tests, we need to follow them until normal Her labs are already ordered, I sent her a MyChart message been afraid she will not see it  Thank you so much JC

## 2020-03-20 ENCOUNTER — Other Ambulatory Visit: Payer: Self-pay

## 2020-03-20 DIAGNOSIS — Z85038 Personal history of other malignant neoplasm of large intestine: Secondary | ICD-10-CM | POA: Diagnosis not present

## 2020-03-20 DIAGNOSIS — R2681 Unsteadiness on feet: Secondary | ICD-10-CM | POA: Diagnosis not present

## 2020-03-20 DIAGNOSIS — I82409 Acute embolism and thrombosis of unspecified deep veins of unspecified lower extremity: Secondary | ICD-10-CM | POA: Diagnosis not present

## 2020-03-20 DIAGNOSIS — Z20822 Contact with and (suspected) exposure to covid-19: Secondary | ICD-10-CM | POA: Insufficient documentation

## 2020-03-20 DIAGNOSIS — R945 Abnormal results of liver function studies: Secondary | ICD-10-CM | POA: Insufficient documentation

## 2020-03-20 DIAGNOSIS — K654 Sclerosing mesenteritis: Secondary | ICD-10-CM | POA: Insufficient documentation

## 2020-03-20 DIAGNOSIS — Z79899 Other long term (current) drug therapy: Secondary | ICD-10-CM | POA: Insufficient documentation

## 2020-03-20 DIAGNOSIS — I10 Essential (primary) hypertension: Secondary | ICD-10-CM | POA: Insufficient documentation

## 2020-03-20 DIAGNOSIS — U071 COVID-19: Principal | ICD-10-CM | POA: Insufficient documentation

## 2020-03-20 DIAGNOSIS — I639 Cerebral infarction, unspecified: Secondary | ICD-10-CM | POA: Diagnosis not present

## 2020-03-20 DIAGNOSIS — E785 Hyperlipidemia, unspecified: Secondary | ICD-10-CM | POA: Insufficient documentation

## 2020-03-20 DIAGNOSIS — R42 Dizziness and giddiness: Secondary | ICD-10-CM | POA: Diagnosis present

## 2020-03-20 NOTE — ED Provider Notes (Signed)
MSE was initiated and I personally evaluated the patient and placed orders (if any) at  11:59 PM on March 20, 2020.  The patient appears stable so that the remainder of the MSE may be completed by another provider.  Asked by triage nurse to evaluate the patient as a possible code stroke.  Acute onset of dizziness and difficulty with ambulation.  Son states that last seen normal approximately 10:30 PM.  She is not having any headache.  They state that I have had some issues with high blood pressure recently.  There is some language barrier as she is from [place].  She is nontoxic-appearing.  Seems to have fluent speech to her son.  Has had some emesis.  Patient cleared for CT.  We will have teleneurology evaluate.   Merryl Hacker, MD 03/21/20 0001

## 2020-03-21 ENCOUNTER — Observation Stay (HOSPITAL_COMMUNITY): Payer: 59

## 2020-03-21 ENCOUNTER — Emergency Department (HOSPITAL_BASED_OUTPATIENT_CLINIC_OR_DEPARTMENT_OTHER): Payer: 59

## 2020-03-21 ENCOUNTER — Encounter (HOSPITAL_BASED_OUTPATIENT_CLINIC_OR_DEPARTMENT_OTHER): Payer: Self-pay

## 2020-03-21 ENCOUNTER — Observation Stay (HOSPITAL_BASED_OUTPATIENT_CLINIC_OR_DEPARTMENT_OTHER)
Admission: EM | Admit: 2020-03-21 | Discharge: 2020-03-22 | Disposition: A | Discharge status: Home / Self Care | Payer: 59 | Attending: Internal Medicine | Admitting: Internal Medicine

## 2020-03-21 DIAGNOSIS — G459 Transient cerebral ischemic attack, unspecified: Secondary | ICD-10-CM | POA: Diagnosis not present

## 2020-03-21 DIAGNOSIS — I639 Cerebral infarction, unspecified: Secondary | ICD-10-CM | POA: Diagnosis not present

## 2020-03-21 DIAGNOSIS — R42 Dizziness and giddiness: Secondary | ICD-10-CM

## 2020-03-21 DIAGNOSIS — Z79899 Other long term (current) drug therapy: Secondary | ICD-10-CM | POA: Diagnosis not present

## 2020-03-21 DIAGNOSIS — E785 Hyperlipidemia, unspecified: Secondary | ICD-10-CM | POA: Diagnosis not present

## 2020-03-21 DIAGNOSIS — R110 Nausea: Secondary | ICD-10-CM

## 2020-03-21 DIAGNOSIS — R2681 Unsteadiness on feet: Secondary | ICD-10-CM | POA: Diagnosis not present

## 2020-03-21 DIAGNOSIS — I10 Essential (primary) hypertension: Secondary | ICD-10-CM | POA: Diagnosis present

## 2020-03-21 DIAGNOSIS — R945 Abnormal results of liver function studies: Secondary | ICD-10-CM | POA: Diagnosis not present

## 2020-03-21 DIAGNOSIS — I82409 Acute embolism and thrombosis of unspecified deep veins of unspecified lower extremity: Secondary | ICD-10-CM | POA: Diagnosis present

## 2020-03-21 DIAGNOSIS — U071 COVID-19: Secondary | ICD-10-CM | POA: Diagnosis present

## 2020-03-21 DIAGNOSIS — R2990 Unspecified symptoms and signs involving the nervous system: Secondary | ICD-10-CM | POA: Insufficient documentation

## 2020-03-21 DIAGNOSIS — R748 Abnormal levels of other serum enzymes: Secondary | ICD-10-CM | POA: Diagnosis present

## 2020-03-21 DIAGNOSIS — Z85038 Personal history of other malignant neoplasm of large intestine: Secondary | ICD-10-CM | POA: Diagnosis not present

## 2020-03-21 DIAGNOSIS — K654 Sclerosing mesenteritis: Secondary | ICD-10-CM | POA: Diagnosis not present

## 2020-03-21 DIAGNOSIS — Z20822 Contact with and (suspected) exposure to covid-19: Secondary | ICD-10-CM | POA: Diagnosis not present

## 2020-03-21 LAB — CBC
HCT: 38 % (ref 36.0–46.0)
Hemoglobin: 12.7 g/dL (ref 12.0–15.0)
MCH: 30.9 pg (ref 26.0–34.0)
MCHC: 33.4 g/dL (ref 30.0–36.0)
MCV: 92.5 fL (ref 80.0–100.0)
Platelets: 202 10*3/uL (ref 150–400)
RBC: 4.11 MIL/uL (ref 3.87–5.11)
RDW: 12.7 % (ref 11.5–15.5)
WBC: 7.5 10*3/uL (ref 4.0–10.5)
nRBC: 0 % (ref 0.0–0.2)

## 2020-03-21 LAB — COMPREHENSIVE METABOLIC PANEL
ALT: 59 U/L — ABNORMAL HIGH (ref 0–44)
AST: 33 U/L (ref 15–41)
Albumin: 4 g/dL (ref 3.5–5.0)
Alkaline Phosphatase: 165 U/L — ABNORMAL HIGH (ref 38–126)
Anion gap: 10 (ref 5–15)
BUN: 14 mg/dL (ref 8–23)
CO2: 24 mmol/L (ref 22–32)
Calcium: 8.9 mg/dL (ref 8.9–10.3)
Chloride: 105 mmol/L (ref 98–111)
Creatinine, Ser: 0.75 mg/dL (ref 0.44–1.00)
GFR calc Af Amer: >60 mL/min (ref >60)
GFR calc non Af Amer: >60 mL/min (ref >60)
Glucose, Bld: 166 mg/dL — ABNORMAL HIGH (ref 70–99)
Potassium: 3.5 mmol/L (ref 3.5–5.1)
Sodium: 139 mmol/L (ref 135–145)
Total Bilirubin: 0.3 mg/dL (ref 0.3–1.2)
Total Protein: 6.6 g/dL (ref 6.5–8.1)

## 2020-03-21 LAB — RAPID URINE DRUG SCREEN, HOSP PERFORMED
Amphetamines: NOT DETECTED
Barbiturates: NOT DETECTED
Benzodiazepines: NOT DETECTED
Cocaine: NOT DETECTED
Opiates: NOT DETECTED
Tetrahydrocannabinol: NOT DETECTED

## 2020-03-21 LAB — DIFFERENTIAL
Abs Immature Granulocytes: 0.05 10*3/uL (ref 0.00–0.07)
Basophils Absolute: 0.1 10*3/uL (ref 0.0–0.1)
Basophils Relative: 1 %
Eosinophils Absolute: 0 10*3/uL (ref 0.0–0.5)
Eosinophils Relative: 0 %
Immature Granulocytes: 1 %
Lymphocytes Relative: 11 %
Lymphs Abs: 0.9 10*3/uL (ref 0.7–4.0)
Monocytes Absolute: 0.5 10*3/uL (ref 0.1–1.0)
Monocytes Relative: 7 %
Neutro Abs: 6.1 10*3/uL (ref 1.7–7.7)
Neutrophils Relative %: 80 %

## 2020-03-21 LAB — URINALYSIS, ROUTINE W REFLEX MICROSCOPIC
Bilirubin Urine: NEGATIVE
Glucose, UA: NEGATIVE mg/dL
Hgb urine dipstick: NEGATIVE
Ketones, ur: NEGATIVE mg/dL
Leukocytes,Ua: NEGATIVE
Nitrite: NEGATIVE
Protein, ur: NEGATIVE mg/dL
Specific Gravity, Urine: 1.025 (ref 1.005–1.030)
pH: 7.5 (ref 5.0–8.0)

## 2020-03-21 LAB — PROTIME-INR
INR: 1 (ref 0.8–1.2)
Prothrombin Time: 12.4 seconds (ref 11.4–15.2)

## 2020-03-21 LAB — ETHANOL: Alcohol, Ethyl (B): <10 mg/dL (ref <10)

## 2020-03-21 LAB — SARS CORONAVIRUS 2 BY RT PCR (HOSPITAL ORDER, PERFORMED IN ~~LOC~~ HOSPITAL LAB): SARS Coronavirus 2: POSITIVE — AB

## 2020-03-21 LAB — APTT: aPTT: 28 seconds (ref 24–36)

## 2020-03-21 LAB — LIPASE, BLOOD: Lipase: 39 U/L (ref 11–51)

## 2020-03-21 LAB — CBG MONITORING, ED: Glucose-Capillary: 192 mg/dL — ABNORMAL HIGH (ref 70–99)

## 2020-03-21 MED ORDER — IOHEXOL 300 MG/ML  SOLN
100.0000 mL | Freq: Once | INTRAMUSCULAR | Status: AC | PRN
  Administered 2020-03-21: 100 mL via INTRAVENOUS

## 2020-03-21 MED ORDER — ACETAMINOPHEN 160 MG/5ML PO SOLN: 650.0000 mg | ORAL | Status: DC | PRN

## 2020-03-21 MED ORDER — SODIUM CHLORIDE 0.9 % IV BOLUS
1000.0000 mL | Freq: Once | INTRAVENOUS | Status: AC
  Administered 2020-03-21: 1000 mL via INTRAVENOUS

## 2020-03-21 MED ORDER — PROMETHAZINE HCL 25 MG/ML IJ SOLN
INTRAMUSCULAR | Status: AC
  Administered 2020-03-21: 12.5 mg via INTRAVENOUS
  Filled 2020-03-21: qty 1

## 2020-03-21 MED ORDER — ASPIRIN EC 81 MG PO TBEC
81.0000 mg | DELAYED_RELEASE_TABLET | Freq: Every day | ORAL | Status: DC
  Administered 2020-03-21: 81 mg via ORAL
  Filled 2020-03-21 (×2): qty 1

## 2020-03-21 MED ORDER — RIVAROXABAN 20 MG PO TABS
20.0000 mg | ORAL_TABLET | Freq: Every day | ORAL | Status: DC
  Administered 2020-03-22: 20 mg via ORAL
  Filled 2020-03-21: qty 1

## 2020-03-21 MED ORDER — ACETAMINOPHEN 650 MG RE SUPP: 650.0000 mg | RECTAL | Status: DC | PRN

## 2020-03-21 MED ORDER — ONDANSETRON HCL 4 MG/2ML IJ SOLN
4.0000 mg | Freq: Once | INTRAMUSCULAR | Status: DC
  Filled 2020-03-21: qty 2

## 2020-03-21 MED ORDER — PROMETHAZINE HCL 25 MG/ML IJ SOLN: 12.5000 mg | Freq: Once | INTRAMUSCULAR | Status: AC

## 2020-03-21 MED ORDER — GADOBUTROL 1 MMOL/ML IV SOLN
8.6000 mL | Freq: Once | INTRAVENOUS | Status: AC | PRN
  Administered 2020-03-21: 8.6 mL via INTRAVENOUS

## 2020-03-21 MED ORDER — STROKE: EARLY STAGES OF RECOVERY BOOK
Freq: Once | Status: AC
  Filled 2020-03-21 (×2): qty 1

## 2020-03-21 MED ORDER — ACETAMINOPHEN 325 MG PO TABS: 650.0000 mg | ORAL_TABLET | ORAL | Status: DC | PRN

## 2020-03-21 NOTE — H&P (Signed)
History and Physical    [name] WCB:[id number] DOB: 1956/01/22 DOA: 03/21/2020  PCP: Darreld Mclean, MD  Patient coming from: [facility]  I have personally briefly reviewed patient's old medical records in [place]  Chief Complaint: Dizziness, gait disturbance and vomiting  HPI: [name] is a 64 y.o. female with medical history significant of hypertension, hyperlipidemia, prediabetes, colon cancer, DVT on Xarelto presents to emergency department with sudden onset of dizziness, gait disturbance and vomiting.  Patient tells me that around 10:30 PM last night she had acute onset of dizziness, lightheadedness and she started vomiting, 7-8 episodes, nonbloody, foul-smelling vomitus associated with abdominal pain.  Reports that she had difficulty with her walking as well.  Her son checked her blood pressure which was 180/90.  No history of headache, blurry vision, slurred speech, facial drop, numbness weakness tingling sensation in hands or feet, seizures, loss of consciousness, head trauma, fever, chills, chest pain, shortness of breath, wheezing, cough, congestion, decreased appetite, generalized weakness or lethargy.  She tested positive in late July.  She denies any respiratory symptoms.  She tells me that she is not vaccinated against COVID-19.  No history of smoking, alcohol, listed drug use.  ED Course: Upon arrival to ED: Patient blood pressure was noted to be elevated, afebrile, maintaining oxygen saturation on room air, initial work-up including CBC, UA, UDS, PT/INR, APTT, ethanol, lipase: WNL, CMP shows of ALT: 539, alkaline phosphatase: 165, COVID-19 positive.  CT head came back negative for acute findings, CT abdomen/pelvis shows stable findings suggestive of mesenteric panniculitis.  Patient was given IV fluid bolus and Phenergan in ED.  Telemetry neurology consulted who recommended transfer patient to [facility] for full stroke work-up.  Review of Systems: As  per HPI otherwise negative.    Past Medical History:  Diagnosis Date  . Bilateral leg pain 07/02/2016  . Cancer ([place]) 08/03/2010   colon  . 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 History of colon cancer, stage I 08/17/2015  . HLD (hyperlipidemia)   . Hypertension   . Prediabetes 05/25/2019  . Uterine fibroid   . Varicose 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 08/26/2014    Past Surgical History:  Procedure Laterality Date  . ABDOMINAL HYSTERECTOMY    . CHOLECYSTECTOMY    . COLON SURGERY    . GALLBLADDER SURGERY    . MEDIASTERNOTOMY N/A 11/24/2017   Procedure: PARTIAL STERNOTOMY;  Surgeon: [name], MD;  Location: [place];  Service: Thoracic;  Laterality: N/A;  . THYMECTOMY  11/24/2017   Procedure: THYMECTOMY;  Surgeon: [name], MD;  Location: [place];  Service: Thoracic;;     reports that she has never smoked. She has never used smokeless tobacco. She reports that she does not drink alcohol and does not use drugs.  No Known Allergies  Family History  Problem Relation Age of Onset  . Heart attack Mother   . Hypertension Father     Prior to Admission medications   Medication Sig Start Date End Date Taking? Authorizing Provider  Evolocumab (REPATHA SURECLICK) 616 MG/ML SOAJ Inject 1 pen into the skin every 14 (fourteen) days. 01/11/20  Yes Dorothy Spark, MD  hydrALAZINE (APRESOLINE) 50 MG tablet Take 50 mg by mouth 3 (three) times daily.   Yes [provider]  lisinopril (ZESTRIL) 40 MG tablet TAKE 1 TABLET BY MOUTH EVERY DAY Patient taking differently: Take 40 mg by mouth daily.  05/27/19  Yes Copland, Gay Filler, MD  rivaroxaban (XARELTO) 20 MG TABS tablet TAKE 1 TABLET BY MOUTH DAILY WITH SUPPER Patient taking differently: Take 20 mg by mouth daily with breakfast.  05/26/19  Yes Copland, Gay Filler, MD    Physical Exam: Vitals:   03/21/20 1400 03/21/20 1500 03/21/20 1530 03/21/20 1705    BP: 135/70 (!) 151/84 135/72   Pulse: (!) 58 85 88 88  Resp: 18 (!) 22 (!) 22 20  Temp:  99 F (37.2 C)    TempSrc:  Oral    SpO2: 98% 97% 97% 99%  Weight:        Constitutional: NAD, calm, comfortable, on room air, communicating well Eyes: PERRL, lids and conjunctivae normal ENMT: Mucous membranes are moist. Posterior pharynx clear of any exudate or lesions.Normal dentition.  Neck: normal, supple, no masses, no thyromegaly Respiratory: clear to auscultation bilaterally, no wheezing, no crackles. Normal respiratory effort. No accessory muscle use.  Cardiovascular: Regular rate and rhythm, no murmurs / rubs / gallops. No extremity edema. 2+ pedal pulses. No carotid bruits.  Abdomen: no tenderness, no masses palpated. No hepatosplenomegaly. Bowel sounds positive.  Musculoskeletal: no clubbing / cyanosis. No joint deformity upper and lower extremities. Good ROM, no contractures. Normal muscle tone.  Skin: no rashes, lesions, ulcers. No induration Neurologic: CN 2-12 grossly intact. Sensation intact, DTR normal. Strength 5/5 in all 4.  Psychiatric: Normal judgment and insight. Alert and oriented x 3. Normal mood.    Labs on Admission: I have personally reviewed following labs and imaging studies  CBC: Recent Labs  Lab 03/21/20 0136  WBC 7.5  NEUTROABS 6.1  HGB 12.7  HCT 38.0  MCV 92.5  PLT 993   Basic Metabolic Panel: Recent Labs  Lab 03/21/20 0136  NA 139  K 3.5  CL 105  CO2 24  GLUCOSE 166*  BUN 14  CREATININE 0.75  CALCIUM 8.9   GFR: Estimated Creatinine Clearance: 78.2 mL/min (by C-G formula based on SCr of 0.75 mg/dL). Liver Function Tests: Recent Labs  Lab 03/15/20 1342 03/21/20 0136  AST 102* 33  ALT 131* 59*  ALKPHOS 271* 165*  BILITOT 0.7 0.3  PROT 6.5 6.6  ALBUMIN 4.2 4.0   Recent Labs  Lab 03/21/20 0139  LIPASE 39   No results for input(s): AMMONIA in the last 168 hours. Coagulation Profile: Recent Labs  Lab 03/21/20 0136  INR 1.0    Cardiac Enzymes: No results for input(s): CKTOTAL, CKMB, CKMBINDEX, TROPONINI in the last 168 hours. BNP (last 3 results) No results for input(s): PROBNP in the last 8760 hours. HbA1C: No results for input(s): HGBA1C in the last 72 hours. CBG: Recent Labs  Lab 03/20/20 2358  GLUCAP 192*   Lipid Profile: No results for input(s): CHOL, HDL, LDLCALC, TRIG, CHOLHDL, LDLDIRECT in the last 72 hours. Thyroid Function Tests: No results for input(s): TSH, T4TOTAL, FREET4, T3FREE, THYROIDAB in the last 72 hours. Anemia Panel: No results for input(s): VITAMINB12, FOLATE, FERRITIN, TIBC, IRON, RETICCTPCT in the last 72 hours. Urine analysis:    Component Value Date/Time   COLORURINE YELLOW 03/21/2020 0027   APPEARANCEUR CLEAR 03/21/2020 0027   LABSPEC 1.025 03/21/2020 0027   PHURINE 7.5 03/21/2020 0027   GLUCOSEU NEGATIVE 03/21/2020 0027   HGBUR NEGATIVE 03/21/2020 0027   BILIRUBINUR NEGATIVE 03/21/2020 0027   BILIRUBINUR negative 06/05/2012 1703   KETONESUR NEGATIVE 03/21/2020 0027   PROTEINUR NEGATIVE 03/21/2020 0027   UROBILINOGEN 0.2 06/05/2012 1703   UROBILINOGEN 0.2 07/14/2007 1019  NITRITE NEGATIVE 03/21/2020 0027   LEUKOCYTESUR NEGATIVE 03/21/2020 0027    Radiological Exams on Admission: CT ABDOMEN PELVIS W CONTRAST  Result Date: 03/21/2020 CLINICAL DATA:  Nausea vomiting EXAM: CT ABDOMEN AND PELVIS WITH CONTRAST TECHNIQUE: Multidetector CT imaging of the abdomen and pelvis was performed using the standard protocol following bolus administration of intravenous contrast. CONTRAST:  125mL OMNIPAQUE IOHEXOL 300 MG/ML  SOLN COMPARISON:  March 09, 2020 FINDINGS: Lower chest: The visualized heart size within normal limits. No pericardial fluid/thickening. A small hiatal hernia is present. Mild streaky atelectasis seen at both lung bases. Hepatobiliary: The liver is normal in density without focal abnormality.The main portal vein is patent. The patient is status post cholecystectomy.  No biliary ductal dilation. Pancreas: Unremarkable. No pancreatic ductal dilatation or surrounding inflammatory changes. Spleen: Normal in size without focal abnormality. Adrenals/Urinary Tract: Both adrenal glands appear normal. The kidneys and collecting system appear normal without evidence of urinary tract calculus or hydronephrosis. Bladder is unremarkable. Stomach/Bowel: The stomach, small bowel, and colon are normal in appearance. No inflammatory changes, wall thickening, or obstructive findings. Scattered colonic diverticula are noted. Surgical anastomosis seen at the sigmoid rectal junction. Moderate amount of colonic stool seen in the sigmoid rectal junction. The appendix is normal. Vascular/Lymphatic: There are no enlarged mesenteric, retroperitoneal, or pelvic lymph nodes. Again noted is stable misty mesentery within the mid abdomen with scattered small lymph nodes. Mild atherosclerosis seen at the aorta bi-iliac bifurcation. Reproductive: The patient is status post hysterectomy. No adnexal masses or collections seen. Other: No evidence of abdominal wall mass or hernia. Musculoskeletal: No acute or significant osseous findings. IMPRESSION: The stable findings suggestive of mesenteric panniculitis. Diverticulosis without diverticulitis. Aortic Atherosclerosis (ICD10-I70.0). Electronically Signed   By: Prudencio Pair M.D.   On: 03/21/2020 03:51   CT HEAD CODE STROKE WO CONTRAST  Result Date: 03/21/2020 CLINICAL DATA:  Code stroke. 64 year old female with sudden onset dizziness at 2233 hours. EXAM: CT HEAD WITHOUT CONTRAST TECHNIQUE: Contiguous axial images were obtained from the base of the skull through the vertex without intravenous contrast. COMPARISON:  Cervical spine MRI 10/15/2018. FINDINGS: Brain: Cerebral volume is within normal limits for age. No midline shift, ventriculomegaly, mass effect, evidence of mass lesion, intracranial hemorrhage or evidence of cortically based acute infarction. Mild  for age white matter hypodensity in the right at the right frontal operculum (series 3, image 20). Elsewhere gray-white matter differentiation is within normal limits throughout the brain. No cortical encephalomalacia. Vascular: No suspicious intracranial vascular hyperdensity. Skull: No acute osseous abnormality identified. Impacted left maxillary tooth (series 2, image 4). Sinuses/Orbits: Visualized paranasal sinuses and mastoids are clear. Tympanic cavities are clear. Other: Visualized orbits and scalp soft tissues are within normal limits. ASPECTS [facility] Stroke Program Early CT Score) Total score (0-10 with 10 being normal): 10 IMPRESSION: 1. No acute cortically based infarct or acute intracranial hemorrhage identified. ASPECTS 10. 2. Mild nonspecific white matter changes in the right frontal operculum. Study discussed by telephone with Dr. Laverta [place] in the ED on 03/21/2020 at 00:12 . Electronically Signed   By: Genevie Ann M.D.   On: 03/21/2020 00:14    EKG: Independently reviewed.  Normal sinus rhythm, prolonged QT interval.  No ST elevation or depression noted.  Assessment/Plan Principal Problem:   Ischemic stroke ([place]) Active Problems:   DVT (deep venous thrombosis) (HCC)   HTN (hypertension)   HLD (hyperlipidemia)   COVID-19 virus infection   Elevated liver enzymes   Dizziness with unsteady gait: -Sudden onset of  symptoms.  CT head negative for acute findings. -admit forTelemetry monitoring -Stroke protocol -Allow for permissive hypertension for the first 24-48h - only treat PRN if SBP >024 mmHg or diastolic blood pressure >097. Blood pressures can be gradually normalized to SBP<140 upon discharge. -Ordered MRI brain without contrast, MRA brain & neck, transthoracic echo, lipid panel, A1c -Frequent neuro checks -Consult Neurology-Dr. Curly [place] -PT/OT eval, Speech consult -Start on aspirin  COVID-19 virus infection detected: -Patient is asymptomatic, she is maintaining oxygen saturation on  room air, no wheezing noted on exam. -No indication of treatment at this time.  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  No new findings -Zofran as needed for nausea and vomiting  Elevated liver enzymes: Improving -Likely secondary to underlying COVID-19 infection -Continue to monitor  Hypertension: Blood pressure elevated -Hold hydralazine and lisinopril for now to allow permissive hypertension  History of left DVT: On 06/2017 - continue Xarelto  Hyperlipidemia: Check lipid panel -On Repatha.  She will be due on 03/25/2020  DVT prophylaxis: Xarelto/SCD Code Status: Full code Family Communication: None present at bedside.  Plan of care discussed with patient in length and he verbalized understanding and agreed with it. Disposition Plan: Home in 1 to 2 days Consults called: Neurology Admission status: Inpatient   Mckinley Jewel MD [facility] Pager [phone]  If 7PM-7AM, please contact night-coverage [email or web address] Password [facility]  03/21/2020, 5:08 PM

## 2020-03-21 NOTE — Consult Note (Signed)
[facility] Tele[facility]urology Consult Services   Date of Service:   03/21/2020 00:05:07  Impression:     .  R42 - Dizziness/ Vertigo/ Giddiness  Comments/Sign-Out: Patient with nausea and generalized weakness. She does not have any focal deficits on exam and is able to walk. There are some limitations with language barrier. Suspect this is more GI related but can not completely rule out stroke. Recommend further work up.  Metrics: Last Known Well: 03/20/2020 22:30:00 [facility] Notification Time: 03/21/2020 00:04:46 Arrival Time: 03/20/2020 23:46:00 Stamp Time: 03/21/2020 00:05:07 Time First Login Attempt: 03/21/2020 00:09:45 Symptoms: dizziness and weakness. NIHSS Start Assessment Time: 03/21/2020 00:13:18 Patient is not a candidate for Thrombolytic. Thrombolytic Medical Decision: 03/21/2020 00:16:46 Patient was not deemed candidate for Thrombolytic because of following reasons: Use of NOAs within 48 hours. No disabling symptoms.  CT head showed no acute hemorrhage or acute core infarct.  ED Physician notified of diagnostic impression and management plan on 03/21/2020 00:25:54  Advanced Imaging: Advanced Imaging Not Recommended because:  Clinical Presentation is not Suggestive of LVO and NIHSS is <6   Our recommendations are outlined below.  Recommendations:     .  Activate Stroke Protocol Admission/Order Set     .  Stroke/Telemetry Floor     .  Neuro Checks     .  Bedside Swallow Eval     .  DVT Prophylaxis     .  IV Fluids, Normal Saline     .  Head of Bed 30 Degrees     .  Euglycemia and Avoid Hyperthermia (PRN Acetaminophen)     .  Initiate Aspirin 325 MG Daily  Routine Consultation with [facility] for Follow up Care  Sign Out:     .  Discussed with Emergency Department Provider    ------------------------------------------------------------------------------  History of Present Illness: Patient is a 64 year old  Female.  Patient was brought by private transportation with symptoms of dizziness and weakness.  64 yo F with history of htn, hl, CVT on Xarelto with dizziness, nausea and difficulty with walking. Patient is feeling generally weak and complaining of abdominal pain along with the nausea. When she stands she saws she feels more weak then dizzy. Recent COVID infection.   Past Medical History:     . Hypertension  Anticoagulant use:  Xarelto     Examination: BP(192/79), Pulse(79), Blood Glucose(192) 1A: Level of Consciousness - Alert; keenly responsive + 0 1B: Ask Month and Age - Both Questions Right + 0 1C: Blink Eyes & Squeeze Hands - Performs Both Tasks + 0 2: Test Horizontal Extraocular Movements - Normal + 0 3: Test Visual Fields - No Visual Loss + 0 4: Test Facial Palsy (Use Grimace if Obtunded) - Normal symmetry + 0 5A: Test Left Arm Motor Drift - No Drift for 10 Seconds + 0 5B: Test Right Arm Motor Drift - No Drift for 10 Seconds + 0 6A: Test Left Leg Motor Drift - No Drift for 5 Seconds + 0 6B: Test Right Leg Motor Drift - No Drift for 5 Seconds + 0 7: Test Limb Ataxia (FNF/Heel-Shin) - No Ataxia + 0 8: Test Sensation - Normal; No sensory loss + 0 9: Test Language/Aphasia - Normal; No aphasia + 0 10: Test Dysarthria - Normal + 0 11: Test Extinction/Inattention - No abnormality + 0  NIHSS Score: 0  Pre-Morbid Modified Rankin Scale: 1 Points = No significant disability despite symptoms; able to carry out all usual duties and activities  Patient/Family was informed the Neurology Consult would occur via TeleHealth consult by way of interactive audio and video telecommunications and consented to receiving care in this manner.   Patient is being evaluated for possible acute neurologic impairment and high probability of imminent or life-threatening deterioration. I spent total of 30 minutes providing care to this patient, including time for face to face visit via telemedicine,  review of medical records, imaging studies and discussion of findings with providers, the patient and/or family.   Dr Carolin Sicks   [facility] [phone]  Case [id number]

## 2020-03-21 NOTE — Consult Note (Signed)
Referring Physician: Dr. Doristine Bosworth    Chief Complaint: Sudden onset of dizziness and difficulty ambulating.   HPI: [name] is an 64 y.o. [place] female with a PMHx of colon CA, DVT (on Xarelto), HTN, HLD, recent Covid-19 infection (positive test in late July) and prediabetes, who presented to OSH yesterday night with sudden onset of dizziness and difficulty walking, in conjunction with N/V. Reported some abdominal pain over the last week as well. She had no associated headache. Her son checked her blood pressure which was 180/90. LKN was 2230 on Monday night. After arrival to the OSH ED, Teleneurologist was consulted, who suspected that her presentation was more likely GI-related than due to stroke. CT head was negative for acute abnormality. She was deemed not to be a candidate for tPA. ASA 325 mg qd was initiated. She was transferred to [facility] for further work up.   LSN: 2230 on Monday. tPA Given: No: Most likely etiology for symptoms was felt to be GI-related.   Past Medical History:  Diagnosis Date  . Bilateral leg pain 07/02/2016  . Cancer ([place]) 08/03/2010   colon  . 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 History of colon cancer, stage I 08/17/2015  . HLD (hyperlipidemia)   . Hypertension   . Prediabetes 05/25/2019  . Uterine fibroid   . Varicose 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 08/26/2014    Past Surgical History:  Procedure Laterality Date  . ABDOMINAL HYSTERECTOMY    . CHOLECYSTECTOMY    . COLON SURGERY    . GALLBLADDER SURGERY    . MEDIASTERNOTOMY N/A 11/24/2017   Procedure: PARTIAL STERNOTOMY;  Surgeon: [name], MD;  Location: [place];  Service: Thoracic;  Laterality: N/A;  . THYMECTOMY  11/24/2017   Procedure: THYMECTOMY;  Surgeon: [name], MD;  Location: [facility] OR;  Service: Thoracic;;    Family History  Problem Relation Age of Onset  . Heart attack Mother   . Hypertension  Father    Social History:  reports that she has never smoked. She has never used smokeless tobacco. She reports that she does not drink alcohol and does not use drugs.  Allergies: No Known Allergies  Medications:  Prior to Admission:  Medications Prior to Admission  Medication Sig Dispense Refill Last Dose  . Evolocumab (REPATHA SURECLICK) 536 MG/ML SOAJ Inject 1 pen into the skin every 14 (fourteen) days. 2 pen 11 Past Month at Unknown time  . hydrALAZINE (APRESOLINE) 50 MG tablet Take 50 mg by mouth 3 (three) times daily.   03/20/2020 at Unknown time  . lisinopril (ZESTRIL) 40 MG tablet TAKE 1 TABLET BY MOUTH EVERY DAY (Patient taking differently: Take 40 mg by mouth daily. ) 90 tablet 3 03/20/2020 at Unknown time  . rivaroxaban (XARELTO) 20 MG TABS tablet TAKE 1 TABLET BY MOUTH DAILY WITH SUPPER (Patient taking differently: Take 20 mg by mouth daily with breakfast. ) 90 tablet 3 03/20/2020 at 1000   Scheduled: .  stroke: mapping our early stages of recovery book   Does not apply Once  . aspirin EC  81 mg Oral Daily  . [START ON 03/22/2020] rivaroxaban  20 mg Oral Q breakfast    ROS: As per HPI. Comprehensive ROS otherwise negative.   Physical Examination: Blood pressure (!) 163/85, pulse 65, temperature 98 F (36.7 C), temperature source Oral, resp. rate 18, weight 86.7 kg, SpO2 98 %.  HEENT: [place]/AT Lungs: Respirations unlabored Ext:  No edema  Neurologic Examination: Mental Status: Alert, fully oriented, thought content appropriate.  Speech fluent without evidence of aphasia.  Able to follow all commands without difficulty. Cranial Nerves: II:  Visual fields intact with no extinction to DSS. PERRL.  III,IV, VI: No ptosis. EOMI. No nystagmus.   V,VII: Smile symmetric. Facial temp sensation normal bilaterally VIII: Hearing intact to voice IX,X: No hypophonia XI: Symmetric shoulder shrug XII: Midline tongue extension  Motor: Right : Upper extremity   5/5    Left:     Upper  extremity   5/5  Lower extremity   5/5     Lower extremity   5/5 No pronator drift Sensory: Temp and light touch sensation intact in all 4 extremities. Deep Tendon Reflexes:  2+ bilateral brachioradialis, biceps and patellae Cerebellar: No ataxia with FNF bilaterally.  Gait: Able to stand with own power. Gait is narrow based with normal turns and stride length. No truncal ataxia noted.   Results for orders placed or performed during the hospital encounter of 03/21/20 (from the past 48 hour(s))  CBG monitoring, ED     Status: Abnormal   Collection Time: 03/20/20 11:58 PM  Result Value Ref Range   Glucose-Capillary 192 (H) 70 - 99 mg/dL    Comment: Glucose reference range applies only to samples taken after fasting for at least 8 hours.  Urine rapid drug screen (hosp performed)     Status: None   Collection Time: 03/21/20 12:27 AM  Result Value Ref Range   Opiates NONE DETECTED NONE DETECTED   Cocaine NONE DETECTED NONE DETECTED   Benzodiazepines NONE DETECTED NONE DETECTED   Amphetamines NONE DETECTED NONE DETECTED   Tetrahydrocannabinol NONE DETECTED NONE DETECTED   Barbiturates NONE DETECTED NONE DETECTED    Comment: (NOTE) DRUG SCREEN FOR MEDICAL PURPOSES ONLY.  IF CONFIRMATION IS NEEDED FOR ANY PURPOSE, NOTIFY LAB WITHIN 5 DAYS.  LOWEST DETECTABLE LIMITS FOR URINE DRUG SCREEN Drug Class                     Cutoff (ng/mL) Amphetamine and metabolites    1000 Barbiturate and metabolites    200 Benzodiazepine                 510 Tricyclics and metabolites     300 Opiates and metabolites        300 Cocaine and metabolites        300 THC                            50 Performed at [facility], [address] [place], [place] [address]   Urinalysis, Routine w reflex microscopic Urine, Clean Catch     Status: None   Collection Time: 03/21/20 12:27 AM  Result Value Ref Range   Color, Urine YELLOW YELLOW   APPearance CLEAR CLEAR   Specific Gravity, Urine 1.025  1.005 - 1.030   pH 7.5 5.0 - 8.0   Glucose, UA NEGATIVE NEGATIVE mg/dL   Hgb urine dipstick NEGATIVE NEGATIVE   Bilirubin Urine NEGATIVE NEGATIVE   Ketones, ur NEGATIVE NEGATIVE mg/dL   Protein, ur NEGATIVE NEGATIVE mg/dL   Nitrite NEGATIVE NEGATIVE   Leukocytes,Ua NEGATIVE NEGATIVE    Comment: Microscopic not done on urines with negative protein, blood, leukocytes, nitrite, or glucose < 500 mg/dL. Performed at [facility], [address] [place], [place] [address]   Protime-INR  Status: None   Collection Time: 03/21/20  1:36 AM  Result Value Ref Range   Prothrombin Time 12.4 11.4 - 15.2 seconds   INR 1.0 0.8 - 1.2    Comment: (NOTE) INR goal varies based on device and disease states. Performed at [facility], [address] [place], [place] [address]   APTT     Status: None   Collection Time: 03/21/20  1:36 AM  Result Value Ref Range   aPTT 28 24 - 36 seconds    Comment: Performed at [facility], [address] [place], [place] [address]  CBC     Status: None   Collection Time: 03/21/20  1:36 AM  Result Value Ref Range   WBC 7.5 4.0 - 10.5 K/uL   RBC 4.11 3.87 - 5.11 MIL/uL   Hemoglobin 12.7 12.0 - 15.0 g/dL   HCT 38.0 36 - 46 %   MCV 92.5 80.0 - 100.0 fL   MCH 30.9 26.0 - 34.0 pg   MCHC 33.4 30.0 - 36.0 g/dL   RDW 12.7 11.5 - 15.5 %   Platelets 202 150 - 400 K/uL   nRBC 0.0 0.0 - 0.2 %    Comment: Performed at [facility], [address] [place], [place] [address]  Differential     Status: None   Collection Time: 03/21/20  1:36 AM  Result Value Ref Range   Neutrophils Relative % 80 %   Neutro Abs 6.1 1.7 - 7.7 K/uL   Lymphocytes Relative 11 %   Lymphs Abs 0.9 0.7 - 4.0 K/uL   Monocytes Relative 7 %   Monocytes Absolute 0.5 0 - 1 K/uL   Eosinophils Relative 0 %   Eosinophils Absolute 0.0 0 - 0 K/uL   Basophils Relative 1 %   Basophils Absolute 0.1 0 - 0 K/uL   Immature Granulocytes 1 %   Abs Immature  Granulocytes 0.05 0.00 - 0.07 K/uL    Comment: Performed at [facility], [address] [place], [place] [address]  Comprehensive metabolic panel     Status: Abnormal   Collection Time: 03/21/20  1:36 AM  Result Value Ref Range   Sodium 139 135 - 145 mmol/L   Potassium 3.5 3.5 - 5.1 mmol/L   Chloride 105 98 - 111 mmol/L   CO2 24 22 - 32 mmol/L   Glucose, Bld 166 (H) 70 - 99 mg/dL    Comment: Glucose reference range applies only to samples taken after fasting for at least 8 hours.   BUN 14 8 - 23 mg/dL   Creatinine, Ser 0.75 0.44 - 1.00 mg/dL   Calcium 8.9 8.9 - 10.3 mg/dL   Total Protein 6.6 6.5 - 8.1 g/dL   Albumin 4.0 3.5 - 5.0 g/dL   AST 33 15 - 41 U/L   ALT 59 (H) 0 - 44 U/L   Alkaline Phosphatase 165 (H) 38 - 126 U/L   Total Bilirubin 0.3 0.3 - 1.2 mg/dL   GFR calc non Af Amer >60 >60 mL/min   GFR calc Af Amer >60 >60 mL/min   Anion gap 10 5 - 15    Comment: Performed at [facility], [address] [place], [place] [address]  Ethanol     Status: None   Collection Time: 03/21/20  1:38 AM  Result Value Ref Range   Alcohol, Ethyl (B) <10 <10 mg/dL    Comment: (NOTE) Lowest  detectable limit for serum alcohol is 10 mg/dL.  For medical purposes only. Performed at [facility], [address] [place], [place] [address]   Lipase, blood     Status: None   Collection Time: 03/21/20  1:39 AM  Result Value Ref Range   Lipase 39 11 - 51 U/L    Comment: Performed at [facility], [address] [place], [place] [address]  SARS Coronavirus 2 by RT PCR (hospital order, performed in [facility] hospital lab) Nasopharyngeal Nasopharyngeal Swab     Status: Abnormal   Collection Time: 03/21/20  9:16 AM   Specimen: Nasopharyngeal Swab  Result Value Ref Range   SARS Coronavirus 2 POSITIVE (A) NEGATIVE    Comment: RESULT CALLED TO, READ BACK BY AND VERIFIED WITH: CALLED TO M.SIMMS RN AT 1104 ON [id number] BY SROY (NOTE) SARS-CoV-2 target  nucleic acids are DETECTED  SARS-CoV-2 RNA is generally detectable in upper respiratory specimens  during the acute phase of infection.  Positive results are indicative  of the presence of the identified virus, but do not rule out bacterial infection or co-infection with other pathogens not detected by the test.  Clinical correlation with patient history and  other diagnostic information is necessary to determine patient infection status.  The expected result is negative.  Fact Sheet for Patients:   StrictlyIdeas.no   Fact Sheet for Healthcare Providers:   BankingDealers.co.za    This test is not yet approved or cleared by the [place] FDA and  has been authorized for detection and/or diagnosis of SARS-CoV-2 by FDA under an Emergency Use Authorization (EUA).  This EUA will remain in effect (mea ning this test can be used) for the duration of  the COVID-19 declaration under Section 564(b)(1) of the Act, 21 U.S.C. section 360-bbb-3(b)(1), unless the authorization is terminated or revoked sooner.  Performed at [facility], [address] [place], [place] [address]    MR ANGIO HEAD WO CONTRAST  Result Date: 03/21/2020 CLINICAL DATA:  Initial evaluation for acute dizziness, gait disturbance. EXAM: MRI HEAD WITHOUT CONTRAST MRA HEAD WITHOUT CONTRAST TECHNIQUE: Multiplanar, multiecho pulse sequences of the brain and surrounding structures were obtained without intravenous contrast. Angiographic images of the head were obtained using MRA technique without contrast. COMPARISON:  Prior head CT from earlier the same day. FINDINGS: MRI HEAD FINDINGS Brain: Cerebral volume within normal limits for age. Scattered patchy T2/FLAIR hyperintensity noted involving the periventricular and deep white matter both cerebral hemispheres. Note made of a mildly prominent 13 mm FLAIR hyperintensity involving the subcortical white matter of the right  frontal operculum (series 11, image 17). Findings are nonspecific, but most commonly related to chronic microvascular ischemic disease. Overall, appearance is mild for age. No abnormal foci of restricted diffusion to suggest acute or subacute ischemia. Gray-white matter differentiation maintained. No encephalomalacia to suggest chronic cortical infarction identified. No foci of susceptibility artifact to suggest acute or chronic intracranial hemorrhage. No mass lesion, midline shift or mass effect. Ventricles normal size without hydrocephalus. No extra-axial fluid collection. Pituitary gland suprasellar region within normal limits. Midline structures intact. Vascular: Major intracranial vascular flow voids are well maintained. Skull and upper cervical spine: Craniocervical junction within normal limits. Upper cervical spine within normal limits. Bone marrow signal intensity normal. No scalp soft tissue abnormality. Sinuses/Orbits: Globes and orbital soft tissues within normal limits. Mild scattered mucosal thickening noted within the ethmoidal air cells. Paranasal sinuses are otherwise clear. No mastoid effusion.  Inner ear structures grossly normal. Other: None. MRA HEAD FINDINGS ANTERIOR CIRCULATION: Visualized distal cervical segments of the internal carotid arteries are widely patent with symmetric antegrade flow. Petrous, cavernous, and supraclinoid ICAs widely patent without stenosis or other abnormality. A1 segments widely patent. Normal anterior communicating artery complex. Anterior cerebral arteries widely patent to their distal aspects without stenosis. No M1 stenosis or occlusion. Normal MCA bifurcations. Distal MCA branches well and. POSTERIOR CIRCULATION: Tubal arteries widely patent to the vertebrobasilar junction without stenosis. Left vertebral artery slightly dominant. Both picas patent. Basilar widely patent to its distal aspect without stenosis. Superior cerebral arteries patent bilaterally. Both  PCAs primarily supplied via the basilar well perfused to their distal aspects. No intracranial aneurysm or other vascular abnormality. IMPRESSION: MRI HEAD IMPRESSION: 1. No acute intracranial infarct or other abnormality. 2. Mild cerebral white matter disease involving the periventricular and deep white matter of both cerebral hemispheres as well as the right frontal operculum. Overall, changes are nonspecific, but most commonly related to chronic microvascular ischemic disease. Appearance is mild for age. MRA HEAD IMPRESSION: Normal intracranial MRA. No large vessel occlusion, hemodynamically significant stenosis, or other acute vascular abnormality. No aneurysm. Electronically Signed   By: Jeannine Boga M.D.   On: 03/21/2020 19:32   MR ANGIO NECK W WO CONTRAST  Result Date: 03/21/2020 CLINICAL DATA:  Neuro deficit EXAM: MRA NECK WITHOUT AND WITH CONTRAST TECHNIQUE: Multiplanar and multiecho pulse sequences of the neck were obtained without and with intravenous contrast. Angiographic images of the neck were obtained using MRA technique without and with intravenous contrast. CONTRAST:  8.64mL GADAVIST GADOBUTROL 1 MMOL/ML IV SOLN COMPARISON:  Concurrent MRA/MRI head. FINDINGS: There is no high-grade narrowing or focal aneurysm involving the bilateral carotid arteries. No evidence of dissection. The bilateral vertebral arteries are patent and demonstrate antegrade flow. Codominant vertebral arteries. No evidence of high-grade narrowing or focal aneurysm. Three vessel aortic arch.  Patent great vessel origins. IMPRESSION: No evidence of high-grade narrowing, dissection or aneurysm within the neck. Electronically Signed   By: Primitivo Gauze M.D.   On: 03/21/2020 18:56   MR BRAIN WO CONTRAST  Result Date: 03/21/2020 CLINICAL DATA:  Initial evaluation for acute dizziness, gait disturbance. EXAM: MRI HEAD WITHOUT CONTRAST MRA HEAD WITHOUT CONTRAST TECHNIQUE: Multiplanar, multiecho pulse sequences of  the brain and surrounding structures were obtained without intravenous contrast. Angiographic images of the head were obtained using MRA technique without contrast. COMPARISON:  Prior head CT from earlier the same day. FINDINGS: MRI HEAD FINDINGS Brain: Cerebral volume within normal limits for age. Scattered patchy T2/FLAIR hyperintensity noted involving the periventricular and deep white matter both cerebral hemispheres. Note made of a mildly prominent 13 mm FLAIR hyperintensity involving the subcortical white matter of the right frontal operculum (series 11, image 17). Findings are nonspecific, but most commonly related to chronic microvascular ischemic disease. Overall, appearance is mild for age. No abnormal foci of restricted diffusion to suggest acute or subacute ischemia. Gray-white matter differentiation maintained. No encephalomalacia to suggest chronic cortical infarction identified. No foci of susceptibility artifact to suggest acute or chronic intracranial hemorrhage. No mass lesion, midline shift or mass effect. Ventricles normal size without hydrocephalus. No extra-axial fluid collection. Pituitary gland suprasellar region within normal limits. Midline structures intact. Vascular: Major intracranial vascular flow voids are well maintained. Skull and upper cervical spine: Craniocervical junction within normal limits. Upper cervical spine within normal limits. Bone marrow signal intensity normal. No scalp soft tissue abnormality. Sinuses/Orbits: Globes and orbital soft tissues  within normal limits. Mild scattered mucosal thickening noted within the ethmoidal air cells. Paranasal sinuses are otherwise clear. No mastoid effusion. Inner ear structures grossly normal. Other: None. MRA HEAD FINDINGS ANTERIOR CIRCULATION: Visualized distal cervical segments of the internal carotid arteries are widely patent with symmetric antegrade flow. Petrous, cavernous, and supraclinoid ICAs widely patent without stenosis  or other abnormality. A1 segments widely patent. Normal anterior communicating artery complex. Anterior cerebral arteries widely patent to their distal aspects without stenosis. No M1 stenosis or occlusion. Normal MCA bifurcations. Distal MCA branches well and. POSTERIOR CIRCULATION: Tubal arteries widely patent to the vertebrobasilar junction without stenosis. Left vertebral artery slightly dominant. Both picas patent. Basilar widely patent to its distal aspect without stenosis. Superior cerebral arteries patent bilaterally. Both PCAs primarily supplied via the basilar well perfused to their distal aspects. No intracranial aneurysm or other vascular abnormality. IMPRESSION: MRI HEAD IMPRESSION: 1. No acute intracranial infarct or other abnormality. 2. Mild cerebral white matter disease involving the periventricular and deep white matter of both cerebral hemispheres as well as the right frontal operculum. Overall, changes are nonspecific, but most commonly related to chronic microvascular ischemic disease. Appearance is mild for age. MRA HEAD IMPRESSION: Normal intracranial MRA. No large vessel occlusion, hemodynamically significant stenosis, or other acute vascular abnormality. No aneurysm. Electronically Signed   By: Jeannine Boga M.D.   On: 03/21/2020 19:32   CT ABDOMEN PELVIS W CONTRAST  Result Date: 03/21/2020 CLINICAL DATA:  Nausea vomiting EXAM: CT ABDOMEN AND PELVIS WITH CONTRAST TECHNIQUE: Multidetector CT imaging of the abdomen and pelvis was performed using the standard protocol following bolus administration of intravenous contrast. CONTRAST:  137mL OMNIPAQUE IOHEXOL 300 MG/ML  SOLN COMPARISON:  March 09, 2020 FINDINGS: Lower chest: The visualized heart size within normal limits. No pericardial fluid/thickening. A small hiatal hernia is present. Mild streaky atelectasis seen at both lung bases. Hepatobiliary: The liver is normal in density without focal abnormality.The main portal vein is  patent. The patient is status post cholecystectomy. No biliary ductal dilation. Pancreas: Unremarkable. No pancreatic ductal dilatation or surrounding inflammatory changes. Spleen: Normal in size without focal abnormality. Adrenals/Urinary Tract: Both adrenal glands appear normal. The kidneys and collecting system appear normal without evidence of urinary tract calculus or hydronephrosis. Bladder is unremarkable. Stomach/Bowel: The stomach, small bowel, and colon are normal in appearance. No inflammatory changes, wall thickening, or obstructive findings. Scattered colonic diverticula are noted. Surgical anastomosis seen at the sigmoid rectal junction. Moderate amount of colonic stool seen in the sigmoid rectal junction. The appendix is normal. Vascular/Lymphatic: There are no enlarged mesenteric, retroperitoneal, or pelvic lymph nodes. Again noted is stable misty mesentery within the mid abdomen with scattered small lymph nodes. Mild atherosclerosis seen at the aorta bi-iliac bifurcation. Reproductive: The patient is status post hysterectomy. No adnexal masses or collections seen. Other: No evidence of abdominal wall mass or hernia. Musculoskeletal: No acute or significant osseous findings. IMPRESSION: The stable findings suggestive of mesenteric panniculitis. Diverticulosis without diverticulitis. Aortic Atherosclerosis (ICD10-I70.0). Electronically Signed   By: Prudencio Pair M.D.   On: 03/21/2020 03:51   CT HEAD CODE STROKE WO CONTRAST  Result Date: 03/21/2020 CLINICAL DATA:  Code stroke. 64 year old female with sudden onset dizziness at 2233 hours. EXAM: CT HEAD WITHOUT CONTRAST TECHNIQUE: Contiguous axial images were obtained from the base of the skull through the vertex without intravenous contrast. COMPARISON:  Cervical spine MRI 10/15/2018. FINDINGS: Brain: Cerebral volume is within normal limits for age. No midline shift, ventriculomegaly, mass effect, evidence  of mass lesion, intracranial hemorrhage or  evidence of cortically based acute infarction. Mild for age white matter hypodensity in the right at the right frontal operculum (series 3, image 20). Elsewhere gray-white matter differentiation is within normal limits throughout the brain. No cortical encephalomalacia. Vascular: No suspicious intracranial vascular hyperdensity. Skull: No acute osseous abnormality identified. Impacted left maxillary tooth (series 2, image 4). Sinuses/Orbits: Visualized paranasal sinuses and mastoids are clear. Tympanic cavities are clear. Other: Visualized orbits and scalp soft tissues are within normal limits. ASPECTS [facility] Stroke Program Early CT Score) Total score (0-10 with 10 being normal): 10 IMPRESSION: 1. No acute cortically based infarct or acute intracranial hemorrhage identified. ASPECTS 10. 2. Mild nonspecific white matter changes in the right frontal operculum. Study discussed by telephone with Dr. Laverta Baltimore in the ED on 03/21/2020 at 00:12 . Electronically Signed   By: Genevie Ann M.D.   On: 03/21/2020 00:14    Assessment: 64 y.o. female presenting with sudden onset of dizziness and unsteady gait 1. Exam reveals no neurological deficit.  2. CT head: No acute cortically based infarct or acute intracranial hemorrhage identified. ASPECTS 10. Mild nonspecific white matter changes in the right frontal operculum. 3. MRI brain: No acute intracranial infarct or other abnormality. Mild cerebral white matter disease involving the periventricular and deep white matter of both cerebral hemispheres as well as the right frontal operculum. Overall, changes are nonspecific, but most commonly related to chronic microvascular ischemic disease. Appearance is mild for age. 4. MRA head: Normal intracranial MRA. No large vessel occlusion, hemodynamically significant stenosis, or other acute vascular abnormality. No aneurysm. 5. MRA neck: No evidence of high-grade narrowing, dissection or aneurysm within the neck. 6. Stroke Risk Factors -   Cancer history, DVT, HTN, HLD, recent Covid-19 infection and prediabetes 7. Overall presentation is felt more likely to be GI-related than due to TIA. However, given that a cerebellar or brainstem TIA is possible, stroke work up should be completed.   Recommendations: 1. HgbA1c, fasting lipid panel 2. TTE 3. PT consult, OT consult, Speech consult 4. Has been started on ASA in addition to her Xarelto. There is no neurological indication for this combination. Would discontinue ASA. Continue Xarelto for her DVT.  5. Continue permissive HTN until 10:30 PM. Afterwards, blood pressures can be gradually normalized to SBP<140 upon discharge. 6. Risk factor modification 7. Telemetry monitoring 8. Frequent neuro checks   @Electronically  signed: Dr. Kerney Elbe 03/21/2020, 8:15 PM

## 2020-03-21 NOTE — ED Notes (Signed)
Pt endorses Covid+ on 7/20.

## 2020-03-21 NOTE — ED Provider Notes (Signed)
[facility] Provider Note   CSN: [id number] Arrival date & time: 03/20/20  2346  An emergency department physician performed an initial assessment on this suspected stroke patient at 0000.  History Chief Complaint  Patient presents with  . Code Stroke    [name] is a 64 y.o. female.  HPI     This is a 64 year old female with a history of colon cancer, DVT, hypertension, hyperlipidemia and recent history of COVID-19 infection who presents with dizziness, vomiting, gait disturbance.  I was called initially to triage for concerns for a code stroke.  There is a language barrier.  Unable to get a translator quickly but son states that she was last seen normal at 10:30 PM.  She had acute onset of lightheadedness and dizziness with some vomiting.  No headache.  Son noted difficulty with her walking and ambulating.  She also is reporting some abdominal pain but she has had some abdominal pain over the last week.  She was seen in the emergency room for this on 7/29 and had a CT scan that had some nonspecific adenopathy.  No diarrhea.  Recently tested Covid positive in late July.  Level 5 caveat for acuity of condition  Past Medical History:  Diagnosis Date  . Bilateral leg pain 07/02/2016  . Cancer ([place]) 08/03/2010   colon  . 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 History of colon cancer, stage I 08/17/2015  . HLD (hyperlipidemia)   . Hypertension   . Prediabetes 05/25/2019  . Uterine fibroid   . Varicose 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 08/26/2014    Patient Active Problem List   Diagnosis Date Noted  . 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 Prediabetes 05/25/2019  . 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 Bilateral leg pain 07/02/2016  . History of colon cancer, stage I 08/17/2015  . Varicose 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 08/26/2014  . HTN (hypertension) 12/24/2013  . Cancer of left colon  ([place]) 02/25/2012  . DVT (deep venous thrombosis) ([place]) 08/27/2011    Past Surgical History:  Procedure Laterality Date  . ABDOMINAL HYSTERECTOMY    . CHOLECYSTECTOMY    . COLON SURGERY    . GALLBLADDER SURGERY    . MEDIASTERNOTOMY N/A 11/24/2017   Procedure: PARTIAL STERNOTOMY;  Surgeon: [name], MD;  Location: [facility];  Service: Thoracic;  Laterality: N/A;  . THYMECTOMY  11/24/2017   Procedure: THYMECTOMY;  Surgeon: [name], MD;  Location: [facility];  Service: Thoracic;;     OB History   No obstetric history on file.     Family History  Problem Relation Age of Onset  . Heart attack Mother   . Hypertension Father     Social History   Tobacco Use  . Smoking status: Never Smoker  . Smokeless tobacco: Never Used  Vaping Use  . Vaping Use: Never used  Substance Use Topics  . Alcohol use: No    Alcohol/week: 0.0 standard drinks  . Drug use: No    Home Medications Prior to Admission medications   Medication Sig Start Date End Date Taking? Authorizing Provider  amLODipine (NORVASC) 10 MG tablet Take 1 tablet (10 mg total) by mouth daily. 05/26/19 05/25/20  Copland, Gay Filler, MD  Evolocumab (REPATHA SURECLICK) 631 MG/ML SOAJ Inject 1 pen into the skin every 14 (fourteen) days. 01/11/20   Dorothy Spark, MD  hydrALAZINE (APRESOLINE) 50 MG tablet  Take 1 tablet (50 mg total) by mouth 3 (three) times daily. 02/23/20 05/23/20  [name], PA-C  lisinopril (ZESTRIL) 40 MG tablet TAKE 1 TABLET BY MOUTH EVERY DAY 05/27/19   Copland, Gay Filler, MD  methocarbamol (ROBAXIN) 500 MG tablet Take 1 tablet (500 mg total) by mouth 2 (two) times daily. 03/04/20   Tedd Sias, PA  ondansetron (ZOFRAN) 4 MG tablet Take 1 tablet (4 mg total) by mouth every 8 (eight) hours as needed for up to 20 doses for nausea or vomiting. 03/09/20   Curatolo, Adam, DO  oxyCODONE-acetaminophen (PERCOCET/ROXICET) 5-325 MG tablet Take 1 tablet by mouth as needed.    [provider]  promethazine (PHENERGAN) 12.5 MG tablet Take 1 tablet (12.5 mg total) by mouth every 8 (eight) hours as needed for nausea or vomiting. Patient not taking: Reported on 03/15/2020 03/07/20   Saguier, Percell Miller, PA-C  rivaroxaban (XARELTO) 20 MG TABS tablet TAKE 1 TABLET BY MOUTH DAILY WITH SUPPER 05/26/19   Copland, Gay Filler, MD    Allergies    Patient has no known allergies.  Review of Systems   Review of Systems  Constitutional: Negative for fever.  Respiratory: Negative for shortness of breath.   Cardiovascular: Negative for chest pain.  Gastrointestinal: Positive for abdominal pain and nausea.  Genitourinary: Negative for dysuria.  Neurological: Positive for dizziness and weakness.  All other systems reviewed and are negative.   Physical Exam Updated Vital Signs BP (!) 177/92   Pulse 80   Temp 98.6 F (37 C)   Resp 17   Wt 86.7 kg   SpO2 99%   BMI 29.94 kg/m   Physical Exam Vitals and nursing note reviewed.  Constitutional:      Appearance: She is well-developed.     Comments: Ill-appearing but nontoxic  HENT:     Head: Normocephalic and atraumatic.     Mouth/Throat:     Mouth: Mucous membranes are dry.  Eyes:     Pupils: Pupils are equal, round, and reactive to light.  Cardiovascular:     Rate and Rhythm: Normal rate and regular rhythm.     Heart sounds: Normal heart sounds.  Pulmonary:     Effort: Pulmonary effort is normal. No respiratory distress.     Breath sounds: No wheezing.  Abdominal:     General: Bowel sounds are normal.     Palpations: Abdomen is soft.     Tenderness: There is abdominal tenderness. There is no guarding or rebound.  Musculoskeletal:     Cervical back: Neck supple.  Skin:    General: Skin is warm and dry.  Neurological:     Mental Status: She is alert and oriented to person, place, and time.     Comments: Cranial nerves II through XII intact, 5 out of 5 strength in all 4 extremities no dysmetria to  finger-nose-finger Exam limited  Psychiatric:        Mood and Affect: Mood normal.     ED Results / Procedures / Treatments   Labs (all labs ordered are listed, but only abnormal results are displayed) Labs Reviewed  COMPREHENSIVE METABOLIC PANEL - Abnormal; Notable for the following components:      Result Value   Glucose, Bld 166 (*)    ALT 59 (*)    Alkaline Phosphatase 165 (*)    All other components within normal limits  CBG MONITORING, ED - Abnormal; Notable for the following components:   Glucose-Capillary 192 (*)  All other components within normal limits  ETHANOL  PROTIME-INR  APTT  CBC  DIFFERENTIAL  RAPID URINE DRUG SCREEN, HOSP PERFORMED  URINALYSIS, ROUTINE W REFLEX MICROSCOPIC  LIPASE, BLOOD    EKG EKG Interpretation  Date/Time:  Monday March 20 2020 23:55:19 EDT Ventricular Rate:  85 PR Interval:  132 QRS Duration: 82 QT Interval:  406 QTC Calculation: 483 R Axis:   39 Text Interpretation: Normal sinus rhythm Nonspecific ST and T wave abnormality Prolonged QT Abnormal ECG No significant change since last tracing Confirmed by Thayer Jew [phone]) on 03/20/2020 11:59:58 PM   Radiology CT ABDOMEN PELVIS W CONTRAST  Result Date: 03/21/2020 CLINICAL DATA:  Nausea vomiting EXAM: CT ABDOMEN AND PELVIS WITH CONTRAST TECHNIQUE: Multidetector CT imaging of the abdomen and pelvis was performed using the standard protocol following bolus administration of intravenous contrast. CONTRAST:  154mL OMNIPAQUE IOHEXOL 300 MG/ML  SOLN COMPARISON:  March 09, 2020 FINDINGS: Lower chest: The visualized heart size within normal limits. No pericardial fluid/thickening. A small hiatal hernia is present. Mild streaky atelectasis seen at both lung bases. Hepatobiliary: The liver is normal in density without focal abnormality.The main portal vein is patent. The patient is status post cholecystectomy. No biliary ductal dilation. Pancreas: Unremarkable. No pancreatic ductal  dilatation or surrounding inflammatory changes. Spleen: Normal in size without focal abnormality. Adrenals/Urinary Tract: Both adrenal glands appear normal. The kidneys and collecting system appear normal without evidence of urinary tract calculus or hydronephrosis. Bladder is unremarkable. Stomach/Bowel: The stomach, small bowel, and colon are normal in appearance. No inflammatory changes, wall thickening, or obstructive findings. Scattered colonic diverticula are noted. Surgical anastomosis seen at the sigmoid rectal junction. Moderate amount of colonic stool seen in the sigmoid rectal junction. The appendix is normal. Vascular/Lymphatic: There are no enlarged mesenteric, retroperitoneal, or pelvic lymph nodes. Again noted is stable misty mesentery within the mid abdomen with scattered small lymph nodes. Mild atherosclerosis seen at the aorta bi-iliac bifurcation. Reproductive: The patient is status post hysterectomy. No adnexal masses or collections seen. Other: No evidence of abdominal wall mass or hernia. Musculoskeletal: No acute or significant osseous findings. IMPRESSION: The stable findings suggestive of mesenteric panniculitis. Diverticulosis without diverticulitis. Aortic Atherosclerosis (ICD10-I70.0). Electronically Signed   By: Prudencio Pair M.D.   On: 03/21/2020 03:51   CT HEAD CODE STROKE WO CONTRAST  Result Date: 03/21/2020 CLINICAL DATA:  Code stroke. 64 year old female with sudden onset dizziness at 2233 hours. EXAM: CT HEAD WITHOUT CONTRAST TECHNIQUE: Contiguous axial images were obtained from the base of the skull through the vertex without intravenous contrast. COMPARISON:  Cervical spine MRI 10/15/2018. FINDINGS: Brain: Cerebral volume is within normal limits for age. No midline shift, ventriculomegaly, mass effect, evidence of mass lesion, intracranial hemorrhage or evidence of cortically based acute infarction. Mild for age white matter hypodensity in the right at the right frontal  operculum (series 3, image 20). Elsewhere gray-white matter differentiation is within normal limits throughout the brain. No cortical encephalomalacia. Vascular: No suspicious intracranial vascular hyperdensity. Skull: No acute osseous abnormality identified. Impacted left maxillary tooth (series 2, image 4). Sinuses/Orbits: Visualized paranasal sinuses and mastoids are clear. Tympanic cavities are clear. Other: Visualized orbits and scalp soft tissues are within normal limits. ASPECTS [facility] Stroke Program Early CT Score) Total score (0-10 with 10 being normal): 10 IMPRESSION: 1. No acute cortically based infarct or acute intracranial hemorrhage identified. ASPECTS 10. 2. Mild nonspecific white matter changes in the right frontal operculum. Study discussed by telephone with Dr. Laverta Baltimore in  the ED on 03/21/2020 at 00:12 . Electronically Signed   By: Genevie Ann M.D.   On: 03/21/2020 00:14    Procedures Procedures (including critical care time)  CRITICAL CARE Performed by: Merryl Hacker   Total critical care time: 31 minutes  Critical care time was exclusive of separately billable procedures and treating other patients.  Critical care was necessary to treat or prevent imminent or life-threatening deterioration.  Critical care was time spent personally by me on the following activities: development of treatment plan with patient and/or surrogate as well as nursing, discussions with consultants, evaluation of patient's response to treatment, examination of patient, obtaining history from patient or surrogate, ordering and performing treatments and interventions, ordering and review of laboratory studies, ordering and review of radiographic studies, pulse oximetry and re-evaluation of patient's condition.   Medications Ordered in ED Medications  sodium chloride 0.9 % bolus 1,000 mL ( Intravenous Stopped 03/21/20 0350)  promethazine (PHENERGAN) injection 12.5 mg (12.5 mg Intravenous Given 03/21/20 0225)   iohexol (OMNIPAQUE) 300 MG/ML solution 100 mL (100 mLs Intravenous Contrast Given 03/21/20 0319)    ED Course  I have reviewed the triage vital signs and the nursing notes.  Pertinent labs & imaging results that were available during my care of the patient were reviewed by me and considered in my medical decision making (see chart for details).    MDM Rules/Calculators/A&P                           Patient presents with dizziness, nausea.  Recent history of abdominal pain and COVID-19 infection.  She is overall nontoxic.  She is acutely vomiting and nauseous.  She was activated as a code stroke from triage.  She is fairly difficult to assess given language barrier although she has no obvious focal deficit.  She was evaluated by teleneurology.  I discussed the case with the neurologist on-call.  She states that she feels that this is likely nonneurologic in origin; however, would recommend admission for full stroke work-up.  Patient is already on blood thinners and is not a candidate for TPA.  Work-up obtained and reviewed.  EKG without ischemic or arrhythmic changes.  CT head without acute bleed.  Lab work-up is largely reassuring without metabolic derangements, leukocytosis.  Abdominal labs are reassuring.  I reviewed her prior CT scan which showed mesenteric panniculitis.  I did repeat her scan given her profound nausea and vomiting.  This is stable.  We will plan for admission for stroke rule out.  Final Clinical Impression(s) / ED Diagnoses Final diagnoses:  Dizziness  Nausea    Rx / DC Orders ED Discharge Orders    None       Osinachi Navarrette, Barbette Hair, MD 03/21/20 [phone]

## 2020-03-21 NOTE — ED Triage Notes (Signed)
Pt brought in by son. Pt c/o sudden onset dizziness, difficulty walking, N/V. Pt's last known well is 22:30 this evening. EDP notified and pt cleared for CT per code stroke orders.   Pt has language barrier and speak [place].

## 2020-03-21 NOTE — Progress Notes (Signed)
P:t admitted to 3W02 at this time.  Alert and oriented.  [name] any pain or discomfort.  Tele box 8 was placed on patient; CCMD called and verified.  Bed alarm set and pt verbalizes understanding to call before getting out of bed.  Call bell within reach.  Pt does test positive for Covid but was initially diagnosed July 20th.  Per Dr. Baxter Flattery, no precautions are necessary.

## 2020-03-22 ENCOUNTER — Observation Stay (HOSPITAL_BASED_OUTPATIENT_CLINIC_OR_DEPARTMENT_OTHER): Payer: 59

## 2020-03-22 DIAGNOSIS — R748 Abnormal levels of other serum enzymes: Secondary | ICD-10-CM

## 2020-03-22 DIAGNOSIS — U071 COVID-19: Secondary | ICD-10-CM

## 2020-03-22 DIAGNOSIS — I10 Essential (primary) hypertension: Secondary | ICD-10-CM | POA: Diagnosis not present

## 2020-03-22 DIAGNOSIS — I825Z9 Chronic embolism and thrombosis of unspecified deep veins of unspecified distal lower extremity: Secondary | ICD-10-CM | POA: Diagnosis not present

## 2020-03-22 DIAGNOSIS — I6389 Other cerebral infarction: Secondary | ICD-10-CM

## 2020-03-22 LAB — COMPREHENSIVE METABOLIC PANEL
ALT: 45 U/L — ABNORMAL HIGH (ref 0–44)
AST: 23 U/L (ref 15–41)
Albumin: 3.5 g/dL (ref 3.5–5.0)
Alkaline Phosphatase: 141 U/L — ABNORMAL HIGH (ref 38–126)
Anion gap: 11 (ref 5–15)
BUN: 13 mg/dL (ref 8–23)
CO2: 24 mmol/L (ref 22–32)
Calcium: 9.1 mg/dL (ref 8.9–10.3)
Chloride: 108 mmol/L (ref 98–111)
Creatinine, Ser: 0.87 mg/dL (ref 0.44–1.00)
GFR calc Af Amer: >60 mL/min (ref >60)
GFR calc non Af Amer: >60 mL/min (ref >60)
Glucose, Bld: 116 mg/dL — ABNORMAL HIGH (ref 70–99)
Potassium: 3.6 mmol/L (ref 3.5–5.1)
Sodium: 143 mmol/L (ref 135–145)
Total Bilirubin: 1 mg/dL (ref 0.3–1.2)
Total Protein: 5.6 g/dL — ABNORMAL LOW (ref 6.5–8.1)

## 2020-03-22 LAB — CBC WITH DIFFERENTIAL/PLATELET
Abs Immature Granulocytes: 0.01 10*3/uL (ref 0.00–0.07)
Basophils Absolute: 0 10*3/uL (ref 0.0–0.1)
Basophils Relative: 1 %
Eosinophils Absolute: 0 10*3/uL (ref 0.0–0.5)
Eosinophils Relative: 1 %
HCT: 36.9 % (ref 36.0–46.0)
Hemoglobin: 12.1 g/dL (ref 12.0–15.0)
Immature Granulocytes: 0 %
Lymphocytes Relative: 36 %
Lymphs Abs: 1.5 10*3/uL (ref 0.7–4.0)
MCH: 30.8 pg (ref 26.0–34.0)
MCHC: 32.8 g/dL (ref 30.0–36.0)
MCV: 93.9 fL (ref 80.0–100.0)
Monocytes Absolute: 0.5 10*3/uL (ref 0.1–1.0)
Monocytes Relative: 12 %
Neutro Abs: 2.1 10*3/uL (ref 1.7–7.7)
Neutrophils Relative %: 50 %
Platelets: 197 10*3/uL (ref 150–400)
RBC: 3.93 MIL/uL (ref 3.87–5.11)
RDW: 13 % (ref 11.5–15.5)
WBC: 4.2 10*3/uL (ref 4.0–10.5)
nRBC: 0 % (ref 0.0–0.2)

## 2020-03-22 LAB — LIPID PANEL
Cholesterol: 165 mg/dL (ref 0–200)
HDL: 42 mg/dL (ref >40)
LDL Cholesterol: 97 mg/dL (ref 0–99)
Total CHOL/HDL Ratio: 3.9 RATIO
Triglycerides: 132 mg/dL (ref <150)
VLDL: 26 mg/dL (ref 0–40)

## 2020-03-22 LAB — HEMOGLOBIN A1C
Hgb A1c MFr Bld: 6.4 % — ABNORMAL HIGH (ref 4.8–5.6)
Mean Plasma Glucose: 136.98 mg/dL

## 2020-03-22 LAB — ECHOCARDIOGRAM COMPLETE
Area-P 1/2: 4.06 cm2
Calc EF: 57.9 %
S' Lateral: 3.7 cm
Single Plane A2C EF: 57.5 %
Single Plane A4C EF: 59.7 %
Weight: 3058.22 oz

## 2020-03-22 LAB — HIV ANTIBODY (ROUTINE TESTING W REFLEX): HIV Screen 4th Generation wRfx: NONREACTIVE

## 2020-03-22 MED ORDER — ATORVASTATIN CALCIUM 40 MG PO TABS
40.0000 mg | ORAL_TABLET | Freq: Every day | ORAL | Status: DC
  Administered 2020-03-22: 40 mg via ORAL
  Filled 2020-03-22: qty 1

## 2020-03-22 NOTE — Evaluation (Signed)
Occupational Therapy Evaluation Patient Details Name: [name] MRN: [id number] DOB: February 01, 1956 Today's Date: 03/22/2020    History of Present Illness Pt is a 64 y/o female with PMH of colon CA, DVT (on Xarelto), HTN, recent covid-10 infection (positive test in late July) and prediabetes, presenting with sudden onset of dizziness, difficulty walking, nausea and vomiting. 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    Clinical Impression   PTA patient independent and working. Admitted for above and presenting at baseline level of independence for ADLs, transfers and in room mobility.  Patient reports feeling back to normal and no deficits noted in strength, sensation, vision, cognition, or balance.  No further OT needs have been identified and OT will sign off. Thank you for this referral.     Follow Up Recommendations  No OT follow up    Equipment Recommendations  None recommended by OT    Recommendations for Other Services       Precautions / Restrictions Restrictions Weight Bearing Restrictions: No      Mobility Bed Mobility Overal bed mobility: Independent                Transfers Overall transfer level: Independent                    Balance Overall balance assessment: Independent                                         ADL either performed or assessed with clinical judgement   ADL Overall ADL's : Independent                                       General ADL Comments: patient demonstrates independent level for grooming, toilet transfers and in room mobility      Vision Baseline Vision/History: Wears glasses Wears Glasses: Reading only Patient Visual Report: No change from  baseline Vision Assessment?: No apparent visual deficits     Perception     Praxis      Pertinent Vitals/Pain Pain Assessment: No/denies pain     Hand Dominance Right   Extremity/Trunk Assessment Upper Extremity Assessment Upper Extremity Assessment: Overall WFL for tasks assessed   Lower Extremity Assessment Lower Extremity Assessment: Defer to PT evaluation   Cervical / Trunk Assessment Cervical / Trunk Assessment: Normal   Communication Communication Communication: No difficulties (English is not her first language, but no interpreter used )   Cognition Arousal/Alertness: Awake/alert Behavior During Therapy: WFL for tasks assessed/performed Overall Cognitive Status: Within Functional Limits for tasks assessed                                 General Comments: appears WFL for basic tasks, noted language barrier but pt has been declining interpreter    General Comments  patinet reports symptoms have resolved and she feels "back to normal"     Exercises     Shoulder Instructions      Home Living Family/patient expects to be discharged to:: Private residence Living Arrangements: Spouse/significant other;Children (son )  Type of Home: House Home Access: Stairs to enter [employer] of Steps: 5 Entrance Stairs-Rails: Right;Left Home Layout: One level     Bathroom Shower/Tub: Teacher, early years/pre: Standard     Home Equipment: None          Prior Functioning/Environment Level of Independence: Independent        Comments: driving, working full time         OT Problem List:        OT Treatment/Interventions:      OT Goals(Current goals can be found in the care plan section) Acute Rehab OT Goals Patient Stated Goal: to get home  OT Goal Formulation: With patient  OT Frequency:     Barriers to D/C:            Co-evaluation              AM-PAC OT "6 Clicks" Daily Activity     Outcome Measure Help  from another person eating meals?: None Help from another person taking care of personal grooming?: None Help from another person toileting, which includes using toliet, bedpan, or urinal?: None Help from another person bathing (including washing, rinsing, drying)?: None Help from another person to put on and taking off regular upper body clothing?: None Help from another person to put on and taking off regular lower body clothing?: None 6 Click Score: 24   End of Session Nurse Communication: Mobility status  Activity Tolerance: Patient tolerated treatment well Patient left: in bed;with call bell/phone within reach  OT Visit Diagnosis: Other abnormalities of gait and mobility (R26.89)                Time: 2035-5974 OT Time Calculation (min): 13 min Charges:  OT General Charges $OT Visit: 1 Visit OT Evaluation $OT Eval Low Complexity: 1 Low  Jolaine Artist, OT Acute Rehabilitation Services Pager [phone] Office [phone]    [name] 03/22/2020, 10:02 AM

## 2020-03-22 NOTE — Progress Notes (Signed)
SLP Cancellation Note  Patient Details Name: [name] MRN: [id number] DOB: September 05, 1955   Cancelled treatment:    Screened chart, reviewed PT/OT notes.  No new infarct per imaging.  No SLP eval warranted. Will sign off.  Denette Hass L. Tivis Ringer, [place] Office number [phone] Pager [phone]        Juan Quam Laurice 03/22/2020, 1:05 PM

## 2020-03-22 NOTE — TOC Transition Note (Signed)
Transition of Care [facility]) - CM/SW Discharge Note   Patient Details  Name: [name] MRN: [id number] Date of Birth: March 30, 1956  Transition of Care [facility]) CM/SW Contact:  Pollie Friar, RN Phone Number: 03/22/2020, 4:35 PM   Clinical Narrative:    Pt discharging home with self care. No f/u per PT/OT and no DME needs.  Pt has transportation home.    Final next level of care: Home/Self Care Barriers to Discharge: No Barriers Identified   Patient Goals and CMS Choice        Discharge Placement                       Discharge Plan and Services                                     Social Determinants of Health (SDOH) Interventions     Readmission Risk Interventions No flowsheet data found.

## 2020-03-22 NOTE — Progress Notes (Signed)
STROKE TEAM PROGRESS NOTE   SUBJECTIVE (INTERVAL HISTORY) No family is at the bedside.  Overall her condition is rapidly improving.  Patient lying in bed, no complaints.  She stated that Monday night she was having shower and felt unwell, she went to bed, had abdominal pain nausea vomiting.  She went to ER for evaluation, due to concern for dizziness, telemetry neurology consulted and sent to [facility] for further evaluation.  However, for dizziness, patient did not endorse any dizziness during my interview.  Currently, patient feels fine, nausea vomiting and abdominal pain resolved.  Stroke work-up all negative.   OBJECTIVE Temp:  [98 F (36.7 C)-98.4 F (36.9 C)] 98.2 F (36.8 C) (08/11 1613) Pulse Rate:  [59-73] 64 (08/11 1613) Cardiac Rhythm: Normal sinus rhythm (08/10 1948) Resp:  [16-18] 16 (08/11 1613) BP: (127-163)/(72-87) 151/87 (08/11 1613) SpO2:  [95 %-98 %] 98 % (08/11 1613)  Recent Labs  Lab 03/20/20 2358  GLUCAP 192*   Recent Labs  Lab 03/21/20 0136 03/22/20 0434  NA 139 143  K 3.5 3.6  CL 105 108  CO2 24 24  GLUCOSE 166* 116*  BUN 14 13  CREATININE 0.75 0.87  CALCIUM 8.9 9.1   Recent Labs  Lab 03/21/20 0136 03/22/20 0434  AST 33 23  ALT 59* 45*  ALKPHOS 165* 141*  BILITOT 0.3 1.0  PROT 6.6 5.6*  ALBUMIN 4.0 3.5   Recent Labs  Lab 03/21/20 0136 03/22/20 0434  WBC 7.5 4.2  NEUTROABS 6.1 2.1  HGB 12.7 12.1  HCT 38.0 36.9  MCV 92.5 93.9  PLT 202 197   No results for input(s): CKTOTAL, CKMB, CKMBINDEX, TROPONINI in the last 168 hours. Recent Labs    03/21/20 0136  LABPROT 12.4  INR 1.0   Recent Labs    03/21/20 0027  COLORURINE YELLOW  LABSPEC 1.025  PHURINE 7.5  GLUCOSEU NEGATIVE  HGBUR NEGATIVE  BILIRUBINUR NEGATIVE  KETONESUR NEGATIVE  PROTEINUR NEGATIVE  NITRITE NEGATIVE  LEUKOCYTESUR NEGATIVE       Component Value Date/Time   CHOL 165 03/22/2020 0434   TRIG 132 03/22/2020 0434   HDL 42 03/22/2020 0434   CHOLHDL 3.9 03/22/2020  0434   VLDL 26 03/22/2020 0434   LDLCALC 97 03/22/2020 0434   Lab Results  Component Value Date   HGBA1C 6.4 (H) 03/22/2020      Component Value Date/Time   LABOPIA NONE DETECTED 03/21/2020 0027   COCAINSCRNUR NONE DETECTED 03/21/2020 0027   LABBENZ NONE DETECTED 03/21/2020 0027   AMPHETMU NONE DETECTED 03/21/2020 0027   THCU NONE DETECTED 03/21/2020 0027   LABBARB NONE DETECTED 03/21/2020 0027    Recent Labs  Lab 03/21/20 0138  ETH <10    I have personally reviewed the radiological images below and agree with the radiology interpretations.  MR ANGIO HEAD WO CONTRAST  Result Date: 03/21/2020 CLINICAL DATA:  Initial evaluation for acute dizziness, gait disturbance. EXAM: MRI HEAD WITHOUT CONTRAST MRA HEAD WITHOUT CONTRAST TECHNIQUE: Multiplanar, multiecho pulse sequences of the brain and surrounding structures were obtained without intravenous contrast. Angiographic images of the head were obtained using MRA technique without contrast. COMPARISON:  Prior head CT from earlier the same day. FINDINGS: MRI HEAD FINDINGS Brain: Cerebral volume within normal limits for age. Scattered patchy T2/FLAIR hyperintensity noted involving the periventricular and deep white matter both cerebral hemispheres. Note made of a mildly prominent 13 mm FLAIR hyperintensity involving the subcortical white matter of the right frontal operculum (series 11, image 17). Findings are  nonspecific, but most commonly related to chronic microvascular ischemic disease. Overall, appearance is mild for age. No abnormal foci of restricted diffusion to suggest acute or subacute ischemia. Gray-white matter differentiation maintained. No encephalomalacia to suggest chronic cortical infarction identified. No foci of susceptibility artifact to suggest acute or chronic intracranial hemorrhage. No mass lesion, midline shift or mass effect. Ventricles normal size without hydrocephalus. No extra-axial fluid collection. Pituitary gland  suprasellar region within normal limits. Midline structures intact. Vascular: Major intracranial vascular flow voids are well maintained. Skull and upper cervical spine: Craniocervical junction within normal limits. Upper cervical spine within normal limits. Bone marrow signal intensity normal. No scalp soft tissue abnormality. Sinuses/Orbits: Globes and orbital soft tissues within normal limits. Mild scattered mucosal thickening noted within the ethmoidal air cells. Paranasal sinuses are otherwise clear. No mastoid effusion. Inner ear structures grossly normal. Other: None. MRA HEAD FINDINGS ANTERIOR CIRCULATION: Visualized distal cervical segments of the internal carotid arteries are widely patent with symmetric antegrade flow. Petrous, cavernous, and supraclinoid ICAs widely patent without stenosis or other abnormality. A1 segments widely patent. Normal anterior communicating artery complex. Anterior cerebral arteries widely patent to their distal aspects without stenosis. No M1 stenosis or occlusion. Normal MCA bifurcations. Distal MCA branches well and. POSTERIOR CIRCULATION: Tubal arteries widely patent to the vertebrobasilar junction without stenosis. Left vertebral artery slightly dominant. Both picas patent. Basilar widely patent to its distal aspect without stenosis. Superior cerebral arteries patent bilaterally. Both PCAs primarily supplied via the basilar well perfused to their distal aspects. No intracranial aneurysm or other vascular abnormality. IMPRESSION: MRI HEAD IMPRESSION: 1. No acute intracranial infarct or other abnormality. 2. Mild cerebral white matter disease involving the periventricular and deep white matter of both cerebral hemispheres as well as the right frontal operculum. Overall, changes are nonspecific, but most commonly related to chronic microvascular ischemic disease. Appearance is mild for age. MRA HEAD IMPRESSION: Normal intracranial MRA. No large vessel occlusion,  hemodynamically significant stenosis, or other acute vascular abnormality. No aneurysm. Electronically Signed   By: Jeannine Boga M.D.   On: 03/21/2020 19:32   MR ANGIO NECK W WO CONTRAST  Result Date: 03/21/2020 CLINICAL DATA:  Neuro deficit EXAM: MRA NECK WITHOUT AND WITH CONTRAST TECHNIQUE: Multiplanar and multiecho pulse sequences of the neck were obtained without and with intravenous contrast. Angiographic images of the neck were obtained using MRA technique without and with intravenous contrast. CONTRAST:  8.58mL GADAVIST GADOBUTROL 1 MMOL/ML IV SOLN COMPARISON:  Concurrent MRA/MRI head. FINDINGS: There is no high-grade narrowing or focal aneurysm involving the bilateral carotid arteries. No evidence of dissection. The bilateral vertebral arteries are patent and demonstrate antegrade flow. Codominant vertebral arteries. No evidence of high-grade narrowing or focal aneurysm. Three vessel aortic arch.  Patent great vessel origins. IMPRESSION: No evidence of high-grade narrowing, dissection or aneurysm within the neck. Electronically Signed   By: Primitivo Gauze M.D.   On: 03/21/2020 18:56   MR BRAIN WO CONTRAST  Result Date: 03/21/2020 CLINICAL DATA:  Initial evaluation for acute dizziness, gait disturbance. EXAM: MRI HEAD WITHOUT CONTRAST MRA HEAD WITHOUT CONTRAST TECHNIQUE: Multiplanar, multiecho pulse sequences of the brain and surrounding structures were obtained without intravenous contrast. Angiographic images of the head were obtained using MRA technique without contrast. COMPARISON:  Prior head CT from earlier the same day. FINDINGS: MRI HEAD FINDINGS Brain: Cerebral volume within normal limits for age. Scattered patchy T2/FLAIR hyperintensity noted involving the periventricular and deep white matter both cerebral hemispheres. Note made of a mildly  prominent 13 mm FLAIR hyperintensity involving the subcortical white matter of the right frontal operculum (series 11, image 17). Findings  are nonspecific, but most commonly related to chronic microvascular ischemic disease. Overall, appearance is mild for age. No abnormal foci of restricted diffusion to suggest acute or subacute ischemia. Gray-white matter differentiation maintained. No encephalomalacia to suggest chronic cortical infarction identified. No foci of susceptibility artifact to suggest acute or chronic intracranial hemorrhage. No mass lesion, midline shift or mass effect. Ventricles normal size without hydrocephalus. No extra-axial fluid collection. Pituitary gland suprasellar region within normal limits. Midline structures intact. Vascular: Major intracranial vascular flow voids are well maintained. Skull and upper cervical spine: Craniocervical junction within normal limits. Upper cervical spine within normal limits. Bone marrow signal intensity normal. No scalp soft tissue abnormality. Sinuses/Orbits: Globes and orbital soft tissues within normal limits. Mild scattered mucosal thickening noted within the ethmoidal air cells. Paranasal sinuses are otherwise clear. No mastoid effusion. Inner ear structures grossly normal. Other: None. MRA HEAD FINDINGS ANTERIOR CIRCULATION: Visualized distal cervical segments of the internal carotid arteries are widely patent with symmetric antegrade flow. Petrous, cavernous, and supraclinoid ICAs widely patent without stenosis or other abnormality. A1 segments widely patent. Normal anterior communicating artery complex. Anterior cerebral arteries widely patent to their distal aspects without stenosis. No M1 stenosis or occlusion. Normal MCA bifurcations. Distal MCA branches well and. POSTERIOR CIRCULATION: Tubal arteries widely patent to the vertebrobasilar junction without stenosis. Left vertebral artery slightly dominant. Both picas patent. Basilar widely patent to its distal aspect without stenosis. Superior cerebral arteries patent bilaterally. Both PCAs primarily supplied via the basilar well  perfused to their distal aspects. No intracranial aneurysm or other vascular abnormality. IMPRESSION: MRI HEAD IMPRESSION: 1. No acute intracranial infarct or other abnormality. 2. Mild cerebral white matter disease involving the periventricular and deep white matter of both cerebral hemispheres as well as the right frontal operculum. Overall, changes are nonspecific, but most commonly related to chronic microvascular ischemic disease. Appearance is mild for age. MRA HEAD IMPRESSION: Normal intracranial MRA. No large vessel occlusion, hemodynamically significant stenosis, or other acute vascular abnormality. No aneurysm. Electronically Signed   By: Jeannine Boga M.D.   On: 03/21/2020 19:32   CT ABDOMEN PELVIS W CONTRAST  Result Date: 03/21/2020 CLINICAL DATA:  Nausea vomiting EXAM: CT ABDOMEN AND PELVIS WITH CONTRAST TECHNIQUE: Multidetector CT imaging of the abdomen and pelvis was performed using the standard protocol following bolus administration of intravenous contrast. CONTRAST:  158mL OMNIPAQUE IOHEXOL 300 MG/ML  SOLN COMPARISON:  March 09, 2020 FINDINGS: Lower chest: The visualized heart size within normal limits. No pericardial fluid/thickening. A small hiatal hernia is present. Mild streaky atelectasis seen at both lung bases. Hepatobiliary: The liver is normal in density without focal abnormality.The main portal vein is patent. The patient is status post cholecystectomy. No biliary ductal dilation. Pancreas: Unremarkable. No pancreatic ductal dilatation or surrounding inflammatory changes. Spleen: Normal in size without focal abnormality. Adrenals/Urinary Tract: Both adrenal glands appear normal. The kidneys and collecting system appear normal without evidence of urinary tract calculus or hydronephrosis. Bladder is unremarkable. Stomach/Bowel: The stomach, small bowel, and colon are normal in appearance. No inflammatory changes, wall thickening, or obstructive findings. Scattered colonic  diverticula are noted. Surgical anastomosis seen at the sigmoid rectal junction. Moderate amount of colonic stool seen in the sigmoid rectal junction. The appendix is normal. Vascular/Lymphatic: There are no enlarged mesenteric, retroperitoneal, or pelvic lymph nodes. Again noted is stable misty mesentery within the mid abdomen  with scattered small lymph nodes. Mild atherosclerosis seen at the aorta bi-iliac bifurcation. Reproductive: The patient is status post hysterectomy. No adnexal masses or collections seen. Other: No evidence of abdominal wall mass or hernia. Musculoskeletal: No acute or significant osseous findings. IMPRESSION: The stable findings suggestive of mesenteric panniculitis. Diverticulosis without diverticulitis. Aortic Atherosclerosis (ICD10-I70.0). Electronically Signed   By: Prudencio Pair M.D.   On: 03/21/2020 03:51   CT ABDOMEN PELVIS W CONTRAST  Result Date: 03/09/2020 CLINICAL DATA:  Abdominal distension and pain for 2 days. Patient is COVID-19 positive. EXAM: CT ABDOMEN AND PELVIS WITH CONTRAST TECHNIQUE: Multidetector CT imaging of the abdomen and pelvis was performed using the standard protocol following bolus administration of intravenous contrast. CONTRAST:  171mL OMNIPAQUE IOHEXOL 300 MG/ML  SOLN COMPARISON:  CT chest, abdomen pelvis dated 03/10/2013. FINDINGS: Lower chest: Peripheral predominant airspace opacities are partially imaged. Hepatobiliary: No focal liver abnormality is seen. Status post cholecystectomy. No biliary dilatation. Pancreas: Unremarkable. No pancreatic ductal dilatation or surrounding inflammatory changes. Spleen: Normal in size without focal abnormality. Adrenals/Urinary Tract: Adrenal glands are unremarkable. Kidneys are normal, without renal calculi, focal lesion, or hydronephrosis. Bladder is unremarkable. Stomach/Bowel: Stomach is within normal limits. Appendix appears normal. No evidence of bowel wall thickening, distention, or inflammatory changes. A  surgical anastomosis is seen in the pelvis. Vascular/Lymphatic: Aortic atherosclerosis. There are prominent mesenteric lymph nodes with associated moderate mesenteric fat stranding. No enlarged pelvic lymph nodes. Reproductive: Status post hysterectomy. No adnexal masses. Other: No abdominal wall hernia or abnormality. No abdominopelvic ascites. Musculoskeletal: No acute or significant osseous findings. IMPRESSION: 1. 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 2. Peripheral predominant airspace opacities are partially imaged and likely represent COVID-19 pneumonia. Aortic Atherosclerosis (ICD10-I70.0). Electronically Signed   By: Zerita Boers M.D.   On: 03/09/2020 22:31   ECHOCARDIOGRAM COMPLETE  Result Date: 03/22/2020    ECHOCARDIOGRAM REPORT   Patient Name:   Savera Bontempo Date of Exam: 03/22/2020 Medical Rec #:  [id number]         Height:       67.0 in Accession #:    [id number]        Weight:       191.1 lb Date of Birth:  April 25, 1956         BSA:          1.984 m Patient Age:    64 years          BP:           127/76 mmHg Patient Gender: F                 HR:           57 bpm. Exam Location:  Inpatient Procedure: 2D Echo, Cardiac Doppler and Color Doppler Indications:    Stroke 434.91  History:        Patient has no prior history of Echocardiogram examinations.                 Risk Factors:Hypertension, Dyslipidemia and Non-Smoker.  Sonographer:    Vickie Epley RDCS Referring Phys: [id number] [place]  1. Left ventricular ejection fraction, by estimation, is 55 to 60%. The left ventricle has normal function. The left ventricle has no regional wall motion abnormalities. Left ventricular diastolic parameters are consistent with Grade I diastolic dysfunction (impaired relaxation).  2. Right ventricular systolic function is normal. The right ventricular size is normal. Tricuspid regurgitation signal  is inadequate  for assessing PA pressure.  3. The mitral valve is normal in structure. No evidence of mitral valve regurgitation. No evidence of mitral stenosis.  4. The aortic valve is tricuspid. Aortic valve regurgitation is not visualized. No aortic stenosis is present.  5. The inferior vena cava is normal in size with greater than 50% respiratory variability, suggesting right atrial pressure of 3 mmHg. FINDINGS  Left Ventricle: Left ventricular ejection fraction, by estimation, is 55 to 60%. The left ventricle has normal function. The left ventricle has no regional wall motion abnormalities. The left ventricular internal cavity size was normal in size. There is  no left ventricular hypertrophy. Left ventricular diastolic parameters are consistent with Grade I diastolic dysfunction (impaired relaxation). Right Ventricle: The right ventricular size is normal. No increase in right ventricular wall thickness. Right ventricular systolic function is normal. Tricuspid regurgitation signal is inadequate for assessing PA pressure. Left Atrium: Left atrial size was normal in size. Right Atrium: Right atrial size was normal in size. Pericardium: There is no evidence of pericardial effusion. Mitral Valve: The mitral valve is normal in structure. No evidence of mitral valve regurgitation. No evidence of mitral valve stenosis. Tricuspid Valve: The tricuspid valve is normal in structure. Tricuspid valve regurgitation is not demonstrated. Aortic Valve: The aortic valve is tricuspid. Aortic valve regurgitation is not visualized. No aortic stenosis is present. Pulmonic Valve: The pulmonic valve was normal in structure. Pulmonic valve regurgitation is not visualized. Aorta: The aortic root is normal in size and structure. Venous: The inferior vena cava is normal in size with greater than 50% respiratory variability, suggesting right atrial pressure of 3 mmHg. IAS/Shunts: No atrial level shunt detected by color flow Doppler.  LEFT VENTRICLE PLAX  2D LVIDd:         4.60 cm      Diastology LVIDs:         3.70 cm      LV e' lateral:   6.66 cm/s LV PW:         0.80 cm      LV E/e' lateral: 11.4 LV IVS:        0.80 cm      LV e' medial:    5.63 cm/s LVOT diam:     2.10 cm      LV E/e' medial:  13.5 LV SV:         70 LV SV Index:   35 LVOT Area:     3.46 cm  LV Volumes (MOD) LV vol d, MOD A2C: 110.0 ml LV vol d, MOD A4C: 110.0 ml LV vol s, MOD A2C: 46.7 ml LV vol s, MOD A4C: 44.3 ml LV SV MOD A2C:     63.3 ml LV SV MOD A4C:     110.0 ml LV SV MOD BP:      64.5 ml RIGHT VENTRICLE RV S prime:     11.50 cm/s TAPSE (M-mode): 1.9 cm LEFT ATRIUM             Index       RIGHT ATRIUM          Index LA diam:        3.70 cm 1.87 cm/m  RA Area:     9.16 cm LA Vol (A2C):   28.4 ml 14.32 ml/m RA Volume:   16.50 ml 8.32 ml/m LA Vol (A4C):   25.3 ml 12.75 ml/m LA Biplane Vol: 29.1 ml 14.67 ml/m  AORTIC VALVE LVOT Vmax:  90.10 cm/s LVOT Vmean:  61.000 cm/s LVOT VTI:    0.201 m  AORTA Ao Root diam: 3.20 cm MITRAL VALVE MV Area (PHT): 4.06 cm    SHUNTS MV Decel Time: 187 msec    Systemic VTI:  0.20 m MV E velocity: 75.80 cm/s  Systemic Diam: 2.10 cm MV A velocity: 75.80 cm/s MV E/A ratio:  1.00 Loralie Champagne MD Electronically signed by Loralie Champagne MD Signature Date/Time: 03/22/2020/4:51:53 PM    Final    CT HEAD CODE STROKE WO CONTRAST  Result Date: 03/21/2020 CLINICAL DATA:  Code stroke. 64 year old female with sudden onset dizziness at 2233 hours. EXAM: CT HEAD WITHOUT CONTRAST TECHNIQUE: Contiguous axial images were obtained from the base of the skull through the vertex without intravenous contrast. COMPARISON:  Cervical spine MRI 10/15/2018. FINDINGS: Brain: Cerebral volume is within normal limits for age. No midline shift, ventriculomegaly, mass effect, evidence of mass lesion, intracranial hemorrhage or evidence of cortically based acute infarction. Mild for age white matter hypodensity in the right at the right frontal operculum (series 3, image 20). Elsewhere  gray-white matter differentiation is within normal limits throughout the brain. No cortical encephalomalacia. Vascular: No suspicious intracranial vascular hyperdensity. Skull: No acute osseous abnormality identified. Impacted left maxillary tooth (series 2, image 4). Sinuses/Orbits: Visualized paranasal sinuses and mastoids are clear. Tympanic cavities are clear. Other: Visualized orbits and scalp soft tissues are within normal limits. ASPECTS [facility] Stroke Program Early CT Score) Total score (0-10 with 10 being normal): 10 IMPRESSION: 1. No acute cortically based infarct or acute intracranial hemorrhage identified. ASPECTS 10. 2. Mild nonspecific white matter changes in the right frontal operculum. Study discussed by telephone with Dr. Laverta Baltimore in the ED on 03/21/2020 at 00:12 . Electronically Signed   By: Genevie Ann M.D.   On: 03/21/2020 00:14    PHYSICAL EXAM  Temp:  [98 F (36.7 C)-98.4 F (36.9 C)] 98.2 F (36.8 C) (08/11 1613) Pulse Rate:  [59-73] 64 (08/11 1613) Resp:  [16-18] 16 (08/11 1613) BP: (127-163)/(72-87) 151/87 (08/11 1613) SpO2:  [95 %-98 %] 98 % (08/11 1613)  General - Well nourished, well developed, in no apparent distress.  Ophthalmologic - fundi not visualized due to noncooperation.  Cardiovascular - Regular rhythm and rate.  Mental Status -  Level of arousal and orientation to time, place, and person were intact. Language including expression, naming, repetition, comprehension was assessed and found intact. Fund of Knowledge was assessed and was intact.  Cranial Nerves II - XII - II - Visual field intact OU. III, IV, VI - Extraocular movements intact. V - Facial sensation intact bilaterally. VII - Facial movement intact bilaterally. VIII - Hearing & vestibular intact bilaterally. X - Palate elevates symmetrically. XI - Chin turning & shoulder shrug intact bilaterally. XII - Tongue protrusion intact.  Motor Strength - The patient's strength was normal in all  extremities and pronator drift was absent.  Bulk was normal and fasciculations were absent.   Motor Tone - Muscle tone was assessed at the neck and appendages and was normal.  Reflexes - The patient's reflexes were symmetrical in all extremities and she had no pathological reflexes.  Sensory - Light touch, temperature/pinprick were assessed and were symmetrical.    Coordination - The patient had normal movements in the hands and feet with no ataxia or dysmetria.  Tremor was absent.  Gait and Station - deferred.   ASSESSMENT/PLAN Ms. Rubbie Goostree is a 64 y.o. female with history of colon cancer, DVT on  Xarelto, hypertension hyperlipidemia, Covid in 02/2020 admitted for generalized weakness, nausea vomiting, abdominal pain and?  Dizziness. No tPA given due to likely not stroke.    ?  Gastritis versus hypertensive encephalopathy  Presented with nausea vomiting abdominal pain  Checked BP at the time 180/90-patient feels very high for her  MRI negative for stroke  MRA head and neck unremarkable  2D Echo EF 55 to 60%  LDL 97  HgbA1c 6.4  Xarelto for VTE prophylaxis  Xarelto (rivaroxaban) daily prior to admission, now on Xarelto (rivaroxaban) daily.  Continue on discharge  Therapy recommendations: None  Disposition: Home  Hypertension . BP 180/90 at time of episode-patient feels very high for her . Stable now . Resume home medication on discharge  Long term BP goal normotensive  Hyperlipidemia  Home meds: Repatha injection  LDL 97, goal < 70  Patient stated that she has a lot of muscle pain on statin  Continue Repatha at discharge  Recent Covid infection  Covid test + 02/2020  Currently asymptomatic  Admission Covid test still positive  Other Stroke Risk Factors  Advanced age  History of DVT on Xarelto  Other Active Problems  Colon cancer in admission  Hospital day # 0  Neurology will sign off. Please call with questions. Thanks for the  consult.   Rosalin Hawking, MD PhD Stroke Neurology 03/22/2020 5:31 PM    To contact Stroke Continuity provider, please refer to [email or web address] After hours, contact General Neurology

## 2020-03-22 NOTE — Progress Notes (Signed)
Discharged to home after IV access removed.  Discharge instructions reviewed with.  No complaints

## 2020-03-22 NOTE — Discharge Summary (Signed)
Physician Discharge Summary  [name] NGE:[id number] DOB: 07/01/1956 DOA: 03/21/2020  PCP: Darreld Mclean, MD  Admit date: 03/21/2020 Discharge date: 03/22/2020  Admitted From: Home  Disposition:  Home   Recommendations for Outpatient Follow-up and new medication changes:  1. Follow up with Dr. Lorelei Pont in 14 days.  2. Patient tested positive for COVID 19 in July 20 3. Pending echocardiogram report.   Home Health: no   Equipment/Devices: no    Discharge Condition: stable  CODE STATUS: full  Diet recommendation: heart healthy   Brief/Interim Summary: The patient was admitted to the hospital with the working diagnosis of focal neurologic deficit ruled out for acute CVA.   64 year old female past medical history significant for hypertension, dyslipidemia, colon cancer, prediabetes and deep vein thrombosis.  She presented with sudden onset of dizziness, that resulted in ambulatory dysfunction and vomiting (x7 to 8 times).  On her initial physical examination blood pressure 135/70, heart rate 58, respiratory 22, temperature 99, oxygen saturation 97%.  She had moist mucous membranes, lungs clear to auscultation bilaterally, heart S1-S2, present rhythmic, soft abdomen, no lower extremity edema, neurologically patient was nonfocal. Sodium 139, potassium 3.5, chloride 105, bicarb 24, glucose 166, BUN 14, creatinine 0.75, white count 7.5, hemoglobin 12.7, hematocrit 38.0, platelets 202.  SARS COVID-19 is positive.  Urinalysis negative for infection.  CT of the abdomen with stable findings of mesenteric panniculitis.  Faint groundglass infiltrates bilaterally at bases.  EKG 85 bpm, normal axis, normal intervals, sinus rhythm, no ST segment elevation or T wave abnormalities.   1.  Focal neurologic deficit, dizziness, ruled out for acute CVA.  Patient was admitted to the medical ward she had extensive work-up including MRI of the brain head CT and echocardiography.  She was consulted by  neurology, CVA has ruled out. Patient had recent COVID-19 infection, it is possible that she may still be recovering from her acute viral illness.  2.  Recent SARS COVID-19 viral infection.  Initial diagnosis July 20, she reported recovering well from acute viral illness.  She did not had fever in the acute phase.  Apparently her symptoms did resolve completely.  Considering timeframe she should not be infectious anymore.  Her symptoms might be related to recovering from viral illness.  3.  Mesenteric panniculitis.  Chronic findings.  4.  Hypertension.  Continue blood pressure control with lisinopril and hydralazine   5.  History of deep vein thrombosis.  Rivaroxaban.  6.  Dyslipidemia.  Continue repatha.   Discharge Diagnoses:  Active Problems:   DVT (deep venous thrombosis) (HCC)   HTN (hypertension)   HLD (hyperlipidemia)   COVID-19 virus infection   Elevated liver enzymes    Discharge Instructions   Allergies as of 03/22/2020   No Known Allergies     Medication List    TAKE these medications   hydrALAZINE 50 MG tablet Commonly known as: APRESOLINE Take 50 mg by mouth 3 (three) times daily.   lisinopril 40 MG tablet Commonly known as: ZESTRIL TAKE 1 TABLET BY MOUTH EVERY DAY   Repatha SureClick 401 MG/ML Soaj Generic drug: Evolocumab Inject 1 pen into the skin every 14 (fourteen) days.   rivaroxaban 20 MG Tabs tablet Commonly known as: Xarelto TAKE 1 TABLET BY MOUTH DAILY WITH SUPPER What changed:   how much to take  how to take this  when to take this  additional instructions       No Known Allergies  Consultations:  Neurology    Procedures/Studies: MR  ANGIO HEAD WO CONTRAST  Result Date: 03/21/2020 CLINICAL DATA:  Initial evaluation for acute dizziness, gait disturbance. EXAM: MRI HEAD WITHOUT CONTRAST MRA HEAD WITHOUT CONTRAST TECHNIQUE: Multiplanar, multiecho pulse sequences of the brain and surrounding structures were obtained without  intravenous contrast. Angiographic images of the head were obtained using MRA technique without contrast. COMPARISON:  Prior head CT from earlier the same day. FINDINGS: MRI HEAD FINDINGS Brain: Cerebral volume within normal limits for age. Scattered patchy T2/FLAIR hyperintensity noted involving the periventricular and deep white matter both cerebral hemispheres. Note made of a mildly prominent 13 mm FLAIR hyperintensity involving the subcortical white matter of the right frontal operculum (series 11, image 17). Findings are nonspecific, but most commonly related to chronic microvascular ischemic disease. Overall, appearance is mild for age. No abnormal foci of restricted diffusion to suggest acute or subacute ischemia. Gray-white matter differentiation maintained. No encephalomalacia to suggest chronic cortical infarction identified. No foci of susceptibility artifact to suggest acute or chronic intracranial hemorrhage. No mass lesion, midline shift or mass effect. Ventricles normal size without hydrocephalus. No extra-axial fluid collection. Pituitary gland suprasellar region within normal limits. Midline structures intact. Vascular: Major intracranial vascular flow voids are well maintained. Skull and upper cervical spine: Craniocervical junction within normal limits. Upper cervical spine within normal limits. Bone marrow signal intensity normal. No scalp soft tissue abnormality. Sinuses/Orbits: Globes and orbital soft tissues within normal limits. Mild scattered mucosal thickening noted within the ethmoidal air cells. Paranasal sinuses are otherwise clear. No mastoid effusion. Inner ear structures grossly normal. Other: None. MRA HEAD FINDINGS ANTERIOR CIRCULATION: Visualized distal cervical segments of the internal carotid arteries are widely patent with symmetric antegrade flow. Petrous, cavernous, and supraclinoid ICAs widely patent without stenosis or other abnormality. A1 segments widely patent. Normal  anterior communicating artery complex. Anterior cerebral arteries widely patent to their distal aspects without stenosis. No M1 stenosis or occlusion. Normal MCA bifurcations. Distal MCA branches well and. POSTERIOR CIRCULATION: Tubal arteries widely patent to the vertebrobasilar junction without stenosis. Left vertebral artery slightly dominant. Both picas patent. Basilar widely patent to its distal aspect without stenosis. Superior cerebral arteries patent bilaterally. Both PCAs primarily supplied via the basilar well perfused to their distal aspects. No intracranial aneurysm or other vascular abnormality. IMPRESSION: MRI HEAD IMPRESSION: 1. No acute intracranial infarct or other abnormality. 2. Mild cerebral white matter disease involving the periventricular and deep white matter of both cerebral hemispheres as well as the right frontal operculum. Overall, changes are nonspecific, but most commonly related to chronic microvascular ischemic disease. Appearance is mild for age. MRA HEAD IMPRESSION: Normal intracranial MRA. No large vessel occlusion, hemodynamically significant stenosis, or other acute vascular abnormality. No aneurysm. Electronically Signed   By: Jeannine Boga M.D.   On: 03/21/2020 19:32   MR ANGIO NECK W WO CONTRAST  Result Date: 03/21/2020 CLINICAL DATA:  Neuro deficit EXAM: MRA NECK WITHOUT AND WITH CONTRAST TECHNIQUE: Multiplanar and multiecho pulse sequences of the neck were obtained without and with intravenous contrast. Angiographic images of the neck were obtained using MRA technique without and with intravenous contrast. CONTRAST:  8.70mL GADAVIST GADOBUTROL 1 MMOL/ML IV SOLN COMPARISON:  Concurrent MRA/MRI head. FINDINGS: There is no high-grade narrowing or focal aneurysm involving the bilateral carotid arteries. No evidence of dissection. The bilateral vertebral arteries are patent and demonstrate antegrade flow. Codominant vertebral arteries. No evidence of high-grade  narrowing or focal aneurysm. Three vessel aortic arch.  Patent great vessel origins. IMPRESSION: No evidence of high-grade narrowing,  dissection or aneurysm within the neck. Electronically Signed   By: Primitivo Gauze M.D.   On: 03/21/2020 18:56   MR BRAIN WO CONTRAST  Result Date: 03/21/2020 CLINICAL DATA:  Initial evaluation for acute dizziness, gait disturbance. EXAM: MRI HEAD WITHOUT CONTRAST MRA HEAD WITHOUT CONTRAST TECHNIQUE: Multiplanar, multiecho pulse sequences of the brain and surrounding structures were obtained without intravenous contrast. Angiographic images of the head were obtained using MRA technique without contrast. COMPARISON:  Prior head CT from earlier the same day. FINDINGS: MRI HEAD FINDINGS Brain: Cerebral volume within normal limits for age. Scattered patchy T2/FLAIR hyperintensity noted involving the periventricular and deep white matter both cerebral hemispheres. Note made of a mildly prominent 13 mm FLAIR hyperintensity involving the subcortical white matter of the right frontal operculum (series 11, image 17). Findings are nonspecific, but most commonly related to chronic microvascular ischemic disease. Overall, appearance is mild for age. No abnormal foci of restricted diffusion to suggest acute or subacute ischemia. Gray-white matter differentiation maintained. No encephalomalacia to suggest chronic cortical infarction identified. No foci of susceptibility artifact to suggest acute or chronic intracranial hemorrhage. No mass lesion, midline shift or mass effect. Ventricles normal size without hydrocephalus. No extra-axial fluid collection. Pituitary gland suprasellar region within normal limits. Midline structures intact. Vascular: Major intracranial vascular flow voids are well maintained. Skull and upper cervical spine: Craniocervical junction within normal limits. Upper cervical spine within normal limits. Bone marrow signal intensity normal. No scalp soft tissue  abnormality. Sinuses/Orbits: Globes and orbital soft tissues within normal limits. Mild scattered mucosal thickening noted within the ethmoidal air cells. Paranasal sinuses are otherwise clear. No mastoid effusion. Inner ear structures grossly normal. Other: None. MRA HEAD FINDINGS ANTERIOR CIRCULATION: Visualized distal cervical segments of the internal carotid arteries are widely patent with symmetric antegrade flow. Petrous, cavernous, and supraclinoid ICAs widely patent without stenosis or other abnormality. A1 segments widely patent. Normal anterior communicating artery complex. Anterior cerebral arteries widely patent to their distal aspects without stenosis. No M1 stenosis or occlusion. Normal MCA bifurcations. Distal MCA branches well and. POSTERIOR CIRCULATION: Tubal arteries widely patent to the vertebrobasilar junction without stenosis. Left vertebral artery slightly dominant. Both picas patent. Basilar widely patent to its distal aspect without stenosis. Superior cerebral arteries patent bilaterally. Both PCAs primarily supplied via the basilar well perfused to their distal aspects. No intracranial aneurysm or other vascular abnormality. IMPRESSION: MRI HEAD IMPRESSION: 1. No acute intracranial infarct or other abnormality. 2. Mild cerebral white matter disease involving the periventricular and deep white matter of both cerebral hemispheres as well as the right frontal operculum. Overall, changes are nonspecific, but most commonly related to chronic microvascular ischemic disease. Appearance is mild for age. MRA HEAD IMPRESSION: Normal intracranial MRA. No large vessel occlusion, hemodynamically significant stenosis, or other acute vascular abnormality. No aneurysm. Electronically Signed   By: Jeannine Boga M.D.   On: 03/21/2020 19:32   CT ABDOMEN PELVIS W CONTRAST  Result Date: 03/21/2020 CLINICAL DATA:  Nausea vomiting EXAM: CT ABDOMEN AND PELVIS WITH CONTRAST TECHNIQUE: Multidetector CT  imaging of the abdomen and pelvis was performed using the standard protocol following bolus administration of intravenous contrast. CONTRAST:  170mL OMNIPAQUE IOHEXOL 300 MG/ML  SOLN COMPARISON:  March 09, 2020 FINDINGS: Lower chest: The visualized heart size within normal limits. No pericardial fluid/thickening. A small hiatal hernia is present. Mild streaky atelectasis seen at both lung bases. Hepatobiliary: The liver is normal in density without focal abnormality.The main portal vein is patent. The patient  is status post cholecystectomy. No biliary ductal dilation. Pancreas: Unremarkable. No pancreatic ductal dilatation or surrounding inflammatory changes. Spleen: Normal in size without focal abnormality. Adrenals/Urinary Tract: Both adrenal glands appear normal. The kidneys and collecting system appear normal without evidence of urinary tract calculus or hydronephrosis. Bladder is unremarkable. Stomach/Bowel: The stomach, small bowel, and colon are normal in appearance. No inflammatory changes, wall thickening, or obstructive findings. Scattered colonic diverticula are noted. Surgical anastomosis seen at the sigmoid rectal junction. Moderate amount of colonic stool seen in the sigmoid rectal junction. The appendix is normal. Vascular/Lymphatic: There are no enlarged mesenteric, retroperitoneal, or pelvic lymph nodes. Again noted is stable misty mesentery within the mid abdomen with scattered small lymph nodes. Mild atherosclerosis seen at the aorta bi-iliac bifurcation. Reproductive: The patient is status post hysterectomy. No adnexal masses or collections seen. Other: No evidence of abdominal wall mass or hernia. Musculoskeletal: No acute or significant osseous findings. IMPRESSION: The stable findings suggestive of mesenteric panniculitis. Diverticulosis without diverticulitis. Aortic Atherosclerosis (ICD10-I70.0). Electronically Signed   By: Prudencio Pair M.D.   On: 03/21/2020 03:51   CT ABDOMEN PELVIS W  CONTRAST  Result Date: 03/09/2020 CLINICAL DATA:  Abdominal distension and pain for 2 days. Patient is COVID-19 positive. EXAM: CT ABDOMEN AND PELVIS WITH CONTRAST TECHNIQUE: Multidetector CT imaging of the abdomen and pelvis was performed using the standard protocol following bolus administration of intravenous contrast. CONTRAST:  153mL OMNIPAQUE IOHEXOL 300 MG/ML  SOLN COMPARISON:  CT chest, abdomen pelvis dated 03/10/2013. FINDINGS: Lower chest: Peripheral predominant airspace opacities are partially imaged. Hepatobiliary: No focal liver abnormality is seen. Status post cholecystectomy. No biliary dilatation. Pancreas: Unremarkable. No pancreatic ductal dilatation or surrounding inflammatory changes. Spleen: Normal in size without focal abnormality. Adrenals/Urinary Tract: Adrenal glands are unremarkable. Kidneys are normal, without renal calculi, focal lesion, or hydronephrosis. Bladder is unremarkable. Stomach/Bowel: Stomach is within normal limits. Appendix appears normal. No evidence of bowel wall thickening, distention, or inflammatory changes. A surgical anastomosis is seen in the pelvis. Vascular/Lymphatic: Aortic atherosclerosis. There are prominent mesenteric lymph nodes with associated moderate mesenteric fat stranding. No enlarged pelvic lymph nodes. Reproductive: Status post hysterectomy. No adnexal masses. Other: No abdominal wall hernia or abnormality. No abdominopelvic ascites. Musculoskeletal: No acute or significant osseous findings. IMPRESSION: 1. 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 2. Peripheral predominant airspace opacities are partially imaged and likely represent COVID-19 pneumonia. Aortic Atherosclerosis (ICD10-I70.0). Electronically Signed   By: Zerita Boers M.D.   On: 03/09/2020 22:31   CT HEAD CODE STROKE WO CONTRAST  Result Date: 03/21/2020 CLINICAL DATA:  Code stroke. 64 year old female with  sudden onset dizziness at 2233 hours. EXAM: CT HEAD WITHOUT CONTRAST TECHNIQUE: Contiguous axial images were obtained from the base of the skull through the vertex without intravenous contrast. COMPARISON:  Cervical spine MRI 10/15/2018. FINDINGS: Brain: Cerebral volume is within normal limits for age. No midline shift, ventriculomegaly, mass effect, evidence of mass lesion, intracranial hemorrhage or evidence of cortically based acute infarction. Mild for age white matter hypodensity in the right at the right frontal operculum (series 3, image 20). Elsewhere gray-white matter differentiation is within normal limits throughout the brain. No cortical encephalomalacia. Vascular: No suspicious intracranial vascular hyperdensity. Skull: No acute osseous abnormality identified. Impacted left maxillary tooth (series 2, image 4). Sinuses/Orbits: Visualized paranasal sinuses and mastoids are clear. Tympanic cavities are clear. Other: Visualized orbits and scalp soft tissues are within normal limits. ASPECTS [facility] Stroke Program Early  CT Score) Total score (0-10 with 10 being normal): 10 IMPRESSION: 1. No acute cortically based infarct or acute intracranial hemorrhage identified. ASPECTS 10. 2. Mild nonspecific white matter changes in the right frontal operculum. Study discussed by telephone with Dr. Laverta Baltimore in the ED on 03/21/2020 at 00:12 . Electronically Signed   By: Genevie Ann M.D.   On: 03/21/2020 00:14        Subjective: Patient is feeling better, no nausea or vomiting, no dyspnea or chest pain.   Discharge Exam: Vitals:   03/22/20 0711 03/22/20 1145  BP: 127/76 136/72  Pulse: (!) 59 66  Resp: 16 18  Temp: 98.3 F (36.8 C) 98.4 F (36.9 C)  SpO2: 95% 96%   Vitals:   03/21/20 2323 03/22/20 0300 03/22/20 0711 03/22/20 1145  BP: 128/83 127/76 127/76 136/72  Pulse: 73 60 (!) 59 66  Resp: 17 17 16 18   Temp: 98 F (36.7 C) 98 F (36.7 C) 98.3 F (36.8 C) 98.4 F (36.9 C)  TempSrc: Oral Oral Oral  Oral  SpO2: 96% 95% 95% 96%  Weight:        General: Not in pain or dyspnea  Neurology: Awake and alert, non focal  E ENT: no pallor, no icterus, oral mucosa moist Cardiovascular: No JVD. S1-S2 present, rhythmic, no gallops, rubs, or murmurs. No lower extremity edema. Pulmonary: positive breath sounds bilaterally,  Gastrointestinal. Abdomen soft and non tender Skin. No rashes Musculoskeletal: no joint deformities   The results of significant diagnostics from this hospitalization (including imaging, microbiology, ancillary and laboratory) are listed below for reference.     Microbiology: Recent Results (from the past 240 hour(s))  SARS Coronavirus 2 by RT PCR (hospital order, performed in [facility] hospital lab) Nasopharyngeal Nasopharyngeal Swab     Status: Abnormal   Collection Time: 03/21/20  9:16 AM   Specimen: Nasopharyngeal Swab  Result Value Ref Range Status   SARS Coronavirus 2 POSITIVE (A) NEGATIVE Final    Comment: RESULT CALLED TO, READ BACK BY AND VERIFIED WITH: CALLED TO M.SIMMS RN AT 1104 ON [id number] BY SROY (NOTE) SARS-CoV-2 target nucleic acids are DETECTED  SARS-CoV-2 RNA is generally detectable in upper respiratory specimens  during the acute phase of infection.  Positive results are indicative  of the presence of the identified virus, but do not rule out bacterial infection or co-infection with other pathogens not detected by the test.  Clinical correlation with patient history and  other diagnostic information is necessary to determine patient infection status.  The expected result is negative.  Fact Sheet for Patients:   StrictlyIdeas.no   Fact Sheet for Healthcare Providers:   BankingDealers.co.za    This test is not yet approved or cleared by the [place] FDA and  has been authorized for detection and/or diagnosis of SARS-CoV-2 by FDA under an Emergency Use Authorization (EUA).  This EUA  will remain in effect (mea ning this test can be used) for the duration of  the COVID-19 declaration under Section 564(b)(1) of the Act, 21 U.S.C. section 360-bbb-3(b)(1), unless the authorization is terminated or revoked sooner.  Performed at [facility], [address] [place], [place] [address]      Labs: BNP (last 3 results) No results for input(s): BNP in the last 8760 hours. Basic Metabolic Panel: Recent Labs  Lab 03/21/20 0136 03/22/20 0434  NA 139 143  K 3.5 3.6  CL 105 108  CO2 24 24  GLUCOSE 166* 116*  BUN  14 13  CREATININE 0.75 0.87  CALCIUM 8.9 9.1   Liver Function Tests: Recent Labs  Lab 03/21/20 0136 03/22/20 0434  AST 33 23  ALT 59* 45*  ALKPHOS 165* 141*  BILITOT 0.3 1.0  PROT 6.6 5.6*  ALBUMIN 4.0 3.5   Recent Labs  Lab 03/21/20 0139  LIPASE 39   No results for input(s): AMMONIA in the last 168 hours. CBC: Recent Labs  Lab 03/21/20 0136 03/22/20 0434  WBC 7.5 4.2  NEUTROABS 6.1 2.1  HGB 12.7 12.1  HCT 38.0 36.9  MCV 92.5 93.9  PLT 202 197   Cardiac Enzymes: No results for input(s): CKTOTAL, CKMB, CKMBINDEX, TROPONINI in the last 168 hours. BNP: Invalid input(s): POCBNP CBG: Recent Labs  Lab 03/20/20 2358  GLUCAP 192*   D-Dimer No results for input(s): DDIMER in the last 72 hours. Hgb A1c Recent Labs    03/22/20 0434  HGBA1C 6.4*   Lipid Profile Recent Labs    03/22/20 0434  CHOL 165  HDL 42  LDLCALC 97  TRIG 132  CHOLHDL 3.9   Thyroid function studies No results for input(s): TSH, T4TOTAL, T3FREE, THYROIDAB in the last 72 hours.  Invalid input(s): FREET3 Anemia work up No results for input(s): VITAMINB12, FOLATE, FERRITIN, TIBC, IRON, RETICCTPCT in the last 72 hours. Urinalysis    Component Value Date/Time   COLORURINE YELLOW 03/21/2020 0027   APPEARANCEUR CLEAR 03/21/2020 0027   LABSPEC 1.025 03/21/2020 0027   PHURINE 7.5 03/21/2020 0027   GLUCOSEU NEGATIVE 03/21/2020 0027   HGBUR  NEGATIVE 03/21/2020 0027   BILIRUBINUR NEGATIVE 03/21/2020 0027   BILIRUBINUR negative 06/05/2012 1703   KETONESUR NEGATIVE 03/21/2020 0027   PROTEINUR NEGATIVE 03/21/2020 0027   UROBILINOGEN 0.2 06/05/2012 1703   UROBILINOGEN 0.2 07/14/2007 1019   NITRITE NEGATIVE 03/21/2020 0027   LEUKOCYTESUR NEGATIVE 03/21/2020 0027   Sepsis Labs Invalid input(s): PROCALCITONIN,  WBC,  LACTICIDVEN Microbiology Recent Results (from the past 240 hour(s))  SARS Coronavirus 2 by RT PCR (hospital order, performed in [place] hospital lab) Nasopharyngeal Nasopharyngeal Swab     Status: Abnormal   Collection Time: 03/21/20  9:16 AM   Specimen: Nasopharyngeal Swab  Result Value Ref Range Status   SARS Coronavirus 2 POSITIVE (A) NEGATIVE Final    Comment: RESULT CALLED TO, READ BACK BY AND VERIFIED WITH: CALLED TO M.SIMMS RN AT 1104 ON [id number] BY SROY (NOTE) SARS-CoV-2 target nucleic acids are DETECTED  SARS-CoV-2 RNA is generally detectable in upper respiratory specimens  during the acute phase of infection.  Positive results are indicative  of the presence of the identified virus, but do not rule out bacterial infection or co-infection with other pathogens not detected by the test.  Clinical correlation with patient history and  other diagnostic information is necessary to determine patient infection status.  The expected result is negative.  Fact Sheet for Patients:   StrictlyIdeas.no   Fact Sheet for Healthcare Providers:   BankingDealers.co.za    This test is not yet approved or cleared by the [place] FDA and  has been authorized for detection and/or diagnosis of SARS-CoV-2 by FDA under an Emergency Use Authorization (EUA).  This EUA will remain in effect (mea ning this test can be used) for the duration of  the COVID-19 declaration under Section 564(b)(1) of the Act, 21 U.S.C. section 360-bbb-3(b)(1), unless the authorization  is terminated or revoked sooner.  Performed at [facility], [address], [place], [place] [address]  Time coordinating discharge: 45 minutes  SIGNED:   Tawni Millers, MD  [facility] 03/22/2020, 4:01 PM

## 2020-03-22 NOTE — Progress Notes (Signed)
  Echocardiogram 2D Echocardiogram has been performed.  Michiel Cowboy 03/22/2020, 11:43 AM

## 2020-03-22 NOTE — Evaluation (Signed)
Physical Therapy Evaluation Patient Details Name: [name] MRN: [id number] DOB: [birth year] Today's Date: 03/22/2020   History of Present Illness  Pt is a 64 y/o female with PMH of colon CA, DVT (on Xarelto), HTN, recent covid-10 infection (positive test in late July) and prediabetes, presenting with sudden onset of dizziness, difficulty walking, nausea and vomiting. 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     Clinical Impression  Patient was received laying in bed. Able to answer all questions about home set-up and PLOF without interpreter, slight language barrier for more in-depth questions. UE and LE MMT WNL bilaterally. Global sensation intact. Pt stated that all symptoms have resolved and that she feels back to normal. Independent with bed mobility, transfers, and gait. Ambulated 500' with no use of AD and no LOB. Ascended and descended stairs 3x with no difficulties both using UE's and not using UE's. Left in bed with all needs within reach. Patient stated she felt like she was at her baseline in regards to gait and mobility. Recommend no f/u with physical therapy after d/c at this time.       Follow Up Recommendations No PT follow up    Equipment Recommendations  None recommended by PT    Recommendations for Other Services       Precautions / Restrictions Precautions Precautions: None Restrictions Weight Bearing Restrictions: No      Mobility  Bed Mobility Overal bed mobility: Independent                Transfers Overall transfer level: Independent                  Ambulation/Gait Ambulation/Gait assistance: Independent Gait Distance (Feet): 500 Feet Assistive device: None Gait Pattern/deviations: WFL(Within  Functional Limits) Gait velocity: normal   General Gait Details: tolerated gait training and stairs well, no LOB.  Stairs Stairs: Yes Stairs assistance: Independent Stair Management: No rails;Two rails Number of Stairs: 2 General stair comments: patient was able to ascend and descend stairs in neuro gym both with and w/o use of UE's  Wheelchair Mobility    Modified Rankin (Stroke Patients Only)       Balance Overall balance assessment: Independent                                           Pertinent Vitals/Pain Pain Assessment: No/denies pain    Home Living Family/patient expects to be discharged to:: Private residence Living Arrangements: Spouse/significant other;Children   Type of Home: House Home Access: Stairs to enter Entrance Stairs-Rails: Psychiatric nurse of Steps: 5 Home Layout: One level Home Equipment: None      Prior Function Level of Independence: Independent         Comments: driving, working full time      Hand Dominance   Dominant Hand: Right    Extremity/Trunk Assessment   Upper Extremity Assessment Upper Extremity Assessment: Defer to OT evaluation    Lower Extremity Assessment Lower Extremity Assessment: Overall WFL for tasks assessed    Cervical / Trunk Assessment Cervical / Trunk Assessment: Normal  Communication   Communication: No difficulties  Cognition Arousal/Alertness: Awake/alert Behavior During Therapy: WFL for tasks assessed/performed Overall Cognitive Status:  Within Functional Limits for tasks assessed                                 General Comments: appears [facility] for basic tasks, slight language barrier but pt can speak and understand english      General Comments General comments (skin integrity, edema, etc.): pt reports symptoms have resolved and she feels "back to normal"    Exercises     Assessment/Plan    PT Assessment Patient needs continued PT services   PT Problem List Decreased strength;Decreased mobility       PT Treatment Interventions Gait training;Stair training    PT Goals (Current goals can be found in the Care Plan section)  Acute Rehab PT Goals Patient Stated Goal: to get home  PT Goal Formulation: With patient Time For Goal Achievement: 04/05/20 Potential to Achieve Goals: Good    Frequency Min 2X/week   Barriers to discharge        Co-evaluation               AM-PAC PT "6 Clicks" Mobility  Outcome Measure Help needed turning from your back to your side while in a flat bed without using bedrails?: Total Help needed moving from lying on your back to sitting on the side of a flat bed without using bedrails?: Total Help needed moving to and from a bed to a chair (including a wheelchair)?: Total Help needed standing up from a chair using your arms (e.g., wheelchair or bedside chair)?: Total Help needed to walk in hospital room?: Total Help needed climbing 3-5 steps with a railing? : Total 6 Click Score: 6    End of Session Equipment Utilized During Treatment: Gait belt Activity Tolerance: Patient tolerated treatment well Patient left: in bed;with call bell/phone within reach   PT Visit Diagnosis: Other abnormalities of gait and mobility (R26.89);Other symptoms and signs involving the nervous system (R29.898)    Time:  -      Charges:              Livingston Diones, SPT, ATC

## 2020-03-31 ENCOUNTER — Other Ambulatory Visit: Payer: 59

## 2020-03-31 ENCOUNTER — Other Ambulatory Visit: Payer: Self-pay

## 2020-04-04 ENCOUNTER — Other Ambulatory Visit: Payer: Self-pay

## 2020-04-04 ENCOUNTER — Encounter (HOSPITAL_BASED_OUTPATIENT_CLINIC_OR_DEPARTMENT_OTHER): Payer: Self-pay | Admitting: *Deleted

## 2020-04-04 ENCOUNTER — Telehealth: Payer: Self-pay

## 2020-04-04 DIAGNOSIS — I10 Essential (primary) hypertension: Secondary | ICD-10-CM | POA: Insufficient documentation

## 2020-04-04 DIAGNOSIS — R1013 Epigastric pain: Secondary | ICD-10-CM | POA: Insufficient documentation

## 2020-04-04 DIAGNOSIS — R110 Nausea: Secondary | ICD-10-CM | POA: Insufficient documentation

## 2020-04-04 DIAGNOSIS — Z85038 Personal history of other malignant neoplasm of large intestine: Secondary | ICD-10-CM | POA: Diagnosis not present

## 2020-04-04 DIAGNOSIS — H538 Other visual disturbances: Secondary | ICD-10-CM | POA: Diagnosis not present

## 2020-04-04 DIAGNOSIS — Z79899 Other long term (current) drug therapy: Secondary | ICD-10-CM | POA: Insufficient documentation

## 2020-04-04 DIAGNOSIS — R42 Dizziness and giddiness: Secondary | ICD-10-CM | POA: Insufficient documentation

## 2020-04-04 DIAGNOSIS — G720 Drug-induced myopathy: Secondary | ICD-10-CM

## 2020-04-04 LAB — CBC
HCT: 39.9 % (ref 36.0–46.0)
Hemoglobin: 13.1 g/dL (ref 12.0–15.0)
MCH: 30.8 pg (ref 26.0–34.0)
MCHC: 32.8 g/dL (ref 30.0–36.0)
MCV: 93.7 fL (ref 80.0–100.0)
Platelets: 171 10*3/uL (ref 150–400)
RBC: 4.26 MIL/uL (ref 3.87–5.11)
RDW: 13.5 % (ref 11.5–15.5)
WBC: 5.1 10*3/uL (ref 4.0–10.5)
nRBC: 0 % (ref 0.0–0.2)

## 2020-04-04 LAB — COMPREHENSIVE METABOLIC PANEL
ALT: 21 U/L (ref 0–44)
AST: 23 U/L (ref 15–41)
Albumin: 4.5 g/dL (ref 3.5–5.0)
Alkaline Phosphatase: 108 U/L (ref 38–126)
Anion gap: 11 (ref 5–15)
BUN: 14 mg/dL (ref 8–23)
CO2: 24 mmol/L (ref 22–32)
Calcium: 9.4 mg/dL (ref 8.9–10.3)
Chloride: 107 mmol/L (ref 98–111)
Creatinine, Ser: 0.87 mg/dL (ref 0.44–1.00)
GFR calc Af Amer: >60 mL/min (ref >60)
GFR calc non Af Amer: >60 mL/min (ref >60)
Glucose, Bld: 123 mg/dL — ABNORMAL HIGH (ref 70–99)
Potassium: 3.4 mmol/L — ABNORMAL LOW (ref 3.5–5.1)
Sodium: 142 mmol/L (ref 135–145)
Total Bilirubin: 0.4 mg/dL (ref 0.3–1.2)
Total Protein: 7.3 g/dL (ref 6.5–8.1)

## 2020-04-04 LAB — URINALYSIS, ROUTINE W REFLEX MICROSCOPIC
Bilirubin Urine: NEGATIVE
Glucose, UA: NEGATIVE mg/dL
Hgb urine dipstick: NEGATIVE
Ketones, ur: NEGATIVE mg/dL
Nitrite: NEGATIVE
Protein, ur: NEGATIVE mg/dL
Specific Gravity, Urine: 1.01 (ref 1.005–1.030)
pH: 7.5 (ref 5.0–8.0)

## 2020-04-04 LAB — URINALYSIS, MICROSCOPIC (REFLEX)

## 2020-04-04 LAB — LIPASE, BLOOD: Lipase: 29 U/L (ref 11–51)

## 2020-04-04 NOTE — Telephone Encounter (Signed)
Called and lmomed the pt to schedule lipid labs, orders placed

## 2020-04-04 NOTE — Telephone Encounter (Signed)
-----   Message from Ramond Dial, [place] sent at 04/04/2020  7:13 AM EDT -----  ----- Message ----- From: Ramond Dial, RPH-CPP Sent: 04/04/2020 To: Ramond Dial, RPH-CPP  Set up lipids labs

## 2020-04-04 NOTE — ED Triage Notes (Signed)
Nausea and elevated BP. She feels pressure on her chest and brain.  She was seen 10 days ago for same symptoms. She was admitted, had testing and discharged home to follow up with her MD.

## 2020-04-05 ENCOUNTER — Emergency Department (HOSPITAL_BASED_OUTPATIENT_CLINIC_OR_DEPARTMENT_OTHER)
Admission: EM | Admit: 2020-04-05 | Discharge: 2020-04-05 | Disposition: A | Discharge status: Home / Self Care | Payer: 59 | Attending: Emergency Medicine | Admitting: Emergency Medicine

## 2020-04-05 DIAGNOSIS — R110 Nausea: Secondary | ICD-10-CM

## 2020-04-05 DIAGNOSIS — R030 Elevated blood-pressure reading, without diagnosis of hypertension: Secondary | ICD-10-CM

## 2020-04-05 DIAGNOSIS — R1013 Epigastric pain: Secondary | ICD-10-CM

## 2020-04-05 MED ORDER — PANTOPRAZOLE SODIUM 40 MG PO TBEC
40.0000 mg | DELAYED_RELEASE_TABLET | Freq: Once | ORAL | Status: AC
  Administered 2020-04-05: 40 mg via ORAL
  Filled 2020-04-05: qty 1

## 2020-04-05 MED ORDER — OMEPRAZOLE 20 MG PO CPDR
20.0000 mg | DELAYED_RELEASE_CAPSULE | Freq: Every day | ORAL | 0 refills | Status: DC
Start: 2020-04-05 — End: 2020-04-27

## 2020-04-05 MED ORDER — ONDANSETRON 8 MG PO TBDP: 8.0000 mg | ORAL_TABLET | Freq: Three times a day (TID) | ORAL | 1 refills | Status: DC | PRN

## 2020-04-05 NOTE — ED Provider Notes (Addendum)
[facility] DEPT [facility] Provider Note: Georgena Spurling, MD, FACEP  CSN: [id number] MRN: [id number] ARRIVAL: 04/04/20 at 2102 ROOM: [place]  Nausea   HISTORY OF PRESENT ILLNESS  81/85/63 1:49 AM [name] is a 64 y.o. female who was admitted on 03/22/2020 for dizziness.  She had an work-up that included head CT, MRI of the brain and echocardiography that were negative and CVA was ruled out.  She had COVID-19 in July of this year.  She returns with epigastric pain and nausea after eating yesterday.  She rates the pain as about a 3 out of 10 but cannot describe the nature of the pain.  It is somewhat worse with palpation.  She has not actually vomited with this.  She noted that her blood pressure was up, which she attributes to the nausea and pain, as high as 188/105 at home.  Her blood pressures here have been lower with normal diastolics.  She states when her blood pressure goes up she feels dizzy (lightheaded) with blurred vision.     Past Medical History:  Diagnosis Date  . Bilateral leg pain 07/02/2016  . Cancer ([place]) 08/03/2010   colon  . 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 History of colon cancer, stage I 08/17/2015  . HLD (hyperlipidemia)   . Hypertension   . Prediabetes 05/25/2019  . Uterine fibroid   . Varicose 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 08/26/2014    Past Surgical History:  Procedure Laterality Date  . ABDOMINAL HYSTERECTOMY    . CHOLECYSTECTOMY    . COLON SURGERY    . GALLBLADDER SURGERY    . MEDIASTERNOTOMY N/A 11/24/2017   Procedure: PARTIAL STERNOTOMY;  Surgeon: [name], MD;  Location: [facility];  Service: Thoracic;  Laterality: N/A;  . THYMECTOMY  11/24/2017   Procedure: THYMECTOMY;  Surgeon: [name], MD;  Location: [facility];  Service: Thoracic;;    Family History  Problem Relation Age of Onset  . Heart attack Mother   . Hypertension Father      Social History   Tobacco Use  . Smoking status: Never Smoker  . Smokeless tobacco: Never Used  Vaping Use  . Vaping Use: Never used  Substance Use Topics  . Alcohol use: No    Alcohol/week: 0.0 standard drinks  . Drug use: No    Prior to Admission medications   Medication Sig Start Date End Date Taking? Authorizing Provider  Evolocumab (REPATHA SURECLICK) 702 MG/ML SOAJ Inject 1 pen into the skin every 14 (fourteen) days. 01/11/20   Dorothy Spark, MD  hydrALAZINE (APRESOLINE) 50 MG tablet Take 50 mg by mouth 3 (three) times daily.    [provider]  lisinopril (ZESTRIL) 40 MG tablet TAKE 1 TABLET BY MOUTH EVERY DAY Patient taking differently: Take 40 mg by mouth daily.  05/27/19   Copland, Gay Filler, MD  omeprazole (PRILOSEC) 20 MG capsule Take 1 capsule (20 mg total) by mouth daily. 04/05/20   Kenyata Napier, Jenny Reichmann, MD  ondansetron (ZOFRAN ODT) 8 MG disintegrating tablet Take 1 tablet (8 mg total) by mouth every 8 (eight) hours as needed for nausea or vomiting. 04/05/20   Aariyah Sampey, Jenny Reichmann, MD  rivaroxaban (XARELTO) 20 MG TABS tablet TAKE 1 TABLET BY MOUTH DAILY WITH SUPPER Patient taking differently: Take 20 mg by mouth daily with breakfast.  05/26/19   Copland, Gay Filler, MD    Allergies Patient has no  known allergies.   REVIEW OF SYSTEMS  Negative except as noted here or in the History of Present Illness.   PHYSICAL EXAMINATION  Initial Vital Signs Blood pressure (!) 171/84, pulse 61, temperature 98 F (36.7 C), temperature source Oral, resp. rate 16, height 5\' 7"  (1.702 m), weight 86.7 kg, SpO2 99 %.  Examination General: Well-developed, well-nourished female in no acute distress; appearance consistent with age of record HENT: normocephalic; atraumatic Eyes: pupils equal, round and reactive to light; extraocular muscles intact Neck: supple Heart: regular rate and rhythm Lungs: clear to auscultation bilaterally Abdomen: soft; nondistended; mild epigastric  tenderness; no masses or hepatosplenomegaly; bowel sounds present Extremities: No deformity; full range of motion; pulses normal Neurologic: Awake, alert and oriented; motor function intact in all extremities and symmetric; no facial droop Skin: Warm and dry Psychiatric: Normal mood and affect   RESULTS  Summary of this visit's results, reviewed and interpreted by myself:  EKG Interpretation:  Date & Time: 04/04/2020 9:40 PM  Rate: 72  Rhythm: normal sinus rhythm  QRS Axis: normal  Intervals: normal  ST/T Wave abnormalities: nonspecific ST/T changes  Conduction Disutrbances:none  Narrative Interpretation:   Old EKG Reviewed: unchanged    Laboratory Studies: Results for orders placed or performed during the hospital encounter of 04/05/20 (from the past 24 hour(s))  Lipase, blood     Status: None   Collection Time: 04/04/20  9:32 PM  Result Value Ref Range   Lipase 29 11 - 51 U/L  Comprehensive metabolic panel     Status: Abnormal   Collection Time: 04/04/20  9:32 PM  Result Value Ref Range   Sodium 142 135 - 145 mmol/L   Potassium 3.4 (L) 3.5 - 5.1 mmol/L   Chloride 107 98 - 111 mmol/L   CO2 24 22 - 32 mmol/L   Glucose, Bld 123 (H) 70 - 99 mg/dL   BUN 14 8 - 23 mg/dL   Creatinine, Ser 0.87 0.44 - 1.00 mg/dL   Calcium 9.4 8.9 - 10.3 mg/dL   Total Protein 7.3 6.5 - 8.1 g/dL   Albumin 4.5 3.5 - 5.0 g/dL   AST 23 15 - 41 U/L   ALT 21 0 - 44 U/L   Alkaline Phosphatase 108 38 - 126 U/L   Total Bilirubin 0.4 0.3 - 1.2 mg/dL   GFR calc non Af Amer >60 >60 mL/min   GFR calc Af Amer >60 >60 mL/min   Anion gap 11 5 - 15  CBC     Status: None   Collection Time: 04/04/20  9:32 PM  Result Value Ref Range   WBC 5.1 4.0 - 10.5 K/uL   RBC 4.26 3.87 - 5.11 MIL/uL   Hemoglobin 13.1 12.0 - 15.0 g/dL   HCT 39.9 36 - 46 %   MCV 93.7 80.0 - 100.0 fL   MCH 30.8 26.0 - 34.0 pg   MCHC 32.8 30.0 - 36.0 g/dL   RDW 13.5 11.5 - 15.5 %   Platelets 171 150 - 400 K/uL   nRBC 0.0 0.0 - 0.2 %   Urinalysis, Routine w reflex microscopic Urine, Clean Catch     Status: Abnormal   Collection Time: 04/04/20  9:32 PM  Result Value Ref Range   Color, Urine YELLOW YELLOW   APPearance CLEAR CLEAR   Specific Gravity, Urine 1.010 1.005 - 1.030   pH 7.5 5.0 - 8.0   Glucose, UA NEGATIVE NEGATIVE mg/dL   Hgb urine dipstick NEGATIVE NEGATIVE   Bilirubin Urine  NEGATIVE NEGATIVE   Ketones, ur NEGATIVE NEGATIVE mg/dL   Protein, ur NEGATIVE NEGATIVE mg/dL   Nitrite NEGATIVE NEGATIVE   Leukocytes,Ua TRACE (A) NEGATIVE  Urinalysis, Microscopic (reflex)     Status: Abnormal   Collection Time: 04/04/20  9:32 PM  Result Value Ref Range   RBC / HPF 0-5 0 - 5 RBC/hpf   WBC, UA 0-5 0 - 5 WBC/hpf   Bacteria, UA RARE (A) NONE SEEN   Squamous Epithelial / LPF 0-5 0 - 5   Imaging Studies: No results found.  ED COURSE and MDM  Nursing notes, initial and subsequent vitals signs, including pulse oximetry, reviewed and interpreted by myself.  Vitals:   04/04/20 2129 04/05/20 0202 04/05/20 0353 04/05/20 0354  BP: (!) 160/85 (!) 171/84 (!) 171/97 (!) 160/76  Pulse: 74 61 72   Resp: 18 16 (!) 21   Temp: 98.5 F (36.9 C) 98 F (36.7 C) 97.9 F (36.6 C)   TempSrc: Oral Oral Oral   SpO2: 99% 99% 98%   Weight:      Height:       Medications  pantoprazole (PROTONIX) EC tablet 40 mg (has no administration in time range)    3:55 AM BP 160/76 and patient is feeling better.  I suspect she has gastritis or early peptic ulcer disease.  We will start her on a PPI and Zofran for nausea.  She is not currently on any stomach medication.  PROCEDURES  Procedures   ED DIAGNOSES     ICD-10-CM   1. Epigastric pain  R10.13   2. Nausea  R11.0   3. Transient elevated blood pressure  R03.0        Dax Murguia, MD 04/05/20 0357    Shanon Rosser, MD 04/05/20 0402    Shanon Rosser, MD 04/20/20 2244

## 2020-04-07 ENCOUNTER — Other Ambulatory Visit (INDEPENDENT_AMBULATORY_CARE_PROVIDER_SITE_OTHER): Payer: 59

## 2020-04-07 ENCOUNTER — Other Ambulatory Visit: Payer: Self-pay

## 2020-04-07 DIAGNOSIS — R7401 Elevation of levels of liver transaminase levels: Secondary | ICD-10-CM | POA: Diagnosis not present

## 2020-04-07 NOTE — Addendum Note (Signed)
Addended by: Kelle Darting A on: 04/07/2020 08:29 AM   Modules accepted: Orders

## 2020-04-10 LAB — HEPATIC FUNCTION PANEL
AG Ratio: 2.4 (calc) (ref 1.0–2.5)
ALT: 16 U/L (ref 6–29)
AST: 15 U/L (ref 10–35)
Albumin: 4.3 g/dL (ref 3.6–5.1)
Alkaline phosphatase (APISO): 100 U/L (ref 37–153)
Bilirubin, Direct: 0.1 mg/dL (ref 0.0–0.2)
Globulin: 1.8 g/dL (calc) — ABNORMAL LOW (ref 1.9–3.7)
Indirect Bilirubin: 0.3 mg/dL (calc) (ref 0.2–1.2)
Total Bilirubin: 0.4 mg/dL (ref 0.2–1.2)
Total Protein: 6.1 g/dL (ref 6.1–8.1)

## 2020-04-10 LAB — GAMMA GT: GGT: 63 U/L (ref 3–65)

## 2020-04-10 LAB — HEPATITIS PANEL, ACUTE
Hep A IgM: NONREACTIVE
Hep B C IgM: NONREACTIVE
Hepatitis B Surface Ag: NONREACTIVE
Hepatitis C Ab: NONREACTIVE
SIGNAL TO CUT-OFF: 0.01 (ref <1.00)

## 2020-04-11 ENCOUNTER — Other Ambulatory Visit: Payer: Self-pay

## 2020-04-11 ENCOUNTER — Encounter: Payer: Self-pay | Admitting: Family Medicine

## 2020-04-11 ENCOUNTER — Ambulatory Visit (HOSPITAL_COMMUNITY)
Admission: RE | Admit: 2020-04-11 | Discharge: 2020-04-11 | Disposition: A | Discharge status: Home / Self Care | Payer: 59 | Source: Ambulatory Visit | Attending: Internal Medicine | Admitting: Internal Medicine

## 2020-04-11 DIAGNOSIS — M79604 Pain in right leg: Secondary | ICD-10-CM | POA: Insufficient documentation

## 2020-04-11 DIAGNOSIS — M79605 Pain in left leg: Secondary | ICD-10-CM

## 2020-04-11 DIAGNOSIS — Z86718 Personal history of other venous thrombosis and embolism: Secondary | ICD-10-CM | POA: Diagnosis not present

## 2020-04-12 ENCOUNTER — Telehealth: Payer: Self-pay

## 2020-04-12 NOTE — Telephone Encounter (Signed)
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 She's on Xarelto for this and followed by Dr. Julien Nordmann.

## 2020-04-12 NOTE — Telephone Encounter (Signed)
The patient has been notified of the result and verbalized understanding.  All questions (if any) were answered. Wilma Flavin, RN 04/12/2020 8:07 AM

## 2020-04-24 NOTE — Progress Notes (Addendum)
[facility] [place], [place], [place] [address] [phone]  Date:  10/25/1759   Name:  [name]   DOB:  1955/10/08   MRN:  [id number]  PCP:  Darreld Mclean, MD    Chief Complaint: Hypertension (170/95, 180/105 readings at home, slight head pressure, nausea)   History of Present Illness:  [name] is a 64 y.o. very pleasant female patient who presents with the following:  Patient today with concern of blood pressure problem Last seen by myself in August of this year-at that time she was following up from history of COVID-19 infection and associated significant transaminitis She has history of colon cancer, recurrent DVT, hypertension, prediabetes, stage I thymoma status post thymectomy 2019 Currently taking lisinopril 40 mg daily for BP, hydralazine 50 TID and hctz 12.5- she brings in her pill bottles today and confirms this regimen  Her medication history is somewhat confusing- it seems pt has been unsure of her medications at times.  Her cardiology provider is Ermalinda Barrios PA-C  Most recent liver function tests 8/27- returned to normal  BP Readings from Last 3 Encounters:  04/27/20 (!) 142/90  04/05/20 (!) 160/76  03/22/20 (!) 151/87   She was also seen in the ER on 8/25, with epigastric pain.  She was started on PPI and Zofran per the ER.  Her BP was high at that visit   Flu vaccine- pt declines today.  I encouraged her to do this but she refuses  Pt notes "nausea in my head" when her BP is too high.  She likes to put a cold compress on her neck when this happens and it may help.  No chest pain or shortness of breath Pt notes her BP was 180/105 at home yesterday - this worried her   She also mentions intermittent RUQ pain which she has noted for perhaps 3 months She is not sure if worse after eating  She is not clear on how long it lasts, but it is not present all the time-may occur  perhaps once a day Not getting worse  Patient Active Problem List   Diagnosis Date Noted  . Stroke-like symptoms 03/21/2020  . HLD (hyperlipidemia)   . COVID-19 virus infection   . Elevated liver enzymes   . Ischemic stroke ([facility])   . 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 Prediabetes 05/25/2019  . 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 Bilateral leg pain 07/02/2016  . History of colon cancer, stage I 08/17/2015  . Varicose 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 08/26/2014  . HTN (hypertension) 12/24/2013  . Cancer of left colon ([place]) 02/25/2012  . DVT (deep venous thrombosis) ([facility]) 08/27/2011    Past Medical History:  Diagnosis Date  . Bilateral leg pain 07/02/2016  . Cancer ([place]) 08/03/2010   colon  . 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 History of colon cancer, stage I 08/17/2015  . HLD (hyperlipidemia)   . Hypertension   . Prediabetes 05/25/2019  . Uterine fibroid   . Varicose 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 08/26/2014    Past Surgical History:  Procedure Laterality Date  . ABDOMINAL HYSTERECTOMY    . CHOLECYSTECTOMY    . COLON SURGERY    . GALLBLADDER SURGERY    . MEDIASTERNOTOMY N/A 11/24/2017   Procedure: PARTIAL STERNOTOMY;  Surgeon: [name], MD;  Location: [facility];  Service:  Thoracic;  Laterality: N/A;  . THYMECTOMY  11/24/2017   Procedure: THYMECTOMY;  Surgeon: [name], MD;  Location: [facility] OR;  Service: Thoracic;;    Social History   Tobacco Use  . Smoking status: Never Smoker  . Smokeless tobacco: Never Used  Vaping Use  . Vaping Use: Never used  Substance Use Topics  . Alcohol use: No    Alcohol/week: 0.0 standard drinks  . Drug use: No    Family History  Problem Relation Age of Onset  . Heart attack Mother   . Hypertension Father     No Known Allergies  Medication list has been reviewed and updated.  Current Outpatient Medications on File Prior to Visit  Medication Sig  Dispense Refill  . Evolocumab (REPATHA SURECLICK) 595 MG/ML SOAJ Inject 1 pen into the skin every 14 (fourteen) days. 2 pen 11  . hydrALAZINE (APRESOLINE) 50 MG tablet Take 50 mg by mouth 3 (three) times daily.    . rivaroxaban (XARELTO) 20 MG TABS tablet TAKE 1 TABLET BY MOUTH DAILY WITH SUPPER (Patient taking differently: Take 20 mg by mouth daily with breakfast. ) 90 tablet 3   No current facility-administered medications on file prior to visit.    Review of Systems:  As per HPI- otherwise negative.   Physical Examination: Vitals:   04/27/20 1020 04/27/20 1038  BP: (!) 168/94 (!) 142/90  Pulse: 73   Resp: 16   SpO2: 98%    Vitals:   04/27/20 1020  Weight: 186 lb (84.4 kg)  Height: 5\' 7"  (1.702 m)   Body mass index is 29.13 kg/m. Ideal Body Weight: Weight in (lb) to have BMI = 25: 159.3  GEN: no acute distress.  Overweight, looks her normal self  HEENT: Atraumatic, Normocephalic.  Ears and Nose: No external deformity. CV: RRR, No M/G/R. No JVD. No thrill. No extra heart sounds. PULM: CTA B, no wheezes, crackles, rhonchi. No retractions. No resp. distress. No accessory muscle use. ABD: S, NT, ND, +BS. No rebound. No HSM.  Belly is benign at this time  EXTR: No c/c/e PSYCH: Normally interactive. Conversant.    Assessment and Plan: Essential hypertension - Plan: lisinopril-hydrochlorothiazide (ZESTORETIC) 20-12.5 MG tablet, Basic metabolic panel  RUQ pain - Plan: US Abdomen Limited RUQ  Dyslipidemia - Plan: Lipid panel  Pt here today to follow-up on HTN Her BP is reasonable on recheck today, but she continues to note some very high numbers at home  Will change her to lisinpril20/hctz 12.5 two daily, continue her hydralazine She is seeing her cardiology provider within about 3 weeks, can follow-up blood pressure at that time I did order a lipid panel for today-we will forward to her liver provider  Patient also notes right upper quadrant pain, details are somewhat  vague.  Recent liver tests normal Will order right upper quadrant ultrasound to evaluate her gallbladder Pt declines flu shot today  This visit occurred during the SARS-CoV-2 public health emergency.  Safety protocols were in place, including screening questions prior to the visit, additional usage of staff PPE, and extensive cleaning of exam room while observing appropriate contact time as indicated for disinfecting solutions.    Signed Lamar Blinks, MD  Addendum 9/17, received her labs as below.  Message to patient  Results for orders placed or performed in visit on 63/87/56  Basic metabolic panel  Result Value Ref Range   Glucose, Bld 108 (H) 65 - 99 mg/dL   BUN 15 7 - 25 mg/dL  Creat 0.89 0.50 - 0.99 mg/dL   BUN/Creatinine Ratio NOT APPLICABLE 6 - 22 (calc)   Sodium 143 135 - 146 mmol/L   Potassium 4.0 3.5 - 5.3 mmol/L   Chloride 106 98 - 110 mmol/L   CO2 28 20 - 32 mmol/L   Calcium 9.7 8.6 - 10.4 mg/dL  Lipid panel  Result Value Ref Range   Cholesterol 176 <200 mg/dL   HDL 66 > OR = 50 mg/dL   Triglycerides 112 <150 mg/dL   LDL Cholesterol (Calc) 89 mg/dL (calc)   Total CHOL/HDL Ratio 2.7 <5.0 (calc)   Non-HDL Cholesterol (Calc) 110 <130 mg/dL (calc)

## 2020-04-25 ENCOUNTER — Telehealth: Payer: Self-pay

## 2020-04-25 DIAGNOSIS — E782 Mixed hyperlipidemia: Secondary | ICD-10-CM

## 2020-04-25 NOTE — Telephone Encounter (Signed)
Called and lmomed the pt to schedule lipid labs

## 2020-04-25 NOTE — Telephone Encounter (Signed)
-----   Message from Ramond Dial, [place] sent at 04/25/2020  4:03 PM EDT -----  ----- Message ----- From: Ramond Dial, RPH-CPP Sent: 04/20/2020 To: Ramond Dial, RPH-CPP   ----- Message ----- From: Ramond Dial, RPH-CPP Sent: 04/04/2020 To: Ramond Dial, RPH-CPP  Set up lipids labs

## 2020-04-27 ENCOUNTER — Ambulatory Visit (INDEPENDENT_AMBULATORY_CARE_PROVIDER_SITE_OTHER): Payer: 59 | Admitting: Family Medicine

## 2020-04-27 ENCOUNTER — Encounter: Payer: Self-pay | Admitting: Family Medicine

## 2020-04-27 ENCOUNTER — Other Ambulatory Visit: Payer: Self-pay

## 2020-04-27 VITALS — BP 142/90 | HR 73 | Resp 16 | Ht 67.0 in | Wt 186.0 lb

## 2020-04-27 DIAGNOSIS — R1011 Right upper quadrant pain: Secondary | ICD-10-CM | POA: Diagnosis not present

## 2020-04-27 DIAGNOSIS — I10 Essential (primary) hypertension: Secondary | ICD-10-CM | POA: Diagnosis not present

## 2020-04-27 DIAGNOSIS — E785 Hyperlipidemia, unspecified: Secondary | ICD-10-CM

## 2020-04-27 MED ORDER — LISINOPRIL-HYDROCHLOROTHIAZIDE 20-12.5 MG PO TABS: 2.0000 | ORAL_TABLET | Freq: Every day | ORAL | 3 refills | Status: DC

## 2020-04-27 NOTE — Patient Instructions (Signed)
Good to see you again today I would strongly encourage you to get a flu shot this fall!!   We will get an ultrasound of your liver and gallbladder to evaluate the pain you have noticed Continue hydralazine 50 three times a day for BP We are going to change your lisinopril and hydrochlorothiazide to a combination pill that has both these medications in one.  Take 2 daily- ok to take both in the am in you like  Please see me in about one month to check on your BP We are checking your cholesterol today- I will forward to your cholesterol doctor

## 2020-04-28 ENCOUNTER — Encounter: Payer: Self-pay | Admitting: Family Medicine

## 2020-04-28 LAB — LIPID PANEL
Cholesterol: 176 mg/dL (ref <200)
HDL: 66 mg/dL (ref >=50)
LDL Cholesterol (Calc): 89 mg/dL (calc)
Non-HDL Cholesterol (Calc): 110 mg/dL (calc) (ref <130)
Total CHOL/HDL Ratio: 2.7 (calc) (ref <5.0)
Triglycerides: 112 mg/dL (ref <150)

## 2020-04-28 LAB — BASIC METABOLIC PANEL WITH GFR
BUN: 15 mg/dL (ref 7–25)
CO2: 28 mmol/L (ref 20–32)
Calcium: 9.7 mg/dL (ref 8.6–10.4)
Chloride: 106 mmol/L (ref 98–110)
Creat: 0.89 mg/dL (ref 0.50–0.99)
Glucose, Bld: 108 mg/dL — ABNORMAL HIGH (ref 65–99)
Potassium: 4 mmol/L (ref 3.5–5.3)
Sodium: 143 mmol/L (ref 135–146)

## 2020-05-03 ENCOUNTER — Ambulatory Visit (HOSPITAL_BASED_OUTPATIENT_CLINIC_OR_DEPARTMENT_OTHER)
Admission: RE | Admit: 2020-05-03 | Discharge: 2020-05-03 | Disposition: A | Discharge status: Home / Self Care | Payer: 59 | Source: Ambulatory Visit | Attending: Family Medicine | Admitting: Family Medicine

## 2020-05-03 ENCOUNTER — Encounter: Payer: Self-pay | Admitting: Family Medicine

## 2020-05-03 ENCOUNTER — Other Ambulatory Visit: Payer: Self-pay

## 2020-05-03 DIAGNOSIS — R1011 Right upper quadrant pain: Secondary | ICD-10-CM | POA: Diagnosis present

## 2020-05-05 ENCOUNTER — Encounter: Payer: Self-pay | Admitting: Family Medicine

## 2020-05-16 NOTE — Progress Notes (Signed)
Cardiology Office Note    Date:  47/11/2593   ID:  [name], DOB 08-24-1955, MRN [id number]  PCP:  Darreld Mclean, MD  Cardiologist: Ena Dawley, MD EPS: None  No chief complaint on file.   History of Present Illness:  [name] is a 64 y.o. 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 Coronary CTA 11/11/17 Calcium score 0 and normal coronary arteries.    I saw the patient 12/08/2019 2 days after rotator cuff repair and she was in quite a bit of pain.  Blood pressure was running high.  She was also having some epigastric pain that improved with Protonix.  She had stopped Crestor because of body aches and nausea.  Also stopped Xarelto for surgery and had not restarted it.  I added HCTZ 25 mg once daily and asked her to restart her Xarelto and referred her to lipid clinic.   I have been seeing the patient for elevated blood pressures but she has had confusion over her meds and was not taking hydralazine as prescribed at office visit 02/02/2020.  Last office visit 02/23/2020 I increased her hydralazine to 50 mg 3 times daily.  PCP increased her lisinopril HCTZ to 2 tablets daily.  He was complaining of chronic leg pain worse with standing in the same position at work.  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  2D echo 03/22/2020 normal LVEF 55 to 60% with grade 1 DD   Was discharged 03/22/2020 with gastritis versus hypertensive encephalopathy presenting with nausea vomiting abdominal pain.  Blood pressure 180/90.  MRI was negative for stroke MRA unremarkable echo normal LV function LDL 97.  She did have Covid 02/2020  Patient comes in for f/u. BP doing better but still going up in the evening. Complains of headaches and blurred vision and dizziness at times in the evening when sitting or lying down. Usually BP  up.    Past Medical History:  Diagnosis Date  . Bilateral leg pain 07/02/2016  . Cancer ([place]) 08/03/2010   colon  . 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 History of colon cancer, stage I 08/17/2015  . HLD (hyperlipidemia)   . Hypertension   . Prediabetes 05/25/2019  . Uterine fibroid   . Varicose 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 08/26/2014    Past Surgical History:  Procedure Laterality Date  . ABDOMINAL HYSTERECTOMY    . CHOLECYSTECTOMY    . COLON SURGERY    . GALLBLADDER SURGERY    . MEDIASTERNOTOMY N/A 11/24/2017   Procedure: PARTIAL STERNOTOMY;  Surgeon: [name], MD;  Location: [facility];  Service: Thoracic;  Laterality: N/A;  . THYMECTOMY  11/24/2017   Procedure: THYMECTOMY;  Surgeon: [name], MD;  Location: [facility] OR;  Service: Thoracic;;    Current Medications: Current Meds  Medication Sig  . Evolocumab (REPATHA SURECLICK) 295 MG/ML SOAJ Inject 1 pen into the skin every 14 (fourteen) days.  . hydrALAZINE (APRESOLINE) 50 MG tablet 1 tablet 3 times daily; extra tablet as needed  . rivaroxaban (XARELTO) 20 MG TABS tablet TAKE 1 TABLET BY MOUTH DAILY WITH SUPPER (Patient taking differently: Take 20 mg by mouth daily with breakfast. )  . [DISCONTINUED] hydrALAZINE (APRESOLINE) 50 MG tablet Take 50 mg by mouth 3 (three) times daily.  . [  DISCONTINUED] lisinopril-hydrochlorothiazide (ZESTORETIC) 20-12.5 MG tablet Take 2 tablets by mouth daily.  Marland Kitchen lisinopril-hydrochlorothiazide (ZESTORETIC) 20-12.5 MG tablet Take 1 tablet by mouth 2 (two) times daily.     Allergies:   Patient has no known allergies.   Social History   Socioeconomic History  . Marital status: Married    Spouse name: Not on file  . Number of children: 1  . Years of education: Bachelors  . Highest education level: Not on file  Occupational History  . Not on file  Tobacco Use  . Smoking status: Never Smoker  . Smokeless  tobacco: Never Used  Vaping Use  . Vaping Use: Never used  Substance and Sexual Activity  . Alcohol use: No    Alcohol/week: 0.0 standard drinks  . Drug use: No  . Sexual activity: Not on file  Other Topics Concern  . Not on file  Social History Narrative   Lives at home with husband and son.   Right-handed.   No caffeine use.   Social Determinants of Health   Financial Resource Strain:   . Difficulty of Paying Living Expenses: Not on file  Food Insecurity:   . Worried About Charity fundraiser in the Last Year: Not on file  . Ran Out of Food in the Last Year: Not on file  Transportation Needs:   . Lack of Transportation (Medical): Not on file  . Lack of Transportation (Non-Medical): Not on file  Physical Activity:   . Days of Exercise per Week: Not on file  . Minutes of Exercise per Session: Not on file  Stress:   . Feeling of Stress : Not on file  Social Connections:   . Frequency of Communication with Friends and Family: Not on file  . Frequency of Social Gatherings with Friends and Family: Not on file  . Attends Religious Services: Not on file  . Active Member of Clubs or Organizations: Not on file  . Attends Archivist Meetings: Not on file  . Marital Status: Not on file     Family History:  The patient's   family history includes Heart attack in her mother; Hypertension in her father.   ROS:   Please see the history of present illness.    ROS All other systems reviewed and are negative.   PHYSICAL EXAM:   VS:  BP (!) 144/62   Pulse 80   Ht 5\' 7"  (1.702 m)   Wt 188 lb 3.2 oz (85.4 kg)   SpO2 97%   BMI 29.48 kg/m   Physical Exam  GEN: Well nourished, well developed, in no acute distress  Neck: no JVD, carotid bruits, or masses Cardiac:RRR; no murmurs, rubs, or gallops  Respiratory:  clear to auscultation bilaterally, normal work of breathing GI: soft, nontender, nondistended, + BS Ext: without cyanosis, clubbing, or edema, Good distal pulses  bilaterally Neuro:  Alert and Oriented x 3 Psych: euthymic mood, full affect  Wt Readings from Last 3 Encounters:  05/17/20 188 lb 3.2 oz (85.4 kg)  04/27/20 186 lb (84.4 kg)  04/04/20 191 lb 2.2 oz (86.7 kg)      Studies/Labs Reviewed:   EKG:  EKG is not ordered today.    Recent Labs: 11/22/2019: TSH 2.09 04/04/2020: Hemoglobin 13.1; Platelets 171 04/07/2020: ALT 16 04/27/2020: BUN 15; Creat 0.89; Potassium 4.0; Sodium 143   Lipid Panel    Component Value Date/Time   CHOL 176 04/27/2020 1047   TRIG 112 04/27/2020 1047   HDL 66  04/27/2020 1047   CHOLHDL 2.7 04/27/2020 1047   VLDL 26 03/22/2020 0434   LDLCALC 89 04/27/2020 1047    Additional studies/ records that were reviewed today include:  2D echo 03/22/20  IMPRESSIONS     1. Left ventricular ejection fraction, by estimation, is 55 to 60%. The  left ventricle has normal function. The left ventricle has no regional  wall motion abnormalities. Left ventricular diastolic parameters are  consistent with Grade I diastolic  dysfunction (impaired relaxation).   2. Right ventricular systolic function is normal. The right ventricular  size is normal. Tricuspid regurgitation signal is inadequate for assessing  PA pressure.   3. The mitral valve is normal in structure. No evidence of mitral valve  regurgitation. No evidence of mitral stenosis.   4. The aortic valve is tricuspid. Aortic valve regurgitation is not  visualized. No aortic stenosis is present.   5. The inferior vena cava is normal in size with greater than 50%  respiratory variability, suggesting right atrial pressure of 3 mmHg.   FINDINGS   Left Ventricle: Left ventricular ejection fraction, by estimation, is 55  to 60%. The left ventricle has normal function. The left ventricle has no  regional wall motion abnormalities. The left ventricular internal cavity  size was normal in size. There is   no left ventricular hypertrophy. Left ventricular diastolic  parameters  are consistent with Grade I diastolic dysfunction (impaired relaxation).   Right Ventricle: The right ventricular size is normal. No increase in  right ventricular wall thickness. Right ventricular systolic function is  normal. Tricuspid regurgitation signal is inadequate for assessing PA  pressure.   Left Atrium: Left atrial size was normal in size.   Right Atrium: Right atrial size was normal in size.   Pericardium: There is no evidence of pericardial effusion.   Mitral Valve: The mitral valve is normal in structure. No evidence of  mitral valve regurgitation. No evidence of mitral valve stenosis.   Tricuspid Valve: The tricuspid valve is normal in structure. Tricuspid  valve regurgitation is not demonstrated.   Aortic Valve: The aortic valve is tricuspid. Aortic valve regurgitation is  not visualized. No aortic stenosis is present.   Pulmonic Valve: The pulmonic valve was normal in structure. Pulmonic valve  regurgitation is not visualized.   Aorta: The aortic root is normal in size and structure.   Venous: The inferior vena cava is normal in size with greater than 50%  respiratory variability, suggesting right atrial pressure of 3 mmHg.   IAS/Shunts: No atrial level shunt detected by color flow Doppler.   Lower extremity Dopplers 02/2020 Summary:  RIGHT:  - Findings consistent with chronic deep vein thrombosis involving the  right peroneal veins.  - No cystic structure found in the popliteal fossa.  - All other veins visualized appear fully compressible and demonstrate  appropriate Doppler characteristics.    LEFT:  - Findings consistent with chronic deep vein thrombosis involving the left  peroneal veins.  - No cystic structure found in the popliteal fossa.  - All other veins visualized appear fully compressible and demonstrate  appropriate Doppler characteristics.    *See table(s) above for measurements and observations.   Electronically signed by  Ida Rogue MD on 04/11/2020 at 7:06:14 P       ASSESSMENT:    1. Essential hypertension   2. History of chest pain   3. History of recurrent deep vein thrombosis (DVT)   4. Hyperlipidemia, unspecified hyperlipidemia type  PLAN:  In order of problems listed above:  Essential hypertension blood pressure stable throughout the day but continues to go up in the evening according to the patient associated with headaches dizziness and blurred vision.  This happens approximately twice a week.  I told her she can take an extra hydralazine at those times.  She may need to increase hydralazine to 4 tablets daily.  She will let us know.  History of chest pain with coronary CTA 11/2017 calcium score 0 normal coronaries  History of recurrent DVT on Xarelto with chronic leg pain and recent Dopplers showing chronic peritoneal DVTs see above  HLD on Repatha    Medication Adjustments/Labs and Tests Ordered: Current medicines are reviewed at length with the patient today.  Concerns regarding medicines are outlined above.  Medication changes, Labs and Tests ordered today are listed in the Patient Instructions below. There are no Patient Instructions on file for this visit.   Signed, Ermalinda Barrios, PA-C  05/17/2020 1:12 PM    [facility] [address], [place], [place]  [address] Phone: [phone]; Fax: [phone]

## 2020-05-17 ENCOUNTER — Encounter: Payer: Self-pay | Admitting: Physician Assistant

## 2020-05-17 ENCOUNTER — Ambulatory Visit (INDEPENDENT_AMBULATORY_CARE_PROVIDER_SITE_OTHER): Payer: 59 | Admitting: Physician Assistant

## 2020-05-17 ENCOUNTER — Other Ambulatory Visit: Payer: Self-pay

## 2020-05-17 VITALS — BP 144/62 | HR 80 | Ht 67.0 in | Wt 188.2 lb

## 2020-05-17 DIAGNOSIS — Z87898 Personal history of other specified conditions: Secondary | ICD-10-CM

## 2020-05-17 DIAGNOSIS — Z86718 Personal history of other venous thrombosis and embolism: Secondary | ICD-10-CM | POA: Diagnosis not present

## 2020-05-17 DIAGNOSIS — E785 Hyperlipidemia, unspecified: Secondary | ICD-10-CM

## 2020-05-17 DIAGNOSIS — I10 Essential (primary) hypertension: Secondary | ICD-10-CM

## 2020-05-17 MED ORDER — LISINOPRIL-HYDROCHLOROTHIAZIDE 20-12.5 MG PO TABS: 1.0000 | ORAL_TABLET | Freq: Two times a day (BID) | ORAL | 3 refills | Status: DC

## 2020-05-17 MED ORDER — HYDRALAZINE HCL 50 MG PO TABS: ORAL_TABLET | ORAL | 3 refills | Status: DC

## 2020-05-17 NOTE — Patient Instructions (Signed)
Medication Instructions:  Your physician has recommended you make the following change in your medication:   [place] PM FOR INCREASED BLOOD PRESSURE  *If you need a refill on your cardiac medications before your next appointment, please call your pharmacy*   Lab Work: None If you have labs (blood work) drawn today and your tests are completely normal, you will receive your results only by:  [place] (if you have MyChart) OR  A paper copy in the mail If you have any lab test that is abnormal or we need to change your treatment, we will call you to review the results.   Testing/Procedures: None   Follow-Up: At [facility], you and your health needs are our priority.  As part of our continuing mission to provide you with exceptional heart care, we have created designated Provider Care Teams.  These Care Teams include your primary Cardiologist (physician) and Advanced Practice Providers (APPs -  Physician Assistants and Nurse Practitioners) who all work together to provide you with the care you need, when you need it.  Your next appointment:   08/30/2019   The format for your next appointment:   In Person  Provider:   Ermalinda Barrios, PA-C

## 2020-05-30 ENCOUNTER — Other Ambulatory Visit: Payer: Self-pay | Admitting: Family Medicine

## 2020-05-30 DIAGNOSIS — I10 Essential (primary) hypertension: Secondary | ICD-10-CM

## 2020-06-05 ENCOUNTER — Other Ambulatory Visit: Payer: Self-pay

## 2020-06-12 ENCOUNTER — Other Ambulatory Visit: Payer: Self-pay | Admitting: Family Medicine

## 2020-06-12 DIAGNOSIS — Z7901 Long term (current) use of anticoagulants: Secondary | ICD-10-CM

## 2020-07-26 ENCOUNTER — Other Ambulatory Visit: Payer: Self-pay

## 2020-07-26 ENCOUNTER — Ambulatory Visit (INDEPENDENT_AMBULATORY_CARE_PROVIDER_SITE_OTHER): Payer: 59 | Admitting: Cardiology

## 2020-07-26 ENCOUNTER — Encounter: Payer: Self-pay | Admitting: Cardiology

## 2020-07-26 VITALS — BP 172/90 | HR 60 | Ht 67.0 in | Wt 188.6 lb

## 2020-07-26 DIAGNOSIS — Z79899 Other long term (current) drug therapy: Secondary | ICD-10-CM | POA: Diagnosis not present

## 2020-07-26 DIAGNOSIS — I10 Essential (primary) hypertension: Secondary | ICD-10-CM

## 2020-07-26 DIAGNOSIS — I82403 Acute embolism and thrombosis of unspecified deep veins of lower extremity, bilateral: Secondary | ICD-10-CM | POA: Diagnosis not present

## 2020-07-26 DIAGNOSIS — E782 Mixed hyperlipidemia: Secondary | ICD-10-CM | POA: Diagnosis not present

## 2020-07-26 MED ORDER — PANTOPRAZOLE SODIUM 20 MG PO TBEC: 20.0000 mg | DELAYED_RELEASE_TABLET | Freq: Every day | ORAL | Status: DC

## 2020-07-26 MED ORDER — AMLODIPINE BESYLATE 2.5 MG PO TABS: 2.5000 mg | ORAL_TABLET | Freq: Every day | ORAL | 1 refills | Status: DC

## 2020-07-26 MED ORDER — SPIRONOLACTONE 25 MG PO TABS: 12.5000 mg | ORAL_TABLET | ORAL | 1 refills | Status: DC

## 2020-07-26 MED ORDER — LISINOPRIL 40 MG PO TABS: 40.0000 mg | ORAL_TABLET | ORAL | 1 refills | Status: DC

## 2020-07-26 NOTE — Patient Instructions (Signed)
Medication Instructions:   STOP TAKING HYDRALAZINE NOW  STOP TAKING LISINOPRIL/HCTZ NOW  START TAKING LISINOPRIL 40 MG BY MOUTH DAILY EVERY MORNING  START TAKING SPIRONOLACTONE 12.5 MG BY MOUTH DAILY EVERY MORNING  START TAKING AMLODIPINE 2.5 MG BY MOUTH DAILY AT BEDTIME  PURCHASE OTC PROTONIX 20 MG BY MOUTH DAILY FOR 2 WEEKS ONLY  *If you need a refill on your cardiac medications before your next appointment, please call your pharmacy*    Follow-Up:  [place] ON September 28, 2020 AT ANY OPEN SLOT    Other Instructions  PLEASE RECORD YOUR BLOOD PRESSURES DAILY AND SEND [place] YOUR [place] IN THE NEXT WEEK OR 2.

## 2020-07-26 NOTE — Progress Notes (Signed)
Cardiology Office Note    Date:  02/40/9735   ID:  [name], DOB 06-Jun-1956, MRN [id number]  PCP:  Darreld Mclean, MD  Cardiologist: Ena Dawley, MD EPS: None  Chief complaint: Chest pain and hypertension  History of Present Illness:  [name] is a 64 y.o. 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 Coronary CTA 11/11/17 Calcium score 0 and normal coronary arteries. 2D echo 03/22/2020 normal LVEF 55 to 60% with grade 1 DD   Was discharged 03/22/2020 with gastritis versus hypertensive encephalopathy presenting with nausea vomiting abdominal pain.  Blood pressure 180/90.  MRI was negative for stroke MRA unremarkable echo normal LV function LDL 97.  She did have Covid 02/2020.  The patient has had elevated blood pressure ever since she had Covid and has also been experiencing nonexertional chest pressure that radiates to her neck.  This can wake her up at night, she does not seem is related to any food intake and not related to exertion.  She has no lower extremity edema orthopnea proximal nocturnal dyspnea.  Past Medical History:  Diagnosis Date  . Bilateral leg pain 07/02/2016  . Cancer ([place]) 08/03/2010   colon  . 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 History of colon cancer, stage I 08/17/2015  . HLD (hyperlipidemia)   . Hypertension   . Prediabetes 05/25/2019  . Uterine fibroid   . Varicose 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 08/26/2014    Past Surgical History:  Procedure Laterality Date  . ABDOMINAL HYSTERECTOMY    . CHOLECYSTECTOMY    . COLON SURGERY    . GALLBLADDER SURGERY    . MEDIASTERNOTOMY N/A 11/24/2017   Procedure: PARTIAL STERNOTOMY;  Surgeon: [name], MD;  Location: [facility];  Service: Thoracic;  Laterality: N/A;  . THYMECTOMY  11/24/2017   Procedure:  THYMECTOMY;  Surgeon: [name], MD;  Location: [facility] OR;  Service: Thoracic;;   Current Medications: Current Meds  Medication Sig  . Evolocumab (REPATHA SURECLICK) 341 MG/ML SOAJ Inject 1 pen into the skin every 14 (fourteen) days.  . rivaroxaban (XARELTO) 20 MG TABS tablet Take 1 tablet (20 mg total) by mouth daily with supper.  . [DISCONTINUED] hydrALAZINE (APRESOLINE) 50 MG tablet 1 tablet 3 times daily; extra tablet as needed  . [DISCONTINUED] lisinopril-hydrochlorothiazide (ZESTORETIC) 20-12.5 MG tablet Take 1 tablet by mouth 2 (two) times daily.    Allergies:   Patient has no known allergies.   Social History   Socioeconomic History  . Marital status: Married    Spouse name: Not on file  . Number of children: 1  . Years of education: Bachelors  . Highest education level: Not on file  Occupational History  . Not on file  Tobacco Use  . Smoking status: Never Smoker  . Smokeless tobacco: Never Used  Vaping Use  . Vaping Use: Never used  Substance and Sexual Activity  . Alcohol use: No    Alcohol/week: 0.0 standard drinks  . Drug use: No  . Sexual activity: Not on file  Other Topics Concern  . Not on file  Social History Narrative   Lives at home with husband and son.   Right-handed.   No caffeine use.   Social Determinants of Health   Financial Resource Strain: Not on file  Food Insecurity:  Not on file  Transportation Needs: Not on file  Physical Activity: Not on file  Stress: Not on file  Social Connections: Not on file    Family History:  The patient's   family history includes Heart attack in her mother; Hypertension in her father.   ROS:   Please see the history of present illness.    ROS All other system reviewed and are negative.  PHYSICAL EXAM:   VS:  BP (!) 172/90   Pulse 60   Ht 5\' 7"  (1.702 m)   Wt 188 lb 9.6 oz (85.5 kg)   SpO2 98%   BMI 29.54 kg/m   Physical Exam  GEN: Well nourished, well developed, in no acute distress  Neck:  no JVD, carotid bruits, or masses Cardiac:RRR; no murmurs, rubs, or gallops  Respiratory:  clear to auscultation bilaterally, normal work of breathing GI: soft, nontender, nondistended, + BS Ext: without cyanosis, clubbing, or edema, Good distal pulses bilaterally Neuro:  Alert and Oriented x 3 Psych: euthymic mood, full affect  Wt Readings from Last 3 Encounters:  07/26/20 188 lb 9.6 oz (85.5 kg)  05/17/20 188 lb 3.2 oz (85.4 kg)  04/27/20 186 lb (84.4 kg)    Studies/Labs Reviewed:   EKG:  EKG is ordered today.   It shows sinus bradycardia 56 bpm, normal EKG and unchanged from prior.  Recent Labs: 11/22/2019: TSH 2.09 04/04/2020: Hemoglobin 13.1; Platelets 171 04/07/2020: ALT 16 04/27/2020: BUN 15; Creat 0.89; Potassium 4.0; Sodium 143   Lipid Panel    Component Value Date/Time   CHOL 176 04/27/2020 1047   TRIG 112 04/27/2020 1047   HDL 66 04/27/2020 1047   CHOLHDL 2.7 04/27/2020 1047   VLDL 26 03/22/2020 0434   LDLCALC 89 04/27/2020 1047   Additional studies/ records that were reviewed today include:  2D echo 03/22/20  IMPRESSIONS   1. Left ventricular ejection fraction, by estimation, is 55 to 60%. The  left ventricle has normal function. The left ventricle has no regional  wall motion abnormalities. Left ventricular diastolic parameters are  consistent with Grade I diastolic  dysfunction (impaired relaxation).   2. Right ventricular systolic function is normal. The right ventricular  size is normal. Tricuspid regurgitation signal is inadequate for assessing  PA pressure.   3. The mitral valve is normal in structure. No evidence of mitral valve  regurgitation. No evidence of mitral stenosis.   4. The aortic valve is tricuspid. Aortic valve regurgitation is not  visualized. No aortic stenosis is present.   5. The inferior vena cava is normal in size with greater than 50%  respiratory variability, suggesting right atrial pressure of 3 mmHg.   FINDINGS   Left  Ventricle: Left ventricular ejection fraction, by estimation, is 55  to 60%. The left ventricle has normal function. The left ventricle has no  regional wall motion abnormalities. The left ventricular internal cavity  size was normal in size. There is   no left ventricular hypertrophy. Left ventricular diastolic parameters  are consistent with Grade I diastolic dysfunction (impaired relaxation).   Right Ventricle: The right ventricular size is normal. No increase in  right ventricular wall thickness. Right ventricular systolic function is  normal. Tricuspid regurgitation signal is inadequate for assessing PA  pressure.   Left Atrium: Left atrial size was normal in size.   Right Atrium: Right atrial size was normal in size.   Pericardium: There is no evidence of pericardial effusion.   Mitral Valve: The mitral valve is  normal in structure. No evidence of  mitral valve regurgitation. No evidence of mitral valve stenosis.   Tricuspid Valve: The tricuspid valve is normal in structure. Tricuspid  valve regurgitation is not demonstrated.   Aortic Valve: The aortic valve is tricuspid. Aortic valve regurgitation is  not visualized. No aortic stenosis is present.   Pulmonic Valve: The pulmonic valve was normal in structure. Pulmonic valve  regurgitation is not visualized.   Aorta: The aortic root is normal in size and structure.   Venous: The inferior vena cava is normal in size with greater than 50%  respiratory variability, suggesting right atrial pressure of 3 mmHg.   IAS/Shunts: No atrial level shunt detected by color flow Doppler.   Lower extremity Dopplers 02/2020 Summary:  RIGHT:  - Findings consistent with chronic deep vein thrombosis involving the  right peroneal veins.  - No cystic structure found in the popliteal fossa.  - All other veins visualized appear fully compressible and demonstrate  appropriate Doppler characteristics.    LEFT:  - Findings consistent with  chronic deep vein thrombosis involving the left  peroneal veins.  - No cystic structure found in the popliteal fossa.  - All other veins visualized appear fully compressible and demonstrate  appropriate Doppler characteristics.    *See table(s) above for measurements and observations.   Electronically signed by Ida Rogue MD on 04/11/2020 at 7:06:14 P     ASSESSMENT:    1. Essential hypertension   2. Medication management   3. Mixed hyperlipidemia   4. Recurrent acute deep vein thrombosis (DVT) of both lower extremities (HCC)    PLAN:  In order of problems listed above:  Essential hypertension -her blood pressure significantly elevated and I am suspicious that her chest pressure is related to that, I will DC her Zestoretic, start her on lisinopril 40 mg in the morning and spironolactone 12.5 mg in the mornings, and start her on amlodipine 2.5 mg at night.  I will discontinue hydralazine, she is advised about low-sodium diet, she is also instructed to start sending [place] blood pressure diary starting 1 week from now.  History of chest pain with coronary CTA 11/2017 calcium score 0 normal coronaries, she now has recurrent chest pain, I am suspicious that this is related to hypertension, the other option is a reflux and I will start her on a trial of Protonix.  Her EKG today is normal and unchanged from prior.  History of recurrent DVT on Xarelto with chronic leg pain and recent Dopplers showing chronic peritoneal DVTs see above  HLD on Repatha, that is well tolerated.  Her most recent lipids where LDL of 89 in triglycerides 112, HDL of 66.  Medication Adjustments/Labs and Tests Ordered: Current medicines are reviewed at length with the patient today.  Concerns regarding medicines are outlined above.  Medication changes, Labs and Tests ordered today are listed in the Patient Instructions below. Patient Instructions  Medication Instructions:   STOP TAKING HYDRALAZINE NOW  STOP TAKING  LISINOPRIL/HCTZ NOW  START TAKING LISINOPRIL 40 MG BY MOUTH DAILY EVERY MORNING  START TAKING SPIRONOLACTONE 12.5 MG BY MOUTH DAILY EVERY MORNING  START TAKING AMLODIPINE 2.5 MG BY MOUTH DAILY AT BEDTIME  PURCHASE OTC PROTONIX 20 MG BY MOUTH DAILY FOR 2 WEEKS ONLY  *If you need a refill on your cardiac medications before your next appointment, please call your pharmacy*    Follow-Up:  IN PERSON WITH DR. Meda Coffee ON September 28, 2020 AT ANY OPEN SLOT  Other Instructions  PLEASE RECORD YOUR BLOOD PRESSURES DAILY AND SEND [place] YOUR READINGS THROUGH [place] IN THE NEXT WEEK OR 2.      Signed, Ena Dawley, MD  07/26/2020 12:35 PM    [address], [place], [place]  [address] Phone: [phone]; Fax: [phone]

## 2020-07-27 ENCOUNTER — Telehealth: Payer: Self-pay

## 2020-07-27 MED ORDER — PANTOPRAZOLE SODIUM 20 MG PO TBEC: 20.0000 mg | DELAYED_RELEASE_TABLET | Freq: Every day | ORAL | 0 refills | Status: DC

## 2020-07-27 NOTE — Telephone Encounter (Signed)
Protonix 20 mg po daily take for 2 weeks only was sent into the pts pharmacy on file.

## 2020-07-27 NOTE — Telephone Encounter (Signed)
[facility] pharmacy is requesting for an Rx for pantoprazole 20 mg tablet be sent into their pharmacy, because pt's insurance covers medication for $0 and it is not available OTC. Please address

## 2020-08-15 ENCOUNTER — Other Ambulatory Visit: Payer: Self-pay | Admitting: Cardiology

## 2020-08-22 ENCOUNTER — Other Ambulatory Visit: Payer: Self-pay

## 2020-08-22 ENCOUNTER — Telehealth: Payer: Self-pay

## 2020-08-22 NOTE — Telephone Encounter (Signed)
**Note De-Identified [place] Obfuscation** I started a Spironolactone PA through [employer]ermymeds: Key: [id number]  I received the following message: Rosina Hypolite KeyFranz Dell - Rx #: [id number] Outcome: Your PA request cannot be processed for the member plan submitted. For further inquiries please contact the number on the back of the member prescription card. (Message 1002) Drug Spironolactone 25MG  tablets Form [employer] Electronic PA Form (2017 NCPDP) Original Claim Info 75 PHARMACY: [facility].PRIOR AUTHORIZATION REQRD(PHARMACY HELP DESK [phone])

## 2020-08-22 NOTE — Telephone Encounter (Signed)
I called [facility] and was advised that the pt picked her Spironolactone Rx up on 1/7 and that a PA was not required.

## 2020-08-23 NOTE — Progress Notes (Deleted)
Cardiology Office Note    Date:  10/03/9796   ID:  [name], DOB 21-Apr-1956, MRN [id number]  PCP:  Darreld Mclean, MD  Cardiologist: Ena Dawley, MD EPS: None  No chief complaint on file.   History of Present Illness:  [name] is a 65 y.o. 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 Coronary CTA 11/11/17 Calcium score 0 and normal coronary arteries. 2D echo 03/22/2020 normal LVEF 55 to 60% with grade 1 DD   Was discharged 03/22/2020 with gastritis versus hypertensive encephalopathy presenting with nausea vomiting abdominal pain.  Blood pressure 180/90.  MRI was negative for stroke MRA unremarkable echo normal LV function LDL 97.  She did have Covid 02/2020.  The patient has had elevated blood pressure ever since she had Covid  The patient saw Dr. Meda Coffee 07/26/2020 which time her blood pressure was significantly elevated and she was having blood pressure that she felt was related to it.  She stopped her Zestoretic and start her on lisinopril 40 mg in the morning and spironolactone 12.5 mg in the morning and amlodipine 2.5 mg at night.  She also stopped hydralazine and asked her to keep a blood pressure diary.  EKG was normal and with coronary CTA in 11/2017 calcium score was 0 with normal coronaries she felt her chest pain was related to her hypertension.  She also gave her a trial of Protonix.    Past Medical History:  Diagnosis Date  . Bilateral leg pain 07/02/2016  . Cancer ([place]) 08/03/2010   colon  . 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 History of colon cancer, stage I 08/17/2015  . HLD (hyperlipidemia)   . Hypertension   . Prediabetes 05/25/2019  . Uterine fibroid   . Varicose 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 08/26/2014    Past Surgical History:  Procedure Laterality Date  .  ABDOMINAL HYSTERECTOMY    . CHOLECYSTECTOMY    . COLON SURGERY    . GALLBLADDER SURGERY    . MEDIASTERNOTOMY N/A 11/24/2017   Procedure: PARTIAL STERNOTOMY;  Surgeon: [name], MD;  Location: [facility];  Service: Thoracic;  Laterality: N/A;  . THYMECTOMY  11/24/2017   Procedure: THYMECTOMY;  Surgeon: [name], MD;  Location: [facility];  Service: Thoracic;;    Current Medications: No outpatient medications have been marked as taking for the 08/29/20 encounter (Appointment) with [name], PA-C.     Allergies:   Patient has no known allergies.   Social History   Socioeconomic History  . Marital status: Married    Spouse name: Not on file  . Number of children: 1  . Years of education: Bachelors  . Highest education level: Not on file  Occupational History  . Not on file  Tobacco Use  . Smoking status: Never Smoker  . Smokeless tobacco: Never Used  Vaping Use  . Vaping Use: Never used  Substance and Sexual Activity  . Alcohol use: No    Alcohol/week: 0.0 standard drinks  . Drug use: No  . Sexual activity: Not on file  Other Topics Concern  . Not on file  Social History Narrative   Lives at home with husband and son.   Right-handed.   No caffeine use.   Social Determinants of Health   Financial Resource Strain:  Not on file  Food Insecurity: Not on file  Transportation Needs: Not on file  Physical Activity: Not on file  Stress: Not on file  Social Connections: Not on file     Family History:  The patient's ***family history includes Heart attack in her mother; Hypertension in her father.   ROS:   Please see the history of present illness.    ROS All other systems reviewed and are negative.   PHYSICAL EXAM:   VS:  There were no vitals taken for this visit.  Physical Exam  GEN: Well nourished, well developed, in no acute distress  HEENT: normal  Neck: no JVD, carotid bruits, or masses Cardiac:RRR; no murmurs, rubs, or gallops   Respiratory:  clear to auscultation bilaterally, normal work of breathing GI: soft, nontender, nondistended, + BS Ext: without cyanosis, clubbing, or edema, Good distal pulses bilaterally MS: no deformity or atrophy  Skin: warm and dry, no rash Neuro:  Alert and Oriented x 3, Strength and sensation are intact Psych: euthymic mood, full affect  Wt Readings from Last 3 Encounters:  07/26/20 188 lb 9.6 oz (85.5 kg)  05/17/20 188 lb 3.2 oz (85.4 kg)  04/27/20 186 lb (84.4 kg)      Studies/Labs Reviewed:   EKG:  EKG is*** ordered today.  The ekg ordered today demonstrates ***  Recent Labs: 11/22/2019: TSH 2.09 04/04/2020: Hemoglobin 13.1; Platelets 171 04/07/2020: ALT 16 04/27/2020: BUN 15; Creat 0.89; Potassium 4.0; Sodium 143   Lipid Panel    Component Value Date/Time   CHOL 176 04/27/2020 1047   TRIG 112 04/27/2020 1047   HDL 66 04/27/2020 1047   CHOLHDL 2.7 04/27/2020 1047   VLDL 26 03/22/2020 0434   LDLCALC 89 04/27/2020 1047    Additional studies/ records that were reviewed today include:  2D echo 03/22/20  IMPRESSIONS     1. Left ventricular ejection fraction, by estimation, is 55 to 60%. The  left ventricle has normal function. The left ventricle has no regional  wall motion abnormalities. Left ventricular diastolic parameters are  consistent with Grade I diastolic  dysfunction (impaired relaxation).   2. Right ventricular systolic function is normal. The right ventricular  size is normal. Tricuspid regurgitation signal is inadequate for assessing  PA pressure.   3. The mitral valve is normal in structure. No evidence of mitral valve  regurgitation. No evidence of mitral stenosis.   4. The aortic valve is tricuspid. Aortic valve regurgitation is not  visualized. No aortic stenosis is present.   5. The inferior vena cava is normal in size with greater than 50%  respiratory variability, suggesting right atrial pressure of 3 mmHg.   FINDINGS   Left Ventricle:  Left ventricular ejection fraction, by estimation, is 55  to 60%. The left ventricle has normal function. The left ventricle has no  regional wall motion abnormalities. The left ventricular internal cavity  size was normal in size. There is   no left ventricular hypertrophy. Left ventricular diastolic parameters  are consistent with Grade I diastolic dysfunction (impaired relaxation).   Right Ventricle: The right ventricular size is normal. No increase in  right ventricular wall thickness. Right ventricular systolic function is  normal. Tricuspid regurgitation signal is inadequate for assessing PA  pressure.   Left Atrium: Left atrial size was normal in size.   Right Atrium: Right atrial size was normal in size.   Pericardium: There is no evidence of pericardial effusion.   Mitral Valve: The mitral valve is normal  in structure. No evidence of  mitral valve regurgitation. No evidence of mitral valve stenosis.   Tricuspid Valve: The tricuspid valve is normal in structure. Tricuspid  valve regurgitation is not demonstrated.   Aortic Valve: The aortic valve is tricuspid. Aortic valve regurgitation is  not visualized. No aortic stenosis is present.   Pulmonic Valve: The pulmonic valve was normal in structure. Pulmonic valve  regurgitation is not visualized.   Aorta: The aortic root is normal in size and structure.   Venous: The inferior vena cava is normal in size with greater than 50%  respiratory variability, suggesting right atrial pressure of 3 mmHg.   IAS/Shunts: No atrial level shunt detected by color flow Doppler.    Lower extremity Dopplers 02/2020 Summary:  RIGHT:  - Findings consistent with chronic deep vein thrombosis involving the  right peroneal veins.  - No cystic structure found in the popliteal fossa.  - All other veins visualized appear fully compressible and demonstrate  appropriate Doppler characteristics.    LEFT:  - Findings consistent with chronic deep  vein thrombosis involving the left  peroneal veins.  - No cystic structure found in the popliteal fossa.  - All other veins visualized appear fully compressible and demonstrate  appropriate Doppler characteristics.    *See table(s) above for measurements and observations.   Electronically signed by Ida Rogue MD on 04/11/2020 at [place] Assessment/Calculations:   {Does this patient have ATRIAL FIBRILLATION?:[id number]}     ASSESSMENT:    1. Essential hypertension   2. Chest pain, unspecified type   3. History of recurrent deep vein thrombosis (DVT)   4. Hyperlipidemia, unspecified hyperlipidemia type      PLAN:  In order of problems listed above:  Essential hypertension blood pressure has been up since she had COVID back in July.  Dr. Meda Coffee changed her medicines in December and she is now on lisinopril 40 mg in the morning spironolactone 12.5 mg every morning amlodipine two-point 5 in the evening.  She stopped her hydralazine and Zestoretic.  Chest pain with history of coronary CTA 11/2017 calcium score 0 normal coronary arteries.  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 stress test, cath, DCCV, TEE, etc)?   Press F2        :[id number]}    Medication Adjustments/Labs and Tests Ordered: Current medicines are reviewed at length with the patient today.  Concerns regarding medicines are outlined above.  Medication changes, Labs and Tests ordered today are listed in the Patient Instructions below. There are no Patient Instructions on file for this visit.   Sumner Boast, PA-C  08/23/2020 2:38 PM    [facility] [place], [place], [place]  [address] Phone: [phone]; Fax: [phone]

## 2020-08-29 ENCOUNTER — Ambulatory Visit: Payer: 59 | Admitting: Physician Assistant

## 2020-08-29 DIAGNOSIS — Z86718 Personal history of other venous thrombosis and embolism: Secondary | ICD-10-CM

## 2020-08-29 DIAGNOSIS — R079 Chest pain, unspecified: Secondary | ICD-10-CM

## 2020-08-29 DIAGNOSIS — I10 Essential (primary) hypertension: Secondary | ICD-10-CM

## 2020-08-29 DIAGNOSIS — E785 Hyperlipidemia, unspecified: Secondary | ICD-10-CM

## 2020-09-28 ENCOUNTER — Other Ambulatory Visit: Payer: Self-pay

## 2020-09-28 ENCOUNTER — Encounter: Payer: Self-pay | Admitting: Cardiology

## 2020-09-28 ENCOUNTER — Telehealth: Payer: Self-pay | Admitting: Pharmacist

## 2020-09-28 ENCOUNTER — Ambulatory Visit (INDEPENDENT_AMBULATORY_CARE_PROVIDER_SITE_OTHER): Payer: 59 | Admitting: Cardiology

## 2020-09-28 VITALS — BP 140/80 | HR 69 | Ht 67.0 in | Wt 196.0 lb

## 2020-09-28 DIAGNOSIS — E782 Mixed hyperlipidemia: Secondary | ICD-10-CM | POA: Diagnosis not present

## 2020-09-28 DIAGNOSIS — I82403 Acute embolism and thrombosis of unspecified deep veins of lower extremity, bilateral: Secondary | ICD-10-CM

## 2020-09-28 DIAGNOSIS — I10 Essential (primary) hypertension: Secondary | ICD-10-CM | POA: Diagnosis not present

## 2020-09-28 MED ORDER — AMLODIPINE BESYLATE 5 MG PO TABS: 5.0000 mg | ORAL_TABLET | Freq: Every day | ORAL | 3 refills | Status: DC

## 2020-09-28 MED ORDER — SPIRONOLACTONE 25 MG PO TABS: 25.0000 mg | ORAL_TABLET | ORAL | 3 refills | Status: DC

## 2020-09-28 NOTE — Telephone Encounter (Signed)
Thank you :)

## 2020-09-28 NOTE — Progress Notes (Signed)
Cardiology Office Note    Date:  9/56/3875   ID:  [name], DOB 1956-07-02, MRN [id number]  PCP:  Darreld Mclean, MD  Cardiologist: Ena Dawley, MD EPS: None  Reason for visit: Follow-up for hypertension  History of Present Illness:  [name] is a 65 y.o. 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 Coronary CTA 11/11/17 Calcium score 0 and normal coronary arteries. 2D echo 03/22/2020 normal LVEF 55 to 60% with grade 1 DD   07/26/2020  -the patient is coming after she was discharged 03/22/2020 with gastritis versus hypertensive encephalopathy presenting with nausea vomiting abdominal pain.  Blood pressure 180/90.  MRI was negative for stroke MRA unremarkable echo normal LV function LDL 97.  She did have Covid 02/2020.  The patient has had elevated blood pressure ever since she had Covid and has also been experiencing nonexertional chest pressure that radiates to her neck.  This can wake her up at night, she does not seem is related to any food intake and not related to exertion.  She has no lower extremity edema orthopnea proximal nocturnal dyspnea.  09/28/2020 -she brings her blood pressure diary and her blood pressures are in 130s to 140s in the morning but they are in the 150s to 170s at night.  She feels chest pressure with elevated blood pressure.  Otherwise also has mild lower extremity edema especially in her right lower extremity where she had DVT.  She compliant with Xarelto and has no bleeding.  Past Medical History:  Diagnosis Date  . Bilateral leg pain 07/02/2016  . Cancer ([facility]) 08/03/2010   colon  . 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 History of colon cancer, stage I 08/17/2015  . HLD (hyperlipidemia)   . Hypertension   . Prediabetes 05/25/2019  . Uterine fibroid   . Varicose veins  of both lower extremities with pain 08/26/2014    Past Surgical History:  Procedure Laterality Date  . ABDOMINAL HYSTERECTOMY    . CHOLECYSTECTOMY    . COLON SURGERY    . GALLBLADDER SURGERY    . MEDIASTERNOTOMY N/A 11/24/2017   Procedure: PARTIAL STERNOTOMY;  Surgeon: [name], MD;  Location: [facility];  Service: Thoracic;  Laterality: N/A;  . THYMECTOMY  11/24/2017   Procedure: THYMECTOMY;  Surgeon: [name], MD;  Location: [facility] OR;  Service: Thoracic;;   Current Medications: Current Meds  Medication Sig  . lisinopril (ZESTRIL) 40 MG tablet Take 1 tablet (40 mg total) by mouth every morning.  . rivaroxaban (XARELTO) 20 MG TABS tablet Take 1 tablet (20 mg total) by mouth daily with supper.  . [DISCONTINUED] amLODipine (NORVASC) 2.5 MG tablet Take 1 tablet (2.5 mg total) by mouth at bedtime.  . [DISCONTINUED] spironolactone (ALDACTONE) 25 MG tablet Take 0.5 tablets (12.5 mg total) by mouth every morning.    Allergies:   Patient has no known allergies.   Social History   Socioeconomic History  . Marital status: Married    Spouse name: Not on file  . Number of children: 1  . Years of education: Bachelors  . Highest education level: Not on file  Occupational History  . Not on file  Tobacco Use  . Smoking status: Never Smoker  . Smokeless tobacco: Never Used  Vaping Use  . Vaping Use: Never used  Substance and Sexual Activity  . Alcohol use: No    Alcohol/week: 0.0 standard drinks  . Drug use: No  . Sexual activity: Not on file  Other Topics Concern  . Not on file  Social History Narrative   Lives at home with husband and son.   Right-handed.   No caffeine use.   Social Determinants of Health   Financial Resource Strain: Not on file  Food Insecurity: Not on file  Transportation Needs: Not on file  Physical Activity: Not on file  Stress: Not on file  Social Connections: Not on file    Family History:  The patient's   family history includes  Heart attack in her mother; Hypertension in her father.   ROS:   Please see the history of present illness.    ROS All other system reviewed and are negative.  PHYSICAL EXAM:   VS:  BP 140/80   Pulse 69   Ht 5\' 7"  (1.702 m)   Wt 196 lb (88.9 kg)   SpO2 95%   BMI 30.70 kg/m   Physical Exam  GEN: Well nourished, well developed, in no acute distress  Neck: no JVD, carotid bruits, or masses Cardiac:RRR; no murmurs, rubs, or gallops  Respiratory:  clear to auscultation bilaterally, normal work of breathing GI: soft, nontender, nondistended, + BS Ext: without cyanosis, clubbing, mild bilateral pedal edema, Good distal pulses bilaterally Neuro:  Alert and Oriented x 3 Psych: euthymic mood, full affect  Wt Readings from Last 3 Encounters:  09/28/20 196 lb (88.9 kg)  07/26/20 188 lb 9.6 oz (85.5 kg)  05/17/20 188 lb 3.2 oz (85.4 kg)    Studies/Labs Reviewed:   EKG:  EKG is not ordered today.   Recent Labs: 11/22/2019: TSH 2.09 04/04/2020: Hemoglobin 13.1; Platelets 171 04/07/2020: ALT 16 04/27/2020: BUN 15; Creat 0.89; Potassium 4.0; Sodium 143   Lipid Panel    Component Value Date/Time   CHOL 176 04/27/2020 1047   TRIG 112 04/27/2020 1047   HDL 66 04/27/2020 1047   CHOLHDL 2.7 04/27/2020 1047   VLDL 26 03/22/2020 0434   LDLCALC 89 04/27/2020 1047   Additional studies/ records that were reviewed today include:  2D echo 03/22/20  IMPRESSIONS   1. Left ventricular ejection fraction, by estimation, is 55 to 60%. The  left ventricle has normal function. The left ventricle has no regional  wall motion abnormalities. Left ventricular diastolic parameters are  consistent with Grade I diastolic  dysfunction (impaired relaxation).   2. Right ventricular systolic function is normal. The right ventricular  size is normal. Tricuspid regurgitation signal is inadequate for assessing  PA pressure.   3. The mitral valve is normal in structure. No evidence of mitral valve   regurgitation. No evidence of mitral stenosis.   4. The aortic valve is tricuspid. Aortic valve regurgitation is not  visualized. No aortic stenosis is present.   5. The inferior vena cava is normal in size with greater than 50%  respiratory variability, suggesting right atrial pressure of 3 mmHg.   FINDINGS   Left Ventricle: Left ventricular ejection fraction, by estimation, is 55  to 60%. The left ventricle has normal function. The left ventricle has no  regional wall motion abnormalities. The left ventricular internal cavity  size was normal in size. There is   no left ventricular hypertrophy. Left ventricular diastolic parameters  are consistent with Grade I diastolic dysfunction (impaired relaxation).   Right Ventricle: The right ventricular size is normal. No increase in  right ventricular wall thickness. Right ventricular systolic function is  normal. Tricuspid regurgitation signal is inadequate for assessing PA  pressure.   Left Atrium: Left atrial size was normal in size.   Right Atrium: Right atrial size was normal in size.   Pericardium: There is no evidence of pericardial effusion.   Mitral Valve: The mitral valve is normal in structure. No evidence of  mitral valve regurgitation. No evidence of mitral valve stenosis.   Tricuspid Valve: The tricuspid valve is normal in structure. Tricuspid  valve regurgitation is not demonstrated.   Aortic Valve: The aortic valve is tricuspid. Aortic valve regurgitation is  not visualized. No aortic stenosis is present.   Pulmonic Valve: The pulmonic valve was normal in structure. Pulmonic valve  regurgitation is not visualized.   Aorta: The aortic root is normal in size and structure.   Venous: The inferior vena cava is normal in size with greater than 50%  respiratory variability, suggesting right atrial pressure of 3 mmHg.   IAS/Shunts: No atrial level shunt detected by color flow Doppler.   Lower extremity Dopplers  02/2020 Summary:  RIGHT:  - Findings consistent with chronic deep vein thrombosis involving the  right peroneal veins.  - No cystic structure found in the popliteal fossa.  - All other veins visualized appear fully compressible and demonstrate  appropriate Doppler characteristics.    LEFT:  - Findings consistent with chronic deep vein thrombosis involving the left  peroneal veins.  - No cystic structure found in the popliteal fossa.  - All other veins visualized appear fully compressible and demonstrate  appropriate Doppler characteristics.    *See table(s) above for measurements and observations.   Electronically signed by Ida Rogue MD on 04/11/2020 at 7:06:14 P     ASSESSMENT:    1. Primary hypertension   2. Mixed hyperlipidemia   3. Recurrent acute deep vein thrombosis (DVT) of both lower extremities (HCC)       PLAN:  In order of problems listed above:  Essential hypertension -her blood pressure has improved but still elevated, I will increase spironolactone from 12.5 to 25 mg daily to be taken in the morning, continue lisinopril 40 mg daily, and increase amlodipine to 5 mg to be taken at night.  I will check BNP and BMP today.  History of chest pain with coronary CTA 11/2017 calcium score 0 normal coronaries, she now has recurrent chest pain, I am suspicious that this is related to hypertension, the other option is a reflux and I will start her on a trial of Protonix.  Her EKG today is normal and unchanged from prior.  History of recurrent DVT on Xarelto with chronic leg pain and recent Dopplers showing chronic peritoneal DVTs see above, 3 spironolactone to 25 mg daily to eliminate swelling, she is also advised to start using compression socks on a regular basis.  HLD on Repatha, that is well tolerated.  However her insurance stop sending her, will contact our pharmacist to assist her with that.  Her lipids at goal while on Repatha, HDL 66, LDL 89 and triglycerides  112.  Medication Adjustments/Labs and Tests Ordered: Current medicines are reviewed at length with the patient today.  Concerns regarding medicines are outlined above.  Medication changes, Labs and Tests ordered today are listed in the Patient Instructions below. Patient Instructions  Medication Instructions:  INCREASE AMLODIPINE TO 5 MG EVERY DAY INCREASE SPIRONOLACTONE TO 25 MG EVERY DAY  *If you need a refill on your cardiac medications  before your next appointment, please call your pharmacy*   Lab Work: TODAY BMET AND BNP If you have labs (blood work) drawn today and your tests are completely normal, you will receive your results only by: Marland Kitchen MyChart Message (if you have MyChart) OR . A paper copy in the mail If you have any lab test that is abnormal or we need to change your treatment, we will call you to review the results.   Testing/Procedures: NONE   Follow-Up: At [facility], you and your health needs are our priority.  As part of our continuing mission to provide you with exceptional heart care, we have created designated Provider Care Teams.  These Care Teams include your primary Cardiologist (physician) and Advanced Practice Providers (APPs -  Physician Assistants and Nurse Practitioners) who all work together to provide you with the care you need, when you need it.  We recommend signing up for the patient portal called "MyChart".  Sign up information is provided on this After Visit Summary.  MyChart is used to connect with patients for Virtual Visits (Telemedicine).  Patients are able to view lab/test results, encounter notes, upcoming appointments, etc.  Non-urgent messages can be sent to your provider as well.   To learn more about what you can do with MyChart, go to [email or web address].    Your next appointment:   3 month(s)  The format for your next appointment:   In Person  Provider:  Ermalinda Barrios PA  Other Instructions NONE     Signed, Ena Dawley, MD  09/28/2020 3:47 PM    [address], [place], [place]  [address] Phone: [phone]; Fax: [phone]

## 2020-09-28 NOTE — Telephone Encounter (Signed)
Spoke with patient in room.  Patient concerned about copay being expensive. Enrolled in [place] and was approved.    [facility] who did not [facility] current insurance card.  Patient was using a discount card which was making prescriptions more expensive.  Gave updated insurance information, however plan is not active until 10/10/20.  [facility]

## 2020-09-28 NOTE — Patient Instructions (Addendum)
Medication Instructions:  INCREASE AMLODIPINE TO 5 MG EVERY DAY INCREASE SPIRONOLACTONE TO 25 MG EVERY DAY  *If you need a refill on your cardiac medications before your next appointment, please call your pharmacy*   Lab Work: TODAY BMET AND BNP If you have labs (blood work) drawn today and your tests are completely normal, you will receive your results only by: Marland Kitchen MyChart Message (if you have MyChart) OR . A paper copy in the mail If you have any lab test that is abnormal or we need to change your treatment, we will call you to review the results.   Testing/Procedures: NONE   Follow-Up: At [facility], you and your health needs are our priority.  As part of our continuing mission to provide you with exceptional heart care, we have created designated Provider Care Teams.  These Care Teams include your primary Cardiologist (physician) and Advanced Practice Providers (APPs -  Physician Assistants and Nurse Practitioners) who all work together to provide you with the care you need, when you need it.  We recommend signing up for the patient portal called "MyChart".  Sign up information is provided on this After Visit Summary.  MyChart is used to connect with patients for Virtual Visits (Telemedicine).  Patients are able to view lab/test results, encounter notes, upcoming appointments, etc.  Non-urgent messages can be sent to your provider as well.   To learn more about what you can do with MyChart, go to [email or web address].    Your next appointment:   3 month(s)  The format for your next appointment:   In Person  Provider:  Ermalinda Barrios PA  Other Instructions NONE

## 2020-09-29 LAB — BASIC METABOLIC PANEL
BUN/Creatinine Ratio: 22 (ref 12–28)
BUN: 19 mg/dL (ref 8–27)
CO2: 23 mmol/L (ref 20–29)
Calcium: 9.7 mg/dL (ref 8.7–10.3)
Chloride: 105 mmol/L (ref 96–106)
Creatinine, Ser: 0.88 mg/dL (ref 0.57–1.00)
GFR calc Af Amer: 79 mL/min/{1.73_m2} (ref >59)
GFR calc non Af Amer: 69 mL/min/{1.73_m2} (ref >59)
Glucose: 92 mg/dL (ref 65–99)
Potassium: 4.4 mmol/L (ref 3.5–5.2)
Sodium: 143 mmol/L (ref 134–144)

## 2020-09-29 LAB — PRO B NATRIURETIC PEPTIDE: NT-Pro BNP: 54 pg/mL (ref 0–301)

## 2020-10-25 ENCOUNTER — Ambulatory Visit: Payer: 59 | Admitting: Family Medicine

## 2020-10-28 NOTE — Progress Notes (Addendum)
[place] [facility] [address] [place], [place], [place] [address] [phone]  Date:  5/45/6256   Name:  [name]   DOB:  Mar 01, 1956   MRN:  [id number]  PCP:  Darreld Mclean, MD    Chief Complaint: Hypertension (6 month follow up, last OV increased BP meds) and Lab work   History of Present Illness:  [name] is a 65 y.o. very pleasant female patient who presents with the following:  Periodic follow up visit today Last seen by myself in September She has history of colon cancer,recurrentDVT, hypertension, prediabetes, stage I thymoma status post thymectomy 2019 She had covid 19 in August of 2021 with significant transaminitis At last visit I increased her BP medication due to elevated BP numbers Liver tests back to normal last August 21  amlodipine lisinopril xarelto Arlyce Harman 25  covid series- has declined  Pneumonia vaccine- pt declines today Flu shot- declines   Seen by cardiology last month: Essential hypertension -her blood pressure has improved but still elevated, I will increase spironolactone from 12.5 to 25 mg daily to be taken in the morning, continue lisinopril 40 mg daily, and increase amlodipine to 5 mg to be taken at night.  I will check BNP and BMP today.  History of chest pain with coronary CTA 11/2017 calcium score 0 normal coronaries, she now has recurrent chest pain, I am suspicious that this is related to hypertension, the other option is a reflux and I will start her on a trial of Protonix.  Her EKG today is normal and unchanged from prior.  History of recurrent DVT on Xarelto with chronic leg pain and recent Dopplers showing chronic peritoneal DVTs see above, 3 spironolactone to 25 mg daily to eliminate swelling, she is also advised to start using compression socks on a regular basis.  HLD on Repatha, that is well tolerated.  However her insurance stop sending her, will contact our  pharmacist to assist her with that.  Her lipids at goal while on Repatha, HDL 66, LDL 89 and triglycerides 112.  Last oncology visit 6/21- asked to be seen prn  Her BP has continued to run a bit high- she is checking at home and brings in a list of readings with her today Readings up to 165/102-blood pressure tends to be higher in the evenings She was using doxy 50 daily per her derm for some sort of scalp issue but she got worried after an episode of blood in her stool 10 days ago and stopped using it. Blood has not recurred  She notes that after her COVID-19 illness she developed itchy scalp and hair loss.  Advised her that bleeding was likely not due to doxycycline.  Would be okay to try this again, let me know if bleeding does not recur  She went back on Repatha this month- she was not able to get it in January/ Feb due to an insurnace issue  Colonoscopy: done last year per pt, Dr Benson Norway.  I do not have this report- requested records Patient Active Problem List   Diagnosis Date Noted  . Stroke-like symptoms 03/21/2020  . HLD (hyperlipidemia)   . COVID-19 virus infection   . Elevated liver enzymes   . Ischemic stroke ([place])   . 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 Prediabetes 05/25/2019  . 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 Bilateral leg pain 07/02/2016  . History of colon cancer, stage I 08/17/2015  . Varicose veins  of both lower extremities with pain 08/26/2014  . HTN (hypertension) 12/24/2013  . Cancer of left colon ([place]) 02/25/2012  . DVT (deep venous thrombosis) ([place]) 08/27/2011    Past Medical History:  Diagnosis Date  . Bilateral leg pain 07/02/2016  . Cancer ([place]) 08/03/2010   colon  . 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 History of colon cancer, stage I 08/17/2015  . HLD (hyperlipidemia)   . Hypertension   . Prediabetes 05/25/2019  . Uterine fibroid   . Varicose 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 08/26/2014    Past  Surgical History:  Procedure Laterality Date  . ABDOMINAL HYSTERECTOMY    . CHOLECYSTECTOMY    . COLON SURGERY    . GALLBLADDER SURGERY    . MEDIASTERNOTOMY N/A 11/24/2017   Procedure: PARTIAL STERNOTOMY;  Surgeon: [name], MD;  Location: [facility];  Service: Thoracic;  Laterality: N/A;  . THYMECTOMY  11/24/2017   Procedure: THYMECTOMY;  Surgeon: [name], MD;  Location: [facility];  Service: Thoracic;;    Social History   Tobacco Use  . Smoking status: Never Smoker  . Smokeless tobacco: Never Used  Vaping Use  . Vaping Use: Never used  Substance Use Topics  . Alcohol use: No    Alcohol/week: 0.0 standard drinks  . Drug use: No    Family History  Problem Relation Age of Onset  . Heart attack Mother   . Hypertension Father     No Known Allergies  Medication list has been reviewed and updated.  Current Outpatient Medications on File Prior to Visit  Medication Sig Dispense Refill  . amLODipine (NORVASC) 5 MG tablet Take 1 tablet (5 mg total) by mouth at bedtime. 90 tablet 3  . Evolocumab (REPATHA SURECLICK) 338 MG/ML SOAJ Repatha SureClick 250 mg/mL subcutaneous pen injector  INJECT 1 PEN INTO THE SKIN EVERY 14 (FOURTEEN) DAYS.    Marland Kitchen lisinopril (ZESTRIL) 40 MG tablet Take 1 tablet (40 mg total) by mouth every morning. 90 tablet 1  . rivaroxaban (XARELTO) 20 MG TABS tablet Take 1 tablet (20 mg total) by mouth daily with supper. 90 tablet 3  . spironolactone (ALDACTONE) 12.5 mg TABS tablet Take 12.5 mg by mouth daily.     No current facility-administered medications on file prior to visit.    Review of Systems: As per HPI- otherwise negative.   Physical Examination: Vitals:   10/30/20 1022  BP: (!) 144/80  Pulse: (!) 57  Resp: 18  Temp: (!) 97.4 F (36.3 C)  SpO2: 98%   Vitals:   10/30/20 1022  Weight: 190 lb (86.2 kg)  Height: 5\' 7"  (1.702 m)   Body mass index is 29.76 kg/m. Ideal Body Weight: Weight in (lb) to have BMI = 25:  159.3  GEN: no acute distress.  Tall build, overweight.  Appears her normal self HEENT: Atraumatic, Normocephalic.  Scalp appears normal to my exam Ears and Nose: No external deformity. CV: RRR, No M/G/R. No JVD. No thrill. No extra heart sounds. PULM: CTA B, no wheezes, crackles, rhonchi. No retractions. No resp. distress. No accessory muscle use. ABD: S, NT, ND, +BS. No rebound. No HSM. EXTR: No c/c/e PSYCH: Normally interactive. Conversant.   BP Readings from Last 3 Encounters:  10/30/20 (!) 144/80  09/28/20 140/80  07/26/20 (!) 172/90    Assessment and Plan: Essential hypertension - Plan: lisinopril (ZESTRIL) 40 MG tablet  Dyslipidemia  Vitamin D deficiency -  Plan: VITAMIN D 25 Hydroxy (Vit-D Deficiency, Fractures)  Prediabetes - Plan: Hemoglobin A1c  Primary hypertension - Plan: TSH  Medication monitoring encounter - Plan: Ferritin, Basic metabolic panel  Pt here today for a follow-up visit She had a one time episode of rectal bleeding about 10 days ago- she thought maybe due to low dose doxy she was taking per derm.  Advised her that this is unlikely-I think okay to try doxycycline again.  Please let me know if bleeding returns.  She did have a colonoscopy last year per her report, I requested these records Blood pressure looks okay today, but the patient has been checking her pressure regularly at home and it tends to be elevated  Hesitate to increase amlodipine as she already tends towards lower extremity swelling.  We will have her try 60 mg of lisinopril, I explained this may not make a big difference from 40 mg.  She will try 60 mg and monitor her blood pressure, will let me know how she responds  Will plan further follow- up pending labs.   This visit occurred during the SARS-CoV-2 public health emergency.  Safety protocols were in place, including screening questions prior to the visit, additional usage of staff PPE, and extensive cleaning of exam room while  observing appropriate contact time as indicated for disinfecting solutions.    Signed Lamar Blinks, MD  Received her labs as below, message to pt  Results for orders placed or performed in visit on 10/30/20  Hemoglobin A1c  Result Value Ref Range   Hgb A1c MFr Bld 6.0 4.6 - 6.5 %  TSH  Result Value Ref Range   TSH 2.45 0.35 - 4.50 uIU/mL  VITAMIN D 25 Hydroxy (Vit-D Deficiency, Fractures)  Result Value Ref Range   VITD 20.00 (L) 30.00 - 100.00 ng/mL  Ferritin  Result Value Ref Range   Ferritin 35.7 10.0 - 291.0 ng/mL  Basic metabolic panel  Result Value Ref Range   Sodium 143 135 - 145 mEq/L   Potassium 4.2 3.5 - 5.1 mEq/L   Chloride 106 96 - 112 mEq/L   CO2 28 19 - 32 mEq/L   Glucose, Bld 121 (H) 70 - 99 mg/dL   BUN 13 6 - 23 mg/dL   Creatinine, Ser 0.98 0.40 - 1.20 mg/dL   GFR 59.95 (L) >60.00 mL/min   Calcium 9.6 8.4 - 10.5 mg/dL

## 2020-10-28 NOTE — Patient Instructions (Addendum)
It was good to see you again today, I will be in touch with your labs Please consider getting your shingles vaccine (shingrix) and other routine immunizations at your pharmacy asap  Let's have you try increasing lisionpril to 60mg  and see how your BP responds.  If not improved go back to 40 mg

## 2020-10-30 ENCOUNTER — Encounter: Payer: Self-pay | Admitting: Family Medicine

## 2020-10-30 ENCOUNTER — Ambulatory Visit (INDEPENDENT_AMBULATORY_CARE_PROVIDER_SITE_OTHER): Payer: 59 | Admitting: Family Medicine

## 2020-10-30 ENCOUNTER — Other Ambulatory Visit: Payer: Self-pay

## 2020-10-30 VITALS — BP 144/80 | HR 57 | Temp 97.4°F | Resp 18 | Ht 67.0 in | Wt 190.0 lb

## 2020-10-30 DIAGNOSIS — E559 Vitamin D deficiency, unspecified: Secondary | ICD-10-CM

## 2020-10-30 DIAGNOSIS — R7303 Prediabetes: Secondary | ICD-10-CM | POA: Diagnosis not present

## 2020-10-30 DIAGNOSIS — Z5181 Encounter for therapeutic drug level monitoring: Secondary | ICD-10-CM

## 2020-10-30 DIAGNOSIS — I10 Essential (primary) hypertension: Secondary | ICD-10-CM | POA: Diagnosis not present

## 2020-10-30 DIAGNOSIS — E785 Hyperlipidemia, unspecified: Secondary | ICD-10-CM | POA: Diagnosis not present

## 2020-10-30 LAB — FERRITIN: Ferritin: 35.7 ng/mL (ref 10.0–291.0)

## 2020-10-30 LAB — HEMOGLOBIN A1C: Hgb A1c MFr Bld: 6 % (ref 4.6–6.5)

## 2020-10-30 LAB — BASIC METABOLIC PANEL
BUN: 13 mg/dL (ref 6–23)
CO2: 28 mEq/L (ref 19–32)
Calcium: 9.6 mg/dL (ref 8.4–10.5)
Chloride: 106 mEq/L (ref 96–112)
Creatinine, Ser: 0.98 mg/dL (ref 0.40–1.20)
GFR: 59.95 mL/min — ABNORMAL LOW (ref >60.00)
Glucose, Bld: 121 mg/dL — ABNORMAL HIGH (ref 70–99)
Potassium: 4.2 mEq/L (ref 3.5–5.1)
Sodium: 143 mEq/L (ref 135–145)

## 2020-10-30 LAB — TSH: TSH: 2.45 u[IU]/mL (ref 0.35–4.50)

## 2020-10-30 LAB — VITAMIN D 25 HYDROXY (VIT D DEFICIENCY, FRACTURES): VITD: 20 ng/mL — ABNORMAL LOW (ref 30.00–100.00)

## 2020-10-30 MED ORDER — LISINOPRIL 40 MG PO TABS
60.0000 mg | ORAL_TABLET | ORAL | 3 refills | Status: DC
Start: 2020-10-30 — End: 2021-09-05

## 2020-10-31 ENCOUNTER — Encounter: Payer: Self-pay | Admitting: Family Medicine

## 2020-10-31 ENCOUNTER — Other Ambulatory Visit: Payer: Self-pay

## 2020-10-31 MED ORDER — VITAMIN D (ERGOCALCIFEROL) 1.25 MG (50000 UNIT) PO CAPS: 50000.0000 [IU] | ORAL_CAPSULE | ORAL | 0 refills | Status: DC

## 2020-12-26 NOTE — Progress Notes (Signed)
Cardiology Office Note    Date:  0/53/9767   ID:  Eh, Sauseda Dec 07, 1955, MRN [id number]   PCP:  Darreld Mclean, MD   [place]  Cardiologist:  Ena Dawley, MD (Inactive)  Advanced Practice Provider:  No care team member to display Electrophysiologist:  None   [id number]   Chief Complaint  Patient presents with  . Follow-up    History of Present Illness:  [name] is a 65 y.o. 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 Coronary CTA 11/11/17 Calcium score 0 and normal coronary arteries. 2D echo 03/22/2020 normal LVEF 55 to 60% with grade 1 DD   Was discharged 03/22/2020 with gastritis versus hypertensive encephalopathy presenting with nausea vomiting abdominal pain.  Blood pressure 180/90.  MRI was negative for stroke MRA unremarkable echo normal LV function LDL 97.  She did have Covid 02/2020.  The patient has had elevated blood pressure ever since she had Covid  Patient was seen by Dr. Meda Coffee 09/28/2020 blood pressure was still up in the afternoons so spironolactone increased to 25 mg daily to be taken in the morning amlodipine increased to 5 mg to take in the evening.  She was having some chest pain that was felt to be less associated with hypertension but could also be reflux and was started on Protonix.  Patient's PCP increased lisinopril to 60 mg daily 10/2020.  Patient comes in for f/u. On disability for shoulder and may need another surgery. BP up sometimes and taking lisinopril 40 mg daily and takes another 1/2 tablet at that time. Gets a little dizzy if she bends over. Walks 1 hr daily without chest pain, dyspnea,palpitations. Under some stress, son out of work. BP up today but hasn't taken her meds.     Past Medical History:  Diagnosis Date  . Bilateral leg pain 07/02/2016  . Cancer ([place]) 08/03/2010    colon  . 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 History of colon cancer, stage I 08/17/2015  . HLD (hyperlipidemia)   . Hypertension   . Prediabetes 05/25/2019  . Uterine fibroid   . Varicose 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 08/26/2014    Past Surgical History:  Procedure Laterality Date  . ABDOMINAL HYSTERECTOMY    . CHOLECYSTECTOMY    . COLON SURGERY    . GALLBLADDER SURGERY    . MEDIASTERNOTOMY N/A 11/24/2017   Procedure: PARTIAL STERNOTOMY;  Surgeon: [name], MD;  Location: [facility];  Service: Thoracic;  Laterality: N/A;  . THYMECTOMY  11/24/2017   Procedure: THYMECTOMY;  Surgeon: [name], MD;  [facility] OR;  Service: Thoracic;;    Current Medications: Current Meds  Medication Sig  . amLODipine (NORVASC) 5 MG tablet Take 1 tablet (5 mg total) by mouth at bedtime.  . Evolocumab (REPATHA SURECLICK) 992 MG/ML SOAJ Repatha SureClick 426 mg/mL subcutaneous pen injector  INJECT 1 PEN INTO THE SKIN EVERY 14 (FOURTEEN) DAYS.  Marland Kitchen lisinopril (ZESTRIL) 40 MG tablet Take 1.5 tablets (60 mg total) by mouth every morning.  . rivaroxaban (XARELTO) 20 MG TABS tablet Take 1 tablet (20 mg total) by mouth daily with supper.  Marland Kitchen spironolactone (ALDACTONE) 12.5 mg TABS tablet Take 12.5 mg by mouth daily.  . Vitamin D, Ergocalciferol, (DRISDOL) 1.25 MG (50000 UNIT) CAPS capsule  Take 1 capsule (50,000 Units total) by mouth every 7 (seven) days.     Allergies:   Patient has no known allergies.   Social History   Socioeconomic History  . Marital status: Married    Spouse name: Not on file  . Number of children: 1  . Years of education: Bachelors  . Highest education level: Not on file  Occupational History  . Not on file  Tobacco Use  . Smoking status: Never Smoker  . Smokeless tobacco: Never Used  Vaping Use  . Vaping Use: Never used  Substance and Sexual Activity  . Alcohol use: No    Alcohol/week: 0.0  standard drinks  . Drug use: No  . Sexual activity: Not on file  Other Topics Concern  . Not on file  Social History Narrative   Lives at home with husband and son.   Right-handed.   No caffeine use.   Social Determinants of Health   Financial Resource Strain: Not on file  Food Insecurity: Not on file  Transportation Needs: Not on file  Physical Activity: Not on file  Stress: Not on file  Social Connections: Not on file     Family History:  The patient's family history includes Heart attack in her mother; Hypertension in her father.   ROS:   Please see the history of present illness.    ROS All other systems reviewed and are negative.   PHYSICAL EXAM:   VS:  BP (!) 160/85   Ht 5\' 7"  (1.702 m)   Wt 191 lb (86.6 kg)   BMI 29.91 kg/m   Physical Exam  GEN: Well nourished, well developed, in no acute distress  Neck: no JVD, carotid bruits, or masses Cardiac:RRR; no murmurs, rubs, or gallops  Respiratory:  clear to auscultation bilaterally, normal work of breathing GI: soft, nontender, nondistended, + BS Ext: without cyanosis, clubbing, or edema, Good distal pulses bilaterally Neuro:  Alert and Oriented x 3 Psych: euthymic mood, full affect  Wt Readings from Last 3 Encounters:  01/02/21 191 lb (86.6 kg)  10/30/20 190 lb (86.2 kg)  09/28/20 196 lb (88.9 kg)      Studies/Labs Reviewed:   EKG:  EKG is  ordered today.  The ekg ordered today demonstrates NSR nonspecific ST changes, no acute change  Recent Labs: 04/04/2020: Hemoglobin 13.1; Platelets 171 04/07/2020: ALT 16 09/28/2020: NT-Pro BNP 54 10/30/2020: BUN 13; Creatinine, Ser 0.98; Potassium 4.2; Sodium 143; TSH 2.45   Lipid Panel    Component Value Date/Time   CHOL 176 04/27/2020 1047   TRIG 112 04/27/2020 1047   HDL 66 04/27/2020 1047   CHOLHDL 2.7 04/27/2020 1047   VLDL 26 03/22/2020 0434   LDLCALC 89 04/27/2020 1047    Additional studies/ records that were reviewed today include:  2D echo  03/22/20  IMPRESSIONS     1. Left ventricular ejection fraction, by estimation, is 55 to 60%. The  left ventricle has normal function. The left ventricle has no regional  wall motion abnormalities. Left ventricular diastolic parameters are  consistent with Grade I diastolic  dysfunction (impaired relaxation).   2. Right ventricular systolic function is normal. The right ventricular  size is normal. Tricuspid regurgitation signal is inadequate for assessing  PA pressure.   3. The mitral valve is normal in structure. No evidence of mitral valve  regurgitation. No evidence of mitral stenosis.   4. The aortic valve is tricuspid. Aortic valve regurgitation is not  visualized. No aortic stenosis is  present.   5. The inferior vena cava is normal in size with greater than 50%  respiratory variability, suggesting right atrial pressure of 3 mmHg.   FINDINGS   Left Ventricle: Left ventricular ejection fraction, by estimation, is 55  to 60%. The left ventricle has normal function. The left ventricle has no  regional wall motion abnormalities. The left ventricular internal cavity  size was normal in size. There is   no left ventricular hypertrophy. Left ventricular diastolic parameters  are consistent with Grade I diastolic dysfunction (impaired relaxation).   Right Ventricle: The right ventricular size is normal. No increase in  right ventricular wall thickness. Right ventricular systolic function is  normal. Tricuspid regurgitation signal is inadequate for assessing PA  pressure.   Left Atrium: Left atrial size was normal in size.   Right Atrium: Right atrial size was normal in size.   Pericardium: There is no evidence of pericardial effusion.   Mitral Valve: The mitral valve is normal in structure. No evidence of  mitral valve regurgitation. No evidence of mitral valve stenosis.   Tricuspid Valve: The tricuspid valve is normal in structure. Tricuspid  valve regurgitation is not  demonstrated.   Aortic Valve: The aortic valve is tricuspid. Aortic valve regurgitation is  not visualized. No aortic stenosis is present.   Pulmonic Valve: The pulmonic valve was normal in structure. Pulmonic valve  regurgitation is not visualized.   Aorta: The aortic root is normal in size and structure.   Venous: The inferior vena cava is normal in size with greater than 50%  respiratory variability, suggesting right atrial pressure of 3 mmHg.   IAS/Shunts: No atrial level shunt detected by color flow Doppler.    Lower extremity Dopplers 02/2020 Summary:  RIGHT:  - Findings consistent with chronic deep vein thrombosis involving the  right peroneal veins.  - No cystic structure found in the popliteal fossa.  - All other veins visualized appear fully compressible and demonstrate  appropriate Doppler characteristics.    LEFT:  - Findings consistent with chronic deep vein thrombosis involving the left  peroneal veins.  - No cystic structure found in the popliteal fossa.  - All other veins visualized appear fully compressible and demonstrate  appropriate Doppler characteristics.    *See table(s) above for measurements and observations.   Electronically signed by Ida Rogue MD on 04/11/2020 at [place] Assessment/Calculations:         ASSESSMENT:    1. Essential hypertension   2. History of chest pain   3. History of recurrent deep vein thrombosis (DVT)   4. Hyperlipidemia, unspecified hyperlipidemia type      PLAN:  In order of problems listed above:  Essential hypertension PCP increase lisinopril to 60 mg daily 10/2020 but patient only takes an extra 1/2 if BP up. This seems to work best for her. Continue current treatment. No changes  History of chest pain with coronary CTA 11/2017 calcium score 0 normal coronaries-walking 1 hr daily without problems  History of recurrent DVT on Xarelto with chronic leg pain and Doppler showing chronic  peritoneal DVTs  HLD on Repatha LDL 89 04/27/20  Shared Decision Making/Informed Consent        Medication Adjustments/Labs and Tests Ordered: Current medicines are reviewed at length with the patient today.  Concerns regarding medicines are outlined above.  Medication changes, Labs and Tests ordered today are listed in the Patient Instructions below. Patient  Instructions  Medication Instructions:  Your physician recommends that you continue on your current medications as directed. Please refer to the Current Medication list given to you today.  *If you need a refill on your cardiac medications before your next appointment, please call your pharmacy*   Lab Work: none If you have labs (blood work) drawn today and your tests are completely normal, you will receive your results only by: Marland Kitchen MyChart Message (if you have MyChart) OR . A paper copy in the mail If you have any lab test that is abnormal or we need to change your treatment, we will call you to review the results.   Testing/Procedures: none   Follow-Up: At [facility], you and your health needs are our priority.  As part of our continuing mission to provide you with exceptional heart care, we have created designated Provider Care Teams.  These Care Teams include your primary Cardiologist (physician) and Advanced Practice Providers (APPs -  Physician Assistants and Nurse Practitioners) who all work together to provide you with the care you need, when you need it.  We recommend signing up for the patient portal called "MyChart".  Sign up information is provided on this After Visit Summary.  MyChart is used to connect with patients for Virtual Visits (Telemedicine).  Patients are able to view lab/test results, encounter notes, upcoming appointments, etc.  Non-urgent messages can be sent to your provider as well.   To learn more about what you can do with MyChart, go to [email or web address].    Your next appointment:   1  year(s)  The format for your next appointment:   In Person  Provider:   Gwyndolyn Kaufman, MD   Other Instructions      Signed, Ermalinda Barrios, PA-C  01/02/2021 8:46 AM    [address], [place], [place]  [address] Phone: [phone]; Fax: [phone]

## 2021-01-02 ENCOUNTER — Encounter: Payer: Self-pay | Admitting: Physician Assistant

## 2021-01-02 ENCOUNTER — Other Ambulatory Visit: Payer: Self-pay

## 2021-01-02 ENCOUNTER — Ambulatory Visit (INDEPENDENT_AMBULATORY_CARE_PROVIDER_SITE_OTHER): Payer: 59 | Admitting: Physician Assistant

## 2021-01-02 VITALS — BP 160/85 | Ht 67.0 in | Wt 191.0 lb

## 2021-01-02 DIAGNOSIS — I10 Essential (primary) hypertension: Secondary | ICD-10-CM

## 2021-01-02 DIAGNOSIS — E785 Hyperlipidemia, unspecified: Secondary | ICD-10-CM | POA: Diagnosis not present

## 2021-01-02 DIAGNOSIS — Z86718 Personal history of other venous thrombosis and embolism: Secondary | ICD-10-CM | POA: Diagnosis not present

## 2021-01-02 DIAGNOSIS — Z87898 Personal history of other specified conditions: Secondary | ICD-10-CM | POA: Diagnosis not present

## 2021-01-02 NOTE — Patient Instructions (Signed)
Medication Instructions:  Your physician recommends that you continue on your current medications as directed. Please refer to the Current Medication list given to you today.  *If you need a refill on your cardiac medications before your next appointment, please call your pharmacy*   Lab Work: none If you have labs (blood work) drawn today and your tests are completely normal, you will receive your results only by: Marland Kitchen MyChart Message (if you have MyChart) OR . A paper copy in the mail If you have any lab test that is abnormal or we need to change your treatment, we will call you to review the results.   Testing/Procedures: none   Follow-Up: At [facility], you and your health needs are our priority.  As part of our continuing mission to provide you with exceptional heart care, we have created designated Provider Care Teams.  These Care Teams include your primary Cardiologist (physician) and Advanced Practice Providers (APPs -  Physician Assistants and Nurse Practitioners) who all work together to provide you with the care you need, when you need it.  We recommend signing up for the patient portal called "MyChart".  Sign up information is provided on this After Visit Summary.  MyChart is used to connect with patients for Virtual Visits (Telemedicine).  Patients are able to view lab/test results, encounter notes, upcoming appointments, etc.  Non-urgent messages can be sent to your provider as well.   To learn more about what you can do with MyChart, go to [email or web address].    Your next appointment:   1 year(s)  The format for your next appointment:   In Person  Provider:   Gwyndolyn Kaufman, MD   Other Instructions

## 2021-01-18 ENCOUNTER — Telehealth: Payer: Self-pay

## 2021-01-18 NOTE — Telephone Encounter (Signed)
Who Is Calling Patient / Member / Family / Caregiver Call Type Triage / Clinical Relationship To Patient Self Return Phone Number [phone] (Primary) Chief Complaint CHEST PAIN - pain, pressure, heaviness or tightness Reason for Call Symptomatic / Request for Health Information Initial Comment Caller says she just got disconnected with someone else (no previous triage chart) but mentions chest pain. Translation No Nurse Assessment Nurse: Ysidro Evert, RN, Levada Dy Date/Time (Eastern Time): 01/18/2021 9:45:22 AM Confirm and document reason for call. If symptomatic, describe symptoms. ---Caller states she is having chest pain constantly for the last 8 days  Disp. Time Eilene Ghazi Time) Disposition Final User 01/18/2021 9:43:39 AM Send to Urgent Vassie Loll 01/18/2021 9:51:28 AM 911 Outcome Documentation Ysidro Evert, RN, Levada Dy Reason: Caller states she won't call an ambulance and just wants to see her doctor PLEASE NOTE: All timestamps contained within this report are represented as [place] Standard Time. CONFIDENTIALTY NOTICE: This fax transmission is intended only for the addressee. It contains information that is legally privileged, confidential or otherwise protected from use or disclosure. If you are not the intended recipient, you are strictly prohibited from reviewing, disclosing, copying using or disseminating any of this information or taking any action in reliance on or regarding this information. If you have received this fax in error, please notify us immediately by telephone so that we can arrange for its return to [place]. Phone: [phone], Toll-Free: [phone], Fax: [phone] Page: 2 of 2 Call Id: [id number] 01/18/2021 9:50:48 AM Call EMS 911 Now Yes Ysidro Evert, RN, Levada Dy

## 2021-01-18 NOTE — Telephone Encounter (Signed)
Pt called stating she has been having chest pain intermittently for about a week and now it is not going away.  Pt transferred to triage.

## 2021-01-18 NOTE — Progress Notes (Signed)
[facility] at [facility] [address], [place], [place] [address] [phone]  Date:  1/44/3154   Name:  [name]   DOB:  04-29-1956   MRN:  [id number]  PCP:  Darreld Mclean, MD    Chief Complaint: Chest Pain (Couple months, 2-3 times per week, sees carsiology, ekg normal/)   History of Present Illness:  [name] is a 65 y.o. very pleasant female patient who presents with the following:  Pt here today with concern of chest pain Last seen by myself in March - She has history of colon cancer, recurrent DVT on xarelto, hypertension, prediabetes, stage I thymoma status post thymectomy 2019 She had covid 19 in August of 2021 with significant transamiitis  She called in 6/9 with concern of CP, was advised to go to ER but refused.  Appt made for today  CT coronary in 2019- IMPRESSION: 1. Coronary calcium score of 0. This was 0 percentile for age and sex matched control.  Visit with cardiology last month -  1. Essential hypertension  2. History of chest pain  3. History of recurrent deep vein thrombosis (DVT)  4. Hyperlipidemia, unspecified hyperlipidemia type     PLAN:  In order of problems listed above: Essential hypertension PCP increase lisinopril to 60 mg daily 10/2020 but patient only takes an extra 1/2 if BP up. This seems to work best for her. Continue current treatment. No changes History of chest pain with coronary CTA 11/2017 calcium score 0 normal coronaries-walking 1 hr daily without problems History of recurrent DVT on Xarelto with chronic leg pain and Doppler showing chronic peritoneal DVTs HLD on Repatha LDL 89 04/27/20  Covid vaccine-patient has declined Shingles vaccine -patient has declined Most recent labs in March  She has noticed left breast/ chest pain for abut 10 days- constant during this time. Previously she had noted intermittent pain She feels like this is the same pain she has noted  in the past except more persistent She notes that the pain started maybe 2 months ago No SOB Nothing makes the pain better or worse -not affected by exertion  Mammo done 3/21  She has checked her BP when the pain is more pronounced but it is not generally elevated She did have mediastinotomy surgery for thymoma a few years ago, wonders if this could contribute to her pain  BP Readings from Last 3 Encounters:  01/22/21 (!) 144/82  01/02/21 (!) 160/85  10/30/20 (!) 144/80     Patient Active Problem List   Diagnosis Date Noted   Stroke-like symptoms 03/21/2020   HLD (hyperlipidemia)    COVID-19 virus infection    Elevated liver enzymes    Ischemic stroke ([place])    Statin myopathy 12/29/2019   Prediabetes 05/25/2019   S/P thymectomy 11/24/2017   Bilateral leg pain 07/02/2016   History of colon cancer, stage I 08/17/2015   Varicose 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 08/26/2014   HTN (hypertension) 12/24/2013   Cancer of left colon ([place]) 02/25/2012   DVT (deep venous thrombosis) ([place]) 08/27/2011    Past Medical History:  Diagnosis Date   Bilateral leg pain 07/02/2016   Cancer ([place]) 08/03/2010   colon   DVT (deep venous thrombosis) ([facility])    recurrent; on long term anticoag (Rivaroxaban)   History of colon cancer, stage I 08/17/2015   HLD (hyperlipidemia)    Hypertension    Prediabetes 05/25/2019   Uterine fibroid  Varicose 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 08/26/2014    Past Surgical History:  Procedure Laterality Date   ABDOMINAL HYSTERECTOMY     CHOLECYSTECTOMY     COLON SURGERY     GALLBLADDER SURGERY     MEDIASTERNOTOMY N/A 11/24/2017   Procedure: PARTIAL STERNOTOMY;  Surgeon: [name], MD;  Location: [facility] OR;  Service: Thoracic;  Laterality: N/A;   THYMECTOMY  11/24/2017   Procedure: THYMECTOMY;  Surgeon: [name], MD;  Location: [facility] OR;  Service: Thoracic;;    Social History   Tobacco Use   Smoking status: Never    Smokeless tobacco: Never  Vaping Use   Vaping Use: Never used  Substance Use Topics   Alcohol use: No    Alcohol/week: 0.0 standard drinks   Drug use: No    Family History  Problem Relation Age of Onset   Heart attack Mother    Hypertension Father     No Known Allergies  Medication list has been reviewed and updated.  Current Outpatient Medications on File Prior to Visit  Medication Sig Dispense Refill   amLODipine (NORVASC) 5 MG tablet Take 1 tablet (5 mg total) by mouth at bedtime. 90 tablet 3   Evolocumab (REPATHA SURECLICK) 580 MG/ML SOAJ Repatha SureClick 998 mg/mL subcutaneous pen injector  INJECT 1 PEN INTO THE SKIN EVERY 14 (FOURTEEN) DAYS.     lisinopril (ZESTRIL) 40 MG tablet Take 1.5 tablets (60 mg total) by mouth every morning. 136 tablet 3   rivaroxaban (XARELTO) 20 MG TABS tablet Take 1 tablet (20 mg total) by mouth daily with supper. 90 tablet 3   spironolactone (ALDACTONE) 12.5 mg TABS tablet Take 12.5 mg by mouth daily.     Vitamin D, Ergocalciferol, (DRISDOL) 1.25 MG (50000 UNIT) CAPS capsule Take 1 capsule (50,000 Units total) by mouth every 7 (seven) days. 12 capsule 0   No current facility-administered medications on file prior to visit.    Review of Systems:  As per HPI- otherwise negative.   Physical Examination: Vitals:   01/22/21 1335  BP: (!) 144/82  Pulse: 79  Resp: 18  Temp: (!) 97.3 F (36.3 C)  SpO2: 99%   Vitals:   01/22/21 1335  Weight: 188 lb (85.3 kg)  Height: 5\' 7"  (1.702 m)   Body mass index is 29.44 kg/m. Ideal Body Weight: Weight in (lb) to have BMI = 25: 159.3  GEN: no acute distress.  Overweight, looks well and her normal self HEENT: Atraumatic, Normocephalic.  Ears and Nose: No external deformity. CV: RRR, No M/G/R. No JVD. No thrill. No extra heart sounds. PULM: CTA B, no wheezes, crackles, rhonchi. No retractions. No resp. distress. No accessory muscle use. ABD: S, NT, ND, +BS. No rebound. No HSM. EXTR: No  c/c/e PSYCH: Normally interactive. Conversant.  I am able to easily reproduce her chest pain by pressing on her chest wall, interestingly more on the right side of her sternum than on the left.  Breast exam is normal bilaterally  EKG; NSR, no ST changes  Compared with EKG from 5/22 no significant change noted  Assessment and Plan: Chest pain, unspecified type - Plan: DG Chest 2 View, EKG 12-Lead, Troponin I (High Sensitivity), CANCELED: Troponin I (High Sensitivity)  Dyslipidemia - Plan: Hepatic function panel  Essential hypertension - Plan: Hepatic function panel  Prediabetes Patient today with concern of chest pain.  She has seen cardiology for this and had a negative work-up, on exam this seems more likely  musculoskeletal pain.  EKG is normal today.  We will check a troponin and chest film.  I offered to have her seen in the ER but she declines for now  Follow-up on her liver function today-has not had liver function in a year or so and has been elevated in the past  This visit occurred during the SARS-CoV-2 public health emergency.  Safety protocols were in place, including screening questions prior to the visit, additional usage of staff PPE, and extensive cleaning of exam room while observing appropriate contact time as indicated for disinfecting solutions.   Signed Lamar Blinks, MD Received her labs and chest film as below, gave patient a call as she does not have my chart Left message on machine-both numbers-  as she did not answer. Try tylenol as needed.  Let me know if this does not resolve pain  DG Chest 2 View  Result Date: 01/22/2021 CLINICAL DATA:  Left-sided chest pain for 2 months. History of thymoma resection. EXAM: CHEST - 2 VIEW COMPARISON:  07/28/2019 FINDINGS: Prior median sternotomy. The heart size and mediastinal contours are within normal limits. Atherosclerotic calcification of the aortic knob. No focal airspace consolidation, pleural effusion, or pneumothorax.  The visualized skeletal structures are unremarkable. IMPRESSION: No active cardiopulmonary disease. Electronically Signed   By: Davina Poke D.O.   On: 01/22/2021 14:45    Results for orders placed or performed in visit on 01/22/21  Hepatic function panel  Result Value Ref Range   Total Bilirubin 0.5 0.2 - 1.2 mg/dL   Bilirubin, Direct 0.1 0.0 - 0.3 mg/dL   Alkaline Phosphatase 76 39 - 117 U/L   AST 23 0 - 37 U/L   ALT 18 0 - 35 U/L   Total Protein 7.0 6.0 - 8.3 g/dL   Albumin 4.8 3.5 - 5.2 g/dL  Troponin I (High Sensitivity)  Result Value Ref Range   High Sens Troponin I 5 2 - 17 ng/L

## 2021-01-18 NOTE — Telephone Encounter (Signed)
FYI I called to follow up with patient because I did not see where she had went to ER. I advised that she should be seen right away.  She does not want to go right now as she is not having and chest pains right now.  She feels fine.  She stated that she has been having off and on chest pains.  She wanted to make an appt for Monday.  Appointment made but made it clear to the patient that if you have any chest pains over the weekend do not wait for appointment and that she needs to be seen right away and head to ER.

## 2021-01-22 ENCOUNTER — Other Ambulatory Visit: Payer: Self-pay

## 2021-01-22 ENCOUNTER — Encounter: Payer: Self-pay | Admitting: Family Medicine

## 2021-01-22 ENCOUNTER — Ambulatory Visit (HOSPITAL_BASED_OUTPATIENT_CLINIC_OR_DEPARTMENT_OTHER)
Admission: RE | Admit: 2021-01-22 | Discharge: 2021-01-22 | Disposition: A | Discharge status: Home / Self Care | Payer: 59 | Source: Ambulatory Visit | Attending: Family Medicine | Admitting: Family Medicine

## 2021-01-22 ENCOUNTER — Ambulatory Visit (INDEPENDENT_AMBULATORY_CARE_PROVIDER_SITE_OTHER): Payer: 59 | Admitting: Family Medicine

## 2021-01-22 VITALS — BP 122/80 | HR 79 | Temp 97.3°F | Resp 18 | Ht 67.0 in | Wt 188.0 lb

## 2021-01-22 DIAGNOSIS — R079 Chest pain, unspecified: Secondary | ICD-10-CM

## 2021-01-22 DIAGNOSIS — E785 Hyperlipidemia, unspecified: Secondary | ICD-10-CM

## 2021-01-22 DIAGNOSIS — R7303 Prediabetes: Secondary | ICD-10-CM | POA: Diagnosis not present

## 2021-01-22 DIAGNOSIS — I10 Essential (primary) hypertension: Secondary | ICD-10-CM | POA: Diagnosis not present

## 2021-01-22 LAB — HEPATIC FUNCTION PANEL
ALT: 18 U/L (ref 0–35)
AST: 23 U/L (ref 0–37)
Albumin: 4.8 g/dL (ref 3.5–5.2)
Alkaline Phosphatase: 76 U/L (ref 39–117)
Bilirubin, Direct: 0.1 mg/dL (ref 0.0–0.3)
Total Bilirubin: 0.5 mg/dL (ref 0.2–1.2)
Total Protein: 7 g/dL (ref 6.0–8.3)

## 2021-01-22 LAB — TROPONIN I (HIGH SENSITIVITY): High Sens Troponin I: 5 ng/L (ref 2–17)

## 2021-01-22 NOTE — Patient Instructions (Signed)
It was good to see you today!   Please go to lab, and then to the imaging dept on the ground floor for an x-ray We will make sure your lungs are ok, and check a blood test for your heart.  Assuming these are ok, please use tylenol OTC as needed for your chest pain- you can follow the directions on the medication bottle  At this time I think your chest pain is due to chest wall pain and not to your heart or lungs

## 2021-01-29 ENCOUNTER — Other Ambulatory Visit: Payer: Self-pay | Admitting: Pharmacist

## 2021-01-29 MED ORDER — REPATHA SURECLICK 140 MG/ML ~~LOC~~ SOAJ
SUBCUTANEOUS | 11 refills | Status: DC
Start: 2021-01-29 — End: 2022-02-13

## 2021-05-10 ENCOUNTER — Encounter: Payer: Self-pay | Admitting: Family Medicine

## 2021-07-04 NOTE — Progress Notes (Addendum)
[facility] at [facility] [address], [place], [place] [address] [phone]  Date:  65/78/4696   Name:  [name]   DOB:  23-Jul-1956   MRN:  [id number]  PCP:  Darreld Mclean, MD    Chief Complaint: Hypertension (BP elevations x 3 weeks. In the mornings her readings have been around 140/ 90 and get higher throgh the day. She does have chest tightness and HA's at times.)   History of Present Illness:  [name] is a 65 y.o. very pleasant female patient who presents with the following:  Pt seen today with BP concerns Last visit with myself in June of this year  She has history of colon cancer, recurrent DVT on xarelto, hypertension, prediabetes, stage I thymoma status post thymectomy 2019 She had covid 35 in August of 2021 with significant transaminitis  LFTs returned to normal in June   Today [name] notes her BP is ok in the am- 130- 145/80- 85 However in the evening it might be As 180/100 She has noticed this for about 3 weeks She will notice sx of chest pain and pressure when her BP goes up- she feels like this happens to her daily  Not present now as she feels ok in the am She has tired walking, she has tried "sitting all day"- no difference She does not feel that her chest pain is exertional.  She is not able to tell me how long the chest pain might last She is still taking all her medications as she normally would -taking 60 mg of lisinopril in the morning  Ct coronary 2019: IMPRESSION: 1. Coronary calcium score of 0. This was 0 percentile for age and sex matched control. 2. Normal coronary origin with right dominance. 3. The study is significantly affected by motion, however there is no significant plaque in any of the coronary arteries.   She has not done any other particular cardiac imaging except for echo  She did see cardiology in May to follow-up on HTN  Covid series- not done. Recommended   Pneumonia vaccine- pt declines  Flu shot- pt declines  Colon cancer screening is now due - I asked her to contact Dr Benson Norway to set this up and provided his phone number  Amlodipine 5 Lisinopril 40 Spironolactone 12.5 Xarelto Repatha  Patient Active Problem List   Diagnosis Date Noted   Stroke-like symptoms 03/21/2020   HLD (hyperlipidemia)    COVID-19 virus infection    Elevated liver enzymes    Ischemic stroke ([place])    Statin myopathy 12/29/2019   Prediabetes 05/25/2019   S/P thymectomy 11/24/2017   Bilateral leg pain 07/02/2016   History of colon cancer, stage I 08/17/2015   Varicose 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 08/26/2014   HTN (hypertension) 12/24/2013   Cancer of left colon ([place]) 02/25/2012   DVT (deep venous thrombosis) ([place]) 08/27/2011    Past Medical History:  Diagnosis Date   Bilateral leg pain 07/02/2016   Cancer ([place]) 08/03/2010   colon   DVT (deep venous thrombosis) ([facility])    recurrent; on long term anticoag (Rivaroxaban)   History of colon cancer, stage I 08/17/2015   HLD (hyperlipidemia)    Hypertension    Prediabetes 05/25/2019   Uterine fibroid    Varicose 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 08/26/2014    Past Surgical History:  Procedure Laterality Date   ABDOMINAL HYSTERECTOMY     CHOLECYSTECTOMY  COLON SURGERY     GALLBLADDER SURGERY     MEDIASTERNOTOMY N/A 11/24/2017   Procedure: PARTIAL STERNOTOMY;  Surgeon: [name], MD;  Location: [facility] OR;  Service: Thoracic;  Laterality: N/A;   THYMECTOMY  11/24/2017   Procedure: THYMECTOMY;  Surgeon: [name], MD;  Location: [facility] OR;  Service: Thoracic;;    Social History   Tobacco Use   Smoking status: Never   Smokeless tobacco: Never  Vaping Use   Vaping Use: Never used  Substance Use Topics   Alcohol use: No    Alcohol/week: 0.0 standard drinks   Drug use: No    Family History  Problem Relation Age of Onset   Heart attack Mother    Hypertension  Father     No Known Allergies  Medication list has been reviewed and updated.  Current Outpatient Medications on File Prior to Visit  Medication Sig Dispense Refill   amLODipine (NORVASC) 5 MG tablet Take 1 tablet (5 mg total) by mouth at bedtime. 90 tablet 3   Evolocumab (REPATHA SURECLICK) 956 MG/ML SOAJ Repatha SureClick 387 mg/mL subcutaneous pen injector  INJECT 1 PEN INTO THE SKIN EVERY 14 (FOURTEEN) DAYS. 2 mL 11   lisinopril (ZESTRIL) 40 MG tablet Take 1.5 tablets (60 mg total) by mouth every morning. 136 tablet 3   rivaroxaban (XARELTO) 20 MG TABS tablet Take 1 tablet (20 mg total) by mouth daily with supper. 90 tablet 3   spironolactone (ALDACTONE) 12.5 mg TABS tablet Take 12.5 mg by mouth daily.     Vitamin D, Ergocalciferol, (DRISDOL) 1.25 MG (50000 UNIT) CAPS capsule Take 1 capsule (50,000 Units total) by mouth every 7 (seven) days. 12 capsule 0   No current facility-administered medications on file prior to visit.    Review of Systems:  As per HPI- otherwise negative.   Physical Examination: Vitals:   07/11/21 0937  BP: 120/82  Pulse: (!) 55  Resp: 18  Temp: (!) 97.5 F (36.4 C)  SpO2: 100%   Vitals:   07/11/21 0937  Weight: 190 lb 6.4 oz (86.4 kg)  Height: 5\' 7"  (1.702 m)   Body mass index is 29.82 kg/m. Ideal Body Weight: Weight in (lb) to have BMI = 25: 159.3  GEN: no acute distress. Overweight, tall build, looks well  HEENT: Atraumatic, Normocephalic.  Ears and Nose: No external deformity. CV: RRR, No M/G/R. No JVD. No thrill. No extra heart sounds. PULM: CTA B, no wheezes, crackles, rhonchi. No retractions. No resp. distress. No accessory muscle use. ABD: S, NT, ND, +BS. No rebound. No HSM. EXTR: No c/c/e PSYCH: Normally interactive. Conversant.   EKG: sinus brady with rate 56.  C/w tracing from June no significant change is noted Assessment and Plan: Chest pain, unspecified type - Plan: EKG 12-Lead, Troponin I (High Sensitivity)  Essential  hypertension - Plan: CBC, Basic metabolic panel, TSH  Prediabetes - Plan: Hemoglobin A1c  Mixed hyperlipidemia - Plan: Lipid panel  Patient seen today with concern of blood pressure elevations in the afternoon with associated chest pain and shortness of breath.  As below, her cardiology team reports this is a more chronic concern. I asked her to take lisinopril 40 in the morning and a second 40 mg in the afternoon, continue other medications without changing. Blood work including troponin is pending as above I have asked Lillyanna to let me know if this change in her blood pressure medication routine does not resolve her symptoms Signed Lamar Blinks, MD  Cardiology  team: Former patient of Dr. Meda Coffee, she is now seen most frequently by Ermalinda Barrios PA-C I called and spoke with Ms. Doroteo Glassman reports that [facility] often has concern of chest pain and elevated blood pressures.  Given recent 0 coronary calcium score is unlikely to be cardiac in origin.  She agreed with adjustment of lisinopril, suggested we can also increase spironolactone to 25 if needed.  At this point she does not feel further cardiac work-up is warranted  Received her labs 12/1- message to pt  Results for orders placed or performed in visit on 07/11/21  CBC  Result Value Ref Range   WBC 3.9 (L) 4.0 - 10.5 K/uL   RBC 4.41 3.87 - 5.11 Mil/uL   Platelets 154.0 150.0 - 400.0 K/uL   Hemoglobin 13.5 12.0 - 15.0 g/dL   HCT 41.1 36.0 - 46.0 %   MCV 93.3 78.0 - 100.0 fl   MCHC 32.8 30.0 - 36.0 g/dL   RDW 13.0 11.5 - 85.0 %  Basic metabolic panel  Result Value Ref Range   Sodium 142 135 - 145 mEq/L   Potassium 4.2 3.5 - 5.1 mEq/L   Chloride 107 96 - 112 mEq/L   CO2 29 19 - 32 mEq/L   Glucose, Bld 111 (H) 70 - 99 mg/dL   BUN 14 6 - 23 mg/dL   Creatinine, Ser 1.00 0.40 - 1.20 mg/dL   GFR 58.22 (L) >60.00 mL/min   Calcium 9.3 8.4 - 10.5 mg/dL  Lipid panel  Result Value Ref Range   Cholesterol 153 0 - 200 mg/dL    Triglycerides 121.0 0.0 - 149.0 mg/dL   HDL 58.00 >39.00 mg/dL   VLDL 24.2 0.0 - 40.0 mg/dL   LDL Cholesterol 70 0 - 99 mg/dL   Total CHOL/HDL Ratio 3    NonHDL 94.54   TSH  Result Value Ref Range   TSH 3.59 0.35 - 5.50 uIU/mL  Hemoglobin A1c  Result Value Ref Range   Hgb A1c MFr Bld 6.0 4.6 - 6.5 %  Troponin I (High Sensitivity)  Result Value Ref Range   High Sens Troponin I 4 2 - 17 ng/L

## 2021-07-11 ENCOUNTER — Ambulatory Visit (INDEPENDENT_AMBULATORY_CARE_PROVIDER_SITE_OTHER): Payer: 59 | Admitting: Family Medicine

## 2021-07-11 ENCOUNTER — Other Ambulatory Visit: Payer: Self-pay

## 2021-07-11 VITALS — BP 120/82 | HR 55 | Temp 97.5°F | Resp 18 | Ht 67.0 in | Wt 190.4 lb

## 2021-07-11 DIAGNOSIS — R079 Chest pain, unspecified: Secondary | ICD-10-CM | POA: Diagnosis not present

## 2021-07-11 DIAGNOSIS — R7303 Prediabetes: Secondary | ICD-10-CM

## 2021-07-11 DIAGNOSIS — I10 Essential (primary) hypertension: Secondary | ICD-10-CM

## 2021-07-11 DIAGNOSIS — E782 Mixed hyperlipidemia: Secondary | ICD-10-CM

## 2021-07-11 LAB — CBC
HCT: 41.1 % (ref 36.0–46.0)
Hemoglobin: 13.5 g/dL (ref 12.0–15.0)
MCHC: 32.8 g/dL (ref 30.0–36.0)
MCV: 93.3 fl (ref 78.0–100.0)
Platelets: 154 10*3/uL (ref 150.0–400.0)
RBC: 4.41 Mil/uL (ref 3.87–5.11)
RDW: 13 % (ref 11.5–15.5)
WBC: 3.9 10*3/uL — ABNORMAL LOW (ref 4.0–10.5)

## 2021-07-11 LAB — LIPID PANEL
Cholesterol: 153 mg/dL (ref 0–200)
HDL: 58 mg/dL (ref >39.00)
LDL Cholesterol: 70 mg/dL (ref 0–99)
NonHDL: 94.54
Total CHOL/HDL Ratio: 3
Triglycerides: 121 mg/dL (ref 0.0–149.0)
VLDL: 24.2 mg/dL (ref 0.0–40.0)

## 2021-07-11 LAB — TROPONIN I (HIGH SENSITIVITY): High Sens Troponin I: 4 ng/L (ref 2–17)

## 2021-07-11 LAB — BASIC METABOLIC PANEL
BUN: 14 mg/dL (ref 6–23)
CO2: 29 mEq/L (ref 19–32)
Calcium: 9.3 mg/dL (ref 8.4–10.5)
Chloride: 107 mEq/L (ref 96–112)
Creatinine, Ser: 1 mg/dL (ref 0.40–1.20)
GFR: 58.22 mL/min — ABNORMAL LOW (ref >60.00)
Glucose, Bld: 111 mg/dL — ABNORMAL HIGH (ref 70–99)
Potassium: 4.2 mEq/L (ref 3.5–5.1)
Sodium: 142 mEq/L (ref 135–145)

## 2021-07-11 LAB — TSH: TSH: 3.59 u[IU]/mL (ref 0.35–5.50)

## 2021-07-11 LAB — HEMOGLOBIN A1C: Hgb A1c MFr Bld: 6 % (ref 4.6–6.5)

## 2021-07-11 NOTE — Patient Instructions (Signed)
I think you are due for your colonoscopy- please call Dr Benson Norway and schedule this!   [phone]  For your blood pressure- please take lisinopril 40 mg in the morning and a 2nd dose of 40 mg again in the afternoon.  Please let me know if this does not control your BP better or if you continue to have the chest pain or shortness of breath. I will get in touch with cardiology for you for any further recommendation  Other medications no change   Please consider getting your flu, covid and pneumonia vaccines

## 2021-07-12 ENCOUNTER — Encounter: Payer: Self-pay | Admitting: Family Medicine

## 2021-08-23 ENCOUNTER — Other Ambulatory Visit: Payer: Self-pay | Admitting: Family Medicine

## 2021-08-23 DIAGNOSIS — Z7901 Long term (current) use of anticoagulants: Secondary | ICD-10-CM

## 2021-08-25 NOTE — Progress Notes (Addendum)
[facility] [address], [place], [place] [address] [phone]  Date:  4/91/7915   Name:  [name]   DOB:  1955/09/15   MRN:  [id number]  PCP:  Darreld Mclean, MD    Chief Complaint: GI issues (Ongoing: chest pain, HA, and elevated Bps in the afternoons. Pt has been seen previously for these sxs. )   History of Present Illness:  [name] is a 66 y.o. very pleasant female patient who presents with the following:  Patient seen today with concern of GI symptoms She has history of colon cancer, recurrent DVT on xarelto, hypertension, prediabetes, stage I thymoma status post thymectomy 2019  I saw her most recently in November-at that time she was concerned with blood pressure running too high sometimes in the evening and also chronic chest pain At that time we adjusted her blood pressure medication-I discussed chest pain with her cardiology team who noted chest pain is chronic and recent coronary calcium score of 0  Most recent colonoscopy was in 2012- pt reports she called Dr Benson [place] and they told her she was due tin 2026 We must not have her most recent colon on her chart- we will request a copy of her most recent coloscopy   She is again concerned that her blood pressures are high in the evenings She is checking her blood pressure because she will have a headache and her chronic chest pressure which we have discussed and evaluated in the past - she notes this chest pain for about 2 years now  It is not changing or getting worse She notes the chest pain is not present now  She also notes epigastric pain which occurs after eating or at night - she has noticed this for maybe a year  It can vary in time duration and severity, she cannot really say if it is related to eating No vomiting  She is taking 40 mg of lisinopril BID and amlodipine 2.5 twice a day   She last saw cardiology about a year ago-her  previous primary cardiologist moved away Coronary calcium was 0 in 2019  She has declined COVID and pneumonia immunizations Patient Active Problem List   Diagnosis Date Noted   Stroke-like symptoms 03/21/2020   HLD (hyperlipidemia)    COVID-19 virus infection    Elevated liver enzymes    Ischemic stroke ([place])    Statin myopathy 12/29/2019   Prediabetes 05/25/2019   S/P thymectomy 11/24/2017   Bilateral leg pain 07/02/2016   History of colon cancer, stage I 08/17/2015   Varicose 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 08/26/2014   HTN (hypertension) 12/24/2013   Cancer of left colon ([place]) 02/25/2012   DVT (deep venous thrombosis) ([place]) 08/27/2011    Past Medical History:  Diagnosis Date   Bilateral leg pain 07/02/2016   Cancer ([place]) 08/03/2010   colon   DVT (deep venous thrombosis) ([facility])    recurrent; on long term anticoag (Rivaroxaban)   History of colon cancer, stage I 08/17/2015   HLD (hyperlipidemia)    Hypertension    Prediabetes 05/25/2019   Uterine fibroid    Varicose 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 08/26/2014    Past Surgical History:  Procedure Laterality Date   ABDOMINAL HYSTERECTOMY     CHOLECYSTECTOMY     COLON SURGERY     GALLBLADDER SURGERY     MEDIASTERNOTOMY N/A 11/24/2017   Procedure: PARTIAL STERNOTOMY;  Surgeon: [name], MD;  Location: [facility] OR;  Service: Thoracic;  Laterality: N/A;   THYMECTOMY  11/24/2017   Procedure: THYMECTOMY;  Surgeon: [name], MD;  Location: [facility] OR;  Service: Thoracic;;    Social History   Tobacco Use   Smoking status: Never   Smokeless tobacco: Never  Vaping Use   Vaping Use: Never used  Substance Use Topics   Alcohol use: No    Alcohol/week: 0.0 standard drinks   Drug use: No    Family History  Problem Relation Age of Onset   Heart attack Mother    Hypertension Father     No Known Allergies  Medication list has been reviewed and updated.  Current Outpatient Medications on  File Prior to Visit  Medication Sig Dispense Refill   amLODipine (NORVASC) 5 MG tablet Take 1 tablet (5 mg total) by mouth at bedtime. 90 tablet 3   Evolocumab (REPATHA SURECLICK) 062 MG/ML SOAJ Repatha SureClick 376 mg/mL subcutaneous pen injector  INJECT 1 PEN INTO THE SKIN EVERY 14 (FOURTEEN) DAYS. 2 mL 11   lisinopril (ZESTRIL) 40 MG tablet Take 1.5 tablets (60 mg total) by mouth every morning. 136 tablet 3   spironolactone (ALDACTONE) 12.5 mg TABS tablet Take 12.5 mg by mouth daily.     Vitamin D, Ergocalciferol, (DRISDOL) 1.25 MG (50000 UNIT) CAPS capsule Take 1 capsule (50,000 Units total) by mouth every 7 (seven) days. 12 capsule 0   XARELTO 20 MG TABS tablet TAKE 1 TABLET(20 MG) BY MOUTH DAILY WITH SUPPER 90 tablet 3   No current facility-administered medications on file prior to visit.    Review of Systems:  As per HPI- otherwise negative.   Physical Examination: Vitals:   08/27/21 1541  BP: (!) 142/84  Pulse: 63  Resp: 18  Temp: 98.3 F (36.8 C)  SpO2: 98%   Vitals:   08/27/21 1541  Weight: 189 lb 12.8 oz (86.1 kg)  Height: 5' 7.72" (1.72 m)   Body mass index is 29.1 kg/m. Ideal Body Weight: Weight in (lb) to have BMI = 25: 162.7  GEN: no acute distress.  Overweight, looks well HEENT: Atraumatic, Normocephalic.  Ears and Nose: No external deformity. CV: RRR, No M/G/R. No JVD. No thrill. No extra heart sounds. PULM: CTA B, no wheezes, crackles, rhonchi. No retractions. No resp. distress. No accessory muscle use. ABD: S, NT, ND, +BS. No rebound. No HSM. EXTR: No c/c/e PSYCH: Normally interactive. Conversant.  I am able to easily reproduce her chest discomfort by pressing on her chest wall  Assessment and Plan: Labile blood pressure - Plan: 24 hour blood pressure monitor  Epigastric pain - Plan: H. pylori breath test, Comp Met (CMET), omeprazole (PRILOSEC) 40 MG capsule, Troponin I  Chronic chest pain - Plan: Ambulatory referral to Cardiology, Troponin I,  CANCELED: Troponin I (High Sensitivity)  Patient seen today for a couple of recurrent concerns.  She has noted labile blood pressure with higher readings in the evening which are quite concerning to her.  I will order 24-hour blood pressure monitor for further evaluation Also, she has concern of chronic chest pain.  Her cardiologist has moved away.  I will check a troponin today for completeness, referral made so she can establish with a new cardiologist She will let me know if any worsening in the meantime Finally, she notes epigastric pain.  Her gastroenterologist is Dr. Adine Madura thought she was due for colonoscopy but she must of had one in the interim  that I am not aware of.  I will track down these records.  Otherwise in the meantime we will do an H. pylori breath test, CMP, and use omeprazole for 2 to 4 weeks  Signed Lamar Blinks, MD  Received labs, 1/17 Patient is positive for H. Pylori Gave her a call, will call in treatment for same She is already on omeprazole so just needs the abx added She states understanding  Results for orders placed or performed in visit on 08/27/21  H. pylori breath test  Result Value Ref Range   H. pylori Breath Test DETECTED (A) NOT DETECTED  Comp Met (CMET)  Result Value Ref Range   Sodium 143 135 - 145 mEq/L   Potassium 4.4 3.5 - 5.1 mEq/L   Chloride 106 96 - 112 mEq/L   CO2 29 19 - 32 mEq/L   Glucose, Bld 101 (H) 70 - 99 mg/dL   BUN 20 6 - 23 mg/dL   Creatinine, Ser 1.03 0.40 - 1.20 mg/dL   Total Bilirubin 0.6 0.2 - 1.2 mg/dL   Alkaline Phosphatase 91 39 - 117 U/L   AST 24 0 - 37 U/L   ALT 37 (H) 0 - 35 U/L   Total Protein 6.7 6.0 - 8.3 g/dL   Albumin 4.7 3.5 - 5.2 g/dL   GFR 56.14 (L) >60.00 mL/min   Calcium 9.6 8.4 - 10.5 mg/dL  Troponin I  Result Value Ref Range   Troponin I 3 < OR = 47 ng/L

## 2021-08-27 ENCOUNTER — Ambulatory Visit (INDEPENDENT_AMBULATORY_CARE_PROVIDER_SITE_OTHER): Payer: 59 | Admitting: Family Medicine

## 2021-08-27 VITALS — BP 142/84 | HR 63 | Temp 98.3°F | Resp 18 | Ht 67.72 in | Wt 189.8 lb

## 2021-08-27 DIAGNOSIS — R079 Chest pain, unspecified: Secondary | ICD-10-CM

## 2021-08-27 DIAGNOSIS — R0989 Other specified symptoms and signs involving the circulatory and respiratory systems: Secondary | ICD-10-CM | POA: Diagnosis not present

## 2021-08-27 DIAGNOSIS — A048 Other specified bacterial intestinal infections: Secondary | ICD-10-CM | POA: Diagnosis not present

## 2021-08-27 DIAGNOSIS — R1013 Epigastric pain: Secondary | ICD-10-CM

## 2021-08-27 DIAGNOSIS — G8929 Other chronic pain: Secondary | ICD-10-CM

## 2021-08-27 MED ORDER — OMEPRAZOLE 40 MG PO CPDR: 40.0000 mg | DELAYED_RELEASE_CAPSULE | Freq: Every day | ORAL | 3 refills | Status: DC

## 2021-08-27 NOTE — Patient Instructions (Addendum)
It was good to see you today!  Plan- we will set up a 24 hour blood pressure monitor for you We will have you see cardiology- Dr Meda Coffee moved away but we will find you a new provider Labs pending today Start on omeprazole 40 mg daily for your stomach- take for 2-4 weeks

## 2021-08-28 ENCOUNTER — Encounter: Payer: Self-pay | Admitting: Family Medicine

## 2021-08-28 LAB — COMPREHENSIVE METABOLIC PANEL
ALT: 37 U/L — ABNORMAL HIGH (ref 0–35)
AST: 24 U/L (ref 0–37)
Albumin: 4.7 g/dL (ref 3.5–5.2)
Alkaline Phosphatase: 91 U/L (ref 39–117)
BUN: 20 mg/dL (ref 6–23)
CO2: 29 mEq/L (ref 19–32)
Calcium: 9.6 mg/dL (ref 8.4–10.5)
Chloride: 106 mEq/L (ref 96–112)
Creatinine, Ser: 1.03 mg/dL (ref 0.40–1.20)
GFR: 56.14 mL/min — ABNORMAL LOW (ref >60.00)
Glucose, Bld: 101 mg/dL — ABNORMAL HIGH (ref 70–99)
Potassium: 4.4 mEq/L (ref 3.5–5.1)
Sodium: 143 mEq/L (ref 135–145)
Total Bilirubin: 0.6 mg/dL (ref 0.2–1.2)
Total Protein: 6.7 g/dL (ref 6.0–8.3)

## 2021-08-28 LAB — TROPONIN I: Troponin I: 3 ng/L (ref <=47)

## 2021-08-28 LAB — H. PYLORI BREATH TEST: H. pylori Breath Test: DETECTED — AB

## 2021-08-28 MED ORDER — CLARITHROMYCIN 500 MG PO TABS: 500.0000 mg | ORAL_TABLET | Freq: Two times a day (BID) | ORAL | 0 refills | Status: DC

## 2021-08-28 MED ORDER — AMOXICILLIN 500 MG PO CAPS: 1000.0000 mg | ORAL_CAPSULE | Freq: Two times a day (BID) | ORAL | 0 refills | Status: DC

## 2021-08-28 NOTE — Addendum Note (Signed)
Addended by: Lamar Blinks C on: 08/28/2021 03:37 PM   Modules accepted: Orders

## 2021-09-03 LAB — HEMOGLOBIN A1C: Hemoglobin A1C: 5.5

## 2021-09-04 NOTE — Progress Notes (Signed)
Referring-Jessica Copland, MD Reason for referral-chest pain  HPI: 66 year old female for evaluation of chest pain at request of Lamar Blinks, MD.  Patient previously followed by Dr. Meda Coffee.  This is my first time seeing the patient.  Also with history of hypertension, recurrent DVT on Xarelto, hyperlipidemia, previous mediastinal mass status post thymectomy.  Coronary CTA April 2019 showed calcium score 0 with normal coronary arteries.  Echocardiogram August 2021 showed normal LV function with grade 1 diastolic dysfunction.  Venous Dopplers August 2021 showed chronic DVT involving right peroneal vein and chronic DVT left peroneal vein.  MRA August 2021 showed no high-grade narrowing, dissection or aneurysm within the neck.  Since last seen she denies dyspnea.  She states her blood sugar runs high in the evenings.  She has chest pain that is in the left breast area.  It occurs both with exertion and at rest.  No associated symptoms.  Typically lasts up to 1 hour and resolved spontaneously.  Difficult to know how long the pain has been going on.  Current Outpatient Medications  Medication Sig Dispense Refill   amLODipine (NORVASC) 5 MG tablet Take 1 tablet (5 mg total) by mouth at bedtime. 90 tablet 3   amoxicillin (AMOXIL) 500 MG capsule Take 2 capsules (1,000 mg total) by mouth 2 (two) times daily. 56 capsule 0   clarithromycin (BIAXIN) 500 MG tablet Take 1 tablet (500 mg total) by mouth 2 (two) times daily. 28 tablet 0   Evolocumab (REPATHA SURECLICK) 347 MG/ML SOAJ Repatha SureClick 425 mg/mL subcutaneous pen injector  INJECT 1 PEN INTO THE SKIN EVERY 14 (FOURTEEN) DAYS. 2 mL 11   HYDROcodone-acetaminophen (NORCO) 10-325 MG tablet hydrocodone 10 mg-acetaminophen 325 mg tablet     lisinopril (ZESTRIL) 40 MG tablet Take 1.5 tablets (60 mg total) by mouth every morning. 136 tablet 3   omeprazole (PRILOSEC) 40 MG capsule Take 1 capsule (40 mg total) by mouth daily. 30 capsule 3   spironolactone  (ALDACTONE) 12.5 mg TABS tablet Take 12.5 mg by mouth daily.     Vitamin D, Ergocalciferol, (DRISDOL) 1.25 MG (50000 UNIT) CAPS capsule Take 1 capsule (50,000 Units total) by mouth every 7 (seven) days. 12 capsule 0   XARELTO 20 MG TABS tablet TAKE 1 TABLET(20 MG) BY MOUTH DAILY WITH SUPPER 90 tablet 3   No current facility-administered medications for this visit.    No Known Allergies   Past Medical History:  Diagnosis Date   Bilateral leg pain 07/02/2016   Cancer ([place]) 08/03/2010   colon   DVT (deep venous thrombosis) ([facility])    recurrent; on long term anticoag (Rivaroxaban)   History of colon cancer, stage I 08/17/2015   HLD (hyperlipidemia)    Hypertension    Prediabetes 05/25/2019   Uterine fibroid    Varicose 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 08/26/2014    Past Surgical History:  Procedure Laterality Date   ABDOMINAL HYSTERECTOMY     CHOLECYSTECTOMY     COLON SURGERY     GALLBLADDER SURGERY     MEDIASTERNOTOMY N/A 11/24/2017   Procedure: PARTIAL STERNOTOMY;  Surgeon: [name], MD;  Location: [facility];  Service: Thoracic;  Laterality: N/A;   THYMECTOMY  11/24/2017   Procedure: THYMECTOMY;  Surgeon: [name], MD;  Location: [facility] OR;  Service: Thoracic;;    Social History   Socioeconomic History   Marital status: Married    Spouse name: Not on file   Number of children: 1  Years of education: Bachelors   Highest education level: Not on file  Occupational History   Not on file  Tobacco Use   Smoking status: Never   Smokeless tobacco: Never  Vaping Use   Vaping Use: Never used  Substance and Sexual Activity   Alcohol use: No    Alcohol/week: 0.0 standard drinks   Drug use: No   Sexual activity: Not on file  Other Topics Concern   Not on file  Social History Narrative   Lives at home with husband and son.   Right-handed.   No caffeine use.   Social Determinants of Health   Financial Resource Strain: Not on file  Food  Insecurity: Not on file  Transportation Needs: Not on file  Physical Activity: Not on file  Stress: Not on file  Social Connections: Not on file  Intimate Partner Violence: Not on file    Family History  Problem Relation Age of Onset   Heart attack Mother    Hypertension Father     ROS: no fevers or chills, productive cough, hemoptysis, dysphasia, odynophagia, melena, hematochezia, dysuria, hematuria, rash, seizure activity, orthopnea, PND, pedal edema, claudication. Remaining systems are negative.  Physical Exam:   Blood pressure (!) 142/86, pulse 66, height 5' 7.72" (1.72 m), weight 192 lb (87.1 kg), SpO2 99 %.  General:  Well developed/well nourished in NAD Skin warm/dry Patient not depressed No peripheral clubbing Back-normal HEENT-normal/normal eyelids Neck supple/normal carotid upstroke bilaterally; no bruits; no JVD; no thyromegaly chest - CTA/ normal expansion CV - RRR/normal S1 and S2; no murmurs, rubs or gallops;  PMI nondisplaced Abdomen -NT/ND, no HSM, no mass, + bowel sounds, no bruit 2+ femoral pulses, no bruits Ext-no edema, chords, 2+ DP Neuro-grossly nonfocal  ECG -July 11, 2021-sinus bradycardia with nonspecific ST changes.  Today's electrocardiogram shows normal sinus rhythm at a rate of 66 and nonspecific ST changes not significantly changed compared to previous.  Personally reviewed  A/P  1 chest pain-symptoms atypical and no diagnostic ECG changes.  We will plan cardiac CTA to exclude obstructive coronary disease.  2 hypertension-patient states she is taking lisinopril 40 mg twice daily.  I will decrease this to 40 mg daily.  Increase amlodipine to 10 mg daily.  Follow blood pressure and advance medications as needed.  3 history of recurrent DVT-continue Xarelto.  4 hyperlipidemia-continue Repatha.  She is intolerant to statins.  Note approximately 20 minutes spent reviewing patient's previous records prior to her arrival.  Kirk Ruths,  MD

## 2021-09-05 ENCOUNTER — Encounter: Payer: Self-pay | Admitting: Cardiology

## 2021-09-05 ENCOUNTER — Ambulatory Visit (INDEPENDENT_AMBULATORY_CARE_PROVIDER_SITE_OTHER): Payer: 59 | Admitting: Cardiology

## 2021-09-05 ENCOUNTER — Other Ambulatory Visit: Payer: Self-pay

## 2021-09-05 VITALS — BP 142/86 | HR 66 | Ht 67.72 in | Wt 192.0 lb

## 2021-09-05 DIAGNOSIS — I10 Essential (primary) hypertension: Secondary | ICD-10-CM

## 2021-09-05 DIAGNOSIS — R072 Precordial pain: Secondary | ICD-10-CM

## 2021-09-05 DIAGNOSIS — Z86718 Personal history of other venous thrombosis and embolism: Secondary | ICD-10-CM

## 2021-09-05 DIAGNOSIS — E785 Hyperlipidemia, unspecified: Secondary | ICD-10-CM | POA: Diagnosis not present

## 2021-09-05 MED ORDER — AMLODIPINE BESYLATE 10 MG PO TABS: 10.0000 mg | ORAL_TABLET | Freq: Every day | ORAL | 3 refills | Status: DC

## 2021-09-05 MED ORDER — LISINOPRIL 40 MG PO TABS: 60.0000 mg | ORAL_TABLET | Freq: Every day | ORAL | 3 refills | Status: DC

## 2021-09-05 MED ORDER — METOPROLOL TARTRATE 100 MG PO TABS: ORAL_TABLET | ORAL | 0 refills | Status: DC

## 2021-09-05 NOTE — Patient Instructions (Signed)
Medication Instructions:   CHANGE LISINOPRIL TO 40 MG ONCE DAILY  INCREASE AMLODIPINE TO 10 MG ONCE DAILY= 2 OF THE 5 MG TABLETS ONCE DAILY  *If you need a refill on your cardiac medications before your next appointment, please call your pharmacy*  Testing/Procedures:  Your cardiac CT will be scheduled at   [facility] [place],  [address] [phone]  If scheduled at [facility], please arrive at the [facility] main entrance (entrance A) of [facility] 30 minutes prior to test start time. You can use the FREE valet parking offered at the main entrance (encouraged to control the heart rate for the test) Proceed to the [facility] Radiology Department (first floor) to check-in and test prep.  If scheduled at [facility], please arrive 15 mins early for check-in and test prep.  Please follow these instructions carefully (unless otherwise directed):   On the Night Before the Test: Be sure to Drink plenty of water. Do not consume any caffeinated/decaffeinated beverages or chocolate 12 hours prior to your test. Do not take any antihistamines 12 hours prior to your test.   On the Day of the Test: Drink plenty of water until 1 hour prior to the test. Do not eat any food 4 hours prior to the test. You may take your regular medications prior to the test.  Take metoprolol (Lopressor) 100 MG two hours prior to test. HOLD Furosemide/Hydrochlorothiazide morning of the test. FEMALES- please wear underwire-free bra if available, avoid dresses & tight clothing      After the Test: Drink plenty of water. After receiving IV contrast, you may experience a mild flushed feeling. This is normal. On occasion, you may experience a mild rash up to 24 hours after the test. This is not dangerous. If this occurs, you can take Benadryl 25 mg and increase your fluid intake. If you experience trouble breathing, this can be  serious. If it is severe call 911 IMMEDIATELY. If it is mild, please call our office. If you take any of these medications: Glipizide/Metformin, Avandament, Glucavance, please do not take 48 hours after completing test unless otherwise instructed.  We will call to schedule your test 2-4 weeks out understanding that some insurance companies will need an authorization prior to the service being performed.   For non-scheduling related questions, please contact the cardiac imaging nurse navigator should you have any questions/concerns: Marchia Bond, Cardiac Imaging Nurse Navigator Gordy Clement, Cardiac Imaging Nurse Navigator [facility] Direct Office Dial: [phone]   For scheduling needs, including cancellations and rescheduling, please call [place], [phone].    Follow-Up: At [facility], you and your health needs are our priority.  As part of our continuing mission to provide you with exceptional heart care, we have created designated Provider Care Teams.  These Care Teams include your primary Cardiologist (physician) and Advanced Practice Providers (APPs -  Physician Assistants and Nurse Practitioners) who all work together to provide you with the care you need, when you need it.  We recommend signing up for the patient portal called "MyChart".  Sign up information is provided on this After Visit Summary.  MyChart is used to connect with patients for Virtual Visits (Telemedicine).  Patients are able to view lab/test results, encounter notes, upcoming appointments, etc.  Non-urgent messages can be sent to your provider as well.   To learn more about what you can do with MyChart, go to [email or web address].  Your next appointment:   3 month(s)  The format for your next appointment:   In Person  Provider:   Kirk Ruths MD

## 2021-09-11 ENCOUNTER — Telehealth (HOSPITAL_COMMUNITY): Payer: Self-pay | Admitting: *Deleted

## 2021-09-11 NOTE — Telephone Encounter (Signed)
Attempted to call patient regarding upcoming cardiac CT appointment. °Left message on voicemail with name and callback number ° °Francisca Harbuck RN Navigator Cardiac Imaging °[facility] °[phone] Office °[phone] Cell ° °

## 2021-09-12 ENCOUNTER — Telehealth (HOSPITAL_COMMUNITY): Payer: Self-pay | Admitting: *Deleted

## 2021-09-12 NOTE — Telephone Encounter (Signed)
Patient returning call regarding upcoming cardiac imaging study; pt verbalizes understanding of appt date/time, parking situation and where to check in, pre-test NPO status and medications ordered, and verified current allergies; name and call back number provided for further questions should they arise  Gordy Clement RN Navigator Cardiac Imaging [facility] [phone] office [phone] cell  Patient to take 50mg  metoprolol tartrate two hours prior to cardiac CT scan. She is aware to arrive by 7:15am for her 7:45am scan.

## 2021-09-13 ENCOUNTER — Other Ambulatory Visit: Payer: Self-pay

## 2021-09-13 ENCOUNTER — Encounter (HOSPITAL_COMMUNITY): Payer: Self-pay

## 2021-09-13 ENCOUNTER — Ambulatory Visit (HOSPITAL_COMMUNITY)
Admission: RE | Admit: 2021-09-13 | Discharge: 2021-09-13 | Disposition: A | Discharge status: Home / Self Care | Payer: 59 | Source: Ambulatory Visit | Attending: Cardiology | Admitting: Cardiology

## 2021-09-13 DIAGNOSIS — R072 Precordial pain: Secondary | ICD-10-CM | POA: Diagnosis present

## 2021-09-13 DIAGNOSIS — I700 Atherosclerosis of aorta: Secondary | ICD-10-CM

## 2021-09-13 MED ORDER — NITROGLYCERIN 0.4 MG SL SUBL
SUBLINGUAL_TABLET | SUBLINGUAL | Status: AC
  Administered 2021-09-13: 0.8 mg via SUBLINGUAL
  Filled 2021-09-13: qty 2

## 2021-09-13 MED ORDER — IOHEXOL 350 MG/ML SOLN
95.0000 mL | Freq: Once | INTRAVENOUS | Status: AC | PRN
  Administered 2021-09-13: 95 mL via INTRAVENOUS

## 2021-09-13 MED ORDER — NITROGLYCERIN 0.4 MG SL SUBL: 0.8000 mg | SUBLINGUAL_TABLET | Freq: Once | SUBLINGUAL | Status: AC

## 2021-11-14 NOTE — Progress Notes (Signed)
? ? ? ? ?HPI: FU CP.  Also with history of hypertension, recurrent DVT on Xarelto, hyperlipidemia, previous mediastinal mass status post thymectomy.  Echocardiogram August 2021 showed normal LV function with grade 1 diastolic dysfunction.  Venous Dopplers August 2021 showed chronic DVT involving right peroneal vein and chronic DVT left peroneal vein.  MRA August 2021 showed no high-grade narrowing, dissection or aneurysm within the neck.  Cardiac CTA February 2023 showed calcium score 0 and no coronary disease.  Since last seen she has had occasional chest pain that is not exertional.  No radiation.  Resolves spontaneously.  No associated symptoms.  She denies dyspnea, palpitations or syncope.  Her blood pressure is running high at home. ? ?Current Outpatient Medications  ?Medication Sig Dispense Refill  ? amLODipine (NORVASC) 10 MG tablet Take 1 tablet (10 mg total) by mouth at bedtime. 90 tablet 3  ? Evolocumab (REPATHA SURECLICK) 940 MG/ML SOAJ Repatha SureClick 768 mg/mL subcutaneous pen injector  INJECT 1 PEN INTO THE SKIN EVERY 14 (FOURTEEN) DAYS. 2 mL 11  ? lisinopril (ZESTRIL) 40 MG tablet Take 1.5 tablets (60 mg total) by mouth daily. 90 tablet 3  ? XARELTO 20 MG TABS tablet TAKE 1 TABLET(20 MG) BY MOUTH DAILY WITH SUPPER 90 tablet 3  ? ?No current facility-administered medications for this visit.  ? ? ? ?Past Medical History:  ?Diagnosis Date  ? Bilateral leg pain 07/02/2016  ? Cancer ([place]) 08/03/2010  ? colon  ? DVT (deep venous thrombosis) ([place])   ? recurrent; on long term anticoag (Rivaroxaban)  ? History of colon cancer, stage I 08/17/2015  ? HLD (hyperlipidemia)   ? Hypertension   ? Prediabetes 05/25/2019  ? Uterine fibroid   ? Varicose 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 08/26/2014  ? ? ?Past Surgical History:  ?Procedure Laterality Date  ? ABDOMINAL HYSTERECTOMY    ? CHOLECYSTECTOMY    ? COLON SURGERY    ? GALLBLADDER SURGERY    ? MEDIASTERNOTOMY N/A 11/24/2017  ? Procedure: PARTIAL STERNOTOMY;   Surgeon: [name], MD;  Location: [place];  Service: Thoracic;  Laterality: N/A;  ? THYMECTOMY  11/24/2017  ? Procedure: THYMECTOMY;  Surgeon: [name], MD;  Location: [place];  Service: Thoracic;;  ? ? ?Social History  ? ?Socioeconomic History  ? Marital status: Married  ?  Spouse name: Not on file  ? Number of children: 1  ? Years of education: Bachelors  ? Highest education level: Not on file  ?Occupational History  ? Not on file  ?Tobacco Use  ? Smoking status: Never  ? Smokeless tobacco: Never  ?Vaping Use  ? Vaping Use: Never used  ?Substance and Sexual Activity  ? Alcohol use: No  ?  Alcohol/week: 0.0 standard drinks  ? Drug use: No  ? Sexual activity: Not on file  ?Other Topics Concern  ? Not on file  ?Social History Narrative  ? Lives at home with husband and son.  ? Right-handed.  ? No caffeine use.  ? ?Social Determinants of Health  ? ?Financial Resource Strain: Not on file  ?Food Insecurity: Not on file  ?Transportation Needs: Not on file  ?Physical Activity: Not on file  ?Stress: Not on file  ?Social Connections: Not on file  ?Intimate Partner Violence: Not on file  ? ? ?Family History  ?Problem Relation Age of Onset  ? Heart attack Mother   ? Hypertension Father   ? ? ?ROS: no fevers or chills, productive cough,  hemoptysis, dysphasia, odynophagia, melena, hematochezia, dysuria, hematuria, rash, seizure activity, orthopnea, PND, pedal edema, claudication. Remaining systems are negative. ? ?Physical Exam: ?Well-developed well-nourished in no acute distress.  ?Skin is warm and dry.  ?HEENT is normal.  ?Neck is supple.  ?Chest is clear to auscultation with normal expansion.  ?Cardiovascular exam is regular rate and rhythm.  ?Abdominal exam nontender or distended. No masses palpated. ?Extremities show no edema. ?neuro grossly intact ? ?A/P ? ?1 chest pain-CTA showed no coronary disease.  We will not pursue further cardiac evaluation at this point. ? ?2 hypertension-blood pressure is  elevated; add chlorthalidone 12.5 mg daily.  Follow blood pressure and adjust medications as needed.  Check potassium and renal function in 1 week. ? ?3 hyperlipidemia-intolerant to statins.  Continue Repatha at present dose. ? ?4 recurrent DVT-continue Xarelto. ? ?Kirk Ruths, MD ? ? ? ?

## 2021-11-28 ENCOUNTER — Encounter: Payer: Self-pay | Admitting: Cardiology

## 2021-11-28 ENCOUNTER — Ambulatory Visit (INDEPENDENT_AMBULATORY_CARE_PROVIDER_SITE_OTHER): Payer: 59 | Admitting: Cardiology

## 2021-11-28 VITALS — BP 148/72 | HR 71 | Ht 67.0 in | Wt 190.0 lb

## 2021-11-28 DIAGNOSIS — I10 Essential (primary) hypertension: Secondary | ICD-10-CM | POA: Diagnosis not present

## 2021-11-28 DIAGNOSIS — I82403 Acute embolism and thrombosis of unspecified deep veins of lower extremity, bilateral: Secondary | ICD-10-CM | POA: Diagnosis not present

## 2021-11-28 DIAGNOSIS — R072 Precordial pain: Secondary | ICD-10-CM

## 2021-11-28 DIAGNOSIS — E785 Hyperlipidemia, unspecified: Secondary | ICD-10-CM | POA: Diagnosis not present

## 2021-11-28 MED ORDER — CHLORTHALIDONE 25 MG PO TABS: 12.5000 mg | ORAL_TABLET | Freq: Every day | ORAL | 3 refills | Status: DC

## 2021-11-28 NOTE — Patient Instructions (Signed)
Medication Instructions:  ? ?START CHLORTHALIDONE 12.5 MG ONCE DAILY= 1/2 OF THE 25 MG TABLET ONCE DAILY ? ?*If you need a refill on your cardiac medications before your next appointment, please call your pharmacy* ? ? ?Lab Work: ? ?Your physician recommends that you return for lab work in: ONE WEEK-DO NOT NEED TO FAST-2ND FLOOR IN THE [place] [facility]ICE-[address] ? ?If you have labs (blood work) drawn today and your tests are completely normal, you will receive your results only by: ?MyChart Message (if you have MyChart) OR ?A paper copy in the mail ?If you have any lab test that is abnormal or we need to change your treatment, we will call you to review the results. ? ? ?Follow-Up: ?At [facility], you and your health needs are our priority.  As part of our continuing mission to provide you with exceptional heart care, we have created designated Provider Care Teams.  These Care Teams include your primary Cardiologist (physician) and Advanced Practice Providers (APPs -  Physician Assistants and Nurse Practitioners) who all work together to provide you with the care you need, when you need it. ? ?We recommend signing up for the patient portal called "MyChart".  Sign up information is provided on this After Visit Summary.  MyChart is used to connect with patients for Virtual Visits (Telemedicine).  Patients are able to view lab/test results, encounter notes, upcoming appointments, etc.  Non-urgent messages can be sent to your provider as well.   ?To learn more about what you can do with MyChart, go to [email or web address].   ? ?Your next appointment:   ?12 month(s) ? ?The format for your next appointment:   ?In Person ? ?Provider:   ?Kirk Ruths, MD  ? ? ? ? ?Important Information About Sugar ? ? ? ? ?  ?

## 2021-12-06 LAB — BASIC METABOLIC PANEL
BUN/Creatinine Ratio: 17 (ref 12–28)
BUN: 18 mg/dL (ref 8–27)
CO2: 27 mmol/L (ref 20–29)
Calcium: 9.8 mg/dL (ref 8.7–10.3)
Chloride: 103 mmol/L (ref 96–106)
Creatinine, Ser: 1.03 mg/dL — ABNORMAL HIGH (ref 0.57–1.00)
Glucose: 118 mg/dL — ABNORMAL HIGH (ref 70–99)
Potassium: 3.9 mmol/L (ref 3.5–5.2)
Sodium: 145 mmol/L — ABNORMAL HIGH (ref 134–144)
eGFR: 59 mL/min/{1.73_m2} — ABNORMAL LOW (ref >59)

## 2021-12-19 ENCOUNTER — Ambulatory Visit: Payer: 59 | Admitting: Cardiology

## 2022-01-13 ENCOUNTER — Other Ambulatory Visit: Payer: Self-pay | Admitting: Family Medicine

## 2022-01-13 DIAGNOSIS — I10 Essential (primary) hypertension: Secondary | ICD-10-CM

## 2022-01-16 ENCOUNTER — Telehealth: Payer: Self-pay

## 2022-01-16 NOTE — Telephone Encounter (Signed)
Left vm to return call.     Per Dr Lorelei Pont:   "please give her a call and schedule her to see me next week- she went for a PT eval and her BP was too high to complete the testing.  would like to check on her, thank you."

## 2022-02-13 ENCOUNTER — Other Ambulatory Visit: Payer: Self-pay | Admitting: Pharmacist

## 2022-02-13 MED ORDER — REPATHA SURECLICK 140 MG/ML ~~LOC~~ SOAJ: SUBCUTANEOUS | 11 refills | Status: DC

## 2022-03-13 ENCOUNTER — Telehealth: Payer: Self-pay | Admitting: Family Medicine

## 2022-03-13 NOTE — Telephone Encounter (Signed)
Left message for patient to call back and schedule Medicare Annual Wellness Visit (AWV).   Please offer to do virtually or by telephone.  Left office number and my jabber [phone].  AWVI eligible as of 08/12/2021  Please schedule at anytime with Nurse Health Advisor.

## 2022-03-14 ENCOUNTER — Other Ambulatory Visit (HOSPITAL_BASED_OUTPATIENT_CLINIC_OR_DEPARTMENT_OTHER): Payer: Self-pay | Admitting: Family Medicine

## 2022-03-14 DIAGNOSIS — Z1231 Encounter for screening mammogram for malignant neoplasm of breast: Secondary | ICD-10-CM

## 2022-03-18 ENCOUNTER — Ambulatory Visit (HOSPITAL_BASED_OUTPATIENT_CLINIC_OR_DEPARTMENT_OTHER)
Admission: RE | Admit: 2022-03-18 | Discharge: 2022-03-18 | Disposition: A | Discharge status: Home / Self Care | Payer: 59 | Source: Ambulatory Visit | Attending: Family Medicine | Admitting: Family Medicine

## 2022-03-18 ENCOUNTER — Encounter (HOSPITAL_BASED_OUTPATIENT_CLINIC_OR_DEPARTMENT_OTHER): Payer: Self-pay

## 2022-03-18 DIAGNOSIS — Z1231 Encounter for screening mammogram for malignant neoplasm of breast: Secondary | ICD-10-CM | POA: Insufficient documentation

## 2022-03-31 LAB — HEMOGLOBIN A1C: Hemoglobin A1C: 5.5

## 2022-04-01 ENCOUNTER — Ambulatory Visit: Payer: 59

## 2022-04-01 NOTE — Progress Notes (Deleted)
Subjective:   [name] is a 66 y.o. female who presents for an Initial Medicare Annual Wellness Visit.  I connected with Kollins today by telephone and verified that I am speaking with the correct person using two identifiers. Location patient: home Location provider: work Persons participating in the virtual visit: patient, Marine scientist.    I discussed the limitations, risks, security and privacy concerns of performing an evaluation and management service by telephone and the availability of in person appointments. I also discussed with the patient that there may be a patient responsible charge related to this service. The patient expressed understanding and verbally consented to this telephonic visit.    Interactive audio and video telecommunications were attempted between this provider and patient, however failed, due to patient having technical difficulties OR patient did not have access to video capability.  We continued and completed visit with audio only.  Some vital signs may be absent or patient reported.   Time Spent with patient on telephone encounter: *** minutes   Review of Systems    ***       Objective:    There were no vitals filed for this visit. There is no height or weight on file to calculate BMI.     04/04/2020    9:28 PM 03/21/2020   12:27 AM 03/04/2020   11:13 AM 12/25/2017    3:41 PM 11/24/2017    5:50 AM 11/20/2017    3:01 PM 09/04/2017    2:45 PM  Advanced Directives  Does Patient Have a Medical Advance Directive? No No No Yes No No No  Type of Advance Directive    [place] in Chart?    No - copy requested     Would patient like information on creating a medical advance directive? No - Patient declined No - Patient declined No - Patient declined  No - Patient declined      Current Medications (verified) Outpatient Encounter Medications as of 04/01/2022  Medication Sig   amLODipine  (NORVASC) 10 MG tablet Take 1 tablet (10 mg total) by mouth at bedtime.   chlorthalidone (HYGROTON) 25 MG tablet Take 0.5 tablets (12.5 mg total) by mouth daily.   Evolocumab (REPATHA SURECLICK) 749 MG/ML SOAJ Repatha SureClick 449 mg/mL subcutaneous pen injector  INJECT 1 PEN INTO THE SKIN EVERY 14 (FOURTEEN) DAYS.   lisinopril (ZESTRIL) 40 MG tablet TAKE 1.5 TABLETS (60 MG TOTAL) BY MOUTH EVERY MORNING.   XARELTO 20 MG TABS tablet TAKE 1 TABLET(20 MG) BY MOUTH DAILY WITH SUPPER   No facility-administered encounter medications on file as of 04/01/2022.    Allergies (verified) Patient has no known allergies.   History: Past Medical History:  Diagnosis Date   Bilateral leg pain 07/02/2016   Cancer ([place]) 08/03/2010   colon   DVT (deep venous thrombosis) (HCC)    recurrent; on long term anticoag (Rivaroxaban)   History of colon cancer, stage I 08/17/2015   HLD (hyperlipidemia)    Hypertension    Prediabetes 05/25/2019   Uterine fibroid    Varicose 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 08/26/2014   Past Surgical History:  Procedure Laterality Date   ABDOMINAL HYSTERECTOMY     CHOLECYSTECTOMY     COLON SURGERY     GALLBLADDER SURGERY     MEDIASTERNOTOMY N/A 11/24/2017   Procedure: PARTIAL STERNOTOMY;  Surgeon: [name], MD;  Location: [facility];  Service: Thoracic;  Laterality: N/A;   THYMECTOMY  11/24/2017   Procedure: THYMECTOMY;  Surgeon: [name], MD;  Location: [facility] OR;  Service: Thoracic;;   Family History  Problem Relation Age of Onset   Heart attack Mother    Hypertension Father    Social History   Socioeconomic History   Marital status: Married    Spouse name: Not on file   Number of children: 1   Years of education: Bachelors   Highest education level: Not on file  Occupational History   Not on file  Tobacco Use   Smoking status: Never   Smokeless tobacco: Never  Vaping Use   Vaping Use: Never used  Substance and Sexual Activity    Alcohol use: No    Alcohol/week: 0.0 standard drinks of alcohol   Drug use: No   Sexual activity: Not on file  Other Topics Concern   Not on file  Social History Narrative   Lives at home with husband and son.   Right-handed.   No caffeine use.   Social Determinants of Health   Financial Resource Strain: Not on file  Food Insecurity: Not on file  Transportation Needs: Not on file  Physical Activity: Not on file  Stress: Not on file  Social Connections: Not on file    Tobacco Counseling Counseling given: Not Answered   Clinical Intake:                 Diabetic?No         Activities of Daily Living    08/27/2021    3:44 PM  In your present state of health, do you have any difficulty performing the following activities:  Hearing? 0  Vision? 0  Difficulty concentrating or making decisions? 0  Walking or climbing stairs? 0  Dressing or bathing? 0  Doing errands, shopping? 0    Patient Care Team: Copland, Gay Filler, MD as PCP - General (Family Medicine) Stanford Breed Denice Bors, MD as PCP - Cardiology (Cardiology) Dene Gentry, MD as Consulting Physician (Sports Medicine)  Indicate any recent Medical Services you may have received from other than [facility] providers in the past year (date may be approximate).     Assessment:   This is a routine wellness examination for [name].  Hearing/Vision screen No results found.  Dietary issues and exercise activities discussed:     Goals Addressed   None    Depression Screen    10/30/2020   10:25 AM 07/18/2016    4:26 PM 06/03/2016    4:52 PM 09/23/2015    1:46 PM  PHQ 2/9 Scores  PHQ - 2 Score 0 0 0 0    Fall Risk    10/30/2020   10:21 AM 07/18/2016    4:26 PM 06/03/2016    4:52 PM  [place] in the past year? 0 No No  Number falls in past yr: 0    Injury with Fall? 0      FALL RISK PREVENTION PERTAINING TO THE HOME:  Any stairs in or around the home? {YES/NO:21197} If so, are there any  without handrails? {YES/NO:21197} Home free of loose throw rugs in walkways, pet beds, electrical cords, etc? {YES/NO:21197} Adequate lighting in your home to reduce risk of falls? {YES/NO:21197}  ASSISTIVE DEVICES UTILIZED TO PREVENT FALLS:  Life alert? {YES/NO:21197} Use of a cane, walker or w/c? {YES/NO:21197} Grab bars in the bathroom? {YES/NO:21197} Shower chair or bench in shower? {YES/NO:21197} Elevated toilet seat or a handicapped  toilet? {YES/NO:21197}  TIMED UP AND GO:  Was the test performed? No . Phone visit   Cognitive Function:        Immunizations Immunization History  Administered Date(s) Administered   Tdap 12/18/2016    TDAP status: Up to date  {Flu Vaccine status:2101806}  Pneumococcal vaccine status: Due, Education has been provided regarding the importance of this vaccine. Advised may receive this vaccine at local pharmacy or Health Dept. Aware to provide a copy of the vaccination record if obtained from local pharmacy or Health Dept. Verbalized acceptance and understanding.  Covid-19 vaccine status: Information provided on how to obtain vaccines.   Qualifies for Shingles Vaccine? Yes   Zostavax completed No   Shingrix Completed?: No.    Education has been provided regarding the importance of this vaccine. Patient has been advised to call insurance company to determine out of pocket expense if they have not yet received this vaccine. Advised may also receive vaccine at local pharmacy or Health Dept. Verbalized acceptance and understanding.  Screening Tests Health Maintenance  Topic Date Due   COVID-19 Vaccine (1) Never done   Zoster Vaccines- Shingrix (1 of 2) Never done   Pneumonia Vaccine 55+ Years old (1 - PCV) Never done   INFLUENZA VACCINE  03/12/2022   MAMMOGRAM  03/18/2024   COLONOSCOPY (Pts 45-82yrs Insurance coverage will need to be confirmed)  05/11/2025   TETANUS/TDAP  12/19/2026   DEXA SCAN  Completed   Hepatitis C Screening   Completed   HPV VACCINES  Aged Out    Health Maintenance  Health Maintenance Due  Topic Date Due   COVID-19 Vaccine (1) Never done   Zoster Vaccines- Shingrix (1 of 2) Never done   Pneumonia Vaccine 23+ Years old (1 - PCV) Never done   INFLUENZA VACCINE  03/12/2022    Colorectal cancer screening: Type of screening: Colonoscopy. Completed 05/11/2020. Repeat every 5 years  Mammogram status: Completed bilateral 03/18/2022. Repeat every year  {Bone Density status:21018021}  Lung Cancer Screening: (Low Dose CT Chest recommended if Age 27-80 years, 30 pack-year currently smoking OR have quit w/in 15years.) does not qualify.     Additional Screening:  Hepatitis C Screening: Completed 04/07/2020  Vision Screening: Recommended annual ophthalmology exams for early detection of glaucoma and other disorders of the eye. Is the patient up to date with their annual eye exam?  {YES/NO:21197} Who is the provider or what is the name of the office in which the patient attends annual eye exams? *** If pt is not established with a provider, would they like to be referred to a provider to establish care? {YES/NO:21197}.   Dental Screening: Recommended annual dental exams for proper oral hygiene  Community Resource Referral / Chronic Care Management: CRR required this visit?  {YES/NO:21197}  CCM required this visit?  {YES/NO:21197}     Plan:     I have personally reviewed and noted the following in the patient's chart:   Medical and social history Use of alcohol, tobacco or illicit drugs  Current medications and supplements including opioid prescriptions. {Opioid Prescriptions:[phone]} Functional ability and status Nutritional status Physical activity Advanced directives List of other physicians Hospitalizations, surgeries, and ER visits in previous 12 months Vitals Screenings to include cognitive, depression, and falls Referrals and appointments  In addition, I have reviewed and  discussed with patient certain preventive protocols, quality metrics, and best practice recommendations. A written personalized care plan for preventive services as well as general preventive health recommendations were provided to  patient.   Due to this being a telephonic visit, the after visit summary with patients personalized plan was offered to patient via mail or my-chart.  Patient would like to access on my-chart.  Marta Antu, LPN   0/30/1314  Nurse Health Advisor  Nurse Notes: ***

## 2022-04-03 ENCOUNTER — Ambulatory Visit (INDEPENDENT_AMBULATORY_CARE_PROVIDER_SITE_OTHER): Payer: 59

## 2022-04-03 VITALS — BP 146/80 | HR 48 | Temp 98.2°F | Resp 16 | Ht 66.0 in | Wt 190.6 lb

## 2022-04-03 DIAGNOSIS — Z0000 Encounter for general adult medical examination without abnormal findings: Secondary | ICD-10-CM

## 2022-04-03 NOTE — Patient Instructions (Signed)
[name] , Thank you for taking time to come for your Medicare Wellness Visit. I appreciate your ongoing commitment to your health goals. Please review the following plan we discussed and let me know if I can assist you in the future.   Screening recommendations/referrals: Colonoscopy: 05/11/20 due 05/11/25 Mammogram: 03/18/22 due 03/19/23 Bone Density: declined Recommended yearly ophthalmology/optometry visit for glaucoma screening and checkup Recommended yearly dental visit for hygiene and checkup  Vaccinations: Influenza vaccine: declined Pneumococcal vaccine: declined Tdap vaccine: up to date Shingles vaccine: declined   Covid-19:declined  Advanced directives: no  Conditions/risks identified: see problem list   Next appointment: Follow up in one year for your annual wellness visit    Preventive Care 57 Years and Older, Female Preventive care refers to lifestyle choices and visits with your health care provider that can promote health and wellness. What does preventive care include? A yearly physical exam. This is also called an annual well check. Dental exams once or twice a year. Routine eye exams. Ask your health care provider how often you should have your eyes checked. Personal lifestyle choices, including: Daily care of your teeth and gums. Regular physical activity. Eating a healthy diet. Avoiding tobacco and drug use. Limiting alcohol use. Practicing safe sex. Taking low-dose aspirin every day. Taking vitamin and mineral supplements as recommended by your health care provider. What happens during an annual well check? The services and screenings done by your health care provider during your annual well check will depend on your age, overall health, lifestyle risk factors, and family history of disease. Counseling  Your health care provider may ask you questions about your: Alcohol use. Tobacco use. Drug use. Emotional well-being. Home and relationship  well-being. Sexual activity. Eating habits. History of falls. Memory and ability to understand (cognition). Work and work Statistician. Reproductive health. Screening  You may have the following tests or measurements: Height, weight, and BMI. Blood pressure. Lipid and cholesterol levels. These may be checked every 5 years, or more frequently if you are over 46 years old. Skin check. Lung cancer screening. You may have this screening every year starting at age 60 if you have a 30-pack-year history of smoking and currently smoke or have quit within the past 15 years. Fecal occult blood test (FOBT) of the stool. You may have this test every year starting at age 62. Flexible sigmoidoscopy or colonoscopy. You may have a sigmoidoscopy every 5 years or a colonoscopy every 10 years starting at age 79. Hepatitis C blood test. Hepatitis B blood test. Sexually transmitted disease (STD) testing. Diabetes screening. This is done by checking your blood sugar (glucose) after you have not eaten for a while (fasting). You may have this done every 1-3 years. Bone density scan. This is done to screen for osteoporosis. You may have this done starting at age 14. Mammogram. This may be done every 1-2 years. Talk to your health care provider about how often you should have regular mammograms. Talk with your health care provider about your test results, treatment options, and if necessary, the need for more tests. Vaccines  Your health care provider may recommend certain vaccines, such as: Influenza vaccine. This is recommended every year. Tetanus, diphtheria, and acellular pertussis (Tdap, Td) vaccine. You may need a Td booster every 10 years. Zoster vaccine. You may need this after age 84. Pneumococcal 13-valent conjugate (PCV13) vaccine. One dose is recommended after age 98. Pneumococcal polysaccharide (PPSV23) vaccine. One dose is recommended after age 45. Talk to your  health care provider about which  screenings and vaccines you need and how often you need them. This information is not intended to replace advice given to you by your health care provider. Make sure you discuss any questions you have with your health care provider. Document Released: 08/25/2015 Document Revised: 04/17/2016 Document Reviewed: 05/30/2015 Elsevier Interactive Patient Education  2017 [place] Prevention in the Home Falls can cause injuries. They can happen to people of all ages. There are many things you can do to make your home safe and to help prevent falls. What can I do on the outside of my home? Regularly fix the edges of walkways and driveways and fix any cracks. Remove anything that might make you trip as you walk through a door, such as a raised step or threshold. Trim any bushes or trees on the path to your home. Use bright outdoor lighting. Clear any walking paths of anything that might make someone trip, such as rocks or tools. Regularly check to see if handrails are loose or broken. Make sure that both sides of any steps have handrails. Any raised decks and porches should have guardrails on the edges. Have any leaves, snow, or ice cleared regularly. Use sand or salt on walking paths during winter. Clean up any spills in your garage right away. This includes oil or grease spills. What can I do in the bathroom? Use night lights. Install grab bars by the toilet and in the tub and shower. Do not use towel bars as grab bars. Use non-skid mats or decals in the tub or shower. If you need to sit down in the shower, use a plastic, non-slip stool. Keep the floor dry. Clean up any water that spills on the floor as soon as it happens. Remove soap buildup in the tub or shower regularly. Attach bath mats securely with double-sided non-slip rug tape. Do not have throw rugs and other things on the floor that can make you trip. What can I do in the bedroom? Use night lights. Make sure that you have a  light by your bed that is easy to reach. Do not use any sheets or blankets that are too big for your bed. They should not hang down onto the floor. Have a firm chair that has side arms. You can use this for support while you get dressed. Do not have throw rugs and other things on the floor that can make you trip. What can I do in the kitchen? Clean up any spills right away. Avoid walking on wet floors. Keep items that you use a lot in easy-to-reach places. If you need to reach something above you, use a strong step stool that has a grab bar. Keep electrical cords out of the way. Do not use floor polish or wax that makes floors slippery. If you must use wax, use non-skid floor wax. Do not have throw rugs and other things on the floor that can make you trip. What can I do with my stairs? Do not leave any items on the stairs. Make sure that there are handrails on both sides of the stairs and use them. Fix handrails that are broken or loose. Make sure that handrails are as long as the stairways. Check any carpeting to make sure that it is firmly attached to the stairs. Fix any carpet that is loose or worn. Avoid having throw rugs at the top or bottom of the stairs. If you do have throw rugs, attach them  to the floor with carpet tape. Make sure that you have a light switch at the top of the stairs and the bottom of the stairs. If you do not have them, ask someone to add them for you. What else can I do to help prevent falls? Wear shoes that: Do not have high heels. Have rubber bottoms. Are comfortable and fit you well. Are closed at the toe. Do not wear sandals. If you use a stepladder: Make sure that it is fully opened. Do not climb a closed stepladder. Make sure that both sides of the stepladder are locked into place. Ask someone to hold it for you, if possible. Clearly mark and make sure that you can see: Any grab bars or handrails. First and last steps. Where the edge of each step  is. Use tools that help you move around (mobility aids) if they are needed. These include: Canes. Walkers. Scooters. Crutches. Turn on the lights when you go into a dark area. Replace any light bulbs as soon as they burn out. Set up your furniture so you have a clear path. Avoid moving your furniture around. If any of your floors are uneven, fix them. If there are any pets around you, be aware of where they are. Review your medicines with your doctor. Some medicines can make you feel dizzy. This can increase your chance of falling. Ask your doctor what other things that you can do to help prevent falls. This information is not intended to replace advice given to you by your health care provider. Make sure you discuss any questions you have with your health care provider. Document Released: 05/25/2009 Document Revised: 01/04/2016 Document Reviewed: 09/02/2014 Elsevier Interactive Patient Education  2017 [employer].

## 2022-04-03 NOTE — Progress Notes (Addendum)
Subjective:   [name] is a 66 y.o. female who presents for an Initial Medicare Annual Wellness Visit.  Review of Systems     Cardiac Risk Factors include: advanced age (>73men, >29 women);hypertension;dyslipidemia;obesity (BMI >30kg/m2)     Objective:    Today's Vitals   04/03/22 1506  BP: (!) 169/76  Pulse: (!) 48  Resp: 16  Temp: 98.2 F (36.8 C)  SpO2: 98%  Weight: 190 lb 9.6 oz (86.5 kg)  Height: 5\' 6"  (1.676 m)   Body mass index is 30.76 kg/m.     04/03/2022    3:04 PM 04/04/2020    9:28 PM 03/21/2020   12:27 AM 03/04/2020   11:13 AM 12/25/2017    3:41 PM 11/24/2017    5:50 AM 11/20/2017    3:01 PM  Advanced Directives  Does Patient Have a Medical Advance Directive? No No No No Yes No No  Type of Advance Directive     [place] in Chart?     No - copy requested    Would patient like information on creating a medical advance directive? No - Patient declined No - Patient declined No - Patient declined No - Patient declined  No - Patient declined     Current Medications (verified) Outpatient Encounter Medications as of 04/03/2022  Medication Sig   amLODipine (NORVASC) 10 MG tablet Take 1 tablet (10 mg total) by mouth at bedtime.   chlorthalidone (HYGROTON) 25 MG tablet Take 0.5 tablets (12.5 mg total) by mouth daily.   Evolocumab (REPATHA SURECLICK) 580 MG/ML SOAJ Repatha SureClick 998 mg/mL subcutaneous pen injector  INJECT 1 PEN INTO THE SKIN EVERY 14 (FOURTEEN) DAYS.   lisinopril (ZESTRIL) 40 MG tablet TAKE 1.5 TABLETS (60 MG TOTAL) BY MOUTH EVERY MORNING.   XARELTO 20 MG TABS tablet TAKE 1 TABLET(20 MG) BY MOUTH DAILY WITH SUPPER   No facility-administered encounter medications on file as of 04/03/2022.    Allergies (verified) Patient has no known allergies.   History: Past Medical History:  Diagnosis Date   Bilateral leg pain 07/02/2016   Cancer ([place]) 08/03/2010   colon   DVT (deep  venous thrombosis) ([facility])    recurrent; on long term anticoag (Rivaroxaban)   History of colon cancer, stage I 08/17/2015   HLD (hyperlipidemia)    Hypertension    Prediabetes 05/25/2019   Uterine fibroid    Varicose 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 08/26/2014   Past Surgical History:  Procedure Laterality Date   ABDOMINAL HYSTERECTOMY     CHOLECYSTECTOMY     COLON SURGERY     GALLBLADDER SURGERY     MEDIASTERNOTOMY N/A 11/24/2017   Procedure: PARTIAL STERNOTOMY;  Surgeon: [name], MD;  Location: [facility];  Service: Thoracic;  Laterality: N/A;   THYMECTOMY  11/24/2017   Procedure: THYMECTOMY;  Surgeon: [name], MD;  Location: [facility] OR;  Service: Thoracic;;   Family History  Problem Relation Age of Onset   Heart attack Mother    Hypertension Father    Social History   Socioeconomic History   Marital status: Married    Spouse name: Not on file   Number of children: 1   Years of education: Bachelors   Highest education level: Not on file  Occupational History   Not on file  Tobacco Use   Smoking status: Never   Smokeless tobacco: Never  Vaping Use   Vaping Use:  Never used  Substance and Sexual Activity   Alcohol use: No    Alcohol/week: 0.0 standard drinks of alcohol   Drug use: No   Sexual activity: Not on file  Other Topics Concern   Not on file  Social History Narrative   Lives at home with husband and son.   Right-handed.   No caffeine use.   Social Determinants of Health   Financial Resource Strain: Not on file  Food Insecurity: Not on file  Transportation Needs: Not on file  Physical Activity: Not on file  Stress: Not on file  Social Connections: Not on file    Tobacco Counseling Counseling given: Not Answered   Clinical Intake:  Pre-visit preparation completed: Yes  Pain : No/denies pain     BMI - recorded: 30.76 Nutritional Status: BMI > 30  Obese Nutritional Risks: None Diabetes: No  How often do you need to  have someone help you when you read instructions, pamphlets, or other written materials from your doctor or pharmacy?: 1 - Never  Diabetic?no  Interpreter Needed?: No  Information entered by :: Les Longmore   Activities of Daily Living    04/03/2022    3:09 PM 08/27/2021    3:44 PM  In your present state of health, do you have any difficulty performing the following activities:  Hearing? 0 0  Vision? 0 0  Difficulty concentrating or making decisions? 0 0  Walking or climbing stairs? 0 0  Dressing or bathing? 0 0  Doing errands, shopping? 0 0  Preparing Food and eating ? N   Using the Toilet? N   In the past six months, have you accidently leaked urine? N   Do you have problems with loss of bowel control? N   Managing your Medications? N   Managing your Finances? N   Housekeeping or managing your Housekeeping? N     Patient Care Team: Copland, Gay Filler, MD as PCP - General (Family Medicine) Stanford Breed Denice Bors, MD as PCP - Cardiology (Cardiology) Dene Gentry, MD as Consulting Physician (Sports Medicine)  Indicate any recent Medical Services you may have received from other than [facility] providers in the past year (date may be approximate).     Assessment:   This is a routine wellness examination for [name].  Hearing/Vision screen No results found.  Dietary issues and exercise activities discussed: Current Exercise Habits: Home exercise routine, Type of exercise: walking;stretching, Time (Minutes): 30, Frequency (Times/Week): 4, Weekly Exercise (Minutes/Week): 120, Intensity: Mild, Exercise limited by: None identified   Goals Addressed   None    Depression Screen    04/03/2022    3:05 PM 10/30/2020   10:25 AM 07/18/2016    4:26 PM 06/03/2016    4:52 PM 09/23/2015    1:46 PM  PHQ 2/9 Scores  PHQ - 2 Score 0 0 0 0 0    Fall Risk    04/03/2022    3:04 PM 10/30/2020   10:21 AM 07/18/2016    4:26 PM 06/03/2016    4:52 PM  [place] in the past year? 0 0  No No  Number falls in past yr: 0 0    Injury with Fall? 0 0    Risk for fall due to : No Fall Risks     Follow up Falls evaluation completed       [place]:  Any stairs in or around the home? Yes  If so,  are there any without handrails? No  Home free of loose throw rugs in walkways, pet beds, electrical cords, etc? Yes  Adequate lighting in your home to reduce risk of falls? Yes   ASSISTIVE DEVICES UTILIZED TO PREVENT FALLS:  Life alert? No  Use of a cane, walker or w/c? No  Grab bars in the bathroom? No  Shower chair or bench in shower? Yes  Elevated toilet seat or a handicapped toilet? Yes   TIMED UP AND GO:  Was the test performed? Yes .  Length of time to ambulate 10 feet: 10 sec.   Gait steady and fast without use of assistive device  Cognitive Function:        04/03/2022    3:13 PM  6CIT Screen  What Year? 0 points  What month? 0 points  What time? 0 points  Count back from 20 0 points  Months in reverse 0 points  Repeat phrase 0 points  Total Score 0 points    Immunizations Immunization History  Administered Date(s) Administered   Tdap 12/18/2016    TDAP status: Up to date  Flu Vaccine status: Declined, Education has been provided regarding the importance of this vaccine but patient still declined. Advised may receive this vaccine at local pharmacy or Health Dept. Aware to provide a copy of the vaccination record if obtained from local pharmacy or Health Dept. Verbalized acceptance and understanding.  Pneumococcal vaccine status: Declined,  Education has been provided regarding the importance of this vaccine but patient still declined. Advised may receive this vaccine at local pharmacy or Health Dept. Aware to provide a copy of the vaccination record if obtained from local pharmacy or Health Dept. Verbalized acceptance and understanding.   Covid-19 vaccine status: Declined, Education has been provided regarding the  importance of this vaccine but patient still declined. Advised may receive this vaccine at local pharmacy or Health Dept.or vaccine clinic. Aware to provide a copy of the vaccination record if obtained from local pharmacy or Health Dept. Verbalized acceptance and understanding.  Qualifies for Shingles Vaccine? Yes   Zostavax completed No   Shingrix Completed?: No.    Education has been provided regarding the importance of this vaccine. Patient has been advised to call insurance company to determine out of pocket expense if they have not yet received this vaccine. Advised may also receive vaccine at local pharmacy or Health Dept. Verbalized acceptance and understanding.  Screening Tests Health Maintenance  Topic Date Due   COVID-19 Vaccine (1) Never done   Zoster Vaccines- Shingrix (1 of 2) Never done   Pneumonia Vaccine 57+ Years old (1 - PCV) Never done   INFLUENZA VACCINE  03/12/2022   MAMMOGRAM  03/18/2024   COLONOSCOPY (Pts 45-54yrs Insurance coverage will need to be confirmed)  05/11/2025   TETANUS/TDAP  12/19/2026   DEXA SCAN  Completed   Hepatitis C Screening  Completed   HPV VACCINES  Aged Out    Health Maintenance  Health Maintenance Due  Topic Date Due   COVID-19 Vaccine (1) Never done   Zoster Vaccines- Shingrix (1 of 2) Never done   Pneumonia Vaccine 53+ Years old (1 - PCV) Never done   INFLUENZA VACCINE  03/12/2022    Colorectal cancer screening: Type of screening: Colonoscopy. Completed 05/11/20. Repeat every 5 years  Mammogram status: Completed 03/18/22. Repeat every year  Bone Density status: Ordered declined. Pt provided with contact info and advised to call to schedule appt.  Lung Cancer Screening: (Low Dose CT  Chest recommended if Age 46-80 years, 30 pack-year currently smoking OR have quit w/in 15years.) does not qualify.   Lung Cancer Screening Referral: n/a  Additional Screening:  Hepatitis C Screening: does qualify; Completed 04/07/20  Vision Screening:  Recommended annual ophthalmology exams for early detection of glaucoma and other disorders of the eye. Is the patient up to date with their annual eye exam?  No  Who is the provider or what is the name of the office in which the patient attends annual eye exams? N/a If pt is not established with a provider, would they like to be referred to a provider to establish care? No .   Dental Screening: Recommended annual dental exams for proper oral hygiene  Community Resource Referral / Chronic Care Management: CRR required this visit?  No   CCM required this visit?  No      Plan:     I have personally reviewed and noted the following in the patient's chart:   Medical and social history Use of alcohol, tobacco or illicit drugs  Current medications and supplements including opioid prescriptions. Patient is not currently taking opioid prescriptions. Functional ability and status Nutritional status Physical activity Advanced directives List of other physicians Hospitalizations, surgeries, and ER visits in previous 12 months Vitals Screenings to include cognitive, depression, and falls Referrals and appointments  In addition, I have reviewed and discussed with patient certain preventive protocols, quality metrics, and best practice recommendations. A written personalized care plan for preventive services as well as general preventive health recommendations were provided to patient.     Duard Brady Zamariya Neal, [place]   04/03/2022   Nurse Notes: none

## 2022-04-14 NOTE — Progress Notes (Addendum)
[facility] [employer] [place], [place], [place] [address] [phone]  Date:  09/20/5282   Name:  [name]   DOB:  Jun 03, 1956   MRN:  [id number]  PCP:  Darreld Mclean, MD    Chief Complaint: Medical Management of Chronic Issues (6 m f/u )   History of Present Illness:  [name] is a 66 y.o. very pleasant female patient who presents with the following:  Pt seen today for follow-up- She has history of colon cancer, recurrent DVT on xarelto, hypertension, prediabetes, stage I thymoma status post thymectomy 2019 Last visit with myself in January: Patient seen today for a couple of recurrent concerns.  She has noted labile blood pressure with higher readings in the evening which are quite concerning to her.  I will order 24-hour blood pressure monitor for further evaluation Also, she has concern of chronic chest pain.  Her cardiologist has moved away.  I will check a troponin today for completeness, referral made so she can establish with a new cardiologist She will let me know if any worsening in the meantime Finally, she notes epigastric pain.  Her gastroenterologist is Dr. Adine Madura thought she was due for colonoscopy but she must of had one in the interim that I am not aware of.  I will track down these records.  Otherwise in the meantime we will do an H. pylori breath test, CMP, and use omeprazole for 2 to 4 weeks   She has been seen by orthopedics, Dr. Caralyn Guile for an ulnar nerve entrapment and Dr. Alma Friendly for her shoulder She was also seen by her new cardiologist, Dr. Stanford Breed in January for persistently intermittent chest pain.  He did a CT coronary, score of 0  IMPRESSION: 1. Coronary calcium score of 0. This was 0 percentile for age-, sex, and race-matched controls A/P 1 chest pain-symptoms atypical and no diagnostic ECG changes.  We will plan cardiac CTA to exclude obstructive coronary disease. 2  hypertension-patient states she is taking lisinopril 40 mg twice daily.  I will decrease this to 40 mg daily.  Increase amlodipine to 10 mg daily.  Follow blood pressure and advance medications as needed. 3 history of recurrent DVT-continue Xarelto. 4 hyperlipidemia-continue Repatha.  She is intolerant to statins.  Flu vaccine- declines all vaccines today  Shingles vaccine-recommended Pneumonia vaccine-recommended Recommend COVID  She notes when "my BP is high I will be in the bathroom (to urinate) every 10- 15 minutes"- this occurs off and on, seems to mostly be an issue at night.  She has previously noted labile blood pressure.  I ordered a 24-hour blood pressure monitor, but I do not believe this was ever done Patient Active Problem List   Diagnosis Date Noted   Stroke-like symptoms 03/21/2020   HLD (hyperlipidemia)    COVID-19 virus infection    Elevated liver enzymes    Ischemic stroke ([place])    Statin myopathy 12/29/2019   Prediabetes 05/25/2019   S/P thymectomy 11/24/2017   Bilateral leg pain 07/02/2016   History of colon cancer, stage I 08/17/2015   Varicose 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 08/26/2014   HTN (hypertension) 12/24/2013   Cancer of left colon ([place]) 02/25/2012   DVT (deep venous thrombosis) ([place]) 08/27/2011    Past Medical History:  Diagnosis Date   Bilateral leg pain 07/02/2016   Cancer ([place]) 08/03/2010   colon   DVT (deep venous thrombosis) ([facility])  recurrent; on long term anticoag (Rivaroxaban)   History of colon cancer, stage I 08/17/2015   HLD (hyperlipidemia)    Hypertension    Prediabetes 05/25/2019   Uterine fibroid    Varicose 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 08/26/2014    Past Surgical History:  Procedure Laterality Date   ABDOMINAL HYSTERECTOMY     CHOLECYSTECTOMY     COLON SURGERY     GALLBLADDER SURGERY     MEDIASTERNOTOMY N/A 11/24/2017   Procedure: PARTIAL STERNOTOMY;  Surgeon: [name], MD;  Location: [facility];  Service: Thoracic;  Laterality: N/A;   THYMECTOMY  11/24/2017   Procedure: THYMECTOMY;  Surgeon: [name], MD;  Location: [facility] OR;  Service: Thoracic;;    Social History   Tobacco Use   Smoking status: Never   Smokeless tobacco: Never  Vaping Use   Vaping Use: Never used  Substance Use Topics   Alcohol use: No    Alcohol/week: 0.0 standard drinks of alcohol   Drug use: No    Family History  Problem Relation Age of Onset   Heart attack Mother    Hypertension Father     No Known Allergies  Medication list has been reviewed and updated.  Current Outpatient Medications on File Prior to Visit  Medication Sig Dispense Refill   amLODipine (NORVASC) 10 MG tablet Take 1 tablet (10 mg total) by mouth at bedtime. 90 tablet 3   Evolocumab (REPATHA SURECLICK) 253 MG/ML SOAJ Repatha SureClick 664 mg/mL subcutaneous pen injector  INJECT 1 PEN INTO THE SKIN EVERY 14 (FOURTEEN) DAYS. 2 mL 11   lisinopril (ZESTRIL) 40 MG tablet TAKE 1.5 TABLETS (60 MG TOTAL) BY MOUTH EVERY MORNING. 135 tablet 4   XARELTO 20 MG TABS tablet TAKE 1 TABLET(20 MG) BY MOUTH DAILY WITH SUPPER 90 tablet 3   chlorthalidone (HYGROTON) 25 MG tablet Take 0.5 tablets (12.5 mg total) by mouth daily. (Patient not taking: Reported on 04/17/2022) 45 tablet 3   No current facility-administered medications on file prior to visit.    Review of Systems:  As per HPI- otherwise negative.   Physical Examination: Vitals:   04/17/22 1436  BP: 138/70  Pulse: (!) 58  Temp: 98 F (36.7 C)  SpO2: 98%   Vitals:   04/17/22 1436  Weight: 192 lb (87.1 kg)  Height: 5\' 6"  (1.676 m)   Body mass index is 30.99 kg/m. Ideal Body Weight: Weight in (lb) to have BMI = 25: 154.6  GEN: no acute distress.  Obese, looks well HEENT: Atraumatic, Normocephalic.  Ears and Nose: No external deformity. CV: RRR, No M/G/R. No JVD. No thrill. No extra heart sounds. PULM: CTA B, no wheezes, crackles, rhonchi. No retractions. No  resp. distress. No accessory muscle use. ABD: S, NT, ND, +BS. No rebound. No HSM. EXTR: No c/c/e PSYCH: Normally interactive. Conversant.    Assessment and Plan: Labile blood pressure - Plan: TSH, 24 hour blood pressure monitor  Chronic chest pain  Prediabetes - Plan: Hemoglobin A1c  Essential hypertension - Plan: CBC, Comprehensive metabolic panel  Dyslipidemia - Plan: Lipid panel  Patient seen today for follow-up.  She notes concern of elevated blood pressure at night.  Her blood pressure tends to be reasonable when she is seen in the office.  I will order a 24-hour blood pressure monitor to help [place] elucidate what is going on with her blood pressure fluctuation She notes chronic chest pain, has been seen by cardiology and had a coronary  calcium score of 0.  She did call gastroenterology but I do not think they understood what she needed, advised her that she is not yet due for colonoscopy.  I will give Dr. Ulyses Amor office a call and ask if they can see her.  Possible GI cause of her chronic chest pain  Routine labs are pending as above  Signed Lamar Blinks, MD Received her labs as below, message to patient Results for orders placed or performed in visit on 04/17/22  CBC  Result Value Ref Range   WBC 4.5 4.0 - 10.5 K/uL   RBC 4.33 3.87 - 5.11 Mil/uL   Platelets 156.0 150.0 - 400.0 K/uL   Hemoglobin 13.5 12.0 - 15.0 g/dL   HCT 40.3 36.0 - 46.0 %   MCV 93.1 78.0 - 100.0 fl   MCHC 33.5 30.0 - 36.0 g/dL   RDW 12.9 11.5 - 15.5 %  Comprehensive metabolic panel  Result Value Ref Range   Sodium 142 135 - 145 mEq/L   Potassium 4.6 3.5 - 5.1 mEq/L   Chloride 104 96 - 112 mEq/L   CO2 28 19 - 32 mEq/L   Glucose, Bld 92 70 - 99 mg/dL   BUN 19 6 - 23 mg/dL   Creatinine, Ser 1.10 0.40 - 1.20 mg/dL   Total Bilirubin 0.5 0.2 - 1.2 mg/dL   Alkaline Phosphatase 84 39 - 117 U/L   AST 22 0 - 37 U/L   ALT 22 0 - 35 U/L   Total Protein 6.6 6.0 - 8.3 g/dL   Albumin 4.4 3.5 - 5.2 g/dL    GFR 51.65 (L) >60.00 mL/min   Calcium 9.5 8.4 - 10.5 mg/dL  Hemoglobin A1c  Result Value Ref Range   Hgb A1c MFr Bld 6.2 4.6 - 6.5 %  Lipid panel  Result Value Ref Range   Cholesterol 173 0 - 200 mg/dL   Triglycerides 171.0 (H) 0.0 - 149.0 mg/dL   HDL 57.70 >39.00 mg/dL   VLDL 34.2 0.0 - 40.0 mg/dL   LDL Cholesterol 81 0 - 99 mg/dL   Total CHOL/HDL Ratio 3    NonHDL 115.63   TSH  Result Value Ref Range   TSH 1.88 0.35 - 5.50 uIU/mL

## 2022-04-16 NOTE — Patient Instructions (Incomplete)
It was good to see you again today! I will be in touch with your labs I will ask your stomach doctor- Dr Benson Norway- to get you in for a visit in case he might be able to explain your chest pain We will also set up a 24 hour BP monitor for you

## 2022-04-17 ENCOUNTER — Ambulatory Visit (INDEPENDENT_AMBULATORY_CARE_PROVIDER_SITE_OTHER): Payer: 59 | Admitting: Family Medicine

## 2022-04-17 ENCOUNTER — Encounter: Payer: Self-pay | Admitting: Family Medicine

## 2022-04-17 VITALS — BP 138/70 | HR 58 | Temp 98.0°F | Ht 66.0 in | Wt 192.0 lb

## 2022-04-17 DIAGNOSIS — I10 Essential (primary) hypertension: Secondary | ICD-10-CM

## 2022-04-17 DIAGNOSIS — R7303 Prediabetes: Secondary | ICD-10-CM

## 2022-04-17 DIAGNOSIS — E785 Hyperlipidemia, unspecified: Secondary | ICD-10-CM

## 2022-04-17 DIAGNOSIS — G8929 Other chronic pain: Secondary | ICD-10-CM

## 2022-04-17 DIAGNOSIS — R0989 Other specified symptoms and signs involving the circulatory and respiratory systems: Secondary | ICD-10-CM | POA: Diagnosis not present

## 2022-04-17 DIAGNOSIS — R079 Chest pain, unspecified: Secondary | ICD-10-CM

## 2022-04-18 ENCOUNTER — Encounter: Payer: Self-pay | Admitting: Family Medicine

## 2022-04-18 LAB — COMPREHENSIVE METABOLIC PANEL
ALT: 22 U/L (ref 0–35)
AST: 22 U/L (ref 0–37)
Albumin: 4.4 g/dL (ref 3.5–5.2)
Alkaline Phosphatase: 84 U/L (ref 39–117)
BUN: 19 mg/dL (ref 6–23)
CO2: 28 mEq/L (ref 19–32)
Calcium: 9.5 mg/dL (ref 8.4–10.5)
Chloride: 104 mEq/L (ref 96–112)
Creatinine, Ser: 1.1 mg/dL (ref 0.40–1.20)
GFR: 51.65 mL/min — ABNORMAL LOW (ref >60.00)
Glucose, Bld: 92 mg/dL (ref 70–99)
Potassium: 4.6 mEq/L (ref 3.5–5.1)
Sodium: 142 mEq/L (ref 135–145)
Total Bilirubin: 0.5 mg/dL (ref 0.2–1.2)
Total Protein: 6.6 g/dL (ref 6.0–8.3)

## 2022-04-18 LAB — TSH: TSH: 1.88 u[IU]/mL (ref 0.35–5.50)

## 2022-04-18 LAB — LIPID PANEL
Cholesterol: 173 mg/dL (ref 0–200)
HDL: 57.7 mg/dL (ref >39.00)
LDL Cholesterol: 81 mg/dL (ref 0–99)
NonHDL: 115.63
Total CHOL/HDL Ratio: 3
Triglycerides: 171 mg/dL — ABNORMAL HIGH (ref 0.0–149.0)
VLDL: 34.2 mg/dL (ref 0.0–40.0)

## 2022-04-18 LAB — CBC
HCT: 40.3 % (ref 36.0–46.0)
Hemoglobin: 13.5 g/dL (ref 12.0–15.0)
MCHC: 33.5 g/dL (ref 30.0–36.0)
MCV: 93.1 fl (ref 78.0–100.0)
Platelets: 156 10*3/uL (ref 150.0–400.0)
RBC: 4.33 Mil/uL (ref 3.87–5.11)
RDW: 12.9 % (ref 11.5–15.5)
WBC: 4.5 10*3/uL (ref 4.0–10.5)

## 2022-04-18 LAB — HEMOGLOBIN A1C: Hgb A1c MFr Bld: 6.2 % (ref 4.6–6.5)

## 2022-05-13 DIAGNOSIS — K625 Hemorrhage of anus and rectum: Secondary | ICD-10-CM | POA: Diagnosis not present

## 2022-05-13 DIAGNOSIS — R195 Other fecal abnormalities: Secondary | ICD-10-CM | POA: Diagnosis not present

## 2022-05-13 DIAGNOSIS — R1033 Periumbilical pain: Secondary | ICD-10-CM | POA: Diagnosis not present

## 2022-05-13 DIAGNOSIS — I739 Peripheral vascular disease, unspecified: Secondary | ICD-10-CM | POA: Diagnosis not present

## 2022-05-22 ENCOUNTER — Telehealth: Payer: Self-pay

## 2022-05-22 NOTE — Telephone Encounter (Signed)
   Name: [name]  DOB: Oct 02, 1955  MRN: [id number]  Primary Cardiologist: Kirk Ruths, MD   Preoperative team, please contact this patient and set up a phone call appointment for further preoperative risk assessment. Please obtain consent and complete medication review. Thank you for your help.  I confirm that guidance regarding antiplatelet and oral anticoagulation therapy has been completed and, if necessary, noted below:  Rollen Sox, [facility]  Pharmacist Specialty:  Pharmacist Telephone Encounter Signed Creation Time:  05/22/2022  4:48 PM   Signed      Patient with diagnosis of chronic DVT on Xarelto for anticoagulation. Has history of stroke but appears to have been ruled out.   Procedure: egd and colonoscopy Date of procedure: 05/29/22     CrCl 67 mL/min Platelet count 156K   Due to recurrent DVT, patient can hold Xarelto for 1 day prior to procedure.  If more than 1 day is needed, will need clearance from Dr Stanford Breed.   **This guidance is not considered finalized until pre-operative APP has relayed final recommendations.Murray Hodgkins, NP 05/22/2022, [place]

## 2022-05-22 NOTE — Telephone Encounter (Signed)
I tried to call the pt to schedule a tele pre op appt, but someone answered the phone and then hung up.

## 2022-05-22 NOTE — Telephone Encounter (Signed)
Patient with diagnosis of chronic DVT on Xarelto for anticoagulation. Has history of stroke but appears to have been ruled out.  Procedure: egd and colonoscopy Date of procedure: 05/29/22   CrCl 67 mL/min Platelet count 156K  Due to recurrent DVT, patient can hold Xarelto for 1 day prior to procedure.  If more than 1 day is needed, will need clearance from Dr Stanford Breed.  **This guidance is not considered finalized until pre-operative APP has relayed final recommendations.**

## 2022-05-22 NOTE — Telephone Encounter (Signed)
   Pre-operative Risk Assessment    Patient Name: [name]  DOB: 01-28-1956 MRN: [id number]      Request for Surgical Clearance    Procedure:   EGD AND COLONOSCOPY   Date of Surgery:  Clearance 05/29/22                                 Surgeon:  DR Benson Norway Surgeon's Group or Practice Name:  [facility] Phone number:  [phone]  Fax number:  [phone]   Type of Clearance Requested:   - Medical  - Pharmacy:  Hold Rivaroxaban (Xarelto)     Type of Anesthesia:   PROPOFOL   Additional requests/questions:

## 2022-05-23 ENCOUNTER — Telehealth: Payer: Self-pay | Admitting: *Deleted

## 2022-05-23 NOTE — Telephone Encounter (Signed)
Pt agreeable to plan of care for tele pre op appt 05/27/22 @ 3:20. Pt added on due to date of procedure and med hold.   Med rec and consent are done.     Patient Consent for Virtual Visit        [name] has provided verbal consent on 05/23/2022 for a virtual visit (video or telephone).   CONSENT FOR VIRTUAL VISIT FOR:  [name]  By participating in this virtual visit I agree to the following:  I hereby voluntarily request, consent and authorize [facility] and its employed or contracted physicians, physician assistants, nurse practitioners or other licensed health care professionals (the Practitioner), to provide me with telemedicine health care services (the "Services") as deemed necessary by the treating Practitioner. I acknowledge and consent to receive the Services by the Practitioner via telemedicine. I understand that the telemedicine visit will involve communicating with the Practitioner through live audiovisual communication technology and the disclosure of certain medical information by electronic transmission. I acknowledge that I have been given the opportunity to request an in-person assessment or other available alternative prior to the telemedicine visit and am voluntarily participating in the telemedicine visit.  I understand that I have the right to withhold or withdraw my consent to the use of telemedicine in the course of my care at any time, without affecting my right to future care or treatment, and that the Practitioner or I may terminate the telemedicine visit at any time. I understand that I have the right to inspect all information obtained and/or recorded in the course of the telemedicine visit and may receive copies of available information for a reasonable fee.  I understand that some of the potential risks of receiving the Services via telemedicine include:  Delay or interruption in medical evaluation due to technological equipment failure  or disruption; Information transmitted may not be sufficient (e.g. poor resolution of images) to allow for appropriate medical decision making by the Practitioner; and/or  In rare instances, security protocols could fail, causing a breach of personal health information.  Furthermore, I acknowledge that it is my responsibility to provide information about my medical history, conditions and care that is complete and accurate to the best of my ability. I acknowledge that Practitioner's advice, recommendations, and/or decision may be based on factors not within their control, such as incomplete or inaccurate data provided by me or distortions of diagnostic images or specimens that may result from electronic transmissions. I understand that the practice of medicine is not an exact science and that Practitioner makes no warranties or guarantees regarding treatment outcomes. I acknowledge that a copy of this consent can be made available to me via my patient portal ([facility]), or I can request a printed copy by calling the office of [facility].    I understand that my insurance will be billed for this visit.   I have read or had this consent read to me. I understand the contents of this consent, which adequately explains the benefits and risks of the Services being provided via telemedicine.  I have been provided ample opportunity to ask questions regarding this consent and the Services and have had my questions answered to my satisfaction. I give my informed consent for the services to be provided through the use of telemedicine in my medical care

## 2022-05-23 NOTE — Telephone Encounter (Signed)
Pt agreeable to plan of care for tele pre op appt 05/27/22 @ 3:20. Pt added on due to date of procedure and med hold.    Med rec and consent are done.

## 2022-05-23 NOTE — Telephone Encounter (Signed)
Pt's husband answered the phone today and took a message for the pt to call us back to set up a tele pre op appt.   Pt phone had a lot of static and made it had to hear on both ends.

## 2022-05-27 ENCOUNTER — Ambulatory Visit: Payer: 59 | Attending: Cardiology | Admitting: Nurse Practitioner

## 2022-05-27 DIAGNOSIS — Z01810 Encounter for preprocedural cardiovascular examination: Secondary | ICD-10-CM | POA: Diagnosis not present

## 2022-05-27 NOTE — Progress Notes (Signed)
Virtual Visit via Telephone Note   Because of [name]'s co-morbid illnesses, she is at least at moderate risk for complications without adequate follow up.  This format is felt to be most appropriate for this patient at this time.  The patient did not have access to video technology/had technical difficulties with video requiring transitioning to audio format only (telephone).  All issues noted in this document were discussed and addressed.  No physical exam could be performed with this format.  Please refer to the patient's chart for her consent to telehealth for [facility].  Evaluation Performed:  Preoperative cardiovascular risk assessment _____________   Date:  05/27/2022   Patient ID:  [name], [name] 08-20-1955, MRN [id number] Patient Location:  Home Provider location:   Office  Primary Care Provider:  Darreld Mclean, MD Primary Cardiologist:  Kirk Ruths, MD  Chief Complaint / Patient Profile   66 y.o. y/o female with a h/o atypical chest pain (no CAD on coronary CTA in 09/2021), hypertension, hyperlipidemia, and recurrent DVT who is pending EGD and colonoscopy on 05/29/2022 with Dr. Benson Norway of [facility] and presents today for telephonic preoperative cardiovascular risk assessment.  Past Medical History    Past Medical History:  Diagnosis Date   Bilateral leg pain 07/02/2016   Cancer ([place]) 08/03/2010   colon   DVT (deep venous thrombosis) (HCC)    recurrent; on long term anticoag (Rivaroxaban)   History of colon cancer, stage I 08/17/2015   HLD (hyperlipidemia)    Hypertension    Prediabetes 05/25/2019   Uterine fibroid    Varicose 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 08/26/2014   Past Surgical History:  Procedure Laterality Date   ABDOMINAL HYSTERECTOMY     CHOLECYSTECTOMY     COLON SURGERY     GALLBLADDER SURGERY     MEDIASTERNOTOMY N/A 11/24/2017   Procedure: PARTIAL STERNOTOMY;  Surgeon: [name],  MD;  Location: [facility];  Service: Thoracic;  Laterality: N/A;   THYMECTOMY  11/24/2017   Procedure: THYMECTOMY;  Surgeon: [name], MD;  Location: [facility] OR;  Service: Thoracic;;    Allergies  No Known Allergies  History of Present Illness    [name] is a 66 y.o. female who presents via audio/video conferencing for a telehealth visit today.  Pt was last seen in cardiology clinic on 11/28/2021 by Dr. Stanford Breed.  At that time Annaliese Grassia was doing well. BP was somewhat elevated, she was started on chlorthalidone 12.5 mg daily.  The patient is now pending procedure as outlined above. Since her last visit, she has been stable from a cardiac standpoint. She does note occasional chest discomfort, unchanged from previous visits and since the time of her coronary CTA. BP is improved.She denies palpitations, dyspnea, pnd, orthopnea, n, v, dizziness, syncope, edema, weight gain, or early satiety. All other systems reviewed and are otherwise negative except as noted above.   Home Medications    Prior to Admission medications   Medication Sig Start Date End Date Taking? Authorizing Provider  amLODipine (NORVASC) 10 MG tablet Take 1 tablet (10 mg total) by mouth at bedtime. 09/05/21   Lelon Perla, MD  chlorthalidone (HYGROTON) 25 MG tablet Take 0.5 tablets (12.5 mg total) by mouth daily. Patient not taking: Reported on 04/17/2022 11/28/21   Lelon Perla, MD  Evolocumab (REPATHA SURECLICK) 664 MG/ML SOAJ Repatha SureClick 403 mg/mL subcutaneous pen injector  INJECT 1 PEN INTO THE SKIN EVERY 14 (FOURTEEN) DAYS.  02/13/22   Freada Bergeron, MD  lisinopril (ZESTRIL) 40 MG tablet TAKE 1.5 TABLETS (60 MG TOTAL) BY MOUTH EVERY MORNING. 01/14/22   Copland, Gay Filler, MD  XARELTO 20 MG TABS tablet TAKE 1 TABLET(20 MG) BY MOUTH DAILY WITH SUPPER 08/23/21   Copland, Gay Filler, MD    Physical Exam    Vital Signs:  Pasha Varnell does not have vital signs available for review  today.  Given telephonic nature of communication, physical exam is limited. AAOx3. NAD. Normal affect.  Speech and respirations are unlabored.  Accessory Clinical Findings    None  Assessment & Plan    1.  Preoperative Cardiovascular Risk Assessment: According to the Revised Cardiac Risk Index (RCRI), her Perioperative Risk of Major Cardiac Event is (%): 0.4. Her Functional Capacity in METs is: 7.01 according to the Duke Activity Status Index (DASI).Therefore, based on ACC/AHA guidelines, patient would be at acceptable risk for the planned procedure without further cardiovascular testing.  The patient was advised that if she develops new symptoms prior to surgery to contact our office to arrange for a follow-up visit, and she verbalized understanding.   Due to recurrent DVT, patient can hold Xarelto for 1 day prior to procedure.  If more than 1 day is needed, will need clearance from Dr Stanford Breed. Please resume Xarelto as soon as possible postprocedure, the discretion of the surgeon.  A copy of this note will be routed to requesting surgeon.  Time:   Today, I have spent 6 minutes with the patient with telehealth technology discussing medical history, symptoms, and management plan.     Lenna Sciara, NP  05/27/2022, 3:33 PM

## 2022-05-29 DIAGNOSIS — K2970 Gastritis, unspecified, without bleeding: Secondary | ICD-10-CM | POA: Diagnosis not present

## 2022-05-29 DIAGNOSIS — K449 Diaphragmatic hernia without obstruction or gangrene: Secondary | ICD-10-CM | POA: Diagnosis not present

## 2022-05-29 DIAGNOSIS — K625 Hemorrhage of anus and rectum: Secondary | ICD-10-CM | POA: Diagnosis not present

## 2022-05-29 DIAGNOSIS — Z980 Intestinal bypass and anastomosis status: Secondary | ICD-10-CM | POA: Diagnosis not present

## 2022-05-29 DIAGNOSIS — K2100 Gastro-esophageal reflux disease with esophagitis, without bleeding: Secondary | ICD-10-CM | POA: Diagnosis not present

## 2022-06-26 DIAGNOSIS — R1033 Periumbilical pain: Secondary | ICD-10-CM | POA: Diagnosis not present

## 2022-06-28 ENCOUNTER — Other Ambulatory Visit (HOSPITAL_BASED_OUTPATIENT_CLINIC_OR_DEPARTMENT_OTHER): Payer: Self-pay | Admitting: Gastroenterology

## 2022-06-28 DIAGNOSIS — R1033 Periumbilical pain: Secondary | ICD-10-CM

## 2022-07-23 ENCOUNTER — Ambulatory Visit (HOSPITAL_COMMUNITY)
Admission: RE | Admit: 2022-07-23 | Discharge: 2022-07-23 | Disposition: A | Discharge status: Home / Self Care | Payer: 59 | Source: Ambulatory Visit | Attending: Gastroenterology | Admitting: Gastroenterology

## 2022-07-23 DIAGNOSIS — R1033 Periumbilical pain: Secondary | ICD-10-CM | POA: Insufficient documentation

## 2022-07-23 MED ORDER — IOHEXOL 300 MG/ML  SOLN
100.0000 mL | Freq: Once | INTRAMUSCULAR | Status: AC | PRN
  Administered 2022-07-23: 100 mL via INTRAVENOUS

## 2022-07-23 MED ORDER — IOHEXOL 9 MG/ML PO SOLN
1000.0000 mL | Freq: Once | ORAL | Status: AC
  Administered 2022-07-23: 1000 mL via ORAL

## 2022-08-20 ENCOUNTER — Telehealth: Payer: Self-pay | Admitting: Pharmacist

## 2022-08-20 MED ORDER — REPATHA SURECLICK 140 MG/ML ~~LOC~~ SOAJ: SUBCUTANEOUS | 11 refills | Status: DC

## 2022-08-20 NOTE — Telephone Encounter (Signed)
PA approved through 08/21/23, refill sent to pharmacy.

## 2022-08-20 NOTE — Telephone Encounter (Signed)
Received fax from pharmacy that prior auth is needed for [name]. This has been submitted, key [id number].

## 2022-08-26 ENCOUNTER — Other Ambulatory Visit: Payer: Self-pay | Admitting: Family Medicine

## 2022-08-26 DIAGNOSIS — Z7901 Long term (current) use of anticoagulants: Secondary | ICD-10-CM

## 2022-08-26 DIAGNOSIS — K2100 Gastro-esophageal reflux disease with esophagitis, without bleeding: Secondary | ICD-10-CM | POA: Diagnosis not present

## 2022-10-31 DIAGNOSIS — G5601 Carpal tunnel syndrome, right upper limb: Secondary | ICD-10-CM | POA: Diagnosis not present

## 2022-10-31 DIAGNOSIS — R200 Anesthesia of skin: Secondary | ICD-10-CM | POA: Diagnosis not present

## 2022-11-27 DIAGNOSIS — R059 Cough, unspecified: Secondary | ICD-10-CM | POA: Diagnosis not present

## 2022-12-26 DIAGNOSIS — H04123 Dry eye syndrome of bilateral lacrimal glands: Secondary | ICD-10-CM | POA: Diagnosis not present

## 2022-12-26 DIAGNOSIS — H2513 Age-related nuclear cataract, bilateral: Secondary | ICD-10-CM | POA: Diagnosis not present

## 2022-12-26 DIAGNOSIS — H5203 Hypermetropia, bilateral: Secondary | ICD-10-CM | POA: Diagnosis not present

## 2022-12-26 DIAGNOSIS — H524 Presbyopia: Secondary | ICD-10-CM | POA: Diagnosis not present

## 2023-01-10 NOTE — Patient Instructions (Incomplete)
Good to see you again today I would recommend getting the covid series, shingles vaccine series and pneumonia vaccine  I sent a message to your heart doctor and asked them to give you a call and schedule your annual visit  Please stop by lab, then go to imaging on the ground floor to have back x-rays done

## 2023-01-10 NOTE — Progress Notes (Unsigned)
[facility] [address], [address] [place], [place] [address] [phone]  Date:  01/13/2023   Name:  [name]   DOB:  1955-08-21   MRN:  [id number]  PCP:  Pearline Cables, MD    Chief Complaint: No chief complaint on file.   History of Present Illness:  [name] is a 67 y.o. very pleasant female patient who presents with the following:  Pt seen today for recheck- She has history of colon cancer, recurrent DVT on xarelto, hypertension, prediabetes, stage I thymoma status post thymectomy 2019   Last seen by myself in September of 2023  Recommend shingirx Recommend pneumonia vaccine Labs done in September  Amlodipine Chlorthalidone Repatha Lisinopril Xarelto   Patient Active Problem List   Diagnosis Date Noted   Stroke-like symptoms 03/21/2020   HLD (hyperlipidemia)    COVID-19 virus infection    Elevated liver enzymes    Ischemic stroke (HCC)    Statin myopathy 12/29/2019   Prediabetes 05/25/2019   S/P thymectomy 11/24/2017   Bilateral leg pain 07/02/2016   History of colon cancer, stage I 08/17/2015   Varicose 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 08/26/2014   HTN (hypertension) 12/24/2013   Cancer of left colon (HCC) 02/25/2012   DVT (deep venous thrombosis) (HCC) 08/27/2011    Past Medical History:  Diagnosis Date   Bilateral leg pain 07/02/2016   Cancer (HCC) 08/03/2010   colon   DVT (deep venous thrombosis) (HCC)    recurrent; on long term anticoag (Rivaroxaban)   History of colon cancer, stage I 08/17/2015   HLD (hyperlipidemia)    Hypertension    Prediabetes 05/25/2019   Uterine fibroid    Varicose 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 08/26/2014    Past Surgical History:  Procedure Laterality Date   ABDOMINAL HYSTERECTOMY     CHOLECYSTECTOMY     COLON SURGERY     GALLBLADDER SURGERY     MEDIASTERNOTOMY N/A 11/24/2017   Procedure: PARTIAL STERNOTOMY;  Surgeon:  Loreli Slot, MD;  Location: [facility] OR;  Service: Thoracic;  Laterality: N/A;   THYMECTOMY  11/24/2017   Procedure: THYMECTOMY;  Surgeon: Loreli Slot, MD;  Location: [facility] OR;  Service: Thoracic;;    Social History   Tobacco Use   Smoking status: Never   Smokeless tobacco: Never  Vaping Use   Vaping Use: Never used  Substance Use Topics   Alcohol use: No    Alcohol/week: 0.0 standard drinks of alcohol   Drug use: No    Family History  Problem Relation Age of Onset   Heart attack Mother    Hypertension Father     No Known Allergies  Medication list has been reviewed and updated.  Current Outpatient Medications on File Prior to Visit  Medication Sig Dispense Refill   amLODipine (NORVASC) 10 MG tablet Take 1 tablet (10 mg total) by mouth at bedtime. 90 tablet 3   chlorthalidone (HYGROTON) 25 MG tablet Take 0.5 tablets (12.5 mg total) by mouth daily. (Patient not taking: Reported on 04/17/2022) 45 tablet 3   Evolocumab (REPATHA SURECLICK) 140 MG/ML SOAJ Repatha SureClick 140 mg/mL subcutaneous pen injector  INJECT 1 PEN INTO THE SKIN EVERY 14 (FOURTEEN) DAYS. 2 mL 11   lisinopril (ZESTRIL) 40 MG tablet TAKE 1.5 TABLETS (60 MG TOTAL) BY MOUTH EVERY MORNING. 135 tablet 4   XARELTO 20 MG TABS tablet TAKE 1 TABLET(20 MG) BY MOUTH DAILY  WITH SUPPER 90 tablet 3   No current facility-administered medications on file prior to visit.    Review of Systems:  As per HPI- otherwise negative.   Physical Examination: There were no vitals filed for this visit. There were no vitals filed for this visit. There is no height or weight on file to calculate BMI. Ideal Body Weight:    GEN: no acute distress. HEENT: Atraumatic, Normocephalic.  Ears and Nose: No external deformity. CV: RRR, No M/G/R. No JVD. No thrill. No extra heart sounds. PULM: CTA B, no wheezes, crackles, rhonchi. No retractions. No resp. distress. No accessory muscle use. ABD: S, NT, ND, +BS. No rebound. No  HSM. EXTR: No c/c/e PSYCH: Normally interactive. Conversant.    Assessment and Plan: ***  Signed Abbe Amsterdam, MD

## 2023-01-13 ENCOUNTER — Ambulatory Visit (HOSPITAL_BASED_OUTPATIENT_CLINIC_OR_DEPARTMENT_OTHER)
Admission: RE | Admit: 2023-01-13 | Discharge: 2023-01-13 | Disposition: A | Discharge status: Home / Self Care | Payer: Medicare Other | Source: Ambulatory Visit | Attending: Family Medicine | Admitting: Family Medicine

## 2023-01-13 ENCOUNTER — Ambulatory Visit (INDEPENDENT_AMBULATORY_CARE_PROVIDER_SITE_OTHER): Payer: Medicare Other | Admitting: Family Medicine

## 2023-01-13 ENCOUNTER — Encounter: Payer: Self-pay | Admitting: Family Medicine

## 2023-01-13 VITALS — BP 122/60 | HR 60 | Temp 97.6°F | Resp 18 | Ht 66.0 in | Wt 188.6 lb

## 2023-01-13 DIAGNOSIS — R7303 Prediabetes: Secondary | ICD-10-CM | POA: Diagnosis not present

## 2023-01-13 DIAGNOSIS — M545 Low back pain, unspecified: Secondary | ICD-10-CM | POA: Diagnosis not present

## 2023-01-13 DIAGNOSIS — R0989 Other specified symptoms and signs involving the circulatory and respiratory systems: Secondary | ICD-10-CM | POA: Diagnosis not present

## 2023-01-13 DIAGNOSIS — R5383 Other fatigue: Secondary | ICD-10-CM

## 2023-01-13 DIAGNOSIS — E785 Hyperlipidemia, unspecified: Secondary | ICD-10-CM

## 2023-01-13 DIAGNOSIS — M5441 Lumbago with sciatica, right side: Secondary | ICD-10-CM

## 2023-01-13 DIAGNOSIS — I10 Essential (primary) hypertension: Secondary | ICD-10-CM | POA: Diagnosis not present

## 2023-01-13 DIAGNOSIS — G8929 Other chronic pain: Secondary | ICD-10-CM

## 2023-01-13 DIAGNOSIS — E559 Vitamin D deficiency, unspecified: Secondary | ICD-10-CM

## 2023-01-13 LAB — COMPREHENSIVE METABOLIC PANEL
ALT: 15 U/L (ref 0–35)
AST: 17 U/L (ref 0–37)
Albumin: 4.7 g/dL (ref 3.5–5.2)
Alkaline Phosphatase: 79 U/L (ref 39–117)
BUN: 19 mg/dL (ref 6–23)
CO2: 30 mEq/L (ref 19–32)
Calcium: 9.6 mg/dL (ref 8.4–10.5)
Chloride: 104 mEq/L (ref 96–112)
Creatinine, Ser: 1.08 mg/dL (ref 0.40–1.20)
GFR: 52.53 mL/min — ABNORMAL LOW (ref >60.00)
Glucose, Bld: 105 mg/dL — ABNORMAL HIGH (ref 70–99)
Potassium: 4.6 mEq/L (ref 3.5–5.1)
Sodium: 144 mEq/L (ref 135–145)
Total Bilirubin: 0.7 mg/dL (ref 0.2–1.2)
Total Protein: 6.8 g/dL (ref 6.0–8.3)

## 2023-01-13 LAB — LIPID PANEL
Cholesterol: 150 mg/dL (ref 0–200)
HDL: 60.7 mg/dL (ref >39.00)
LDL Cholesterol: 71 mg/dL (ref 0–99)
NonHDL: 89.57
Total CHOL/HDL Ratio: 2
Triglycerides: 95 mg/dL (ref 0.0–149.0)
VLDL: 19 mg/dL (ref 0.0–40.0)

## 2023-01-13 LAB — CBC
HCT: 42.2 % (ref 36.0–46.0)
Hemoglobin: 13.8 g/dL (ref 12.0–15.0)
MCHC: 32.8 g/dL (ref 30.0–36.0)
MCV: 94 fl (ref 78.0–100.0)
Platelets: 169 10*3/uL (ref 150.0–400.0)
RBC: 4.49 Mil/uL (ref 3.87–5.11)
RDW: 13.3 % (ref 11.5–15.5)
WBC: 4.2 10*3/uL (ref 4.0–10.5)

## 2023-01-13 LAB — HEMOGLOBIN A1C: Hgb A1c MFr Bld: 5.8 % (ref 4.6–6.5)

## 2023-01-13 LAB — TSH: TSH: 2.52 u[IU]/mL (ref 0.35–5.50)

## 2023-01-13 LAB — VITAMIN D 25 HYDROXY (VIT D DEFICIENCY, FRACTURES): VITD: 18.33 ng/mL — ABNORMAL LOW (ref 30.00–100.00)

## 2023-01-13 MED ORDER — LISINOPRIL 40 MG PO TABS
ORAL_TABLET | ORAL | 3 refills | Status: DC
Start: 2023-01-13 — End: 2023-07-09

## 2023-01-13 MED ORDER — CHLORTHALIDONE 25 MG PO TABS: 12.5000 mg | ORAL_TABLET | Freq: Every day | ORAL | 3 refills | Status: DC

## 2023-01-14 MED ORDER — VITAMIN D3 1.25 MG (50000 UT) PO CAPS
ORAL_CAPSULE | ORAL | 0 refills | Status: AC
Start: 2023-01-14 — End: ?

## 2023-01-17 ENCOUNTER — Encounter: Payer: Self-pay | Admitting: Family Medicine

## 2023-01-20 ENCOUNTER — Encounter: Payer: Self-pay | Admitting: *Deleted

## 2023-04-03 ENCOUNTER — Telehealth: Payer: Self-pay | Admitting: Family Medicine

## 2023-04-03 NOTE — Telephone Encounter (Signed)
Copied from CRM [id number]. Topic: Medicare AWV >> Apr 03, 2023  1:54 PM Payton Doughty wrote: Reason for CRM: LM 04/03/23 to R/S AWV appt - [facility]  Verlee Rossetti; Care Guide Ambulatory Clinical Support [place] [facility] Direct Dial: [phone]

## 2023-04-03 NOTE — Progress Notes (Signed)
HPI: FU HTN.  Also with history of CP, recurrent DVT on Xarelto, hyperlipidemia, previous mediastinal mass status post thymectomy.  Echocardiogram August 2021 showed normal LV function with grade 1 diastolic dysfunction.  Venous Dopplers August 2021 showed chronic DVT involving right peroneal vein and chronic DVT left peroneal vein.  MRA August 2021 showed no high-grade narrowing, dissection or aneurysm within the neck.  Cardiac CTA February 2023 showed calcium score 0 and no coronary disease.  Since last seen she has occasional chest pain that can occur either with activities or at rest and unchanged.  It is in the left chest area without radiation.  She denies dyspnea on exertion.  Her blood pressure runs high at home.  She denies syncope.  Current Outpatient Medications  Medication Sig Dispense Refill   amLODipine (NORVASC) 10 MG tablet Take 1 tablet (10 mg total) by mouth at bedtime. 90 tablet 3   chlorthalidone (HYGROTON) 25 MG tablet Take 0.5 tablets (12.5 mg total) by mouth daily. 45 tablet 3   Cholecalciferol (VITAMIN D3) 1.25 MG (50000 UT) CAPS Take 1 weekly for 12 weeks 12 capsule 0   Evolocumab (REPATHA SURECLICK) 140 MG/ML SOAJ Repatha SureClick 140 mg/mL subcutaneous pen injector  INJECT 1 PEN INTO THE SKIN EVERY 14 (FOURTEEN) DAYS. 2 mL 11   lisinopril (ZESTRIL) 40 MG tablet TAKE 1.5 TABLETS (60 MG TOTAL) BY MOUTH EVERY MORNING. 135 tablet 3   XARELTO 20 MG TABS tablet TAKE 1 TABLET(20 MG) BY MOUTH DAILY WITH SUPPER 90 tablet 3   No current facility-administered medications for this visit.     Past Medical History:  Diagnosis Date   Bilateral leg pain 07/02/2016   Cancer (HCC) 08/03/2010   colon   DVT (deep venous thrombosis) (HCC)    recurrent; on long term anticoag (Rivaroxaban)   History of colon cancer, stage I 08/17/2015   HLD (hyperlipidemia)    Hypertension    Prediabetes 05/25/2019   Uterine fibroid    Varicose 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 08/26/2014     Past Surgical History:  Procedure Laterality Date   ABDOMINAL HYSTERECTOMY     CHOLECYSTECTOMY     COLON SURGERY     GALLBLADDER SURGERY     MEDIASTERNOTOMY N/A 11/24/2017   Procedure: PARTIAL STERNOTOMY;  Surgeon: Loreli Slot, MD;  Location: [facility] OR;  Service: Thoracic;  Laterality: N/A;   THYMECTOMY  11/24/2017   Procedure: THYMECTOMY;  Surgeon: Loreli Slot, MD;  Location: [facility] OR;  Service: Thoracic;;    Social History   Socioeconomic History   Marital status: Married    Spouse name: Not on file   Number of children: 1   Years of education: Bachelors   Highest education level: Not on file  Occupational History   Not on file  Tobacco Use   Smoking status: Never   Smokeless tobacco: Never  Vaping Use   Vaping status: Never Used  Substance and Sexual Activity   Alcohol use: No    Alcohol/week: 0.0 standard drinks of alcohol   Drug use: No   Sexual activity: Not on file  Other Topics Concern   Not on file  Social History Narrative   Lives at home with husband and son.   Right-handed.   No caffeine use.   Social Determinants of Health   Financial Resource Strain: Low Risk  (04/03/2022)   Overall Financial Resource Strain (CARDIA)    Difficulty of Paying Living Expenses: Not hard at all  Food Insecurity: No Food Insecurity (04/03/2022)   Hunger Vital Sign    Worried About Running Out of Food in the Last Year: Never true    Ran Out of Food in the Last Year: Never true  Transportation Needs: No Transportation Needs (04/03/2022)   PRAPARE - Administrator, Civil Service (Medical): No    Lack of Transportation (Non-Medical): No  Physical Activity: Insufficiently Active (04/03/2022)   Exercise Vital Sign    Days of Exercise per Week: 4 days    Minutes of Exercise per Session: 30 min  Stress: No Stress Concern Present (04/03/2022)   [employer] of Occupational Health - Occupational Stress Questionnaire    Feeling of Stress : Not at all   Social Connections: Moderately Integrated (04/03/2022)   Social Connection and Isolation Panel [NHANES]    Frequency of Communication with Friends and Family: More than three times a week    Frequency of Social Gatherings with Friends and Family: Twice a week    Attends Religious Services: More than 4 times per year    Active Member of [employer] or Organizations: No    Attends Banker Meetings: Never    Marital Status: Married  Catering manager Violence: Not At Risk (04/03/2022)   Humiliation, Afraid, Rape, and Kick questionnaire    Fear of Current or Ex-Partner: No    Emotionally Abused: No    Physically Abused: No    Sexually Abused: No    Family History  Problem Relation Age of Onset   Heart attack Mother    Hypertension Father     ROS: no fevers or chills, productive cough, hemoptysis, dysphasia, odynophagia, melena, hematochezia, dysuria, hematuria, rash, seizure activity, orthopnea, PND, pedal edema, claudication. Remaining systems are negative.  Physical Exam: Well-developed well-nourished in no acute distress.  Skin is warm and dry.  HEENT is normal.  Neck is supple.  Chest is clear to auscultation with normal expansion.  Cardiovascular exam is regular rate and rhythm.  Abdominal exam nontender or distended. No masses palpated. Extremities show no edema. neuro grossly intact  EKG Interpretation Date/Time:  Wednesday April 09 2023 09:44:09 EDT Ventricular Rate:  50 PR Interval:  124 QRS Duration:  82 QT Interval:  474 QTC Calculation: 432 R Axis:   40  Text Interpretation: Sinus bradycardia When compared with ECG of 04-Apr-2020 21:40, ST no longer depressed in Inferior leads Nonspecific T wave abnormality now evident in Inferior leads Nonspecific T wave abnormality now evident in Lateral leads QT has shortened Confirmed by Olga Millers ([id number]) on 04/09/2023 9:53:12 AM    A/P  1 hypertension-patient's blood pressure is elevated; add hydralazine 25 mg  3 times daily and follow.  2 history of chest pain-she continues to have occasional chest pain that is atypical.  Electrocardiogram shows no new ST changes.  Previous CTA showed no coronary disease.  Will follow for now.  3 hyperlipidemia-continue Repatha.  She is intolerant to statins.  4 history of recurrent DVTs-continue Xarelto.  Olga Millers, MD

## 2023-04-06 ENCOUNTER — Other Ambulatory Visit: Payer: Self-pay | Admitting: Family Medicine

## 2023-04-06 DIAGNOSIS — E559 Vitamin D deficiency, unspecified: Secondary | ICD-10-CM

## 2023-04-07 DIAGNOSIS — S41131A Puncture wound without foreign body of right upper arm, initial encounter: Secondary | ICD-10-CM | POA: Diagnosis not present

## 2023-04-07 DIAGNOSIS — T63461A Toxic effect of venom of wasps, accidental (unintentional), initial encounter: Secondary | ICD-10-CM | POA: Diagnosis not present

## 2023-04-07 DIAGNOSIS — R030 Elevated blood-pressure reading, without diagnosis of hypertension: Secondary | ICD-10-CM | POA: Diagnosis not present

## 2023-04-07 DIAGNOSIS — S41132A Puncture wound without foreign body of left upper arm, initial encounter: Secondary | ICD-10-CM | POA: Diagnosis not present

## 2023-04-09 ENCOUNTER — Ambulatory Visit: Payer: Medicare Other | Attending: Cardiology | Admitting: Cardiology

## 2023-04-09 ENCOUNTER — Encounter: Payer: Self-pay | Admitting: Cardiology

## 2023-04-09 VITALS — BP 157/77 | HR 50 | Ht 66.0 in | Wt 188.0 lb

## 2023-04-09 DIAGNOSIS — I10 Essential (primary) hypertension: Secondary | ICD-10-CM | POA: Diagnosis not present

## 2023-04-09 DIAGNOSIS — R072 Precordial pain: Secondary | ICD-10-CM

## 2023-04-09 DIAGNOSIS — I82403 Acute embolism and thrombosis of unspecified deep veins of lower extremity, bilateral: Secondary | ICD-10-CM | POA: Diagnosis not present

## 2023-04-09 DIAGNOSIS — E785 Hyperlipidemia, unspecified: Secondary | ICD-10-CM | POA: Diagnosis not present

## 2023-04-09 MED ORDER — HYDRALAZINE HCL 25 MG PO TABS: 25.0000 mg | ORAL_TABLET | Freq: Three times a day (TID) | ORAL | 3 refills | Status: AC

## 2023-04-09 NOTE — Patient Instructions (Signed)
Medication Instructions:   START Hydralazine one (1) tablet by mouth ( 25 mg ) three times daily.   *If you need a refill on your cardiac medications before your next appointment, please call your pharmacy*   Lab Work:  None ordered.  If you have labs (blood work) drawn today and your tests are completely normal, you will receive your results only by: MyChart Message (if you have MyChart) OR A paper copy in the mail If you have any lab test that is abnormal or we need to change your treatment, we will call you to review the results.   Testing/Procedures:  None ordered.   Follow-Up: At [facility], you and your health needs are our priority.  As part of our continuing mission to provide you with exceptional heart care, we have created designated Provider Care Teams.  These Care Teams include your primary Cardiologist (physician) and Advanced Practice Providers (APPs -  Physician Assistants and Nurse Practitioners) who all work together to provide you with the care you need, when you need it.  We recommend signing up for the patient portal called "MyChart".  Sign up information is provided on this After Visit Summary.  MyChart is used to connect with patients for Virtual Visits (Telemedicine).  Patients are able to view lab/test results, encounter notes, upcoming appointments, etc.  Non-urgent messages can be sent to your provider as well.   To learn more about what you can do with MyChart, go to [email or web address].    Your next appointment:   1 year(s)  Provider:   Olga Millers, MD    Other Instructions  Your physician wants you to follow-up in: 1 year.  You will receive a reminder letter in the mail two months in advance. If you don't receive a letter, please call our office to schedule the follow-up appointment.

## 2023-04-15 ENCOUNTER — Telehealth: Payer: Self-pay | Admitting: Family Medicine

## 2023-04-15 NOTE — Telephone Encounter (Signed)
Pt came in office stating for provider to supply pt if possible a coupon for Xarelto. Pt mentioned if the coupon is available to send to [facility] at [address] Tel [phone] or if not to call pt at [phone] to pick up coupon. Please advise.

## 2023-07-04 ENCOUNTER — Ambulatory Visit (INDEPENDENT_AMBULATORY_CARE_PROVIDER_SITE_OTHER): Payer: Medicare Other | Admitting: *Deleted

## 2023-07-04 DIAGNOSIS — Z0000 Encounter for general adult medical examination without abnormal findings: Secondary | ICD-10-CM

## 2023-07-04 DIAGNOSIS — Z780 Asymptomatic menopausal state: Secondary | ICD-10-CM | POA: Diagnosis not present

## 2023-07-04 DIAGNOSIS — Z1231 Encounter for screening mammogram for malignant neoplasm of breast: Secondary | ICD-10-CM

## 2023-07-04 NOTE — Progress Notes (Signed)
Subjective:   [name] is a 67 y.o. female who presents for Medicare Annual (Subsequent) preventive examination.  Visit Complete: Virtual I connected with  [name] on 07/04/23 by a audio enabled telemedicine application and verified that I am speaking with the correct person using two identifiers.  Patient Location: Home  Provider Location: Office/Clinic  I discussed the limitations of evaluation and management by telemedicine. The patient expressed understanding and agreed to proceed.  Vital Signs: Because this visit was a virtual/telehealth visit, some criteria may be missing or patient reported. Any vitals not documented were not able to be obtained and vitals that have been documented are patient reported.   Cardiac Risk Factors include: advanced age (>70men, >56 women);dyslipidemia;hypertension     Objective:    There were no vitals filed for this visit. There is no height or weight on file to calculate BMI.     07/04/2023    1:48 PM 04/03/2022    3:04 PM 04/04/2020    9:28 PM 03/21/2020   12:27 AM 03/04/2020   11:13 AM 12/25/2017    3:41 PM 11/24/2017    5:50 AM  Advanced Directives  Does Patient Have a Medical Advance Directive? No No No No No Yes No  Type of Holiday representative of Healthcare Power of Attorney in Chart?      No - copy requested   Would patient like information on creating a medical advance directive? No - Patient declined No - Patient declined No - Patient declined No - Patient declined No - Patient declined  No - Patient declined    Current Medications (verified) Outpatient Encounter Medications as of 07/04/2023  Medication Sig   amLODipine (NORVASC) 10 MG tablet Take 1 tablet (10 mg total) by mouth at bedtime.   chlorthalidone (HYGROTON) 25 MG tablet Take 0.5 tablets (12.5 mg total) by mouth daily.   Cholecalciferol (VITAMIN D3) 1.25 MG (50000 UT) CAPS Take 1 weekly for 12 weeks   Evolocumab  (REPATHA SURECLICK) 140 MG/ML SOAJ Repatha SureClick 140 mg/mL subcutaneous pen injector  INJECT 1 PEN INTO THE SKIN EVERY 14 (FOURTEEN) DAYS.   hydrALAZINE (APRESOLINE) 25 MG tablet Take 1 tablet (25 mg total) by mouth 3 (three) times daily.   lisinopril (ZESTRIL) 40 MG tablet TAKE 1.5 TABLETS (60 MG TOTAL) BY MOUTH EVERY MORNING.   XARELTO 20 MG TABS tablet TAKE 1 TABLET(20 MG) BY MOUTH DAILY WITH SUPPER   No facility-administered encounter medications on file as of 07/04/2023.    Allergies (verified) Patient has no known allergies.   History: Past Medical History:  Diagnosis Date   Bilateral leg pain 07/02/2016   Cancer (HCC) 08/03/2010   colon   DVT (deep venous thrombosis) (HCC)    recurrent; on long term anticoag (Rivaroxaban)   History of colon cancer, stage I 08/17/2015   HLD (hyperlipidemia)    Hypertension    Prediabetes 05/25/2019   Uterine fibroid    Varicose 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 08/26/2014   Past Surgical History:  Procedure Laterality Date   ABDOMINAL HYSTERECTOMY     CHOLECYSTECTOMY     COLON SURGERY     GALLBLADDER SURGERY     MEDIASTERNOTOMY N/A 11/24/2017   Procedure: PARTIAL STERNOTOMY;  Surgeon: Loreli Slot, MD;  Location: [facility] OR;  Service: Thoracic;  Laterality: N/A;   THYMECTOMY  11/24/2017   Procedure: THYMECTOMY;  Surgeon: Loreli Slot, MD;  Location:  [facility] OR;  Service: Thoracic;;   Family History  Problem Relation Age of Onset   Heart attack Mother    Hypertension Father    Social History   Socioeconomic History   Marital status: Married    Spouse name: Not on file   Number of children: 1   Years of education: Bachelors   Highest education level: Not on file  Occupational History   Not on file  Tobacco Use   Smoking status: Never   Smokeless tobacco: Never  Vaping Use   Vaping status: Never Used  Substance and Sexual Activity   Alcohol use: No    Alcohol/week: 0.0 standard drinks of alcohol   Drug  use: No   Sexual activity: Not on file  Other Topics Concern   Not on file  Social History Narrative   Lives at home with husband and son.   Right-handed.   No caffeine use.   Social Determinants of Health   Financial Resource Strain: High Risk (07/04/2023)   Overall Financial Resource Strain (CARDIA)    Difficulty of Paying Living Expenses: Hard  Food Insecurity: Food Insecurity Present (07/04/2023)   Hunger Vital Sign    Worried About Running Out of Food in the Last Year: Sometimes true    Ran Out of Food in the Last Year: Never true  Transportation Needs: No Transportation Needs (07/04/2023)   PRAPARE - Administrator, Civil Service (Medical): No    Lack of Transportation (Non-Medical): No  Physical Activity: Sufficiently Active (07/04/2023)   Exercise Vital Sign    Days of Exercise per Week: 7 days    Minutes of Exercise per Session: 30 min  Stress: No Stress Concern Present (07/04/2023)   [employer] of Occupational Health - Occupational Stress Questionnaire    Feeling of Stress : Not at all  Social Connections: Moderately Isolated (07/04/2023)   Social Connection and Isolation Panel [NHANES]    Frequency of Communication with Friends and Family: Once a week    Frequency of Social Gatherings with Friends and Family: Once a week    Attends Religious Services: More than 4 times per year    Active Member of [employer] or Organizations: No    Attends Engineer, structural: Never    Marital Status: Married    Tobacco Counseling Counseling given: Not Answered   Clinical Intake:  Pre-visit preparation completed: Yes  Pain : No/denies pain  Nutritional Risks: None Diabetes: No  How often do you need to have someone help you when you read instructions, pamphlets, or other written materials from your doctor or pharmacy?: 1 - Never  Interpreter Needed?: No  Information entered by :: [employer], CMA   Activities of Daily Living    07/04/2023     1:45 PM  In your present state of health, do you have any difficulty performing the following activities:  Hearing? 0  Vision? 0  Difficulty concentrating or making decisions? 0  Walking or climbing stairs? 0  Dressing or bathing? 0  Doing errands, shopping? 0  Preparing Food and eating ? N  Using the Toilet? N  In the past six months, have you accidently leaked urine? N  Do you have problems with loss of bowel control? N  Managing your Medications? N  Managing your Finances? N  Housekeeping or managing your Housekeeping? N    Patient Care Team: Copland, Gwenlyn Found, MD as PCP - General (Family Medicine) Jens Som Madolyn Frieze, MD as PCP - Cardiology (  Cardiology) Lenda Kelp, MD as Consulting Physician (Sports Medicine)  Indicate any recent Medical Services you may have received from other than [facility] providers in the past year (date may be approximate).     Assessment:   This is a routine wellness examination for [name].  Hearing/Vision screen No results found.   Goals Addressed   None    Depression Screen    07/04/2023    1:55 PM 01/13/2023   10:35 AM 04/17/2022    2:37 PM 04/03/2022    3:05 PM 10/30/2020   10:25 AM 07/18/2016    4:26 PM 06/03/2016    4:52 PM  PHQ 2/9 Scores  PHQ - 2 Score 0 0 0 0 0 0 0    Fall Risk    07/04/2023    1:48 PM 01/13/2023   10:35 AM 04/17/2022    2:37 PM 04/03/2022    3:04 PM 10/30/2020   10:21 AM  Fall Risk   Falls in the past year? 0 0 0 0 0  Number falls in past yr: 0 0 0 0 0  Injury with Fall? 0 0 0 0 0  Risk for fall due to : No Fall Risks No Fall Risks No Fall Risks No Fall Risks   Follow up Falls evaluation completed Falls evaluation completed Falls evaluation completed Falls evaluation completed     MEDICARE RISK AT HOME: Medicare Risk at Home Any stairs in or around the home?: Yes If so, are there any without handrails?: No Home free of loose throw rugs in walkways, pet beds, electrical cords, etc?: Yes Adequate lighting  in your home to reduce risk of falls?: Yes Life alert?: No Use of a cane, walker or w/c?: No Grab bars in the bathroom?: No Shower chair or bench in shower?: No Elevated toilet seat or a handicapped toilet?: No  TIMED UP AND GO:  Was the test performed?  No    Cognitive Function:        07/04/2023    1:55 PM 04/03/2022    3:13 PM  6CIT Screen  What Year? 0 points 0 points  What month? 0 points 0 points  What time? 0 points 0 points  Count back from 20 0 points 0 points  Months in reverse 0 points 0 points  Repeat phrase 0 points 0 points  Total Score 0 points 0 points    Immunizations Immunization History  Administered Date(s) Administered   Tdap 12/18/2016    TDAP status: Up to date  Flu Vaccine status: Declined, Education has been provided regarding the importance of this vaccine but patient still declined. Advised may receive this vaccine at local pharmacy or Health Dept. Aware to provide a copy of the vaccination record if obtained from local pharmacy or Health Dept. Verbalized acceptance and understanding.  Pneumococcal vaccine status: Due, Education has been provided regarding the importance of this vaccine. Advised may receive this vaccine at local pharmacy or Health Dept. Aware to provide a copy of the vaccination record if obtained from local pharmacy or Health Dept. Verbalized acceptance and understanding.  Covid-19 vaccine status: Information provided on how to obtain vaccines.   Qualifies for Shingles Vaccine? Yes   Zostavax completed No   Shingrix Completed?: No.    Education has been provided regarding the importance of this vaccine. Patient has been advised to call insurance company to determine out of pocket expense if they have not yet received this vaccine. Advised may also receive vaccine at local pharmacy or  Health Dept. Verbalized acceptance and understanding.  Screening Tests Health Maintenance  Topic Date Due   COVID-19 Vaccine (1) Never done    Zoster Vaccines- Shingrix (1 of 2) Never done   Pneumonia Vaccine 29+ Years old (1 of 1 - PCV) Never done   INFLUENZA VACCINE  Never done   Medicare Annual Wellness (AWV)  04/04/2023   MAMMOGRAM  03/18/2024   DTaP/Tdap/Td (2 - Td or Tdap) 12/19/2026   Colonoscopy  05/30/2027   DEXA SCAN  Completed   Hepatitis C Screening  Completed   HPV VACCINES  Aged Out    Health Maintenance  Health Maintenance Due  Topic Date Due   COVID-19 Vaccine (1) Never done   Zoster Vaccines- Shingrix (1 of 2) Never done   Pneumonia Vaccine 75+ Years old (1 of 1 - PCV) Never done   INFLUENZA VACCINE  Never done   Medicare Annual Wellness (AWV)  04/04/2023    Colorectal cancer screening: Type of screening: Colonoscopy. Completed 05/29/22. Repeat every 5 years  Mammogram status: Ordered 07/04/23. Pt provided with contact info and advised to call to schedule appt.   Bone Density status: Ordered 07/04/23. Pt provided with contact info and advised to call to schedule appt.  Lung Cancer Screening: (Low Dose CT Chest recommended if Age 69-80 years, 20 pack-year currently smoking OR have quit w/in 15years.) does not qualify.   Additional Screening:  Hepatitis C Screening: does qualify; Completed 04/07/20  Vision Screening: Recommended annual ophthalmology exams for early detection of glaucoma and other disorders of the eye. Is the patient up to date with their annual eye exam?  Yes  Who is the provider or what is the name of the office in which the patient attends annual eye exams? Can't remember name at this time If pt is not established with a provider, would they like to be referred to a provider to establish care? No .   Dental Screening: Recommended annual dental exams for proper oral hygiene  Diabetic Foot Exam: N/a  Community Resource Referral / Chronic Care Management: CRR required this visit?  No   CCM required this visit?  No     Plan:     I have personally reviewed and noted the  following in the patient's chart:   Medical and social history Use of alcohol, tobacco or illicit drugs  Current medications and supplements including opioid prescriptions. Patient is not currently taking opioid prescriptions. Functional ability and status Nutritional status Physical activity Advanced directives List of other physicians Hospitalizations, surgeries, and ER visits in previous 12 months Vitals Screenings to include cognitive, depression, and falls Referrals and appointments  In addition, I have reviewed and discussed with patient certain preventive protocols, quality metrics, and best practice recommendations. A written personalized care plan for preventive services as well as general preventive health recommendations were provided to patient.     Donne Anon, CMA   07/04/2023   After Visit Summary: (MyChart) Due to this being a telephonic visit, the after visit summary with patients personalized plan was offered to patient via MyChart   Nurse Notes: None

## 2023-07-04 NOTE — Patient Instructions (Signed)
[name] , Thank you for taking time to come for your Medicare Wellness Visit. I appreciate your ongoing commitment to your health goals. Please review the following plan we discussed and let me know if I can assist you in the future.     This is a list of the screening recommended for you and due dates:  Health Maintenance  Topic Date Due   COVID-19 Vaccine (1) Never done   Zoster (Shingles) Vaccine (1 of 2) Never done   Pneumonia Vaccine (1 of 1 - PCV) Never done   Flu Shot  Never done   Mammogram  03/18/2024   Medicare Annual Wellness Visit  07/03/2024   DTaP/Tdap/Td vaccine (2 - Td or Tdap) 12/19/2026   Colon Cancer Screening  05/30/2027   DEXA scan (bone density measurement)  Completed   Hepatitis C Screening  Completed   HPV Vaccine  Aged Out    Next appointment: Follow up in one year for your annual wellness visit.   Preventive Care 69 Years and Older, Female Preventive care refers to lifestyle choices and visits with your health care provider that can promote health and wellness. What does preventive care include? A yearly physical exam. This is also called an annual well check. Dental exams once or twice a year. Routine eye exams. Ask your health care provider how often you should have your eyes checked. Personal lifestyle choices, including: Daily care of your teeth and gums. Regular physical activity. Eating a healthy diet. Avoiding tobacco and drug use. Limiting alcohol use. Practicing safe sex. Taking low-dose aspirin every day. Taking vitamin and mineral supplements as recommended by your health care provider. What happens during an annual well check? The services and screenings done by your health care provider during your annual well check will depend on your age, overall health, lifestyle risk factors, and family history of disease. Counseling  Your health care provider may ask you questions about your: Alcohol use. Tobacco use. Drug  use. Emotional well-being. Home and relationship well-being. Sexual activity. Eating habits. History of falls. Memory and ability to understand (cognition). Work and work Astronomer. Reproductive health. Screening  You may have the following tests or measurements: Height, weight, and BMI. Blood pressure. Lipid and cholesterol levels. These may be checked every 5 years, or more frequently if you are over 78 years old. Skin check. Lung cancer screening. You may have this screening every year starting at age 72 if you have a 30-pack-year history of smoking and currently smoke or have quit within the past 15 years. Fecal occult blood test (FOBT) of the stool. You may have this test every year starting at age 41. Flexible sigmoidoscopy or colonoscopy. You may have a sigmoidoscopy every 5 years or a colonoscopy every 10 years starting at age 65. Hepatitis C blood test. Hepatitis B blood test. Sexually transmitted disease (STD) testing. Diabetes screening. This is done by checking your blood sugar (glucose) after you have not eaten for a while (fasting). You may have this done every 1-3 years. Bone density scan. This is done to screen for osteoporosis. You may have this done starting at age 87. Mammogram. This may be done every 1-2 years. Talk to your health care provider about how often you should have regular mammograms. Talk with your health care provider about your test results, treatment options, and if necessary, the need for more tests. Vaccines  Your health care provider may recommend certain vaccines, such as: Influenza vaccine. This is recommended every year. Tetanus,  diphtheria, and acellular pertussis (Tdap, Td) vaccine. You may need a Td booster every 10 years. Zoster vaccine. You may need this after age 59. Pneumococcal 13-valent conjugate (PCV13) vaccine. One dose is recommended after age 60. Pneumococcal polysaccharide (PPSV23) vaccine. One dose is recommended after age  5. Talk to your health care provider about which screenings and vaccines you need and how often you need them. This information is not intended to replace advice given to you by your health care provider. Make sure you discuss any questions you have with your health care provider. Document Released: 08/25/2015 Document Revised: 04/17/2016 Document Reviewed: 05/30/2015 Elsevier Interactive Patient Education  2017 [employer].  Fall Prevention in the Home Falls can cause injuries. They can happen to people of all ages. There are many things you can do to make your home safe and to help prevent falls. What can I do on the outside of my home? Regularly fix the edges of walkways and driveways and fix any cracks. Remove anything that might make you trip as you walk through a door, such as a raised step or threshold. Trim any bushes or trees on the path to your home. Use bright outdoor lighting. Clear any walking paths of anything that might make someone trip, such as rocks or tools. Regularly check to see if handrails are loose or broken. Make sure that both sides of any steps have handrails. Any raised decks and porches should have guardrails on the edges. Have any leaves, snow, or ice cleared regularly. Use sand or salt on walking paths during winter. Clean up any spills in your garage right away. This includes oil or grease spills. What can I do in the bathroom? Use night lights. Install grab bars by the toilet and in the tub and shower. Do not use towel bars as grab bars. Use non-skid mats or decals in the tub or shower. If you need to sit down in the shower, use a plastic, non-slip stool. Keep the floor dry. Clean up any water that spills on the floor as soon as it happens. Remove soap buildup in the tub or shower regularly. Attach bath mats securely with double-sided non-slip rug tape. Do not have throw rugs and other things on the floor that can make you trip. What can I do in the  bedroom? Use night lights. Make sure that you have a light by your bed that is easy to reach. Do not use any sheets or blankets that are too big for your bed. They should not hang down onto the floor. Have a firm chair that has side arms. You can use this for support while you get dressed. Do not have throw rugs and other things on the floor that can make you trip. What can I do in the kitchen? Clean up any spills right away. Avoid walking on wet floors. Keep items that you use a lot in easy-to-reach places. If you need to reach something above you, use a strong step stool that has a grab bar. Keep electrical cords out of the way. Do not use floor polish or wax that makes floors slippery. If you must use wax, use non-skid floor wax. Do not have throw rugs and other things on the floor that can make you trip. What can I do with my stairs? Do not leave any items on the stairs. Make sure that there are handrails on both sides of the stairs and use them. Fix handrails that are broken or loose. Make  sure that handrails are as long as the stairways. Check any carpeting to make sure that it is firmly attached to the stairs. Fix any carpet that is loose or worn. Avoid having throw rugs at the top or bottom of the stairs. If you do have throw rugs, attach them to the floor with carpet tape. Make sure that you have a light switch at the top of the stairs and the bottom of the stairs. If you do not have them, ask someone to add them for you. What else can I do to help prevent falls? Wear shoes that: Do not have high heels. Have rubber bottoms. Are comfortable and fit you well. Are closed at the toe. Do not wear sandals. If you use a stepladder: Make sure that it is fully opened. Do not climb a closed stepladder. Make sure that both sides of the stepladder are locked into place. Ask someone to hold it for you, if possible. Clearly mark and make sure that you can see: Any grab bars or  handrails. First and last steps. Where the edge of each step is. Use tools that help you move around (mobility aids) if they are needed. These include: Canes. Walkers. Scooters. Crutches. Turn on the lights when you go into a dark area. Replace any light bulbs as soon as they burn out. Set up your furniture so you have a clear path. Avoid moving your furniture around. If any of your floors are uneven, fix them. If there are any pets around you, be aware of where they are. Review your medicines with your doctor. Some medicines can make you feel dizzy. This can increase your chance of falling. Ask your doctor what other things that you can do to help prevent falls. This information is not intended to replace advice given to you by your health care provider. Make sure you discuss any questions you have with your health care provider. Document Released: 05/25/2009 Document Revised: 01/04/2016 Document Reviewed: 09/02/2014 Elsevier Interactive Patient Education  2017 [employer].

## 2023-07-08 NOTE — Progress Notes (Addendum)
[facility] [address] [place], [place] [address] [phone]  Date:  07/09/2023   Name:  [name]   DOB:  August 02, 1956   MRN:  [id number]  PCP:  Pearline Cables, MD    Chief Complaint: Hypertension (She has seen numbers as high as 200/100. Pain in the arms and neck. She has not had Xarelto in about 2 months. )   History of Present Illness:  [name] is a 67 y.o. very pleasant female patient who presents with the following:  Patient seen today with concern about her blood pressure- She has history of colon cancer, recurrent DVT on xarelto, hypertension, prediabetes, stage I thymoma status post thymectomy 2019  Most recent visit with myself was in June-at that time she also had concern of labile blood pressures, she noted that her systolic pressure would get elevated in the afternoon to as high as 180 or 190. However, on our exam her blood pressure was 122/60. I suggested altering her blood pressure medication schedule-take one of her drugs in the afternoon instead of all in the morning  She was seen by cardiology at the end of August, Dr. Jens Som to discuss hypertension and also occasional chest pain. Dr. Jens Som noted her blood pressure was high in the office, 155/77.  He added hydralazine 25 3 times daily.  EKG was reassuring  Current medications- Amlodipine 10 Chlorthalidone 12.5 daily Hydralazine 25 3 times daily Lisinopril 60 mg daily Repatha Xarelto- she has not taken in 2 months due to cost.  She got a savings card but she is on medicare so it does not work for her   Today pt notes her BP is running high again, typically will be elevated when she checks it in the afternoon.  Her systolic blood pressures may go up to 180 or so.  When her blood pressure is high it seems to cause chest pain She notes this past Saturday her BP was 200/110, and she noted CP and pain in her neck and left arm-  however this resolved after some period of time, she cannot really tell me specifically.  She is not having any chest pain or shortness of breath currently  BP Readings from Last 3 Encounters:  07/09/23 (!) 155/90  04/09/23 (!) 157/77  01/13/23 122/60     Patient Active Problem List   Diagnosis Date Noted   Stroke-like symptoms 03/21/2020   HLD (hyperlipidemia)    COVID-19 virus infection    Elevated liver enzymes    Ischemic stroke (HCC)    Statin myopathy 12/29/2019   Prediabetes 05/25/2019   S/P thymectomy 11/24/2017   Bilateral leg pain 07/02/2016   History of colon cancer, stage I 08/17/2015   Varicose 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 08/26/2014   HTN (hypertension) 12/24/2013   Cancer of left colon (HCC) 02/25/2012   DVT (deep venous thrombosis) (HCC) 08/27/2011    Past Medical History:  Diagnosis Date   Bilateral leg pain 07/02/2016   Cancer (HCC) 08/03/2010   colon   DVT (deep venous thrombosis) (HCC)    recurrent; on long term anticoag (Rivaroxaban)   History of colon cancer, stage I 08/17/2015   HLD (hyperlipidemia)    Hypertension    Prediabetes 05/25/2019   Uterine fibroid    Varicose 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 08/26/2014    Past Surgical History:  Procedure Laterality Date   ABDOMINAL HYSTERECTOMY  CHOLECYSTECTOMY     COLON SURGERY     GALLBLADDER SURGERY     MEDIASTERNOTOMY N/A 11/24/2017   Procedure: PARTIAL STERNOTOMY;  Surgeon: Loreli Slot, MD;  Location: [facility] OR;  Service: Thoracic;  Laterality: N/A;   THYMECTOMY  11/24/2017   Procedure: THYMECTOMY;  Surgeon: Loreli Slot, MD;  Location: [facility] OR;  Service: Thoracic;;    Social History   Tobacco Use   Smoking status: Never   Smokeless tobacco: Never  Vaping Use   Vaping status: Never Used  Substance Use Topics   Alcohol use: No    Alcohol/week: 0.0 standard drinks of alcohol   Drug use: No    Family History  Problem Relation Age of Onset   Heart  attack Mother    Hypertension Father     No Known Allergies  Medication list has been reviewed and updated.  Current Outpatient Medications on File Prior to Visit  Medication Sig Dispense Refill   amLODipine (NORVASC) 10 MG tablet Take 1 tablet (10 mg total) by mouth at bedtime. 90 tablet 3   chlorthalidone (HYGROTON) 25 MG tablet Take 0.5 tablets (12.5 mg total) by mouth daily. 45 tablet 3   Cholecalciferol (VITAMIN D3) 1.25 MG (50000 UT) CAPS Take 1 weekly for 12 weeks 12 capsule 0   Evolocumab (REPATHA SURECLICK) 140 MG/ML SOAJ Repatha SureClick 140 mg/mL subcutaneous pen injector  INJECT 1 PEN INTO THE SKIN EVERY 14 (FOURTEEN) DAYS. 2 mL 11   hydrALAZINE (APRESOLINE) 25 MG tablet Take 1 tablet (25 mg total) by mouth 3 (three) times daily. 270 tablet 3   XARELTO 20 MG TABS tablet TAKE 1 TABLET(20 MG) BY MOUTH DAILY WITH SUPPER (Patient not taking: Reported on 07/09/2023) 90 tablet 3   No current facility-administered medications on file prior to visit.    Review of Systems:  As per HPI- otherwise negative.   Physical Examination: Vitals:   07/09/23 1315 07/09/23 1919  BP: (!) 162/90 (!) 155/90  Pulse: 62   Resp: 18   Temp: 98.2 F (36.8 C)   SpO2: 98%    Vitals:   07/09/23 1315  Weight: 186 lb (84.4 kg)  Height: 5\' 6"  (1.676 m)   Body mass index is 30.02 kg/m. Ideal Body Weight: Weight in (lb) to have BMI = 25: 154.6  GEN: no acute distress.  Mildly obese, looks well HEENT: Atraumatic, Normocephalic.  Ears and Nose: No external deformity. CV: RRR, No M/G/R. No JVD. No thrill. No extra heart sounds. PULM: CTA B, no wheezes, crackles, rhonchi. No retractions. No resp. distress. No accessory muscle use. ABD: S, NT, ND, +BS. No rebound. No HSM. EXTR: No c/c/e PSYCH: Normally interactive. Conversant.   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  She was seen by cardiology for this problem about 3 months ago, states she was not sure who she should contact to follow-up on her blood pressure.  Dr. Jens Som did add hydralazine at her last visit but unfortunately her blood pressure is still elevated  We decided to increase her dose of lisinopril  to 80 mg daily EKG is reassuring, I will check lab work including a troponin. I will reach out to her cardiologist for further advice  She also has stopped taking Xarelto due to cost issues.  I only had a 1 week sample of 15 mg, we will have her take 15 mg once a day while we work on figuring out prescription assistance for her  Signed Abbe Amsterdam, MD Received labs as below, message to patient  Results for orders placed or performed in visit on 07/09/23  Troponin I  Result Value Ref Range   Troponin I 4 < OR = 47 ng/L   Addnd 11/29 Received the rest of her labs 11/29- and a message back from Dr Jens Som who recommends take lisinopril to 40 and increase hydralazine to 75 TID We may try increasing her hydralazine to 50 for a few days and then go up to 75- called pt and gave his this info, I will check back with her and follow-up regarding her BP   Results for orders placed or performed in visit on 07/09/23  Basic metabolic panel  Result Value Ref Range   Glucose, Bld 110 (H) 65 - 99 mg/dL   BUN 18 7 - 25 mg/dL   Creat 8.29 5.62 - 1.30 mg/dL   BUN/Creatinine Ratio SEE NOTE: 6 - 22 (calc)   Sodium 142 135 - 146 mmol/L   Potassium 4.8 3.5 - 5.3 mmol/L   Chloride 105 98 - 110 mmol/L   CO2 29 20 - 32 mmol/L   Calcium 10.1 8.6 - 10.4 mg/dL  CBC  Result Value Ref Range   WBC  4.5 3.8 - 10.8 Thousand/uL   RBC 4.53 3.80 - 5.10 Million/uL   Hemoglobin 14.2 11.7 - 15.5 g/dL   HCT 86.5 78.4 - 69.6 %   MCV 94.7 80.0 - 100.0 fL   MCH 31.3 27.0 - 33.0 pg   MCHC 33.1 32.0 - 36.0 g/dL   RDW 29.5 28.4 - 13.2 %   Platelets 181 140 - 400 Thousand/uL   MPV 12.1 7.5 - 12.5 fL  Troponin I  Result Value Ref Range   Troponin I 4 < OR = 47 ng/L

## 2023-07-09 ENCOUNTER — Ambulatory Visit: Payer: Medicare Other | Admitting: Family Medicine

## 2023-07-09 ENCOUNTER — Encounter: Payer: Self-pay | Admitting: Family Medicine

## 2023-07-09 ENCOUNTER — Telehealth (HOSPITAL_BASED_OUTPATIENT_CLINIC_OR_DEPARTMENT_OTHER): Payer: Self-pay

## 2023-07-09 VITALS — BP 155/90 | HR 62 | Temp 98.2°F | Resp 18 | Ht 66.0 in | Wt 186.0 lb

## 2023-07-09 DIAGNOSIS — R0989 Other specified symptoms and signs involving the circulatory and respiratory systems: Secondary | ICD-10-CM

## 2023-07-09 DIAGNOSIS — G8929 Other chronic pain: Secondary | ICD-10-CM | POA: Diagnosis not present

## 2023-07-09 DIAGNOSIS — I825Z9 Chronic embolism and thrombosis of unspecified deep veins of unspecified distal lower extremity: Secondary | ICD-10-CM | POA: Diagnosis not present

## 2023-07-09 DIAGNOSIS — R079 Chest pain, unspecified: Secondary | ICD-10-CM

## 2023-07-09 DIAGNOSIS — I10 Essential (primary) hypertension: Secondary | ICD-10-CM | POA: Diagnosis not present

## 2023-07-09 LAB — TROPONIN I: Troponin I: 4 ng/L (ref <=47)

## 2023-07-09 MED ORDER — LISINOPRIL 40 MG PO TABS
ORAL_TABLET | ORAL | 3 refills | Status: DC
Start: 2023-07-09 — End: 2023-07-11

## 2023-07-09 NOTE — Patient Instructions (Signed)
Let's try increasing your lisinopril to 80 mg total daily I gave you a sample of xarelto 15 mg- I know this is not your dose but this is what I had in samples!  Please take this once a day while we work on getting you a source of xarelto that you can afford  I will get in touch with Dr Jens Som and ask him to bring you in for a recheck visit  Let me know if anything is changing or getting worse in the meantime

## 2023-07-10 LAB — BASIC METABOLIC PANEL
BUN: 18 mg/dL (ref 7–25)
CO2: 29 mmol/L (ref 20–32)
Calcium: 10.1 mg/dL (ref 8.6–10.4)
Chloride: 105 mmol/L (ref 98–110)
Creat: 1.01 mg/dL (ref 0.50–1.05)
Glucose, Bld: 110 mg/dL — ABNORMAL HIGH (ref 65–99)
Potassium: 4.8 mmol/L (ref 3.5–5.3)
Sodium: 142 mmol/L (ref 135–146)

## 2023-07-10 LAB — CBC
HCT: 42.9 % (ref 35.0–45.0)
Hemoglobin: 14.2 g/dL (ref 11.7–15.5)
MCH: 31.3 pg (ref 27.0–33.0)
MCHC: 33.1 g/dL (ref 32.0–36.0)
MCV: 94.7 fL (ref 80.0–100.0)
MPV: 12.1 fL (ref 7.5–12.5)
Platelets: 181 10*3/uL (ref 140–400)
RBC: 4.53 10*6/uL (ref 3.80–5.10)
RDW: 12.2 % (ref 11.0–15.0)
WBC: 4.5 10*3/uL (ref 3.8–10.8)

## 2023-07-11 MED ORDER — HYDRALAZINE HCL 50 MG PO TABS
50.0000 mg | ORAL_TABLET | Freq: Three times a day (TID) | ORAL | 3 refills | Status: DC
Start: 2023-07-11 — End: 2023-10-06

## 2023-07-11 NOTE — Addendum Note (Signed)
Addended by: Abbe Amsterdam C on: 07/11/2023 09:53 AM   Modules accepted: Orders

## 2023-07-17 ENCOUNTER — Telehealth: Payer: Self-pay | Admitting: Pharmacist

## 2023-07-17 NOTE — Telephone Encounter (Signed)
-----   Message from [place] Copland sent at 07/09/2023  7:27 PM EST ----- Hi Lucrecia Mcphearson, this is the patient who I mentioned cannot afford her Xarelto.  Could you please take a look at her case and see if we have anything to offer her?

## 2023-07-17 NOTE — Telephone Encounter (Signed)
Attempt was made to contact patient by phone today by Clinical Pharmacist regarding medication access.  Unable to reach patient. LM on VM with my contact number [phone].  Will forward to scheduler to continue to out reach patient.

## 2023-07-18 ENCOUNTER — Telehealth: Payer: Self-pay

## 2023-07-18 NOTE — Progress Notes (Signed)
   Care Guide Note  07/18/2023 Name: [name] MRN: [id number] DOB: Apr 24, 1956  Referred by: Pearline Cables, MD Reason for referral : Care Coordination (Outreach to schedule with Pharm d )   [name] is a 67 y.o. year old female who is a primary care patient of Copland, Gwenlyn Found, MD. [place] Levo Krus was referred to the pharmacist for assistance related to HTN.    Successful contact was made with the patient to discuss pharmacy services including being ready for the pharmacist to call at least 5 minutes before the scheduled appointment time, to have medication bottles and any blood sugar or blood pressure readings ready for review. The patient agreed to meet with the pharmacist via with the pharmacist via telephone visit on (date/time).  07/23/2023  Penne Lash , RMA     [place]  [facility], [facility] Guide  Direct Dial: [phone]  Website: [email or web address]

## 2023-07-23 ENCOUNTER — Ambulatory Visit (INDEPENDENT_AMBULATORY_CARE_PROVIDER_SITE_OTHER): Payer: Medicare Other | Admitting: Pharmacist

## 2023-07-23 DIAGNOSIS — I825Z9 Chronic embolism and thrombosis of unspecified deep veins of unspecified distal lower extremity: Secondary | ICD-10-CM

## 2023-07-23 NOTE — Progress Notes (Unsigned)
Cardiology Office Note    Date:  07/24/2023  ID:  [name], DOB [birth year], MRN [id number] PCP:  Pearline Cables, MD  Cardiologist:  Olga Millers, MD  Electrophysiologist:  None   Chief Complaint: Hypertension   History of Present Illness: .    [name] is a 67 y.o. female with visit-pertinent history of hypertension, recurrent DVT on Xarelto, hyperlipidemia previous mediastinal mass status post thymectomy.  Echocardiogram in August 2021 which showed normal LV function with grade 1 diastolic dysfunction.  Noted venous Dopplers August 2021 showed chronic DVT involving right peroneal vein and chronic DVT left peroneal vein.  MRI August 2021 showed no high-grade narrowing, dissection or aneurysm within the neck.  Cardiac CTA in February 2023 showed calcium score of 0 and no coronary artery disease.   She was last seen by Dr. Jens Som on 04/09/2023 she continued to have occasional chest pain that can occur either with activity or at rest that was unchanged, she denied dyspnea or syncope.  Noted that her blood pressure continued to run high at home.  She was started on hydralazine 25 mg three times a day.  She saw her PCP on 07/09/2023 for elevated blood pressure.  Patient noted that her blood pressure at the previous Saturday had been up to 200 systolic.  It was recommended that her hydralazine be increased to 50 mg for 3 times a day and then go up to 75 3 times a day.  Today she presents today regarding her blood pressure.  She reports that her blood pressure has been elevated up to the 190s to 200s systolic at night, notes she has some chest discomfort and headache with this.  She denies chest pain with exertion or when her blood pressure is not elevated.  She denies shortness of breath.  Notes she has some slight lower extremity edema as the day progresses but this resolves overnight.  She has been working to increase her exercise, denies chest pain or shortness of breath  with this.  She has also been working to improve her diet and decrease her salt intake.  Labwork independently reviewed: 07/09/2023: Sodium 142, potassium 4.8, creatinine 1.01, hemoglobin 14.2, hematocrit 42.9  ROS: .   Today she denies shortness of breath, lower extremity edema, fatigue, palpitations, melena, hematuria, hemoptysis, diaphoresis, weakness, presyncope, syncope, orthopnea, and PND.  All other systems are reviewed and otherwise negative. Studies Reviewed: Marland Kitchen    EKG:  EKG is not ordered today.   CV Studies:  Cardiac Studies & Procedures      ECHOCARDIOGRAM  ECHOCARDIOGRAM COMPLETE 03/22/2020  Narrative ECHOCARDIOGRAM REPORT    Patient Name:   [name] Date of Exam: 03/22/2020 Medical Rec #:  [id number]         Height:       67.0 in Accession #:    [id number]        Weight:       191.1 lb Date of Birth:  [birth year]         BSA:          1.984 m Patient Age:    64 years          BP:           127/76 mmHg Patient Gender: F                 HR:           57 bpm. Exam Location:  Inpatient  Procedure: 2D Echo, Cardiac Doppler  and Color Doppler  Indications:    Stroke 434.91  History:        Patient has no prior history of Echocardiogram examinations. Risk Factors:Hypertension, Dyslipidemia and Non-Smoker.  Sonographer:    Renella Cunas RDCS Referring Phys: [id number] Kasandra Knudsen PAHWANI  IMPRESSIONS   1. Left ventricular ejection fraction, by estimation, is 55 to 60%. The left ventricle has normal function. The left ventricle has no regional wall motion abnormalities. Left ventricular diastolic parameters are consistent with Grade I diastolic dysfunction (impaired relaxation). 2. Right ventricular systolic function is normal. The right ventricular size is normal. Tricuspid regurgitation signal is inadequate for assessing PA pressure. 3. The mitral valve is normal in structure. No evidence of mitral valve regurgitation. No evidence of mitral stenosis. 4. The aortic  valve is tricuspid. Aortic valve regurgitation is not visualized. No aortic stenosis is present. 5. The inferior vena cava is normal in size with greater than 50% respiratory variability, suggesting right atrial pressure of 3 mmHg.  FINDINGS Left Ventricle: Left ventricular ejection fraction, by estimation, is 55 to 60%. The left ventricle has normal function. The left ventricle has no regional wall motion abnormalities. The left ventricular internal cavity size was normal in size. There is no left ventricular hypertrophy. Left ventricular diastolic parameters are consistent with Grade I diastolic dysfunction (impaired relaxation).  Right Ventricle: The right ventricular size is normal. No increase in right ventricular wall thickness. Right ventricular systolic function is normal. Tricuspid regurgitation signal is inadequate for assessing PA pressure.  Left Atrium: Left atrial size was normal in size.  Right Atrium: Right atrial size was normal in size.  Pericardium: There is no evidence of pericardial effusion.  Mitral Valve: The mitral valve is normal in structure. No evidence of mitral valve regurgitation. No evidence of mitral valve stenosis.  Tricuspid Valve: The tricuspid valve is normal in structure. Tricuspid valve regurgitation is not demonstrated.  Aortic Valve: The aortic valve is tricuspid. Aortic valve regurgitation is not visualized. No aortic stenosis is present.  Pulmonic Valve: The pulmonic valve was normal in structure. Pulmonic valve regurgitation is not visualized.  Aorta: The aortic root is normal in size and structure.  Venous: The inferior vena cava is normal in size with greater than 50% respiratory variability, suggesting right atrial pressure of 3 mmHg.  IAS/Shunts: No atrial level shunt detected by color flow Doppler.   LEFT VENTRICLE PLAX 2D LVIDd:         4.60 cm      Diastology LVIDs:         3.70 cm      LV e' lateral:   6.66 cm/s LV PW:         0.80  cm      LV E/e' lateral: 11.4 LV IVS:        0.80 cm      LV e' medial:    5.63 cm/s LVOT diam:     2.10 cm      LV E/e' medial:  13.5 LV SV:         70 LV SV Index:   35 LVOT Area:     3.46 cm  LV Volumes (MOD) LV vol d, MOD A2C: 110.0 ml LV vol d, MOD A4C: 110.0 ml LV vol s, MOD A2C: 46.7 ml LV vol s, MOD A4C: 44.3 ml LV SV MOD A2C:     63.3 ml LV SV MOD A4C:     110.0 ml LV SV MOD BP:  64.5 ml  RIGHT VENTRICLE RV S prime:     11.50 cm/s TAPSE (M-mode): 1.9 cm  LEFT ATRIUM             Index       RIGHT ATRIUM          Index LA diam:        3.70 cm 1.87 cm/m  RA Area:     9.16 cm LA Vol (A2C):   28.4 ml 14.32 ml/m RA Volume:   16.50 ml 8.32 ml/m LA Vol (A4C):   25.3 ml 12.75 ml/m LA Biplane Vol: 29.1 ml 14.67 ml/m AORTIC VALVE LVOT Vmax:   90.10 cm/s LVOT Vmean:  61.000 cm/s LVOT VTI:    0.201 m  AORTA Ao Root diam: 3.20 cm  MITRAL VALVE MV Area (PHT): 4.06 cm    SHUNTS MV Decel Time: 187 msec    Systemic VTI:  0.20 m MV E velocity: 75.80 cm/s  Systemic Diam: 2.10 cm MV A velocity: 75.80 cm/s MV E/A ratio:  1.00  Marca Ancona MD Electronically signed by Marca Ancona MD Signature Date/Time: 03/22/2020/4:51:53 PM    Final    CT SCANS  CT CORONARY MORPH W/CTA COR W/SCORE 09/13/2021  Addendum 09/13/2021  9:19 AM ADDENDUM REPORT: 09/13/2021 09:16  CLINICAL DATA:  67 yo female with chest pain  EXAM: Cardiac/Coronary CTA  TECHNIQUE: A non-contrast, gated CT scan was obtained with axial slices of 3 mm through the heart for calcium scoring. Calcium scoring was performed using the Agatston method. A 120 kV prospective, gated, contrast cardiac scan was obtained. Gantry rotation speed was 250 msecs and collimation was 0.6 mm. Two sublingual nitroglycerin tablets (0.8 mg) were given. The 3D data set was reconstructed in 5% intervals of the 35-75% of the R-R cycle. Diastolic phases were analyzed on a dedicated workstation using MPR, MIP, and VRT modes.  The patient received 95 cc of contrast.  FINDINGS: Image quality: Average.  Noise artifact is: Limited.  Coronary Arteries:  Normal coronary origin.  Right dominance.  Left main: The left main is a large caliber vessel with a normal take off from the left coronary cusp that bifurcates to form a left anterior descending artery and a left circumflex artery. There is no plaque or stenosis.  Left anterior descending artery: The LAD is patent without evidence of plaque or stenosis. The LAD gives off large patent D1 and D2; small patent D3.  Left circumflex artery: The LCX is non-dominant and patent with no evidence of plaque or stenosis. The LCX gives off patent obtuse marginal branch and terminates into 2 smaller posterolateral branches.  Right coronary artery: The RCA is dominant with normal take off from the right coronary cusp. There is no evidence of plaque or stenosis. The RCA terminates as a PDA and small right posterolateral branch without evidence of plaque or stenosis.  Right Atrium: Right atrial size is within normal limits.  Right Ventricle: The right ventricular cavity is within normal limits.  Left Atrium: Left atrial size is normal in size with no left atrial appendage filling defect.  Left Ventricle: The ventricular cavity size is within normal limits. There are no stigmata of prior infarction. There is no abnormal filling defect.  Pulmonary arteries: Normal in size without proximal filling defect.  Pulmonary veins: Normal pulmonary venous drainage.  Pericardium: Normal thickness with no significant effusion or calcium present.  Cardiac valves: The aortic valve is trileaflet without significant calcification. The mitral valve is normal structure without  significant calcification.  Aorta: Normal caliber with no significant disease.  Extra-cardiac findings: See attached radiology report for non-cardiac structures.  IMPRESSION: 1. Coronary calcium  score of 0. This was 0 percentile for age-, sex, and race-matched controls.  2. Normal coronary origin with right dominance.  3. No evidence of CAD.  4. Mild aortic atherosclerosis.  RECOMMENDATIONS: CAD-RADS 0: No evidence of CAD (0%). Consider non-atherosclerotic causes of chest pain.  Olga Millers, MD   Electronically Signed By: Olga Millers M.D. On: 09/13/2021 09:16  Narrative EXAM: OVER-READ INTERPRETATION  CT CHEST  The following report is an over-read performed by radiologist Dr. Charlett Nose of [facility] on 09/13/2021. This over-read does not include interpretation of cardiac or coronary anatomy or pathology. The coronary CTA interpretation by the cardiologist is attached.  COMPARISON:  None.  FINDINGS: Vascular: Heart is normal size.  Aorta normal caliber.  Mediastinum/Nodes: No adenopathy.  Small hiatal hernia.  Lungs/Pleura: No confluent opacities or effusions.  Upper Abdomen: Imaging into the upper abdomen demonstrates no acute findings.  Musculoskeletal: Chest wall soft tissues are unremarkable. No acute bony abnormality.  IMPRESSION: Small hiatal hernia.  No acute extra cardiac abnormality.  Electronically Signed: By: Charlett Nose M.D. On: 09/13/2021 08:48   CT SCANS  CT CORONARY MORPH W/CTA COR W/SCORE 11/11/2017  Addendum 11/11/2017  3:57 PM ADDENDUM REPORT: 11/11/2017 15:54  EXAM: OVER-READ INTERPRETATION  CT CHEST  The following report is an over-read performed by radiologist Dr. Schuyler Amor [facility] on 11/11/2017. This over-read does not include interpretation of cardiac or coronary anatomy or pathology. The coronary CTA interpretation by the cardiologist is attached.  COMPARISON:  CT chest 09/04/2017  FINDINGS: The anterior mediastinal mass is again noted measuring 3.5 by 2.5 cm, image 16/11. Previously this measured the same. The visualized portions of the trachea and lower airway are  patent. Unremarkable appearance of the esophagus. No adenopathy identified.  No pleural effusion. Pulmonary nodule in the left lower lobe measures 4 mm, image 30/12. No airspace consolidation or atelectasis.  No acute findings within the upper abdomen.  No focal bone abnormality.  IMPRESSION: 1. Stable appearance of anterior mediastinal mass. 2. 4 mm left lower lobe pulmonary nodule noted.   Electronically Signed By: Signa Kell M.D. 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  EXAM: Cardiac/Coronary  CT  TECHNIQUE: The patient was scanned on a [employer].  FINDINGS: A 120 kV prospective scan was triggered in the descending thoracic aorta at 111 HU's. Axial non-contrast 3 mm slices were carried out through the heart. The data set was analyzed on a dedicated work station and scored using the Agatson method. Gantry rotation speed was 250 msecs and collimation was .6 mm. No beta blockade and 0.8 mg of sl NTG was given. The 3D data set was reconstructed in 5% intervals of the 67-82 % of the R-R cycle. Diastolic phases were analyzed on a dedicated work station using MPR, MIP and VRT modes. The patient received 80 cc of contrast.  Aorta: Normal size. Mild calcifications in the aortic arch. Mild atherosclerotic plaque in the descending aorta. No dissection.  Aortic Valve:  Trileaflet.  No calcifications.  Coronary Arteries:  Normal coronary origin.  Right dominance.  RCA is a large dominant artery that gives rise to PDA and PLVB. There is no plaque.  Left main is a large artery that gives rise  to LAD and LCX arteries.  LAD is a large vessel that gives rise to two diagonal arteries. There is minimal non-calcified plaque in the proximal LAD.  LCX is a non-dominant artery that gives rise to one large OM1 branch. There is no plaque.  Other  findings:  Normal pulmonary vein drainage into the left atrium.  Normal let atrial appendage without a thrombus.  Normal size of the pulmonary artery.  IMPRESSION: 1. Coronary calcium score of 0. This was 0 percentile for age and sex matched control.  2. Normal coronary origin with right dominance.  3. The study is significantly affected by motion, however there is no significant plaque in any of the coronary arteries.  Electronically Signed: By: Tobias Alexander On: 11/11/2017 15:25            Current Reported Medications:.    Current Meds  Medication Sig   chlorthalidone (HYGROTON) 25 MG tablet Take 0.5 tablets (12.5 mg total) by mouth daily.   Cholecalciferol (VITAMIN D3) 1.25 MG (50000 UT) CAPS Take 1 weekly for 12 weeks   Evolocumab (REPATHA SURECLICK) 140 MG/ML SOAJ Repatha SureClick 140 mg/mL subcutaneous pen injector  INJECT 1 PEN INTO THE SKIN EVERY 14 (FOURTEEN) DAYS.   hydrALAZINE (APRESOLINE) 25 MG tablet Take 1 tablet (25 mg total) by mouth 3 (three) times daily.   hydrALAZINE (APRESOLINE) 50 MG tablet Take 1 tablet (50 mg total) by mouth 3 (three) times daily.   olmesartan (BENICAR) 40 MG tablet Take 1 tablet (40 mg total) by mouth daily.   pantoprazole (PROTONIX) 40 MG tablet Take 1 tablet by mouth daily.   [DISCONTINUED] lisinopril (ZESTRIL) 40 MG tablet TAKE 1 TABLETS (40 MG TOTAL) BY MOUTH EVERY MORNING.   Physical Exam:    VS:  BP (!) 158/84   Pulse 66   Ht 5' 7.72" (1.72 m)   Wt 185 lb (83.9 kg)   SpO2 99%   BMI 28.36 kg/m    Wt Readings from Last 3 Encounters:  07/24/23 185 lb (83.9 kg)  07/09/23 186 lb (84.4 kg)  04/09/23 188 lb (85.3 kg)    GEN: Well nourished, well developed in no acute distress NECK: No JVD; No carotid bruits CARDIAC: RRR, no murmurs, rubs, gallops RESPIRATORY:  Clear to auscultation without rales, wheezing or rhonchi  ABDOMEN: Soft, non-tender, non-distended EXTREMITIES:  No edema; No acute deformity   Asessement and  Plan:Marland Kitchen    Hypertension: Initial blood pressure today 170/80, on recheck was 158/84. Patient recently seen by her PCP for elevated blood pressure.  She reported that her blood pressure can be as high as systolic 200 with associated chest discomfort.  Dr. Jens Som previously recommended increasing her hydralazine to 50 three times daily then to 75 three times daily.  Patient does not have blood pressure log with her today.  Encouraged her to monitor her blood pressure at home for the next 2 weeks and bring a blood pressure log to follow-up. Reviewed ED precautions. Will refer her to Pharm.D. for hypertension.  Stop lisinopril and start olmesartan 40 mg daily, check BMET in 2 weeks. Continue hydralazine and chlorthalidone.   Hyperlipidemia: Last lipid profile on 01/13/2023 indicated total cholesterol 150, triglycerides 95, HDL 60.7 and LDL 71.  On Repatha.  Recurrent DVT: Previously on Xarelto, currently looking to switching to Eliquis for cost. Monitored and managed per PCP.     Disposition: F/u with Pharm D for HTN in two week. Follow up with Reather Littler, NP in six weeks.  Signed, Rip Harbour, NP

## 2023-07-23 NOTE — Progress Notes (Signed)
07/23/2023 Name: [name] MRN: [id number] DOB: 1955-10-12  Chief Complaint  Patient presents with   Medication Management    [name] is a 67 y.o. year old female who presented for a telephone visit.   They were referred to the pharmacist by their PCP for assistance in managing medication access.    Subjective:  Medication Access/Adherence  Patient has been prescribed Xarelto but has been taking inconsistently due to cost. She is in Medicare coverage gap and cost for 1 month supply of Xarelto is $150.  Patient is taking Xarelto for recurrent DVT.   Patient is also taking Repatha but per patient she pays $0 for Repatha. I suspect she might have a [employer] to help with cost of Repatha but patient was not sure   Patient reports affordability concerns with their medications: Yes  Patient reports access/transportation concerns to their pharmacy: No  Patient reports adherence concerns with their medications:  Yes     Current Pharmacy:  [id number] - [name], [place] - [address] [place] [address] Phone: [phone] Fax: [phone]  [id number] [name], [place] - [address] AT [facility] [address] [place] [place] [address] Phone: [phone] Fax: [phone]    Objective:  Lab Results  Component Value Date   HGBA1C 5.8 01/13/2023    Lab Results  Component Value Date   CREATININE 1.01 07/09/2023   BUN 18 07/09/2023   NA 142 07/09/2023   K 4.8 07/09/2023   CL 105 07/09/2023   CO2 29 07/09/2023    Lab Results  Component Value Date   CHOL 150 01/13/2023   HDL 60.70 01/13/2023   LDLCALC 71 01/13/2023   TRIG 95.0 01/13/2023   CHOLHDL 2 01/13/2023    Medications Reviewed Today     Reviewed by Henrene Pastor, RPH-CPP (Pharmacist) on 07/23/23 at 1420  Med List Status: <None>   Medication Order Taking? Sig Documenting Provider Last Dose Status  Informant    Discontinued 07/23/23 1420 (Change in therapy)   chlorthalidone (HYGROTON) 25 MG tablet [id number]  Take 0.5 tablets (12.5 mg total) by mouth daily. Copland, Gwenlyn Found, MD  Active   Cholecalciferol (VITAMIN D3) 1.25 MG (50000 UT) CAPS [id number]  Take 1 weekly for 12 weeks Copland, Gwenlyn Found, MD  Active   Evolocumab [facility] SURECLICK) 140 MG/ML Ivory Broad [id number] Yes Repatha SureClick 140 mg/mL subcutaneous pen injector  INJECT 1 PEN INTO THE SKIN EVERY 14 (FOURTEEN) DAYS. Lewayne Bunting, MD Taking Active   hydrALAZINE (APRESOLINE) 25 MG tablet [id number] Yes Take 1 tablet (25 mg total) by mouth 3 (three) times daily. Lewayne Bunting, MD Taking Active   hydrALAZINE (APRESOLINE) 50 MG tablet [id number] Yes Take 1 tablet (50 mg total) by mouth 3 (three) times daily. Copland, Gwenlyn Found, MD Taking Active   lisinopril (ZESTRIL) 40 MG tablet [id number] Yes TAKE 1 TABLETS (40 MG TOTAL) BY MOUTH EVERY MORNING. Copland, Gwenlyn Found, MD Taking Active   pantoprazole (PROTONIX) 40 MG tablet [id number] Yes Take 1 tablet by mouth daily. [provider]  Active   XARELTO 20 MG TABS tablet [id number] No TAKE 1 TABLET(20 MG) BY MOUTH DAILY WITH SUPPER  Patient not taking: Reported on 07/09/2023   Copland, Gwenlyn Found, MD Not Taking Active               Assessment/Plan:   Medication Management / Access:  - Assisted patient in applying  for Medicare Extra Help / LIS. It will take about 3 weeks for patient to get decision. She should expect a letter from Social Security to tell her if she was approved. If she is denied will work in medication assistance program.  - Provided information about [employer] program - $85/ 30 days or $240 / 90 days. Patient states this is also too high of cost.  - Our office has not been able to get any Xarelto samples - called, texted and emailed our assigned rep and I have not gotten a response.  - Will check with PCP about changing to Eliquis 2.5mg  twice a day  (secondary prophylaxis dose after 6 months of treatment for DVT or PE). Our office does has samples that we could provide for 1 month until her Medicare benefits reset for 2025 or she is approved for LIS   Follow Up Plan: 3 to 4 weeks  Henrene Pastor, PharmD Clinical Pharmacist [facility]   Addendum 07/25/2023 - Dr Patsy Lager approved change from Xarelto to Eliquis 2.5mg  twice a day.  LM on patient's VM that I will leave samples for her when I am in the office again 07/28/2023 Henrene Pastor, PharmD Clinical Pharmacist [facility]

## 2023-07-24 ENCOUNTER — Ambulatory Visit: Payer: Medicare Other | Attending: Cardiology | Admitting: Cardiology

## 2023-07-24 ENCOUNTER — Encounter: Payer: Self-pay | Admitting: Cardiology

## 2023-07-24 VITALS — BP 158/84 | HR 66 | Ht 67.72 in | Wt 185.0 lb

## 2023-07-24 DIAGNOSIS — R072 Precordial pain: Secondary | ICD-10-CM

## 2023-07-24 DIAGNOSIS — I10 Essential (primary) hypertension: Secondary | ICD-10-CM

## 2023-07-24 DIAGNOSIS — E785 Hyperlipidemia, unspecified: Secondary | ICD-10-CM | POA: Diagnosis not present

## 2023-07-24 DIAGNOSIS — Z86718 Personal history of other venous thrombosis and embolism: Secondary | ICD-10-CM

## 2023-07-24 MED ORDER — OLMESARTAN MEDOXOMIL 40 MG PO TABS: 40.0000 mg | ORAL_TABLET | Freq: Every day | ORAL | 0 refills | Status: DC

## 2023-07-24 NOTE — Patient Instructions (Addendum)
Medication Instructions:  Stop Lisinopril Start Olmesartan 40 mg once a day *If you need a refill on your cardiac medications before your next appointment, please call your pharmacy*  Lab Work: In 2 weeks we will draw a BMP If you have labs (blood work) drawn today and your tests are completely normal, you will receive your results only by: MyChart Message (if you have MyChart) OR A paper copy in the mail If you have any lab test that is abnormal or we need to change your treatment, we will call you to review the results.  Testing/Procedures: No testing  Follow-Up: At [facility], you and your health needs are our priority.  As part of our continuing mission to provide you with exceptional heart care, we have created designated Provider Care Teams.  These Care Teams include your primary Cardiologist (physician) and Advanced Practice Providers (APPs -  Physician Assistants and Nurse Practitioners) who all work together to provide you with the care you need, when you need it.  We recommend signing up for the patient portal called "MyChart".  Sign up information is provided on this After Visit Summary.  MyChart is used to connect with patients for Virtual Visits (Telemedicine).  Patients are able to view lab/test results, encounter notes, upcoming appointments, etc.  Non-urgent messages can be sent to your provider as well.   To learn more about what you can do with MyChart, go to [email or web address].    Your next appointment:   Post pharm D  Provider:   Reather Littler, NP  Other Instructions We would like for you to see Pharm D's hypertension clinic in 6-8 weeks after you get blood work done.  Keep blood pressure for the next 2 weeks and bring into to the Pharm D appointment.

## 2023-07-25 ENCOUNTER — Telehealth: Payer: Self-pay | Admitting: Pharmacist

## 2023-07-25 MED ORDER — APIXABAN 2.5 MG PO TABS: 2.5000 mg | ORAL_TABLET | Freq: Two times a day (BID) | ORAL | Status: DC

## 2023-07-25 NOTE — Telephone Encounter (Signed)
Called pt to discuss that Dr Patsy Lager had approved change from Xarelto to Eliquis 2.5mg  twice a day.  I will leave her samples for 1 month and maybe a free 30 day coupon care if we have one when I am back in the office Monday 12/16.  Unable to reach patient by LM on VM

## 2023-08-04 ENCOUNTER — Telehealth (HOSPITAL_BASED_OUTPATIENT_CLINIC_OR_DEPARTMENT_OTHER): Payer: Self-pay | Admitting: Family Medicine

## 2023-08-08 DIAGNOSIS — R072 Precordial pain: Secondary | ICD-10-CM | POA: Diagnosis not present

## 2023-08-08 DIAGNOSIS — E785 Hyperlipidemia, unspecified: Secondary | ICD-10-CM | POA: Diagnosis not present

## 2023-08-08 DIAGNOSIS — I10 Essential (primary) hypertension: Secondary | ICD-10-CM | POA: Diagnosis not present

## 2023-08-08 DIAGNOSIS — Z86718 Personal history of other venous thrombosis and embolism: Secondary | ICD-10-CM | POA: Diagnosis not present

## 2023-08-08 LAB — BASIC METABOLIC PANEL
BUN/Creatinine Ratio: 14 (ref 12–28)
BUN: 12 mg/dL (ref 8–27)
CO2: 25 mmol/L (ref 20–29)
Calcium: 9.6 mg/dL (ref 8.7–10.3)
Chloride: 106 mmol/L (ref 96–106)
Creatinine, Ser: 0.86 mg/dL (ref 0.57–1.00)
Glucose: 102 mg/dL — ABNORMAL HIGH (ref 70–99)
Potassium: 4.7 mmol/L (ref 3.5–5.2)
Sodium: 145 mmol/L — ABNORMAL HIGH (ref 134–144)
eGFR: 73 mL/min/{1.73_m2} (ref >59)

## 2023-08-11 ENCOUNTER — Encounter (HOSPITAL_BASED_OUTPATIENT_CLINIC_OR_DEPARTMENT_OTHER): Payer: Self-pay

## 2023-08-11 ENCOUNTER — Encounter: Payer: Self-pay | Admitting: Family Medicine

## 2023-08-11 ENCOUNTER — Ambulatory Visit (HOSPITAL_BASED_OUTPATIENT_CLINIC_OR_DEPARTMENT_OTHER)
Admission: RE | Admit: 2023-08-11 | Discharge: 2023-08-11 | Disposition: A | Discharge status: Home / Self Care | Payer: Medicare Other | Source: Ambulatory Visit | Attending: Family Medicine | Admitting: Family Medicine

## 2023-08-11 DIAGNOSIS — Z780 Asymptomatic menopausal state: Secondary | ICD-10-CM | POA: Diagnosis not present

## 2023-08-11 DIAGNOSIS — Z1231 Encounter for screening mammogram for malignant neoplasm of breast: Secondary | ICD-10-CM | POA: Insufficient documentation

## 2023-08-11 DIAGNOSIS — M8580 Other specified disorders of bone density and structure, unspecified site: Secondary | ICD-10-CM | POA: Insufficient documentation

## 2023-08-11 DIAGNOSIS — M85852 Other specified disorders of bone density and structure, left thigh: Secondary | ICD-10-CM | POA: Diagnosis not present

## 2023-08-18 ENCOUNTER — Telehealth: Payer: Medicare Other

## 2023-08-18 ENCOUNTER — Telehealth: Payer: Self-pay | Admitting: Pharmacist

## 2023-08-18 NOTE — Telephone Encounter (Signed)
 Attempt was made to contact patient by phone today for follow up by Clinical Pharmacist regarding Eliquis  samples and 2025 cost.  If she kept her same plan [facility] then she will have a $255 deductible to meet and $47 copay for Eliquis . Her first Eliquis  refill will be $302, then $47 for the rest of 2025.   Unable to reach patient. LM on VM with my contact number [phone] or [phone].

## 2023-09-01 ENCOUNTER — Ambulatory Visit: Payer: Medicare Other | Admitting: Cardiology

## 2023-09-03 ENCOUNTER — Ambulatory Visit (INDEPENDENT_AMBULATORY_CARE_PROVIDER_SITE_OTHER): Payer: Medicare Other | Admitting: Pharmacist

## 2023-09-03 DIAGNOSIS — I825Z9 Chronic embolism and thrombosis of unspecified deep veins of unspecified distal lower extremity: Secondary | ICD-10-CM

## 2023-09-03 MED ORDER — RIVAROXABAN 20 MG PO TABS: 20.0000 mg | ORAL_TABLET | Freq: Every day | ORAL | 2 refills | Status: DC

## 2023-09-03 MED ORDER — REPATHA SURECLICK 140 MG/ML ~~LOC~~ SOAJ: SUBCUTANEOUS | 1 refills | Status: DC

## 2023-09-03 NOTE — Progress Notes (Signed)
09/03/2023 Name: [name] MRN: [id number] DOB: [birth year]  Chief Complaint  Patient presents with   Medication Management    [name] is a 68 y.o. year old female who presented for a telephone visit.   They were referred to the pharmacist by their PCP for assistance in managing medication access.    Subjective:  Medication Access/Adherence  Patient has been prescribed Xarelto but has been taking inconsistently due to cost. She is in Medicare coverage gap and cost for 1 month supply of Xarelto is $150.  Patient is taking Xarelto for recurrent DVT.  07/25/2023 - Dr Patsy Lager approved change from Xarelto to Eliquis 2.5mg  twice a day and we left patient samples at the fron desk but she never picked up. Called patient today to discuss DOAC therapy and 2025 Medicare coverage.   Patient is also taking Repatha but per patient she pays $0 for Repatha. She has a Orthoptist to help with cost of Repatha.   Patient reports affordability concerns with their medications: Yes  Patient reports access/transportation concerns to their pharmacy: No  Patient reports adherence concerns with their medications:  Yes     Current Pharmacy:  [id number] - [name], [place] - [address] [place] [address] Phone: [phone] Fax: [phone]  [name] [name], [place] - [address] AT [facility] [address] [place] [address] Phone: [phone] Fax: [phone]    Objective:  Lab Results  Component Value Date   HGBA1C 5.8 01/13/2023    Lab Results  Component Value Date   CREATININE 0.86 08/08/2023   BUN 12 08/08/2023   NA 145 (H) 08/08/2023   K 4.7 08/08/2023   CL 106 08/08/2023   CO2 25 08/08/2023    Lab Results  Component Value Date   CHOL 150 01/13/2023   HDL 60.70 01/13/2023   LDLCALC 71 01/13/2023   TRIG 95.0 01/13/2023   CHOLHDL 2 01/13/2023    Medications  Reviewed Today     Reviewed by Henrene Pastor, RPH-CPP (Pharmacist) on 09/03/23 at 1456  Med List Status: <None>   Medication Order Taking? Sig Documenting Provider Last Dose Status Informant  apixaban (ELIQUIS) 2.5 MG TABS tablet [id number] No Take 1 tablet (2.5 mg total) by mouth 2 (two) times daily.  Patient not taking: Reported on 09/03/2023   Copland, Gwenlyn Found, MD Not Taking Active   chlorthalidone (HYGROTON) 25 MG tablet [id number]  Take 0.5 tablets (12.5 mg total) by mouth daily. Copland, Gwenlyn Found, MD  Active   Cholecalciferol (VITAMIN D3) 1.25 MG (50000 UT) CAPS [id number] Yes Take 1 weekly for 12 weeks Copland, Gwenlyn Found, MD Taking Active   Evolocumab [facility] SURECLICK) 140 MG/ML Ivory Broad [id number] Yes Repatha SureClick 140 mg/mL subcutaneous pen injector  INJECT 1 PEN INTO THE SKIN EVERY 14 (FOURTEEN) DAYS. Lewayne Bunting, MD Taking Active   hydrALAZINE (APRESOLINE) 25 MG tablet [id number] Yes Take 1 tablet (25 mg total) by mouth 3 (three) times daily. Lewayne Bunting, MD Taking Active   hydrALAZINE (APRESOLINE) 50 MG tablet [id number] Yes Take 1 tablet (50 mg total) by mouth 3 (three) times daily. Copland, Gwenlyn Found, MD Taking Active   olmesartan (BENICAR) 40 MG tablet [id number] Yes Take 1 tablet (40 mg total) by mouth daily. Reather Littler D, NP Taking Active   pantoprazole (PROTONIX) 40 MG tablet [id number]  Take 1 tablet by mouth daily. [provider]  Active  Assessment/Plan:   Medication Management / Access:  - Discussed her 2025 Medicare plan. Cost of Vonzella Nipple or Eliquis would be the same. I thought she might have a $325 deductible.  She would prefer to take Xarelto 20mg  daily if the DOACs will cost the same.  - Assisted patient in applying for Medicare Extra Help / LIS at out last visit. She states she has not received a letter yet However, [facility] and her coapy was $12.15 which means she was approved for Extra Help.  All her brand medications will be $12.15 and she will not have a deductible.   Patient will notify Clinical Pharmacist Practitioner if any additional issues with medication costs.   Henrene Pastor, PharmD Clinical Pharmacist [facility]   t

## 2023-09-08 ENCOUNTER — Ambulatory Visit: Payer: Medicare Other | Attending: Cardiology | Admitting: Pharmacist Clinician (PhC)/ Clinical Pharmacy Specialist

## 2023-09-08 ENCOUNTER — Encounter: Payer: Self-pay | Admitting: Pharmacist Clinician (PhC)/ Clinical Pharmacy Specialist

## 2023-09-08 VITALS — BP 179/79 | HR 63

## 2023-09-08 DIAGNOSIS — I10 Essential (primary) hypertension: Secondary | ICD-10-CM

## 2023-09-08 MED ORDER — CHLORTHALIDONE 25 MG PO TABS: 12.5000 mg | ORAL_TABLET | Freq: Every day | ORAL | 3 refills | Status: DC

## 2023-09-08 NOTE — Patient Instructions (Signed)
Follow up appointment: with Fleet Contras on Thursday  Go to the lab in 10-14 days to check kidney function  Take your BP meds as follows:  AM:  chlorthalidone 12.5 mg (1/2 tablet), hydralazine 25 mg, hydralazine 50 mg,   Noon:  hydralazine 25 mg, hydralazine 50 mg PM:  olmesartan 40 mg, hydralazine 25 mg, hydralazine 50 mg   Check your blood pressure at home daily (if able) and keep record of the readings.  Your blood pressure goal is < 130/80  To check your pressure at home you will need to:  1. Sit up in a chair, with feet flat on the floor and back supported. Do not cross your ankles or legs. 2. Rest your left arm so that the cuff is about heart level. If the cuff goes on your upper arm,  then just relax the arm on the table, arm of the chair or your lap. If you have a wrist cuff, we  suggest relaxing your wrist against your chest (think of it as Pledging the Flag with the  wrong arm).  3. Place the cuff snugly around your arm, about 1 inch above the crook of your elbow. The  cords should be inside the groove of your elbow.  4. Sit quietly, with the cuff in place, for about 5 minutes. After that 5 minutes press the power  button to start a reading. 5. Do not talk or move while the reading is taking place.  6. Record your readings on a sheet of paper. Although most cuffs have a memory, it is often  easier to see a pattern developing when the numbers are all in front of you.  7. You can repeat the reading after 1-3 minutes if it is recommended  Make sure your bladder is empty and you have not had caffeine or tobacco within the last 30 min  Always bring your blood pressure log with you to your appointments. If you have not brought your monitor in to be double checked for accuracy, please bring it to your next appointment.  You can find a list of quality blood pressure cuffs at [email or web address]  Important lifestyle changes to control high blood pressure  Intervention  Effect on the  BP  Lose extra pounds and watch your waistline Weight loss is one of the most effective lifestyle changes for controlling blood pressure. If you're overweight or obese, losing even a small amount of weight can help reduce blood pressure. Blood pressure might go down by about 1 millimeter of mercury (mm Hg) with each kilogram (about 2.2 pounds) of weight lost.  Exercise regularly As a general goal, aim for at least 30 minutes of moderate physical activity every day. Regular physical activity can lower high blood pressure by about 5 to 8 mm Hg.  Eat a healthy diet Eating a diet rich in whole grains, fruits, vegetables, and low-fat dairy products and low in saturated fat and cholesterol. A healthy diet can lower high blood pressure by up to 11 mm Hg.  Reduce salt (sodium) in your diet Even a small reduction of sodium in the diet can improve heart health and reduce high blood pressure by about 5 to 6 mm Hg.  Limit alcohol One drink equals 12 ounces of beer, 5 ounces of wine, or 1.5 ounces of 80-proof liquor.  Limiting alcohol to less than one drink a day for women or two drinks a day for men can help lower blood pressure by about 4 mm Hg.  If you have any questions or concerns please use My Chart to send questions or call the office at [phone]

## 2023-09-08 NOTE — Assessment & Plan Note (Signed)
Goal BP: <130/80 Not currently at goal, in clinic BP 179/73 Will start the chlorthalidone 25 mg at half a tablet (12.5 mg) daily Will order a repeat BMP to be taken in 10-14 days Follow up with Katlyn on 09/11/2023

## 2023-09-08 NOTE — Progress Notes (Signed)
Office Visit    Patient Name: [name] Date of Encounter: 09/08/2023  Primary Care Provider:  Pearline Cables, MD Primary Cardiologist:  Olga Millers, MD  Chief Complaint    Hypertension  Significant Past Medical History   HLD 6/24 LDL 71 on Repatha  preDM 6/24 A1c 5.8  DVT Multiple, most recent in 2019, on lifelong anticoagulation (Xarelto)    No Known Allergies  History of Present Illness    HPI: 68 yo female who is coming in today for her hypertension. She has reported at the last visit that her home readings would reach up to a SBP of 200 and has experienced chest pain when her BP was high. On 07/24/2023, BP was initially 170/80 and dropped to158/84. She was taking lisinopril 40 mg daily, hydralazine 25 mg TID. Lisinopril was changed to Olmesartan 40 mg daily and hydralazine was increased to 75 mg TID. She was listed as being on chlorthalidone 12.5 mg daily, however she reported not taking this. Today, her home readings averaged 149/92 in the morning and 163/93 in the evening. PMH: HTN, DVT (2019, recurrent on Xarelto), HLD (on Repatha) Family History Father: No Mother: No Social History: Tobacco: no Alcohol: no Diet:  Does not eat out, cooks at home, fresh foods, not frozen Typically eats fish as source of protein and a variety of fruits and vegetables Does add some salt Exercise: has a machine for walking, a pull up bar, and does some body weight exercises  Accessory Clinical Findings    Lab Results  Component Value Date   CREATININE 0.86 08/08/2023   BUN 12 08/08/2023   NA 145 (H) 08/08/2023   K 4.7 08/08/2023   CL 106 08/08/2023   CO2 25 08/08/2023   Lab Results  Component Value Date   ALT 15 01/13/2023   AST 17 01/13/2023   ALKPHOS 79 01/13/2023   BILITOT 0.7 01/13/2023   Lab Results  Component Value Date   HGBA1C 5.8 01/13/2023    Home Medications    Current Outpatient Medications  Medication Sig Dispense Refill    chlorthalidone (HYGROTON) 25 MG tablet Take 0.5 tablets (12.5 mg total) by mouth daily. 45 tablet 3   Cholecalciferol (VITAMIN D3) 1.25 MG (50000 UT) CAPS Take 1 weekly for 12 weeks 12 capsule 0   Evolocumab (REPATHA SURECLICK) 140 MG/ML SOAJ Repatha SureClick 140 mg/mL subcutaneous pen injector  INJECT 1 PEN INTO THE SKIN EVERY 14 (FOURTEEN) DAYS. 2 mL 1   hydrALAZINE (APRESOLINE) 25 MG tablet Take 1 tablet (25 mg total) by mouth 3 (three) times daily. 270 tablet 3   hydrALAZINE (APRESOLINE) 50 MG tablet Take 1 tablet (50 mg total) by mouth 3 (three) times daily. 90 tablet 3   olmesartan (BENICAR) 40 MG tablet Take 1 tablet (40 mg total) by mouth daily. 90 tablet 0   pantoprazole (PROTONIX) 40 MG tablet Take 1 tablet by mouth daily.     rivaroxaban (XARELTO) 20 MG TABS tablet Take 1 tablet (20 mg total) by mouth daily with supper. (Patient not taking: Reported on 09/03/2023) 30 tablet 2   No current facility-administered medications for this visit.     HYPERTENSION CONTROL Vitals:   09/08/23 0841 09/08/23 0845  BP: (!) 179/73 (!) 179/79    The patient's blood pressure is elevated above target today.  In order to address the patient's elevated BP: A new medication was prescribed today.; Blood pressure will be monitored at home to determine if medication changes need to be  made.      Assessment & Plan    HTN (hypertension) Goal BP: <130/80 Not currently at goal, in clinic BP 179/73 Will start the chlorthalidone 25 mg at half a tablet (12.5 mg) daily Will order a repeat BMP to be taken in 10-14 days Follow up with Katlyn on 09/11/2023  [name] PharmD Candidate [facility] [employer] [facility], Class of 2025  I was with patient and student for entire appointment and agree with above assessment and plan.   Phillips Hay PharmD CPP [facility]  [address] [place], [place] [address] [phone]

## 2023-09-08 NOTE — Progress Notes (Signed)
Cardiology Office Note    Date:  09/12/2023  ID:  [name], DOB 07/26/1956, MRN [id number] PCP:  Pearline Cables, MD  Cardiologist:  Olga Millers, MD  Electrophysiologist:  None   Chief Complaint: Follow up for hypertension  History of Present Illness: .    [name] is a 68 y.o. female with visit-pertinent history of hypertension, recurrent DVT on Xarelto, hyperlipidemia previous mediastinal mass status post thymectomy.  Echocardiogram in August 2021 which showed normal LV function with grade 1 diastolic dysfunction.  Noted venous Dopplers August 2021 showed chronic DVT involving right peroneal vein and chronic DVT left peroneal vein.  MRI August 2021 showed no high-grade narrowing, dissection or aneurysm within the neck.  Cardiac CTA in February 2023 showed calcium score of 0 and no coronary artery disease.   She was last seen by Dr. Jens Som on 04/09/2023 she continued to have occasional chest pain that can occur either with activity or at rest that was unchanged, she denied dyspnea or syncope.  Noted that her blood pressure continued to run high at home.  She was started on hydralazine 25 mg three times a day.  She saw her PCP on 07/09/2023 for elevated blood pressure.  Patient noted that her blood pressure at the previous Saturday had been up to 200 systolic.  It was recommended that her hydralazine be increased to 50 mg for 3 times a day and then go up to 75 3 times a day.  She was seen in clinic on 07/24/2023 regarding her blood pressure.  She noted that her blood pressure could be as elevated up to the 190s to 200s systolic at night.  Her lisinopril was discontinued and she was started on olmesartan 40 mg daily, it is recommended that she continue hydralazine and chlorthalidone.  She was seen in clinic by Pharm.D. on 09/08/2023 her blood pressure was elevated at 179/73.  Patient was listed as taking chlorthalidone however he reported is not taking.  She is instructed  to start chlorthalidone 12.5 mg daily.  Today she presents for follow-up regarding hypertension.  She reports that she is doing well.  She feels that she has had some improvement in her blood pressure, unfortunately she does not have a log with her today.  She did start her chlorthalidone, notes she is tolerating well.  She notes some mild ankle edema.  She denies chest pain, shortness of breath, palpitations, orthopnea or PND.    ROS: .   Today she denies chest pain, shortness of breath, fatigue, palpitations, melena, hematuria, hemoptysis, diaphoresis, weakness, presyncope, syncope, orthopnea, and PND.  All other systems are reviewed and otherwise negative. Studies Reviewed: Marland Kitchen    EKG:  EKG is not ordered today.  CV Studies:  Cardiac Studies & Procedures      ECHOCARDIOGRAM  ECHOCARDIOGRAM COMPLETE 03/22/2020  Narrative ECHOCARDIOGRAM REPORT    Patient Name:   [name] Date of Exam: 03/22/2020 Medical Rec #:  [id number]         Height:       67.0 in Accession #:    [id number]        Weight:       191.1 lb Date of Birth:  08-05-1956         BSA:          1.984 m Patient Age:    64 years          BP:           127/76 mmHg  Patient Gender: F                 HR:           57 bpm. Exam Location:  Inpatient  Procedure: 2D Echo, Cardiac Doppler and Color Doppler  Indications:    Stroke 434.91  History:        Patient has no prior history of Echocardiogram examinations. Risk Factors:Hypertension, Dyslipidemia and Non-Smoker.  Sonographer:    Renella Cunas RDCS Referring Phys: [id number] Kasandra Knudsen PAHWANI  IMPRESSIONS   1. Left ventricular ejection fraction, by estimation, is 55 to 60%. The left ventricle has normal function. The left ventricle has no regional wall motion abnormalities. Left ventricular diastolic parameters are consistent with Grade I diastolic dysfunction (impaired relaxation). 2. Right ventricular systolic function is normal. The right ventricular size is normal.  Tricuspid regurgitation signal is inadequate for assessing PA pressure. 3. The mitral valve is normal in structure. No evidence of mitral valve regurgitation. No evidence of mitral stenosis. 4. The aortic valve is tricuspid. Aortic valve regurgitation is not visualized. No aortic stenosis is present. 5. The inferior vena cava is normal in size with greater than 50% respiratory variability, suggesting right atrial pressure of 3 mmHg.  FINDINGS Left Ventricle: Left ventricular ejection fraction, by estimation, is 55 to 60%. The left ventricle has normal function. The left ventricle has no regional wall motion abnormalities. The left ventricular internal cavity size was normal in size. There is no left ventricular hypertrophy. Left ventricular diastolic parameters are consistent with Grade I diastolic dysfunction (impaired relaxation).  Right Ventricle: The right ventricular size is normal. No increase in right ventricular wall thickness. Right ventricular systolic function is normal. Tricuspid regurgitation signal is inadequate for assessing PA pressure.  Left Atrium: Left atrial size was normal in size.  Right Atrium: Right atrial size was normal in size.  Pericardium: There is no evidence of pericardial effusion.  Mitral Valve: The mitral valve is normal in structure. No evidence of mitral valve regurgitation. No evidence of mitral valve stenosis.  Tricuspid Valve: The tricuspid valve is normal in structure. Tricuspid valve regurgitation is not demonstrated.  Aortic Valve: The aortic valve is tricuspid. Aortic valve regurgitation is not visualized. No aortic stenosis is present.  Pulmonic Valve: The pulmonic valve was normal in structure. Pulmonic valve regurgitation is not visualized.  Aorta: The aortic root is normal in size and structure.  Venous: The inferior vena cava is normal in size with greater than 50% respiratory variability, suggesting right atrial pressure of 3  mmHg.  IAS/Shunts: No atrial level shunt detected by color flow Doppler.   LEFT VENTRICLE PLAX 2D LVIDd:         4.60 cm      Diastology LVIDs:         3.70 cm      LV e' lateral:   6.66 cm/s LV PW:         0.80 cm      LV E/e' lateral: 11.4 LV IVS:        0.80 cm      LV e' medial:    5.63 cm/s LVOT diam:     2.10 cm      LV E/e' medial:  13.5 LV SV:         70 LV SV Index:   35 LVOT Area:     3.46 cm  LV Volumes (MOD) LV vol d, MOD A2C: 110.0 ml LV vol d, MOD A4C: 110.0  ml LV vol s, MOD A2C: 46.7 ml LV vol s, MOD A4C: 44.3 ml LV SV MOD A2C:     63.3 ml LV SV MOD A4C:     110.0 ml LV SV MOD BP:      64.5 ml  RIGHT VENTRICLE RV S prime:     11.50 cm/s TAPSE (M-mode): 1.9 cm  LEFT ATRIUM             Index       RIGHT ATRIUM          Index LA diam:        3.70 cm 1.87 cm/m  RA Area:     9.16 cm LA Vol (A2C):   28.4 ml 14.32 ml/m RA Volume:   16.50 ml 8.32 ml/m LA Vol (A4C):   25.3 ml 12.75 ml/m LA Biplane Vol: 29.1 ml 14.67 ml/m AORTIC VALVE LVOT Vmax:   90.10 cm/s LVOT Vmean:  61.000 cm/s LVOT VTI:    0.201 m  AORTA Ao Root diam: 3.20 cm  MITRAL VALVE MV Area (PHT): 4.06 cm    SHUNTS MV Decel Time: 187 msec    Systemic VTI:  0.20 m MV E velocity: 75.80 cm/s  Systemic Diam: 2.10 cm MV A velocity: 75.80 cm/s MV E/A ratio:  1.00  Marca Ancona MD Electronically signed by Marca Ancona MD Signature Date/Time: 03/22/2020/4:51:53 PM    Final    CT SCANS  CT CORONARY MORPH W/CTA COR W/SCORE 09/13/2021  Addendum 09/13/2021  9:19 AM ADDENDUM REPORT: 09/13/2021 09:16  CLINICAL DATA:  68 yo female with chest pain  EXAM: Cardiac/Coronary CTA  TECHNIQUE: A non-contrast, gated CT scan was obtained with axial slices of 3 mm through the heart for calcium scoring. Calcium scoring was performed using the Agatston method. A 120 kV prospective, gated, contrast cardiac scan was obtained. Gantry rotation speed was 250 msecs and collimation was 0.6 mm. Two  sublingual nitroglycerin tablets (0.8 mg) were given. The 3D data set was reconstructed in 5% intervals of the 35-75% of the R-R cycle. Diastolic phases were analyzed on a dedicated workstation using MPR, MIP, and VRT modes. The patient received 95 cc of contrast.  FINDINGS: Image quality: Average.  Noise artifact is: Limited.  Coronary Arteries:  Normal coronary origin.  Right dominance.  Left main: The left main is a large caliber vessel with a normal take off from the left coronary cusp that bifurcates to form a left anterior descending artery and a left circumflex artery. There is no plaque or stenosis.  Left anterior descending artery: The LAD is patent without evidence of plaque or stenosis. The LAD gives off large patent D1 and D2; small patent D3.  Left circumflex artery: The LCX is non-dominant and patent with no evidence of plaque or stenosis. The LCX gives off patent obtuse marginal branch and terminates into 2 smaller posterolateral branches.  Right coronary artery: The RCA is dominant with normal take off from the right coronary cusp. There is no evidence of plaque or stenosis. The RCA terminates as a PDA and small right posterolateral branch without evidence of plaque or stenosis.  Right Atrium: Right atrial size is within normal limits.  Right Ventricle: The right ventricular cavity is within normal limits.  Left Atrium: Left atrial size is normal in size with no left atrial appendage filling defect.  Left Ventricle: The ventricular cavity size is within normal limits. There are no stigmata of prior infarction. There is no abnormal filling defect.  Pulmonary  arteries: Normal in size without proximal filling defect.  Pulmonary veins: Normal pulmonary venous drainage.  Pericardium: Normal thickness with no significant effusion or calcium present.  Cardiac valves: The aortic valve is trileaflet without significant calcification. The mitral valve is  normal structure without significant calcification.  Aorta: Normal caliber with no significant disease.  Extra-cardiac findings: See attached radiology report for non-cardiac structures.  IMPRESSION: 1. Coronary calcium score of 0. This was 0 percentile for age-, sex, and race-matched controls.  2. Normal coronary origin with right dominance.  3. No evidence of CAD.  4. Mild aortic atherosclerosis.  RECOMMENDATIONS: CAD-RADS 0: No evidence of CAD (0%). Consider non-atherosclerotic causes of chest pain.  Olga Millers, MD   Electronically Signed By: Olga Millers M.D. On: 09/13/2021 09:16  Narrative EXAM: OVER-READ INTERPRETATION  CT CHEST  The following report is an over-read performed by radiologist Dr. Charlett Nose of [facility] on 09/13/2021. This over-read does not include interpretation of cardiac or coronary anatomy or pathology. The coronary CTA interpretation by the cardiologist is attached.  COMPARISON:  None.  FINDINGS: Vascular: Heart is normal size.  Aorta normal caliber.  Mediastinum/Nodes: No adenopathy.  Small hiatal hernia.  Lungs/Pleura: No confluent opacities or effusions.  Upper Abdomen: Imaging into the upper abdomen demonstrates no acute findings.  Musculoskeletal: Chest wall soft tissues are unremarkable. No acute bony abnormality.  IMPRESSION: Small hiatal hernia.  No acute extra cardiac abnormality.  Electronically Signed: By: Charlett Nose M.D. On: 09/13/2021 08:48   CT SCANS  CT CORONARY MORPH W/CTA COR W/SCORE 11/11/2017  Addendum 11/11/2017  3:57 PM ADDENDUM REPORT: 11/11/2017 15:54  EXAM: OVER-READ INTERPRETATION  CT CHEST  The following report is an over-read performed by radiologist Dr. Schuyler Amor [facility] on 11/11/2017. This over-read does not include interpretation of cardiac or coronary anatomy or pathology. The coronary CTA interpretation by the cardiologist  is attached.  COMPARISON:  CT chest 09/04/2017  FINDINGS: The anterior mediastinal mass is again noted measuring 3.5 by 2.5 cm, image 16/11. Previously this measured the same. The visualized portions of the trachea and lower airway are patent. Unremarkable appearance of the esophagus. No adenopathy identified.  No pleural effusion. Pulmonary nodule in the left lower lobe measures 4 mm, image 30/12. No airspace consolidation or atelectasis.  No acute findings within the upper abdomen.  No focal bone abnormality.  IMPRESSION: 1. Stable appearance of anterior mediastinal mass. 2. 4 mm left lower lobe pulmonary nodule noted.   Electronically Signed By: Signa Kell M.D. 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  EXAM: Cardiac/Coronary  CT  TECHNIQUE: The patient was scanned on a [employer].  FINDINGS: A 120 kV prospective scan was triggered in the descending thoracic aorta at 111 HU's. Axial non-contrast 3 mm slices were carried out through the heart. The data set was analyzed on a dedicated work station and scored using the Agatson method. Gantry rotation speed was 250 msecs and collimation was .6 mm. No beta blockade and 0.8 mg of sl NTG was given. The 3D data set was reconstructed in 5% intervals of the 67-82 % of the R-R cycle. Diastolic phases were analyzed on a dedicated work station using MPR, MIP and VRT modes. The patient received 80 cc of contrast.  Aorta: Normal size. Mild calcifications in the aortic arch. Mild atherosclerotic plaque in the descending aorta. No dissection.  Aortic  Valve:  Trileaflet.  No calcifications.  Coronary Arteries:  Normal coronary origin.  Right dominance.  RCA is a large dominant artery that gives rise to PDA and PLVB. There is no plaque.  Left main is a large artery that gives rise to LAD and LCX  arteries.  LAD is a large vessel that gives rise to two diagonal arteries. There is minimal non-calcified plaque in the proximal LAD.  LCX is a non-dominant artery that gives rise to one large OM1 branch. There is no plaque.  Other findings:  Normal pulmonary vein drainage into the left atrium.  Normal let atrial appendage without a thrombus.  Normal size of the pulmonary artery.  IMPRESSION: 1. Coronary calcium score of 0. This was 0 percentile for age and sex matched control.  2. Normal coronary origin with right dominance.  3. The study is significantly affected by motion, however there is no significant plaque in any of the coronary arteries.  Electronically Signed: By: Tobias Alexander On: 11/11/2017 15:25          Current Reported Medications:.    Current Meds  Medication Sig   chlorthalidone (HYGROTON) 25 MG tablet Take 0.5 tablets (12.5 mg total) by mouth daily.   Cholecalciferol (VITAMIN D3) 1.25 MG (50000 UT) CAPS Take 1 weekly for 12 weeks   Evolocumab (REPATHA SURECLICK) 140 MG/ML SOAJ Repatha SureClick 140 mg/mL subcutaneous pen injector  INJECT 1 PEN INTO THE SKIN EVERY 14 (FOURTEEN) DAYS.   hydrALAZINE (APRESOLINE) 25 MG tablet Take 1 tablet (25 mg total) by mouth 3 (three) times daily.   hydrALAZINE (APRESOLINE) 50 MG tablet Take 1 tablet (50 mg total) by mouth 3 (three) times daily.   olmesartan (BENICAR) 40 MG tablet Take 1 tablet (40 mg total) by mouth daily.   pantoprazole (PROTONIX) 40 MG tablet Take 1 tablet by mouth daily.   rivaroxaban (XARELTO) 20 MG TABS tablet Take 1 tablet (20 mg total) by mouth daily with supper.   Physical Exam:    VS:  BP (!) 148/82   Pulse 64   Ht 5\' 7"  (1.702 m)   Wt 189 lb (85.7 kg)   SpO2 96%   BMI 29.60 kg/m    Wt Readings from Last 3 Encounters:  09/11/23 189 lb (85.7 kg)  07/24/23 185 lb (83.9 kg)  07/09/23 186 lb (84.4 kg)    GEN: Well nourished, well developed in no acute distress NECK: No JVD; No  carotid bruits CARDIAC: RRR, no murmurs, rubs, gallops RESPIRATORY:  Clear to auscultation without rales, wheezing or rhonchi  ABDOMEN: Soft, non-tender, non-distended EXTREMITIES:  No edema; No acute deformity   Asessement and Plan:Marland Kitchen    Hypertension: Blood pressure today 150/82, on recheck was 148/82. She reports that her blood pressure has improved at home, she does not have a log for review today.  As she only restarted chlorthalidone 1-2 days ago will not make further changes today.  Encouraged to continue to monitor her blood pressure and record on a log at home.  She will bring this to follow-up. Continue olmesartan 40 mg daily, hydralazine 75 mg 3 times daily and chlorthalidone 12.5 mg daily. She will have repeat BMP in 10 days.   Hyperlipidemia: Last lipid profile on 01/13/2023 indicated total cholesterol 150, triglycerides 95, HDL 60.7 and LDL 71.  Patient is currently on Repatha.  Recurrent DVT: Monitored and managed per PCP.  Currently on Xarelto, monitored and managed by per PCP.    Disposition: F/u with Reather Littler,  NP in 4-6 weeks.   Signed, Rip Harbour, NP

## 2023-09-10 DIAGNOSIS — K449 Diaphragmatic hernia without obstruction or gangrene: Secondary | ICD-10-CM | POA: Diagnosis not present

## 2023-09-10 DIAGNOSIS — K2100 Gastro-esophageal reflux disease with esophagitis, without bleeding: Secondary | ICD-10-CM | POA: Diagnosis not present

## 2023-09-11 ENCOUNTER — Ambulatory Visit: Payer: Medicare Other | Attending: Cardiology | Admitting: Cardiology

## 2023-09-11 ENCOUNTER — Encounter: Payer: Self-pay | Admitting: Cardiology

## 2023-09-11 VITALS — BP 148/82 | HR 64 | Ht 67.0 in | Wt 189.0 lb

## 2023-09-11 DIAGNOSIS — I10 Essential (primary) hypertension: Secondary | ICD-10-CM

## 2023-09-11 DIAGNOSIS — Z86718 Personal history of other venous thrombosis and embolism: Secondary | ICD-10-CM | POA: Diagnosis not present

## 2023-09-11 DIAGNOSIS — E785 Hyperlipidemia, unspecified: Secondary | ICD-10-CM | POA: Diagnosis not present

## 2023-09-11 NOTE — Patient Instructions (Signed)
Medication Instructions:  NO CHANGES *If you need a refill on your cardiac medications before your next appointment, please call your pharmacy*   Lab Work: NO LABS If you have labs (blood work) drawn today and your tests are completely normal, you will receive your results only by: MyChart Message (if you have MyChart) OR A paper copy in the mail If you have any lab test that is abnormal or we need to change your treatment, we will call you to review the results.   Testing/Procedures: NO TESTING   Follow-Up: At [facility], you and your health needs are our priority.  As part of our continuing mission to provide you with exceptional heart care, we have created designated Provider Care Teams.  These Care Teams include your primary Cardiologist (physician) and Advanced Practice Providers (APPs -  Physician Assistants and Nurse Practitioners) who all work together to provide you with the care you need, when you need it.   Your next appointment:   4-6 week(s)  Provider:   Reather Littler, NP    Other Instructions KEEP RECORD OF BLOOD PRESSURE, TAKE BLOOD PRESSURE AT THE SAME TIME EACH DAY.

## 2023-09-12 ENCOUNTER — Encounter: Payer: Self-pay | Admitting: Cardiology

## 2023-09-22 DIAGNOSIS — I10 Essential (primary) hypertension: Secondary | ICD-10-CM | POA: Diagnosis not present

## 2023-09-22 LAB — BASIC METABOLIC PANEL
BUN/Creatinine Ratio: 20 (ref 12–28)
BUN: 22 mg/dL (ref 8–27)
CO2: 28 mmol/L (ref 20–29)
Calcium: 10 mg/dL (ref 8.7–10.3)
Chloride: 103 mmol/L (ref 96–106)
Creatinine, Ser: 1.08 mg/dL — ABNORMAL HIGH (ref 0.57–1.00)
Glucose: 150 mg/dL — ABNORMAL HIGH (ref 70–99)
Potassium: 3.9 mmol/L (ref 3.5–5.2)
Sodium: 143 mmol/L (ref 134–144)
eGFR: 56 mL/min/{1.73_m2} — ABNORMAL LOW (ref >59)

## 2023-09-23 ENCOUNTER — Other Ambulatory Visit: Payer: Self-pay | Admitting: Family Medicine

## 2023-09-23 ENCOUNTER — Encounter: Payer: Self-pay | Admitting: *Deleted

## 2023-09-30 DIAGNOSIS — L259 Unspecified contact dermatitis, unspecified cause: Secondary | ICD-10-CM | POA: Diagnosis not present

## 2023-10-04 ENCOUNTER — Other Ambulatory Visit: Payer: Self-pay | Admitting: Family Medicine

## 2023-10-04 DIAGNOSIS — I10 Essential (primary) hypertension: Secondary | ICD-10-CM

## 2023-10-08 DIAGNOSIS — K449 Diaphragmatic hernia without obstruction or gangrene: Secondary | ICD-10-CM | POA: Diagnosis not present

## 2023-10-08 DIAGNOSIS — K2100 Gastro-esophageal reflux disease with esophagitis, without bleeding: Secondary | ICD-10-CM | POA: Diagnosis not present

## 2023-10-08 NOTE — Progress Notes (Unsigned)
 Cardiology Office Note    Date:  10/10/2023  ID:  Labrisha, Wuellner 1956-06-11, MRN [id number] PCP:  Pearline Cables, MD  Cardiologist:  Olga Millers, MD  Electrophysiologist:  None   Chief Complaint: Follow up for hypertension   History of Present Illness: .    Romonda Parker is a 68 y.o. female with visit-pertinent history of hypertension, recurrent DVT on Xarelto, hyperlipidemia previous mediastinal mass status post thymectomy.  Echocardiogram in August 2021 which showed normal LV function with grade 1 diastolic dysfunction.  Noted venous Dopplers August 2021 showed chronic DVT involving right peroneal vein and chronic DVT left peroneal vein.  MRI August 2021 showed no high-grade narrowing, dissection or aneurysm within the neck.  Cardiac CTA in February 2023 showed calcium score of 0 and no coronary artery disease.   She was last seen by Dr. Jens Som on 04/09/2023 she continued to have occasional chest pain that can occur either with activity or at rest that was unchanged, she denied dyspnea or syncope.  Noted that her blood pressure continued to run high at home.  She was started on hydralazine 25 mg three times a day.  She saw her PCP on 07/09/2023 for elevated blood pressure.  Patient noted that her blood pressure at the previous Saturday had been up to 200 systolic.  It was recommended that her hydralazine be increased to 50 mg for 3 times a day and then go up to 75 3 times a day.  She was seen in clinic on 07/24/2023 regarding her blood pressure.  She noted that her blood pressure could be as elevated up to the 190s to 200s systolic at night.  Her lisinopril was discontinued and she was started on olmesartan 40 mg daily, it is recommended that she continue hydralazine and chlorthalidone.  She was seen in clinic by Pharm.D. on 09/08/2023 her blood pressure was elevated at 179/73.  Patient was listed as taking chlorthalidone however he reported is not taking.  She is instructed  to start chlorthalidone 12.5 mg daily.  Patient was last seen in clinic on 09/11/2023.  She remained stable from a cardiac standpoint.  She had started her chlorthalidone reported she is tolerating well.  As she had only started on her chlorthalidone 2 days prior no further medication changes were made.  Today she presents for follow-up.  She reports that she is doing very well, she reports that her blood pressure has been much better controlled although notes that she is checking her blood pressure prior to taking her morning medications ranging between 140-150 systolic and 80-90 diastolic. She denies chest pain, shortness of breath, lower extremity edema, orthopnea or pnd.  She reports that she is tolerating her medications well, denies dizziness lightheadedness, presyncope or syncope.  She continues to walk daily, she denies any chest pain or shortness of breath with this.  Labwork independently reviewed: 09/22/2023: Sodium 143, potassium 3.9, creatinine 1.08 ROS: .   Today she denies chest pain, shortness of breath, lower extremity edema, fatigue, palpitations, melena, hematuria, hemoptysis, diaphoresis, weakness, presyncope, syncope, orthopnea, and PND.  All other systems are reviewed and otherwise negative. Studies Reviewed: Marland Kitchen    EKG:  EKG is not ordered today.  CV Studies:  Cardiac Studies & Procedures   ______________________________________________________________________________________________     ECHOCARDIOGRAM  ECHOCARDIOGRAM COMPLETE 03/22/2020  Narrative ECHOCARDIOGRAM REPORT    Patient Name:   Tanika Ascher Date of Exam: 03/22/2020 Medical Rec #:  [id number]  Height:       67.0 in Accession #:    [id number]        Weight:       191.1 lb Date of Birth:  Feb 18, 1956         BSA:          1.984 m Patient Age:    64 years          BP:           127/76 mmHg Patient Gender: F                 HR:           57 bpm. Exam Location:  Inpatient  Procedure: 2D Echo,  Cardiac Doppler and Color Doppler  Indications:    Stroke 434.91  History:        Patient has no prior history of Echocardiogram examinations. Risk Factors:Hypertension, Dyslipidemia and Non-Smoker.  Sonographer:    Renella Cunas RDCS Referring Phys: [id number] Kasandra Knudsen PAHWANI  IMPRESSIONS   1. Left ventricular ejection fraction, by estimation, is 55 to 60%. The left ventricle has normal function. The left ventricle has no regional wall motion abnormalities. Left ventricular diastolic parameters are consistent with Grade I diastolic dysfunction (impaired relaxation). 2. Right ventricular systolic function is normal. The right ventricular size is normal. Tricuspid regurgitation signal is inadequate for assessing PA pressure. 3. The mitral valve is normal in structure. No evidence of mitral valve regurgitation. No evidence of mitral stenosis. 4. The aortic valve is tricuspid. Aortic valve regurgitation is not visualized. No aortic stenosis is present. 5. The inferior vena cava is normal in size with greater than 50% respiratory variability, suggesting right atrial pressure of 3 mmHg.  FINDINGS Left Ventricle: Left ventricular ejection fraction, by estimation, is 55 to 60%. The left ventricle has normal function. The left ventricle has no regional wall motion abnormalities. The left ventricular internal cavity size was normal in size. There is no left ventricular hypertrophy. Left ventricular diastolic parameters are consistent with Grade I diastolic dysfunction (impaired relaxation).  Right Ventricle: The right ventricular size is normal. No increase in right ventricular wall thickness. Right ventricular systolic function is normal. Tricuspid regurgitation signal is inadequate for assessing PA pressure.  Left Atrium: Left atrial size was normal in size.  Right Atrium: Right atrial size was normal in size.  Pericardium: There is no evidence of pericardial effusion.  Mitral Valve: The mitral  valve is normal in structure. No evidence of mitral valve regurgitation. No evidence of mitral valve stenosis.  Tricuspid Valve: The tricuspid valve is normal in structure. Tricuspid valve regurgitation is not demonstrated.  Aortic Valve: The aortic valve is tricuspid. Aortic valve regurgitation is not visualized. No aortic stenosis is present.  Pulmonic Valve: The pulmonic valve was normal in structure. Pulmonic valve regurgitation is not visualized.  Aorta: The aortic root is normal in size and structure.  Venous: The inferior vena cava is normal in size with greater than 50% respiratory variability, suggesting right atrial pressure of 3 mmHg.  IAS/Shunts: No atrial level shunt detected by color flow Doppler.   LEFT VENTRICLE PLAX 2D LVIDd:         4.60 cm      Diastology LVIDs:         3.70 cm      LV e' lateral:   6.66 cm/s LV PW:         0.80 cm      LV E/e' lateral:  11.4 LV IVS:        0.80 cm      LV e' medial:    5.63 cm/s LVOT diam:     2.10 cm      LV E/e' medial:  13.5 LV SV:         70 LV SV Index:   35 LVOT Area:     3.46 cm  LV Volumes (MOD) LV vol d, MOD A2C: 110.0 ml LV vol d, MOD A4C: 110.0 ml LV vol s, MOD A2C: 46.7 ml LV vol s, MOD A4C: 44.3 ml LV SV MOD A2C:     63.3 ml LV SV MOD A4C:     110.0 ml LV SV MOD BP:      64.5 ml  RIGHT VENTRICLE RV S prime:     11.50 cm/s TAPSE (M-mode): 1.9 cm  LEFT ATRIUM             Index       RIGHT ATRIUM          Index LA diam:        3.70 cm 1.87 cm/m  RA Area:     9.16 cm LA Vol (A2C):   28.4 ml 14.32 ml/m RA Volume:   16.50 ml 8.32 ml/m LA Vol (A4C):   25.3 ml 12.75 ml/m LA Biplane Vol: 29.1 ml 14.67 ml/m AORTIC VALVE LVOT Vmax:   90.10 cm/s LVOT Vmean:  61.000 cm/s LVOT VTI:    0.201 m  AORTA Ao Root diam: 3.20 cm  MITRAL VALVE MV Area (PHT): 4.06 cm    SHUNTS MV Decel Time: 187 msec    Systemic VTI:  0.20 m MV E velocity: 75.80 cm/s  Systemic Diam: 2.10 cm MV A velocity: 75.80 cm/s MV E/A  ratio:  1.00  Marca Ancona MD Electronically signed by Marca Ancona MD Signature Date/Time: 03/22/2020/4:51:53 PM    Final      CT SCANS  CT CORONARY MORPH W/CTA COR W/SCORE 09/13/2021  Addendum 09/13/2021  9:19 AM ADDENDUM REPORT: 09/13/2021 09:16  CLINICAL DATA:  68 yo female with chest pain  EXAM: Cardiac/Coronary CTA  TECHNIQUE: A non-contrast, gated CT scan was obtained with axial slices of 3 mm through the heart for calcium scoring. Calcium scoring was performed using the Agatston method. A 120 kV prospective, gated, contrast cardiac scan was obtained. Gantry rotation speed was 250 msecs and collimation was 0.6 mm. Two sublingual nitroglycerin tablets (0.8 mg) were given. The 3D data set was reconstructed in 5% intervals of the 35-75% of the R-R cycle. Diastolic phases were analyzed on a dedicated workstation using MPR, MIP, and VRT modes. The patient received 95 cc of contrast.  FINDINGS: Image quality: Average.  Noise artifact is: Limited.  Coronary Arteries:  Normal coronary origin.  Right dominance.  Left main: The left main is a large caliber vessel with a normal take off from the left coronary cusp that bifurcates to form a left anterior descending artery and a left circumflex artery. There is no plaque or stenosis.  Left anterior descending artery: The LAD is patent without evidence of plaque or stenosis. The LAD gives off large patent D1 and D2; small patent D3.  Left circumflex artery: The LCX is non-dominant and patent with no evidence of plaque or stenosis. The LCX gives off patent obtuse marginal branch and terminates into 2 smaller posterolateral branches.  Right coronary artery: The RCA is dominant with normal take off from the right coronary cusp. There is  no evidence of plaque or stenosis. The RCA terminates as a PDA and small right posterolateral branch without evidence of plaque or stenosis.  Right Atrium: Right atrial size is within  normal limits.  Right Ventricle: The right ventricular cavity is within normal limits.  Left Atrium: Left atrial size is normal in size with no left atrial appendage filling defect.  Left Ventricle: The ventricular cavity size is within normal limits. There are no stigmata of prior infarction. There is no abnormal filling defect.  Pulmonary arteries: Normal in size without proximal filling defect.  Pulmonary veins: Normal pulmonary venous drainage.  Pericardium: Normal thickness with no significant effusion or calcium present.  Cardiac valves: The aortic valve is trileaflet without significant calcification. The mitral valve is normal structure without significant calcification.  Aorta: Normal caliber with no significant disease.  Extra-cardiac findings: See attached radiology report for non-cardiac structures.  IMPRESSION: 1. Coronary calcium score of 0. This was 0 percentile for age-, sex, and race-matched controls.  2. Normal coronary origin with right dominance.  3. No evidence of CAD.  4. Mild aortic atherosclerosis.  RECOMMENDATIONS: CAD-RADS 0: No evidence of CAD (0%). Consider non-atherosclerotic causes of chest pain.  Olga Millers, MD   Electronically Signed By: Olga Millers M.D. On: 09/13/2021 09:16  Narrative EXAM: OVER-READ INTERPRETATION  CT CHEST  The following report is an over-read performed by radiologist Dr. Charlett Nose of [facility] on 09/13/2021. This over-read does not include interpretation of cardiac or coronary anatomy or pathology. The coronary CTA interpretation by the cardiologist is attached.  COMPARISON:  None.  FINDINGS: Vascular: Heart is normal size.  Aorta normal caliber.  Mediastinum/Nodes: No adenopathy.  Small hiatal hernia.  Lungs/Pleura: No confluent opacities or effusions.  Upper Abdomen: Imaging into the upper abdomen demonstrates no acute findings.  Musculoskeletal: Chest wall soft tissues  are unremarkable. No acute bony abnormality.  IMPRESSION: Small hiatal hernia.  No acute extra cardiac abnormality.  Electronically Signed: By: Charlett Nose M.D. On: 09/13/2021 08:48   CT SCANS  CT CORONARY MORPH W/CTA COR W/SCORE 11/11/2017  Addendum 11/11/2017  3:57 PM ADDENDUM REPORT: 11/11/2017 15:54  EXAM: OVER-READ INTERPRETATION  CT CHEST  The following report is an over-read performed by radiologist Dr. Schuyler Amor [facility] on 11/11/2017. This over-read does not include interpretation of cardiac or coronary anatomy or pathology. The coronary CTA interpretation by the cardiologist is attached.  COMPARISON:  CT chest 09/04/2017  FINDINGS: The anterior mediastinal mass is again noted measuring 3.5 by 2.5 cm, image 16/11. Previously this measured the same. The visualized portions of the trachea and lower airway are patent. Unremarkable appearance of the esophagus. No adenopathy identified.  No pleural effusion. Pulmonary nodule in the left lower lobe measures 4 mm, image 30/12. No airspace consolidation or atelectasis.  No acute findings within the upper abdomen.  No focal bone abnormality.  IMPRESSION: 1. Stable appearance of anterior mediastinal mass. 2. 4 mm left lower lobe pulmonary nodule noted.   Electronically Signed By: Signa Kell M.D. 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  EXAM: Cardiac/Coronary  CT  TECHNIQUE: The patient was scanned on a [employer].  FINDINGS: A 120 kV prospective scan was triggered in the descending thoracic aorta at 111 HU's. Axial non-contrast 3 mm slices were carried out through the heart. The data set was analyzed on a dedicated work station and  scored using the Agatson method. Gantry rotation speed was 250 msecs and collimation was .6 mm. No beta blockade and 0.8 mg of  sl NTG was given. The 3D data set was reconstructed in 5% intervals of the 67-82 % of the R-R cycle. Diastolic phases were analyzed on a dedicated work station using MPR, MIP and VRT modes. The patient received 80 cc of contrast.  Aorta: Normal size. Mild calcifications in the aortic arch. Mild atherosclerotic plaque in the descending aorta. No dissection.  Aortic Valve:  Trileaflet.  No calcifications.  Coronary Arteries:  Normal coronary origin.  Right dominance.  RCA is a large dominant artery that gives rise to PDA and PLVB. There is no plaque.  Left main is a large artery that gives rise to LAD and LCX arteries.  LAD is a large vessel that gives rise to two diagonal arteries. There is minimal non-calcified plaque in the proximal LAD.  LCX is a non-dominant artery that gives rise to one large OM1 branch. There is no plaque.  Other findings:  Normal pulmonary vein drainage into the left atrium.  Normal let atrial appendage without a thrombus.  Normal size of the pulmonary artery.  IMPRESSION: 1. Coronary calcium score of 0. This was 0 percentile for age and sex matched control.  2. Normal coronary origin with right dominance.  3. The study is significantly affected by motion, however there is no significant plaque in any of the coronary arteries.  Electronically Signed: By: Tobias Alexander On: 11/11/2017 15:25     ______________________________________________________________________________________________       Current Reported Medications:.    Current Meds  Medication Sig   betamethasone valerate (VALISONE) 0.1 % cream Apply 1 Application topically 2 (two) times daily.   chlorthalidone (HYGROTON) 25 MG tablet Take 0.5 tablets (12.5 mg total) by mouth daily.   Cholecalciferol (VITAMIN D3) 1.25 MG (50000 UT) CAPS Take 1 weekly for 12 weeks   Evolocumab (REPATHA SURECLICK) 140 MG/ML SOAJ Inject 140 mg into the skin every 14 (fourteen) days.   hydrALAZINE  (APRESOLINE) 25 MG tablet Take 1 tablet (25 mg total) by mouth 3 (three) times daily.   hydrALAZINE (APRESOLINE) 50 MG tablet TAKE 1 TABLET BY MOUTH THREE TIMES A DAY   olmesartan (BENICAR) 40 MG tablet Take 1 tablet (40 mg total) by mouth daily.   pantoprazole (PROTONIX) 40 MG tablet Take 1 tablet by mouth daily.   rivaroxaban (XARELTO) 20 MG TABS tablet Take 1 tablet (20 mg total) by mouth daily with supper.   Physical Exam:   VS:  BP 128/68   Pulse 72   Ht 5\' 7"  (1.702 m)   Wt 188 lb (85.3 kg)   SpO2 96%   BMI 29.44 kg/m    Wt Readings from Last 3 Encounters:  10/10/23 188 lb (85.3 kg)  09/11/23 189 lb (85.7 kg)  07/24/23 185 lb (83.9 kg)    GEN: Well nourished, well developed in no acute distress NECK: No JVD; No carotid bruits CARDIAC: RRR, no murmurs, rubs, gallops RESPIRATORY:  Clear to auscultation without rales, wheezing or rhonchi  ABDOMEN: Soft, non-tender, non-distended EXTREMITIES:  No edema; No acute deformity  Asessement and Plan:Marland Kitchen    Hypertension: Initial blood pressure today 130/70, on recheck was 128/68. She is checking her blood pressure prior to taking her morning medications ranging between 140-150 systolic and 80-90 diastolic. Encouraged to continue monitoring her blood pressure and record on a log at home, ideally taking her blood pressure 2 hours  after her medications.  Salty 6 sheet and blood pressure log provided.  She will notify the office if her blood pressure is consistently greater than 130/80.  Continue olmesartan 40 mg daily, hydralazine 75 mg 3 times daily and chlorthalidone 12.5 mg daily  Hyperlipidemia: Last lipid profile in 01/13/2023 indicated total cholesterol 150, triglycerides 95, HDL 60.7 and LDL 71.  Continue Repatha.  History of recurrent DVT: Monitored and managed per PCP.  Currently on Xarelto.   Disposition: F/u with Reather Littler, NP in 3 months or sooner if needed.   Signed, Rip Harbour, NP

## 2023-10-10 ENCOUNTER — Encounter: Payer: Self-pay | Admitting: Cardiology

## 2023-10-10 ENCOUNTER — Ambulatory Visit: Payer: Medicare Other | Attending: Cardiology | Admitting: Cardiology

## 2023-10-10 VITALS — BP 128/68 | HR 72 | Ht 67.0 in | Wt 188.0 lb

## 2023-10-10 DIAGNOSIS — E785 Hyperlipidemia, unspecified: Secondary | ICD-10-CM | POA: Diagnosis not present

## 2023-10-10 DIAGNOSIS — Z86718 Personal history of other venous thrombosis and embolism: Secondary | ICD-10-CM

## 2023-10-10 DIAGNOSIS — I10 Essential (primary) hypertension: Secondary | ICD-10-CM

## 2023-10-10 NOTE — Patient Instructions (Signed)
 Medication Instructions:  No changes *If you need a refill on your cardiac medications before your next appointment, please call your pharmacy*  Lab Work: No labs  Testing/Procedures: No testing  Follow-Up: At [facility], you and your health needs are our priority.  As part of our continuing mission to provide you with exceptional heart care, we have created designated Provider Care Teams.  These Care Teams include your primary Cardiologist (physician) and Advanced Practice Providers (APPs -  Physician Assistants and Nurse Practitioners) who all work together to provide you with the care you need, when you need it.  We recommend signing up for the patient portal called "MyChart".  Sign up information is provided on this After Visit Summary.  MyChart is used to connect with patients for Virtual Visits (Telemedicine).  Patients are able to view lab/test results, encounter notes, upcoming appointments, etc.  Non-urgent messages can be sent to your provider as well.   To learn more about what you can do with MyChart, go to [email or web address].    Your next appointment:   3 month(s)  Provider:   Reather Littler, NP   Other Instructions Please take your blood pressure daily for 2 weeks and send in a MyChart message. Please include heart rates. (One message at the end of the 2 weeks).   HOW TO TAKE YOUR BLOOD PRESSURE: Rest 5 minutes before taking your blood pressure. Don't smoke or drink caffeinated beverages for at least 30 minutes before. Take your blood pressure before (not after) you eat. Sit comfortably with your back supported and both feet on the floor (don't cross your legs). Elevate your arm to heart level on a table or a desk. Use the proper sized cuff. It should fit smoothly and snugly around your bare upper arm. There should be enough room to slip a fingertip under the cuff. The bottom edge of the cuff should be 1 inch above the crease of the elbow. Ideally, take 3  measurements at one sitting and record the average.

## 2023-10-21 ENCOUNTER — Other Ambulatory Visit: Payer: Self-pay | Admitting: Cardiology

## 2023-11-13 ENCOUNTER — Other Ambulatory Visit: Payer: Self-pay | Admitting: Family Medicine

## 2023-12-07 ENCOUNTER — Other Ambulatory Visit: Payer: Self-pay | Admitting: Family Medicine

## 2024-01-07 NOTE — Progress Notes (Unsigned)
 Cardiology Office Note    Date:  01/09/2024  ID:  Tira Nicholl, DOB 1955/09/26, MRN [id number] PCP:  Kaylee Partridge, MD  Cardiologist:  Alexandria Angel, MD  Electrophysiologist:  None   Chief Complaint: Follow up for hypertension   History of Present Illness: .    Pina Sirianni is a 68 y.o. female with visit-pertinent history of hypertension, recurrent DVT on Xarelto , hyperlipidemia, previous mediastinal mass status post thymectomy.  Echocardiogram in August 2021 which showed normal LV function with grade 1 diastolic dysfunction.  Noted venous Dopplers August 2021 showed chronic DVT involving right peroneal vein and chronic DVT left peroneal vein.  MRI August 2021 showed no high-grade narrowing, dissection or aneurysm within the neck.  Cardiac CTA in February 2023 showed calcium  score of 0 and no coronary artery disease.   She was last seen by Dr. Audery Blazing on 04/09/2023 she continued to have occasional chest pain that can occur either with activity or at rest that was unchanged, she denied dyspnea or syncope.  Noted that her blood pressure continued to run high at home.  She was started on hydralazine  25 mg three times a day.  She saw her PCP on 07/09/2023 for elevated blood pressure.  Patient noted that her blood pressure at the previous Saturday had been up to 200 systolic.  It was recommended that her hydralazine  be increased to 50 mg for 3 times a day and then go up to 75 3 times a day.  She was seen in clinic on 07/24/2023 regarding her blood pressure.  She noted that her blood pressure could be as elevated up to the 190s to 200s systolic at night.  Her lisinopril  was discontinued and she was started on olmesartan  40 mg daily, it is recommended that she continue hydralazine  and chlorthalidone .  She was seen in clinic by Pharm.D. on 09/08/2023 her blood pressure was elevated at 179/73.  Patient was listed as taking chlorthalidone  however he reported is not taking.  She is  instructed to start chlorthalidone  12.5 mg daily.  Patient was seen in clinic on 09/11/2023.  She remained stable from a cardiac standpoint.  She had started her chlorthalidone  reported she is tolerating well.  As she had only started on her chlorthalidone  2 days prior no further medication changes were made.  Patient was last seen in clinic on 10/02/2023, she remained stable from a cardiac standpoint.  She reported that her blood pressures had significantly improved although reported she was checking her blood pressure prior to taking her morning medications, reported that her systolic was ranging between 140-150 and diastolic 80-90.  In office blood pressure was well-controlled after her morning medications.  It was recommended that she monitor her blood pressure for 2 hours after her medications.  Today she presents for follow-up.  She reports that she has been doing well. She denies any chest pain, shortness of breath, lower extremity edema, orthopnea or pnd.  Patient was initially confused regarding which medication she was taking, by end of visit she reported that she was taking everything as prescribed.  She reports that she has been taking her blood pressure prior to taking any medications with systolics typically between 130 and 140, notes one instance in which her systolic blood pressure was in the 150s however came down after taking her medications.  She has not been keeping a blood pressure log recently.  Patient reports that she is walking on occasion and tolerating well.  ROS: .   Today she denies  chest pain, shortness of breath, lower extremity edema, fatigue, palpitations, melena, hematuria, hemoptysis, diaphoresis, weakness, presyncope, syncope, orthopnea, and PND.  All other systems are reviewed and otherwise negative. Studies Reviewed: Aaron Aas   EKG:  EKG is ordered today, personally reviewed, demonstrating  EKG Interpretation Date/Time:  Friday Jan 09 2024 08:03:47 EDT Ventricular Rate:   66 PR Interval:  140 QRS Duration:  86 QT Interval:  440 QTC Calculation: 461 R Axis:   26  Text Interpretation: Normal sinus rhythm Nonspecific ST and T wave abnormality When compared with ECG of 09-Apr-2023 09:44, Nonspecific T wave abnormality now evident in Anterior leads Nonspecific T wave abnormality, improved in Lateral leads Confirmed by Alexah Kivett [phone]) on 01/09/2024 8:09:02 AM   CV Studies: Cardiac studies reviewed are outlined and summarized above. Otherwise please see EMR for full report. Cardiac Studies & Procedures   ______________________________________________________________________________________________     ECHOCARDIOGRAM  ECHOCARDIOGRAM COMPLETE 03/22/2020  Narrative ECHOCARDIOGRAM REPORT    Patient Name:   Clemencia Matusek Date of Exam: 03/22/2020 Medical Rec #:  [id number]         Height:       67.0 in Accession #:    [id number]        Weight:       191.1 lb Date of Birth:  12-14-1955         BSA:          1.984 m Patient Age:    64 years          BP:           127/76 mmHg Patient Gender: F                 HR:           57 bpm. Exam Location:  Inpatient  Procedure: 2D Echo, Cardiac Doppler and Color Doppler  Indications:    Stroke 434.91  History:        Patient has no prior history of Echocardiogram examinations. Risk Factors:Hypertension, Dyslipidemia and Non-Smoker.  Sonographer:    Joannie Muff RDCS Referring Phys: [id number] Regino Caprio PAHWANI  IMPRESSIONS   1. Left ventricular ejection fraction, by estimation, is 55 to 60%. The left ventricle has normal function. The left ventricle has no regional wall motion abnormalities. Left ventricular diastolic parameters are consistent with Grade I diastolic dysfunction (impaired relaxation). 2. Right ventricular systolic function is normal. The right ventricular size is normal. Tricuspid regurgitation signal is inadequate for assessing PA pressure. 3. The mitral valve is normal in structure. No evidence  of mitral valve regurgitation. No evidence of mitral stenosis. 4. The aortic valve is tricuspid. Aortic valve regurgitation is not visualized. No aortic stenosis is present. 5. The inferior vena cava is normal in size with greater than 50% respiratory variability, suggesting right atrial pressure of 3 mmHg.  FINDINGS Left Ventricle: Left ventricular ejection fraction, by estimation, is 55 to 60%. The left ventricle has normal function. The left ventricle has no regional wall motion abnormalities. The left ventricular internal cavity size was normal in size. There is no left ventricular hypertrophy. Left ventricular diastolic parameters are consistent with Grade I diastolic dysfunction (impaired relaxation).  Right Ventricle: The right ventricular size is normal. No increase in right ventricular wall thickness. Right ventricular systolic function is normal. Tricuspid regurgitation signal is inadequate for assessing PA pressure.  Left Atrium: Left atrial size was normal in size.  Right Atrium: Right atrial size was normal in size.  Pericardium: There is no  evidence of pericardial effusion.  Mitral Valve: The mitral valve is normal in structure. No evidence of mitral valve regurgitation. No evidence of mitral valve stenosis.  Tricuspid Valve: The tricuspid valve is normal in structure. Tricuspid valve regurgitation is not demonstrated.  Aortic Valve: The aortic valve is tricuspid. Aortic valve regurgitation is not visualized. No aortic stenosis is present.  Pulmonic Valve: The pulmonic valve was normal in structure. Pulmonic valve regurgitation is not visualized.  Aorta: The aortic root is normal in size and structure.  Venous: The inferior vena cava is normal in size with greater than 50% respiratory variability, suggesting right atrial pressure of 3 mmHg.  IAS/Shunts: No atrial level shunt detected by color flow Doppler.   LEFT VENTRICLE PLAX 2D LVIDd:         4.60 cm       Diastology LVIDs:         3.70 cm      LV e' lateral:   6.66 cm/s LV PW:         0.80 cm      LV E/e' lateral: 11.4 LV IVS:        0.80 cm      LV e' medial:    5.63 cm/s LVOT diam:     2.10 cm      LV E/e' medial:  13.5 LV SV:         70 LV SV Index:   35 LVOT Area:     3.46 cm  LV Volumes (MOD) LV vol d, MOD A2C: 110.0 ml LV vol d, MOD A4C: 110.0 ml LV vol s, MOD A2C: 46.7 ml LV vol s, MOD A4C: 44.3 ml LV SV MOD A2C:     63.3 ml LV SV MOD A4C:     110.0 ml LV SV MOD BP:      64.5 ml  RIGHT VENTRICLE RV S prime:     11.50 cm/s TAPSE (M-mode): 1.9 cm  LEFT ATRIUM             Index       RIGHT ATRIUM          Index LA diam:        3.70 cm 1.87 cm/m  RA Area:     9.16 cm LA Vol (A2C):   28.4 ml 14.32 ml/m RA Volume:   16.50 ml 8.32 ml/m LA Vol (A4C):   25.3 ml 12.75 ml/m LA Biplane Vol: 29.1 ml 14.67 ml/m AORTIC VALVE LVOT Vmax:   90.10 cm/s LVOT Vmean:  61.000 cm/s LVOT VTI:    0.201 m  AORTA Ao Root diam: 3.20 cm  MITRAL VALVE MV Area (PHT): 4.06 cm    SHUNTS MV Decel Time: 187 msec    Systemic VTI:  0.20 m MV E velocity: 75.80 cm/s  Systemic Diam: 2.10 cm MV A velocity: 75.80 cm/s MV E/A ratio:  1.00  Peder Bourdon MD Electronically signed by Peder Bourdon MD Signature Date/Time: 03/22/2020/4:51:53 PM    Final      CT SCANS  CT CORONARY MORPH W/CTA COR W/SCORE 09/13/2021  Addendum 09/13/2021  9:19 AM ADDENDUM REPORT: 09/13/2021 09:16  CLINICAL DATA:  68 yo female with chest pain  EXAM: Cardiac/Coronary CTA  TECHNIQUE: A non-contrast, gated CT scan was obtained with axial slices of 3 mm through the heart for calcium  scoring. Calcium  scoring was performed using the Agatston method. A 120 kV prospective, gated, contrast cardiac scan was obtained. Gantry rotation speed was 250 msecs and  collimation was 0.6 mm. Two sublingual nitroglycerin  tablets (0.8 mg) were given. The 3D data set was reconstructed in 5% intervals of the 35-75% of the R-R  cycle. Diastolic phases were analyzed on a dedicated workstation using MPR, MIP, and VRT modes. The patient received 95 cc of contrast.  FINDINGS: Image quality: Average.  Noise artifact is: Limited.  Coronary Arteries:  Normal coronary origin.  Right dominance.  Left main: The left main is a large caliber vessel with a normal take off from the left coronary cusp that bifurcates to form a left anterior descending artery and a left circumflex artery. There is no plaque or stenosis.  Left anterior descending artery: The LAD is patent without evidence of plaque or stenosis. The LAD gives off large patent D1 and D2; small patent D3.  Left circumflex artery: The LCX is non-dominant and patent with no evidence of plaque or stenosis. The LCX gives off patent obtuse marginal branch and terminates into 2 smaller posterolateral branches.  Right coronary artery: The RCA is dominant with normal take off from the right coronary cusp. There is no evidence of plaque or stenosis. The RCA terminates as a PDA and small right posterolateral branch without evidence of plaque or stenosis.  Right Atrium: Right atrial size is within normal limits.  Right Ventricle: The right ventricular cavity is within normal limits.  Left Atrium: Left atrial size is normal in size with no left atrial appendage filling defect.  Left Ventricle: The ventricular cavity size is within normal limits. There are no stigmata of prior infarction. There is no abnormal filling defect.  Pulmonary arteries: Normal in size without proximal filling defect.  Pulmonary veins: Normal pulmonary venous drainage.  Pericardium: Normal thickness with no significant effusion or calcium  present.  Cardiac valves: The aortic valve is trileaflet without significant calcification. The mitral valve is normal structure without significant calcification.  Aorta: Normal caliber with no significant disease.  Extra-cardiac findings:  See attached radiology report for non-cardiac structures.  IMPRESSION: 1. Coronary calcium  score of 0. This was 0 percentile for age-, sex, and race-matched controls.  2. Normal coronary origin with right dominance.  3. No evidence of CAD.  4. Mild aortic atherosclerosis.  RECOMMENDATIONS: CAD-RADS 0: No evidence of CAD (0%). Consider non-atherosclerotic causes of chest pain.  Alexandria Angel, MD   Electronically Signed By: Alexandria Angel M.D. On: 09/13/2021 09:16  Narrative EXAM: OVER-READ INTERPRETATION  CT CHEST  The following report is an over-read performed by radiologist Dr. Janeece Mechanic of [facility] on 09/13/2021. This over-read does not include interpretation of cardiac or coronary anatomy or pathology. The coronary CTA interpretation by the cardiologist is attached.  COMPARISON:  None.  FINDINGS: Vascular: Heart is normal size.  Aorta normal caliber.  Mediastinum/Nodes: No adenopathy.  Small hiatal hernia.  Lungs/Pleura: No confluent opacities or effusions.  Upper Abdomen: Imaging into the upper abdomen demonstrates no acute findings.  Musculoskeletal: Chest wall soft tissues are unremarkable. No acute bony abnormality.  IMPRESSION: Small hiatal hernia.  No acute extra cardiac abnormality.  Electronically Signed: By: Janeece Mechanic M.D. On: 09/13/2021 08:48   CT SCANS  CT CORONARY MORPH W/CTA COR W/SCORE 11/11/2017  Addendum 11/11/2017  3:57 PM ADDENDUM REPORT: 11/11/2017 15:54  EXAM: OVER-READ INTERPRETATION  CT CHEST  The following report is an over-read performed by radiologist Dr. Mort Ards [facility] on 11/11/2017. This over-read does not include interpretation of cardiac or coronary anatomy or pathology. The coronary CTA interpretation by the  cardiologist is attached.  COMPARISON:  CT chest 09/04/2017  FINDINGS: The anterior mediastinal mass is again noted measuring 3.5 by 2.5 cm, image 16/11.  Previously this measured the same. The visualized portions of the trachea and lower airway are patent. Unremarkable appearance of the esophagus. No adenopathy identified.  No pleural effusion. Pulmonary nodule in the left lower lobe measures 4 mm, image 30/12. No airspace consolidation or atelectasis.  No acute findings within the upper abdomen.  No focal bone abnormality.  IMPRESSION: 1. Stable appearance of anterior mediastinal mass. 2. 4 mm left lower lobe pulmonary nodule noted.   Electronically Signed By: Kimberley Penman M.D. 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  EXAM: Cardiac/Coronary  CT  TECHNIQUE: The patient was scanned on a [employer].  FINDINGS: A 120 kV prospective scan was triggered in the descending thoracic aorta at 111 HU's. Axial non-contrast 3 mm slices were carried out through the heart. The data set was analyzed on a dedicated work station and scored using the Agatson method. Gantry rotation speed was 250 msecs and collimation was .6 mm. No beta blockade and 0.8 mg of sl NTG was given. The 3D data set was reconstructed in 5% intervals of the 67-82 % of the R-R cycle. Diastolic phases were analyzed on a dedicated work station using MPR, MIP and VRT modes. The patient received 80 cc of contrast.  Aorta: Normal size. Mild calcifications in the aortic arch. Mild atherosclerotic plaque in the descending aorta. No dissection.  Aortic Valve:  Trileaflet.  No calcifications.  Coronary Arteries:  Normal coronary origin.  Right dominance.  RCA is a large dominant artery that gives rise to PDA and PLVB. There is no plaque.  Left main is a large artery that gives rise to LAD and LCX arteries.  LAD is a large vessel that gives rise to two diagonal arteries. There is minimal non-calcified plaque in the proximal LAD.  LCX is a  non-dominant artery that gives rise to one large OM1 branch. There is no plaque.  Other findings:  Normal pulmonary vein drainage into the left atrium.  Normal let atrial appendage without a thrombus.  Normal size of the pulmonary artery.  IMPRESSION: 1. Coronary calcium  score of 0. This was 0 percentile for age and sex matched control.  2. Normal coronary origin with right dominance.  3. The study is significantly affected by motion, however there is no significant plaque in any of the coronary arteries.  Electronically Signed: By: Christoper Crafts On: 11/11/2017 15:25     ______________________________________________________________________________________________       Current Reported Medications:.    Current Meds  Medication Sig   betamethasone  valerate (VALISONE ) 0.1 % cream Apply 1 Application topically 2 (two) times daily.   chlorthalidone  (HYGROTON ) 25 MG tablet Take 0.5 tablets (12.5 mg total) by mouth daily.   Cholecalciferol (VITAMIN D3) 1.25 MG (50000 UT) CAPS Take 1 weekly for 12 weeks   Evolocumab  (REPATHA  SURECLICK) 140 MG/ML SOAJ Inject 140 mg into the skin every 14 (fourteen) days.   hydrALAZINE  (APRESOLINE ) 25 MG tablet Take 1 tablet (25 mg total) by mouth 3 (three) times daily.   hydrALAZINE  (APRESOLINE ) 50 MG tablet TAKE 1 TABLET BY MOUTH THREE TIMES A DAY   olmesartan  (BENICAR ) 40 MG tablet TAKE 1 TABLET BY MOUTH EVERY DAY   pantoprazole  (PROTONIX ) 40 MG tablet Take 1 tablet by mouth daily.  XARELTO  20 MG TABS tablet TAKE 1 TABLET BY MOUTH DAILY WITH SUPPER.   Physical Exam:    VS:  BP 136/72   Pulse 66   Ht 5\' 6"  (1.676 m)   Wt 189 lb (85.7 kg)   SpO2 97%   BMI 30.51 kg/m    Wt Readings from Last 3 Encounters:  01/09/24 189 lb (85.7 kg)  10/10/23 188 lb (85.3 kg)  09/11/23 189 lb (85.7 kg)    GEN: Well nourished, well developed in no acute distress NECK: No JVD; No carotid bruits CARDIAC: RRR, no murmurs, rubs, gallops RESPIRATORY:   Clear to auscultation without rales, wheezing or rhonchi  ABDOMEN: Soft, non-tender, non-distended EXTREMITIES:  No edema; No acute deformity     Asessement and Plan:Aaron Aas    Hypertension: Initial blood pressure today 144/72, on recheck was 136/72.  Patient reports that she took her medications just prior to leaving for her appointment.  She reports that she is not consistently checking her blood pressure at home, she reports when she does check it it is prior to any medications and her systolic is typically between 657 and 140, she noted 1 instance with systolic with a systolic in the 150 that she reports came down after taking her medications.  Patient believes that she is taking all of her medications that are currently prescribed although there was some noted confusion at the start of her appointment.  Requested that patient confirm what medication she is taking when she gets home and notify the office.  Also requested that she keep a blood pressure log, provided at appointment, monitoring her blood pressures 2 hours after she takes her medications.  Patient requested a 1 month follow-up, she reports she will bring her pill bottles to confirm her medications and blood pressure log. Check BMET today.  Continue olmesartan  40 mg daily, hydralazine  75 mg 3 times daily and chlorthalidone  12.5 mg daily.  Hyperlipidemia: Last profile on 02/09/2023 indicated total cholesterol 150, triglycerides 95, HDL 60 and LDL 71.  Continue Repatha .  History of recurrent DVT: Monitor managed by PCP.  Currently on Xarelto , patient reports adherence, denies any significant bleeding problems.   Disposition: F/u with Champ Keetch, NP in one month.   Signed, Carrissa Taitano D Fleurette Woolbright, NP

## 2024-01-09 ENCOUNTER — Ambulatory Visit: Payer: Medicare Other | Attending: Cardiology | Admitting: Cardiology

## 2024-01-09 ENCOUNTER — Encounter: Payer: Self-pay | Admitting: Cardiology

## 2024-01-09 VITALS — BP 136/72 | HR 66 | Ht 66.0 in | Wt 189.0 lb

## 2024-01-09 DIAGNOSIS — Z86718 Personal history of other venous thrombosis and embolism: Secondary | ICD-10-CM

## 2024-01-09 DIAGNOSIS — E785 Hyperlipidemia, unspecified: Secondary | ICD-10-CM | POA: Diagnosis not present

## 2024-01-09 DIAGNOSIS — I10 Essential (primary) hypertension: Secondary | ICD-10-CM | POA: Diagnosis not present

## 2024-01-09 NOTE — Patient Instructions (Addendum)
 Medication Instructions:  No changes *If you need a refill on your cardiac medications before your next appointment, please call your pharmacy*  Lab Work: Today we are going to draw a Bmet If you have labs (blood work) drawn today and your tests are completely normal, you will receive your results only by: MyChart Message (if you have MyChart) OR A paper copy in the mail If you have any lab test that is abnormal or we need to change your treatment, we will call you to review the results.  Testing/Procedures: No testing  Follow-Up: At [facility], you and your health needs are our priority.  As part of our continuing mission to provide you with exceptional heart care, our providers are all part of one team.  This team includes your primary Cardiologist (physician) and Advanced Practice Providers or APPs (Physician Assistants and Nurse Practitioners) who all work together to provide you with the care you need, when you need it.  Your next appointment:   1 month(s)  Provider:   Katlyn West, NP  We recommend signing up for the patient portal called "MyChart".  Sign up information is provided on this After Visit Summary.  MyChart is used to connect with patients for Virtual Visits (Telemedicine).  Patients are able to view lab/test results, encounter notes, upcoming appointments, etc.  Non-urgent messages can be sent to your provider as well.   To learn more about what you can do with MyChart, go to [email or web address].   Other Instructions Bring in your medications into your next appointment Check blood pressure 2 hours after taking all your medications. Bring blood pressure log to next appointment.

## 2024-01-10 LAB — BASIC METABOLIC PANEL WITH GFR
BUN/Creatinine Ratio: 18 (ref 12–28)
BUN: 20 mg/dL (ref 8–27)
CO2: 25 mmol/L (ref 20–29)
Calcium: 9.8 mg/dL (ref 8.7–10.3)
Chloride: 102 mmol/L (ref 96–106)
Creatinine, Ser: 1.11 mg/dL — ABNORMAL HIGH (ref 0.57–1.00)
Glucose: 109 mg/dL — ABNORMAL HIGH (ref 70–99)
Potassium: 4.2 mmol/L (ref 3.5–5.2)
Sodium: 144 mmol/L (ref 134–144)
eGFR: 53 mL/min/{1.73_m2} — ABNORMAL LOW (ref >59)

## 2024-01-12 ENCOUNTER — Ambulatory Visit: Payer: Self-pay | Admitting: Cardiology

## 2024-01-13 NOTE — Telephone Encounter (Signed)
 Left message to call back

## 2024-01-13 NOTE — Telephone Encounter (Signed)
-----   Message from Katlyn D West sent at 01/12/2024  7:10 AM EDT ----- Please let Ms. Papadopoulos know that her kidney function is stable and her electrolytes are normal. Good results, continue current medications and follow up as planned.

## 2024-01-14 NOTE — Telephone Encounter (Signed)
 Left message to call back

## 2024-01-15 ENCOUNTER — Other Ambulatory Visit: Payer: Self-pay | Admitting: Family Medicine

## 2024-01-15 DIAGNOSIS — I10 Essential (primary) hypertension: Secondary | ICD-10-CM

## 2024-02-07 NOTE — Progress Notes (Unsigned)
 Cardiology Office Note    Date:  02/09/2024  ID:  [name], DOB July 20, 1956, MRN [id number] PCP:  Watt Harlene BROCKS, MD  Cardiologist:  Redell Shallow, MD  Electrophysiologist:  None   Chief Complaint: Follow up for hypertension   History of Present Illness: .    [name] is a 68 y.o. female with visit-pertinent history of hypertension, recurrent DVT on Xarelto , hyperlipidemia, previous mediastinal mass status post thymectomy.  Echocardiogram in August 2021 which showed normal LV function with grade 1 diastolic dysfunction.  Noted venous Dopplers August 2021 showed chronic DVT involving right peroneal vein and chronic DVT left peroneal vein.  MRI August 2021 showed no high-grade narrowing, dissection or aneurysm within the neck.  Cardiac CTA in February 2023 showed calcium  score of 0 and no coronary artery disease.   She was last seen by Dr. Shallow on 04/09/2023 she continued to have occasional chest pain that can occur either with activity or at rest that was unchanged, she denied dyspnea or syncope.  Noted that her blood pressure continued to run high at home.  She was started on hydralazine  25 mg three times a day.  She saw her PCP on 07/09/2023 for elevated blood pressure.  Patient noted that her blood pressure at the previous Saturday had been up to 200 systolic.  It was recommended that her hydralazine  be increased to 50 mg for 3 times a day and then go up to 75 3 times a day.  She was seen in clinic on 07/24/2023 regarding her blood pressure.  She noted that her blood pressure could be as elevated up to the 190s to 200s systolic at night.  Her lisinopril  was discontinued and she was started on olmesartan  40 mg daily, it is recommended that she continue hydralazine  and chlorthalidone .  She was seen in clinic by Pharm.D. on 09/08/2023 her blood pressure was elevated at 179/73.  Patient was listed as taking chlorthalidone  however he reported is not taking.  She is  instructed to start chlorthalidone  12.5 mg daily.  Patient was seen in clinic on 09/11/2023.  She remained stable from a cardiac standpoint.  She had started her chlorthalidone  reported she is tolerating well.  As she had only started on her chlorthalidone  2 days prior no further medication changes were made.  Patient was seen in clinic on 10/02/2023, she remained stable from a cardiac standpoint.  She reported that her blood pressures had significantly improved although reported she was checking her blood pressure prior to taking her morning medications, reported that her systolic was ranging between 140-150 and diastolic 80-90.  In office blood pressure was well-controlled after her morning medications.  It was recommended that she monitor her blood pressure for 2 hours after her medications.  Patient was last seen in clinic on 01/09/2024 for follow-up.  Patient reported that her blood pressure had been elevated prior to taking medications with some instances of systolic blood pressures in the 150s.  Patient reported she had not been recently watching her blood pressure.  Patient noted confusion regarding which medication she was post to be taking, she requested a 1 month follow-up appointment, requested patient keep blood pressure log and bring her medication bottles to follow-up.  Today she presents for follow-up.  She reports that she has been doing well. She denies chest pain, shortness of breath, lower extremity edema, palpitations, orthopnea or PND.  She reports that her blood pressure has been very well-controlled at home, consistently less than 130/80.  Patient  reports that she was in a hurry to make her appointment today and has not yet taken her medications.  She reports that she has been walking daily and tolerating well.  Patient reports that in recent weeks she has felt significantly better.  She denies any cardiac concerns or complaints today.  Labwork independently reviewed: 01/09/2024: Sodium  144, potassium 4.2, creatinine 1.11 ROS: .   Today she denies chest pain, shortness of breath, lower extremity edema, fatigue, palpitations, melena, hematuria, hemoptysis, diaphoresis, weakness, presyncope, syncope, orthopnea, and PND.  All other systems are reviewed and otherwise negative. Studies Reviewed: SABRA    EKG:  EKG is not ordered today.  CV Studies: Cardiac studies reviewed are outlined and summarized above. Otherwise please see EMR for full report. Cardiac Studies & Procedures   ______________________________________________________________________________________________     ECHOCARDIOGRAM  ECHOCARDIOGRAM COMPLETE 03/22/2020  Narrative ECHOCARDIOGRAM REPORT    Patient Name:   [name] Date of Exam: 03/22/2020 Medical Rec #:  [id number]         Height:       67.0 in Accession #:    [id number]        Weight:       191.1 lb Date of Birth:  [birth year]         BSA:          1.984 m Patient Age:    64 years          BP:           127/76 mmHg Patient Gender: F                 HR:           57 bpm. Exam Location:  Inpatient  Procedure: 2D Echo, Cardiac Doppler and Color Doppler  Indications:    Stroke 434.91  History:        Patient has no prior history of Echocardiogram examinations. Risk Factors:Hypertension, Dyslipidemia and Non-Smoker.  Sonographer:    Recardo Hedge RDCS Referring Phys: [id number] VELNA SAUNDERS PAHWANI  IMPRESSIONS   1. Left ventricular ejection fraction, by estimation, is 55 to 60%. The left ventricle has normal function. The left ventricle has no regional wall motion abnormalities. Left ventricular diastolic parameters are consistent with Grade I diastolic dysfunction (impaired relaxation). 2. Right ventricular systolic function is normal. The right ventricular size is normal. Tricuspid regurgitation signal is inadequate for assessing PA pressure. 3. The mitral valve is normal in structure. No evidence of mitral valve regurgitation. No evidence of  mitral stenosis. 4. The aortic valve is tricuspid. Aortic valve regurgitation is not visualized. No aortic stenosis is present. 5. The inferior vena cava is normal in size with greater than 50% respiratory variability, suggesting right atrial pressure of 3 mmHg.  FINDINGS Left Ventricle: Left ventricular ejection fraction, by estimation, is 55 to 60%. The left ventricle has normal function. The left ventricle has no regional wall motion abnormalities. The left ventricular internal cavity size was normal in size. There is no left ventricular hypertrophy. Left ventricular diastolic parameters are consistent with Grade I diastolic dysfunction (impaired relaxation).  Right Ventricle: The right ventricular size is normal. No increase in right ventricular wall thickness. Right ventricular systolic function is normal. Tricuspid regurgitation signal is inadequate for assessing PA pressure.  Left Atrium: Left atrial size was normal in size.  Right Atrium: Right atrial size was normal in size.  Pericardium: There is no evidence of pericardial effusion.  Mitral Valve: The mitral valve is normal  in structure. No evidence of mitral valve regurgitation. No evidence of mitral valve stenosis.  Tricuspid Valve: The tricuspid valve is normal in structure. Tricuspid valve regurgitation is not demonstrated.  Aortic Valve: The aortic valve is tricuspid. Aortic valve regurgitation is not visualized. No aortic stenosis is present.  Pulmonic Valve: The pulmonic valve was normal in structure. Pulmonic valve regurgitation is not visualized.  Aorta: The aortic root is normal in size and structure.  Venous: The inferior vena cava is normal in size with greater than 50% respiratory variability, suggesting right atrial pressure of 3 mmHg.  IAS/Shunts: No atrial level shunt detected by color flow Doppler.   LEFT VENTRICLE PLAX 2D LVIDd:         4.60 cm      Diastology LVIDs:         3.70 cm      LV e' lateral:    6.66 cm/s LV PW:         0.80 cm      LV E/e' lateral: 11.4 LV IVS:        0.80 cm      LV e' medial:    5.63 cm/s LVOT diam:     2.10 cm      LV E/e' medial:  13.5 LV SV:         70 LV SV Index:   35 LVOT Area:     3.46 cm  LV Volumes (MOD) LV vol d, MOD A2C: 110.0 ml LV vol d, MOD A4C: 110.0 ml LV vol s, MOD A2C: 46.7 ml LV vol s, MOD A4C: 44.3 ml LV SV MOD A2C:     63.3 ml LV SV MOD A4C:     110.0 ml LV SV MOD BP:      64.5 ml  RIGHT VENTRICLE RV S prime:     11.50 cm/s TAPSE (M-mode): 1.9 cm  LEFT ATRIUM             Index       RIGHT ATRIUM          Index LA diam:        3.70 cm 1.87 cm/m  RA Area:     9.16 cm LA Vol (A2C):   28.4 ml 14.32 ml/m RA Volume:   16.50 ml 8.32 ml/m LA Vol (A4C):   25.3 ml 12.75 ml/m LA Biplane Vol: 29.1 ml 14.67 ml/m AORTIC VALVE LVOT Vmax:   90.10 cm/s LVOT Vmean:  61.000 cm/s LVOT VTI:    0.201 m  AORTA Ao Root diam: 3.20 cm  MITRAL VALVE MV Area (PHT): 4.06 cm    SHUNTS MV Decel Time: 187 msec    Systemic VTI:  0.20 m MV E velocity: 75.80 cm/s  Systemic Diam: 2.10 cm MV A velocity: 75.80 cm/s MV E/A ratio:  1.00  Ezra Shuck MD Electronically signed by Ezra Shuck MD Signature Date/Time: 03/22/2020/4:51:53 PM    Final      CT SCANS  CT CORONARY MORPH W/CTA COR W/SCORE 09/13/2021  Addendum 09/13/2021  9:19 AM ADDENDUM REPORT: 09/13/2021 09:16  CLINICAL DATA:  68 yo female with chest pain  EXAM: Cardiac/Coronary CTA  TECHNIQUE: A non-contrast, gated CT scan was obtained with axial slices of 3 mm through the heart for calcium  scoring. Calcium  scoring was performed using the Agatston method. A 120 kV prospective, gated, contrast cardiac scan was obtained. Gantry rotation speed was 250 msecs and collimation was 0.6 mm. Two sublingual nitroglycerin  tablets (0.8 mg) were given.  The 3D data set was reconstructed in 5% intervals of the 35-75% of the R-R cycle. Diastolic phases were analyzed on a dedicated  workstation using MPR, MIP, and VRT modes. The patient received 95 cc of contrast.  FINDINGS: Image quality: Average.  Noise artifact is: Limited.  Coronary Arteries:  Normal coronary origin.  Right dominance.  Left main: The left main is a large caliber vessel with a normal take off from the left coronary cusp that bifurcates to form a left anterior descending artery and a left circumflex artery. There is no plaque or stenosis.  Left anterior descending artery: The LAD is patent without evidence of plaque or stenosis. The LAD gives off large patent D1 and D2; small patent D3.  Left circumflex artery: The LCX is non-dominant and patent with no evidence of plaque or stenosis. The LCX gives off patent obtuse marginal branch and terminates into 2 smaller posterolateral branches.  Right coronary artery: The RCA is dominant with normal take off from the right coronary cusp. There is no evidence of plaque or stenosis. The RCA terminates as a PDA and small right posterolateral branch without evidence of plaque or stenosis.  Right Atrium: Right atrial size is within normal limits.  Right Ventricle: The right ventricular cavity is within normal limits.  Left Atrium: Left atrial size is normal in size with no left atrial appendage filling defect.  Left Ventricle: The ventricular cavity size is within normal limits. There are no stigmata of prior infarction. There is no abnormal filling defect.  Pulmonary arteries: Normal in size without proximal filling defect.  Pulmonary veins: Normal pulmonary venous drainage.  Pericardium: Normal thickness with no significant effusion or calcium  present.  Cardiac valves: The aortic valve is trileaflet without significant calcification. The mitral valve is normal structure without significant calcification.  Aorta: Normal caliber with no significant disease.  Extra-cardiac findings: See attached radiology report for non-cardiac  structures.  IMPRESSION: 1. Coronary calcium  score of 0. This was 0 percentile for age-, sex, and race-matched controls.  2. Normal coronary origin with right dominance.  3. No evidence of CAD.  4. Mild aortic atherosclerosis.  RECOMMENDATIONS: CAD-RADS 0: No evidence of CAD (0%). Consider non-atherosclerotic causes of chest pain.  Redell Shallow, MD   Electronically Signed By: Redell Shallow M.D. On: 09/13/2021 09:16  Narrative EXAM: OVER-READ INTERPRETATION  CT CHEST  The following report is an over-read performed by radiologist Dr. Franky Crease of [facility] on 09/13/2021. This over-read does not include interpretation of cardiac or coronary anatomy or pathology. The coronary CTA interpretation by the cardiologist is attached.  COMPARISON:  None.  FINDINGS: Vascular: Heart is normal size.  Aorta normal caliber.  Mediastinum/Nodes: No adenopathy.  Small hiatal hernia.  Lungs/Pleura: No confluent opacities or effusions.  Upper Abdomen: Imaging into the upper abdomen demonstrates no acute findings.  Musculoskeletal: Chest wall soft tissues are unremarkable. No acute bony abnormality.  IMPRESSION: Small hiatal hernia.  No acute extra cardiac abnormality.  Electronically Signed: By: Franky Crease M.D. On: 09/13/2021 08:48   CT SCANS  CT CORONARY MORPH W/CTA COR W/SCORE 11/11/2017  Addendum 11/11/2017  3:57 PM ADDENDUM REPORT: 11/11/2017 15:54  EXAM: OVER-READ INTERPRETATION  CT CHEST  The following report is an over-read performed by radiologist Dr. Waddell Cola [facility] on 11/11/2017. This over-read does not include interpretation of cardiac or coronary anatomy or pathology. The coronary CTA interpretation by the cardiologist is attached.  COMPARISON:  CT chest 09/04/2017  FINDINGS: The  anterior mediastinal mass is again noted measuring 3.5 by 2.5 cm, image 16/11. Previously this measured the same. The  visualized portions of the trachea and lower airway are patent. Unremarkable appearance of the esophagus. No adenopathy identified.  No pleural effusion. Pulmonary nodule in the left lower lobe measures 4 mm, image 30/12. No airspace consolidation or atelectasis.  No acute findings within the upper abdomen.  No focal bone abnormality.  IMPRESSION: 1. Stable appearance of anterior mediastinal mass. 2. 4 mm left lower lobe pulmonary nodule noted.   Electronically Signed By: Waddell Calk M.D. 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  EXAM: Cardiac/Coronary  CT  TECHNIQUE: The patient was scanned on a [employer].  FINDINGS: A 120 kV prospective scan was triggered in the descending thoracic aorta at 111 HU's. Axial non-contrast 3 mm slices were carried out through the heart. The data set was analyzed on a dedicated work station and scored using the Agatson method. Gantry rotation speed was 250 msecs and collimation was .6 mm. No beta blockade and 0.8 mg of sl NTG was given. The 3D data set was reconstructed in 5% intervals of the 67-82 % of the R-R cycle. Diastolic phases were analyzed on a dedicated work station using MPR, MIP and VRT modes. The patient received 80 cc of contrast.  Aorta: Normal size. Mild calcifications in the aortic arch. Mild atherosclerotic plaque in the descending aorta. No dissection.  Aortic Valve:  Trileaflet.  No calcifications.  Coronary Arteries:  Normal coronary origin.  Right dominance.  RCA is a large dominant artery that gives rise to PDA and PLVB. There is no plaque.  Left main is a large artery that gives rise to LAD and LCX arteries.  LAD is a large vessel that gives rise to two diagonal arteries. There is minimal non-calcified plaque in the proximal LAD.  LCX is a non-dominant artery that gives rise to one  large OM1 branch. There is no plaque.  Other findings:  Normal pulmonary vein drainage into the left atrium.  Normal let atrial appendage without a thrombus.  Normal size of the pulmonary artery.  IMPRESSION: 1. Coronary calcium  score of 0. This was 0 percentile for age and sex matched control.  2. Normal coronary origin with right dominance.  3. The study is significantly affected by motion, however there is no significant plaque in any of the coronary arteries.  Electronically Signed: By: Leim Moose On: 11/11/2017 15:25     ______________________________________________________________________________________________       Current Reported Medications:.    Current Meds  Medication Sig   chlorthalidone  (HYGROTON ) 25 MG tablet TAKE 1/2 TABLET BY MOUTH EVERY DAY   Cholecalciferol (VITAMIN D3) 1.25 MG (50000 UT) CAPS Take 1 weekly for 12 weeks   Evolocumab  (REPATHA  SURECLICK) 140 MG/ML SOAJ Inject 140 mg into the skin every 14 (fourteen) days.   hydrALAZINE  (APRESOLINE ) 25 MG tablet Take 1 tablet (25 mg total) by mouth 3 (three) times daily.   hydrALAZINE  (APRESOLINE ) 50 MG tablet TAKE 1 TABLET BY MOUTH THREE TIMES A DAY   olmesartan  (BENICAR ) 40 MG tablet TAKE 1 TABLET BY MOUTH EVERY DAY   pantoprazole  (PROTONIX ) 40 MG tablet Take 1 tablet by mouth daily.   XARELTO  20 MG TABS tablet TAKE 1 TABLET BY MOUTH DAILY WITH SUPPER.   Physical Exam:    VS:  BP 136/78   Pulse 80  Ht 5' 6 (1.676 m)   Wt 189 lb 12.8 oz (86.1 kg)   SpO2 96%   BMI 30.63 kg/m    Wt Readings from Last 3 Encounters:  02/09/24 189 lb 12.8 oz (86.1 kg)  01/09/24 189 lb (85.7 kg)  10/10/23 188 lb (85.3 kg)    GEN: Well nourished, well developed in no acute distress NECK: No JVD; No carotid bruits CARDIAC: RRR, no murmurs, rubs, gallops RESPIRATORY:  Clear to auscultation without rales, wheezing or rhonchi  ABDOMEN: Soft, non-tender, non-distended EXTREMITIES:  No edema; No acute  deformity     Asessement and Plan:SABRA    Hypertension: Initial blood pressure today 138/88, on recheck was 136/78.  Patient reports that she has not yet taken her blood pressure medications today.  She reports that her blood pressure has consistently been less than 130/80, notes an occasional systolic blood pressure of 150 however notes this is not common.  She reports that she is tolerating all of her medications well.  Patient notes that she will take her blood pressure medications when she gets home today.  Encourage patient to notify the office if consistently elevated above 130/80. Continue olmesartan  40 mg daily, hydralazine  75 mg 3 times daily and chlorthalidone  12.5 mg daily.  Hyperlipidemia: Last lipid profile in 02/09/2023 indicated total cholesterol 150, triglycerides 95, HDL 60 and LDL 71.  Continue Repatha .  Check fasting lipid profile and LFTs.  History of recurrent DVT: Monitor managed per PCP.  Currently on Xarelto , patient denies any bleeding problems.  She reports adherence.    Disposition: F/u with Dr. Pietro in 4-5 months or sooner if needed.   Signed, Reco Shonk D Emmalea Treanor, NP

## 2024-02-09 ENCOUNTER — Encounter: Payer: Self-pay | Admitting: Cardiology

## 2024-02-09 ENCOUNTER — Ambulatory Visit: Attending: Cardiology | Admitting: Cardiology

## 2024-02-09 VITALS — BP 136/78 | HR 80 | Ht 66.0 in | Wt 189.8 lb

## 2024-02-09 DIAGNOSIS — Z86718 Personal history of other venous thrombosis and embolism: Secondary | ICD-10-CM

## 2024-02-09 DIAGNOSIS — I10 Essential (primary) hypertension: Secondary | ICD-10-CM

## 2024-02-09 DIAGNOSIS — E785 Hyperlipidemia, unspecified: Secondary | ICD-10-CM | POA: Diagnosis not present

## 2024-02-09 NOTE — Patient Instructions (Signed)
 Medication Instructions:  No chnages *If you need a refill on your cardiac medications before your next appointment, please call your pharmacy*  Lab Work: In 2 weeks we are going to need a fastin lipid panel and Lft's If you have labs (blood work) drawn today and your tests are completely normal, you will receive your results only by: MyChart Message (if you have MyChart) OR A paper copy in the mail If you have any lab test that is abnormal or we need to change your treatment, we will call you to review the results.  Testing/Procedures: No testing  Follow-Up: At [facility], you and your health needs are our priority.  As part of our continuing mission to provide you with exceptional heart care, our providers are all part of one team.  This team includes your primary Cardiologist (physician) and Advanced Practice Providers or APPs (Physician Assistants and Nurse Practitioners) who all work together to provide you with the care you need, when you need it.  Your next appointment:   4-5 month(s)  Provider:   Redell Shallow, MD    We recommend signing up for the patient portal called MyChart.  Sign up information is provided on this After Visit Summary.  MyChart is used to connect with patients for Virtual Visits (Telemedicine).  Patients are able to view lab/test results, encounter notes, upcoming appointments, etc.  Non-urgent messages can be sent to your provider as well.   To learn more about what you can do with MyChart, go to [email or web address].

## 2024-02-16 DIAGNOSIS — M25551 Pain in right hip: Secondary | ICD-10-CM | POA: Diagnosis not present

## 2024-02-23 DIAGNOSIS — Z86718 Personal history of other venous thrombosis and embolism: Secondary | ICD-10-CM | POA: Diagnosis not present

## 2024-02-23 DIAGNOSIS — I10 Essential (primary) hypertension: Secondary | ICD-10-CM | POA: Diagnosis not present

## 2024-02-23 DIAGNOSIS — E785 Hyperlipidemia, unspecified: Secondary | ICD-10-CM | POA: Diagnosis not present

## 2024-02-23 LAB — LIPID PANEL

## 2024-02-23 NOTE — Patient Instructions (Incomplete)
 It was good to see you again today Try the methocarbamol  as needed for pain- it may cause sleepiness!   I will do a CK test today to look for any muscle breakdown Please try heat and gentle stretching for your sore muscles Please let me know if not better in the next few days!

## 2024-02-23 NOTE — Progress Notes (Unsigned)
 [facility] [address], [address] [place], [place] [address] [phone]  Date:  02/25/2024   Name:  [name]   DOB:  07/21/1956   MRN:  [id number]  PCP:  Watt Harlene BROCKS, MD    Chief Complaint: No chief complaint on file.   History of Present Illness:  [name] is a 68 y.o. very pleasant female patient who presents with the following:  Patient seen today with concern of hip pain-most recent visit with myself was in November.  At that time we noted labile blood pressure  She has history of colon cancer, recurrent DVT on xarelto , hypertension, prediabetes, stage I thymoma status post thymectomy 2019   Seen by cardiology about 2 weeks ago: Hypertension: Initial blood pressure today 138/88, on recheck was 136/78.  Patient reports that she has not yet taken her blood pressure medications today.  She reports that her blood pressure has consistently been less than 130/80, notes an occasional systolic blood pressure of 150 however notes this is not common.  She reports that she is tolerating all of her medications well.  Patient notes that she will take her blood pressure medications when she gets home today.  Encourage patient to notify the office if consistently elevated above 130/80. Continue olmesartan  40 mg daily, hydralazine  75 mg 3 times daily and chlorthalidone  12.5 mg daily.  Hyperlipidemia: Last lipid profile in 02/09/2023 indicated total cholesterol 150, triglycerides 95, HDL 60 and LDL 71.  Continue Repatha .  Check fasting lipid profile and LFTs. History of recurrent DVT: Monitor managed per PCP.  Currently on Xarelto , patient denies any bleeding problems.  She reports adherence.  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 08/26/2014   HTN (hypertension) 12/24/2013   Cancer of left colon (HCC) 02/25/2012   DVT (deep venous thrombosis) (HCC) 08/27/2011    Past Medical History:  Diagnosis Date   Bilateral leg pain 07/02/2016   Cancer (HCC) 08/03/2010   colon   DVT (deep venous thrombosis) (HCC)    recurrent; on long term anticoag (Rivaroxaban )   History of colon cancer, stage I 08/17/2015   HLD (hyperlipidemia)    Hypertension    Prediabetes 05/25/2019   Uterine fibroid    Varicose 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 08/26/2014    Past Surgical History:  Procedure Laterality Date   ABDOMINAL HYSTERECTOMY     CHOLECYSTECTOMY     COLON SURGERY     GALLBLADDER SURGERY     MEDIASTERNOTOMY N/A 11/24/2017   Procedure: PARTIAL STERNOTOMY;  Surgeon: Kerrin Elspeth BROCKS, MD;  Location: [facility] OR;  Service: Thoracic;  Laterality: N/A;   THYMECTOMY  11/24/2017   Procedure: THYMECTOMY;  Surgeon: Kerrin Elspeth BROCKS, MD;  Location: [facility] OR;  Service: Thoracic;;    Social History   Tobacco Use   Smoking status: Never   Smokeless tobacco: Never  Vaping Use   Vaping status: Never Used  Substance Use Topics   Alcohol use: No    Alcohol/week: 0.0 standard drinks of alcohol  Drug use: No    Family History  Problem Relation Age of Onset   Heart attack Mother    Hypertension Father     No Known Allergies  Medication list has been reviewed and updated.  Current Outpatient Medications on File Prior to Visit  Medication Sig Dispense Refill   betamethasone  valerate (VALISONE ) 0.1 % cream Apply 1 Application topically 2 (two) times daily. (Patient not taking: Reported on 02/09/2024)     chlorthalidone  (HYGROTON ) 25 MG tablet TAKE 1/2 TABLET BY MOUTH EVERY DAY 45 tablet 3   Cholecalciferol (VITAMIN  D3) 1.25 MG (50000 UT) CAPS Take 1 weekly for 12 weeks 12 capsule 0   Evolocumab  (REPATHA  SURECLICK) 140 MG/ML SOAJ Inject 140 mg into the skin every 14 (fourteen) days. 6 mL 0   hydrALAZINE  (APRESOLINE ) 25 MG tablet Take 1 tablet (25 mg total) by mouth 3 (three) times daily. 270 tablet 3   hydrALAZINE  (APRESOLINE ) 50 MG tablet TAKE 1 TABLET BY MOUTH THREE TIMES A DAY 270 tablet 1   olmesartan  (BENICAR ) 40 MG tablet TAKE 1 TABLET BY MOUTH EVERY DAY 90 tablet 3   pantoprazole  (PROTONIX ) 40 MG tablet Take 1 tablet by mouth daily.     XARELTO  20 MG TABS tablet TAKE 1 TABLET BY MOUTH DAILY WITH SUPPER. 30 tablet 2   No current facility-administered medications on file prior to visit.    Review of Systems:  As per HPI- otherwise negative.   Physical Examination: There were no vitals filed for this visit. There were no vitals filed for this visit. There is no height or weight on file to calculate BMI. Ideal Body Weight:    GEN: no acute distress. HEENT: Atraumatic, Normocephalic.  Ears and Nose: No external deformity. CV: RRR, No M/G/R. No JVD. No thrill. No extra heart sounds. PULM: CTA B, no wheezes, crackles, rhonchi. No retractions. No resp. distress. No accessory muscle use. ABD: S, NT, ND, +BS. No rebound. No HSM. EXTR: No c/c/e PSYCH: Normally interactive. Conversant.    Assessment and Plan: ***  Signed Harlene Schroeder, MD

## 2024-02-24 LAB — LIPID PANEL
Cholesterol, Total: 166 mg/dL (ref 100–199)
HDL: 55 mg/dL (ref >39)
LDL CALC COMMENT:: 3 ratio (ref 0.0–4.4)
LDL Chol Calc (NIH): 78 mg/dL (ref 0–99)
Triglycerides: 195 mg/dL — AB (ref 0–149)
VLDL Cholesterol Cal: 33 mg/dL (ref 5–40)

## 2024-02-24 LAB — HEPATIC FUNCTION PANEL
ALT: 49 IU/L — ABNORMAL HIGH (ref 0–32)
AST: 28 IU/L (ref 0–40)
Albumin: 4.5 g/dL (ref 3.9–4.9)
Alkaline Phosphatase: 89 IU/L (ref 44–121)
Bilirubin Total: 0.4 mg/dL (ref 0.0–1.2)
Bilirubin, Direct: 0.13 mg/dL (ref 0.00–0.40)
Total Protein: 6.1 g/dL (ref 6.0–8.5)

## 2024-02-25 ENCOUNTER — Encounter: Payer: Self-pay | Admitting: Family Medicine

## 2024-02-25 ENCOUNTER — Ambulatory Visit (INDEPENDENT_AMBULATORY_CARE_PROVIDER_SITE_OTHER): Admitting: Family Medicine

## 2024-02-25 VITALS — BP 144/82 | HR 65 | Ht 66.0 in | Wt 189.0 lb

## 2024-02-25 DIAGNOSIS — M791 Myalgia, unspecified site: Secondary | ICD-10-CM

## 2024-02-25 MED ORDER — METHOCARBAMOL 500 MG PO TABS: 500.0000 mg | ORAL_TABLET | Freq: Four times a day (QID) | ORAL | 0 refills | Status: AC

## 2024-02-26 ENCOUNTER — Encounter: Payer: Self-pay | Admitting: Family Medicine

## 2024-02-26 LAB — CK: Total CK: 108 U/L (ref 17–177)

## 2024-02-26 LAB — BASIC METABOLIC PANEL WITH GFR
BUN: 22 mg/dL (ref 6–23)
CO2: 31 meq/L (ref 19–32)
Calcium: 9.4 mg/dL (ref 8.4–10.5)
Chloride: 103 meq/L (ref 96–112)
Creatinine, Ser: 1.05 mg/dL (ref 0.40–1.20)
GFR: 53.91 mL/min — ABNORMAL LOW (ref >60.00)
Glucose, Bld: 107 mg/dL — ABNORMAL HIGH (ref 70–99)
Potassium: 4.3 meq/L (ref 3.5–5.1)
Sodium: 142 meq/L (ref 135–145)

## 2024-03-02 ENCOUNTER — Ambulatory Visit: Payer: Self-pay | Admitting: Cardiology

## 2024-03-03 ENCOUNTER — Ambulatory Visit (HOSPITAL_BASED_OUTPATIENT_CLINIC_OR_DEPARTMENT_OTHER)
Admission: RE | Admit: 2024-03-03 | Discharge: 2024-03-03 | Disposition: A | Discharge status: Home / Self Care | Source: Ambulatory Visit | Attending: Family Medicine | Admitting: Family Medicine

## 2024-03-03 ENCOUNTER — Encounter: Payer: Self-pay | Admitting: Family Medicine

## 2024-03-03 ENCOUNTER — Ambulatory Visit (INDEPENDENT_AMBULATORY_CARE_PROVIDER_SITE_OTHER): Admitting: Family Medicine

## 2024-03-03 ENCOUNTER — Other Ambulatory Visit: Payer: Self-pay | Admitting: Family Medicine

## 2024-03-03 VITALS — BP 138/78 | HR 68 | Temp 98.4°F | Ht 66.0 in | Wt 191.8 lb

## 2024-03-03 DIAGNOSIS — R1031 Right lower quadrant pain: Secondary | ICD-10-CM

## 2024-03-03 DIAGNOSIS — R160 Hepatomegaly, not elsewhere classified: Secondary | ICD-10-CM | POA: Diagnosis not present

## 2024-03-03 MED ORDER — IOHEXOL 300 MG/ML  SOLN
100.0000 mL | Freq: Once | INTRAMUSCULAR | Status: AC | PRN
  Administered 2024-03-03: 100 mL via INTRAVENOUS

## 2024-03-03 NOTE — Patient Instructions (Addendum)
 It was good to see you today- we will get you set up for a CT of your abdomen and pelvis asap  Go ahead down to imaging on the ground floor right now

## 2024-03-03 NOTE — Progress Notes (Signed)
 [name] [address], [address] [place], [place] [address] [phone] [id number]  Date:  03/03/2024   Name:  [name]   DOB:  Apr 12, 1956   MRN:  [id number]  PCP:  Kendra Harlene BROCKS, MD    Chief Complaint: Hip Pain (All R side, it goes all the way around /Pt states she came back because the medication did not help )   History of Present Illness:  [name] is a 68 y.o. very pleasant female patient who presents with the following:  Pt seen for inner thigh pain last week- note from 7/16:  She was at [facility] last week also for hip pain She had x-rays on 7/11- negative for any acute finding, no significant arthritis noted   Hip film dated 02/20/2024 RIGHT HIP (WITH PELVIS) 2-3 VIEWS COMPARISON: CT abdomen pelvis 07/23/2024 FINDINGS: No evidence of hip fracture or dislocation of the right hip. No acute displaced fracture or dislocation of the left hip. No acute displaced fracture or diastasis of the bones of the pelvis. No evidence of arthropathy or other focal bone abnormality. IMPRESSION: Negative for acute traumatic injury.   Pt notes she was given some medication-  took pred for 5 days- but no better   She first noted it on 7/4 She is not aware of any injury -she noted pain just when she got out of bed that morning.  No unusual activities or changes in her routine Never had this in the past Otherwise she feels well The left hip is ok  No urinary symptoms  I gave her some robaxin  but it did not help.  She contacted us  with concern of continued pain and we made this appointment At this point pt feels more like the pain is in her RLQ -she wonders if there might be an abdominal issue.  She would be interested in a CT scan She denies fever, chills, GI or urinary symptoms Patient Active Problem List   Diagnosis Date Noted   Osteopenia 08/11/2023   Stroke-like symptoms 03/21/2020   HLD (hyperlipidemia)    COVID-19  virus infection    Elevated liver enzymes    Ischemic stroke (HCC)    Statin myopathy 12/29/2019   Prediabetes 05/25/2019   S/P thymectomy 11/24/2017   Bilateral leg pain 07/02/2016   History of colon cancer, stage I 08/17/2015   Varicose 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 08/26/2014   HTN (hypertension) 12/24/2013   Cancer of left colon (HCC) 02/25/2012   DVT (deep venous thrombosis) (HCC) 08/27/2011    Past Medical History:  Diagnosis Date   Bilateral leg pain 07/02/2016   Cancer (HCC) 08/03/2010   colon   DVT (deep venous thrombosis) (HCC)    recurrent; on long term anticoag (Rivaroxaban )   History of colon cancer, stage I 08/17/2015   HLD (hyperlipidemia)    Hypertension    Prediabetes 05/25/2019   Uterine fibroid    Varicose 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 08/26/2014    Past Surgical History:  Procedure Laterality Date   ABDOMINAL HYSTERECTOMY     CHOLECYSTECTOMY     COLON SURGERY     GALLBLADDER SURGERY     MEDIASTERNOTOMY N/A 11/24/2017   Procedure: PARTIAL STERNOTOMY;  Surgeon: Kendra Kendra BROCKS, MD;  Location: [facility] OR;  Service: Thoracic;  Laterality: N/A;   THYMECTOMY  11/24/2017   Procedure: THYMECTOMY;  Surgeon: Kendra Kendra BROCKS, MD;  Location: [facility] OR;  Service: Thoracic;;  Social History   Tobacco Use   Smoking status: Never   Smokeless tobacco: Never  Vaping Use   Vaping status: Never Used  Substance Use Topics   Alcohol use: No    Alcohol/week: 0.0 standard drinks of alcohol   Drug use: No    Family History  Problem Relation Age of Onset   Heart attack Mother    Hypertension Father     No Known Allergies  Medication list has been reviewed and updated.  Current Outpatient Medications on File Prior to Visit  Medication Sig Dispense Refill   chlorthalidone  (HYGROTON ) 25 MG tablet TAKE 1/2 TABLET BY MOUTH EVERY DAY 45 tablet 3   Cholecalciferol (VITAMIN D3) 1.25 MG (50000 UT) CAPS Take 1 weekly for 12 weeks 12 capsule 0    hydrALAZINE  (APRESOLINE ) 25 MG tablet Take 1 tablet (25 mg total) by mouth 3 (three) times daily. 270 tablet 3   hydrALAZINE  (APRESOLINE ) 50 MG tablet TAKE 1 TABLET BY MOUTH THREE TIMES A DAY 270 tablet 1   methocarbamol  (ROBAXIN ) 500 MG tablet Take 1 tablet (500 mg total) by mouth 4 (four) times daily. 30 tablet 0   olmesartan  (BENICAR ) 40 MG tablet TAKE 1 TABLET BY MOUTH EVERY DAY 90 tablet 3   pantoprazole  (PROTONIX ) 40 MG tablet Take 1 tablet by mouth daily.     XARELTO  20 MG TABS tablet TAKE 1 TABLET BY MOUTH DAILY WITH SUPPER. 30 tablet 2   betamethasone  valerate (VALISONE ) 0.1 % cream Apply 1 Application topically 2 (two) times daily. (Patient not taking: Reported on 03/03/2024)     No current facility-administered medications on file prior to visit.    Review of Systems:  As per HPI- otherwise negative.   Physical Examination: Vitals:   03/03/24 1143  BP: 138/78  Pulse: 68  SpO2: 94%   Vitals:   03/03/24 1143  Weight: 191 lb 12.8 oz (87 kg)  Height: 5' 6 (1.676 m)   Body mass index is 30.96 kg/m. Ideal Body Weight: Weight in (lb) to have BMI = 25: 154.6  GEN: no acute distress.  Mildly obese, looks well HEENT: Atraumatic, Normocephalic.  Ears and Nose: No external deformity. CV: RRR, No M/G/R. No JVD. No thrill. No extra heart sounds. PULM: CTA B, no wheezes, crackles, rhonchi. No retractions. No resp. distress. No accessory muscle use. ABD: S, ND, +BS. No rebound. No HSM. EXTR: No c/c/e PSYCH: Normally interactive. Conversant.  Right inner thigh muscular tenderness which was present at our last visit has now resolved. No pain with movement of the right hip She does have discomfort in the right lower quadrant with firm palpation.  Belly is otherwise soft and benign   Assessment and Plan: Right lower quadrant abdominal pain - Plan: CT ABDOMEN PELVIS W CONTRAST  Patient seen today with concern of right lower quadrant pain.  I saw her for a different type of  pain about a week ago, at that point it seemed to be more muscular leg pain.  As she now has complaint of right lower quadrant pain with history of colon cancer will obtain a CT abdomen pelvis.  She does have recent blood work on her chart from earlier this month  Signed Harlene Schroeder, MD  Received CT scan as below, message to patient. CT ABDOMEN PELVIS W CONTRAST Result Date: 03/03/2024 CLINICAL DATA:  Right lower quadrant pain and hip pain. EXAM: CT ABDOMEN AND PELVIS WITH CONTRAST TECHNIQUE: Multidetector CT imaging of the abdomen and pelvis was performed  using the standard protocol following bolus administration of intravenous contrast. RADIATION DOSE REDUCTION: This exam was performed according to the departmental dose-optimization program which includes automated exposure control, adjustment of the mA and/or kV according to patient size and/or use of iterative reconstruction technique. CONTRAST:  OMNIPAQUE  IOHEXOL  300 MG/ML  SOLN COMPARISON:  07/23/2022. FINDINGS: Lower chest: No acute findings. Heart is enlarged. No pericardial or pleural effusion. There may be mild distal esophageal wall thickening which can be seen with gastroesophageal reflux disease. Hepatobiliary: Liver is enlarged, 19.3 cm. Cholecystectomy. No unexpected biliary ductal dilatation. Pancreas: 1.4 x 1.9 cm cystic lesion off the pancreatic neck, present dating back to 03/10/2013 and considered benign. Otherwise negative. Spleen: Probable tiny cyst or hemangioma. Adrenals/Urinary Tract: Slight nodularity of the left adrenal gland, unchanged. No specific follow-up necessary. Adrenal glands and kidneys are unremarkable. Ureters are decompressed. Bladder is low in volume. Stomach/Bowel: Stomach, small bowel, appendix and colon are unremarkable. Rectosigmoid anastomosis. Vascular/Lymphatic: Atherosclerotic calcification of the aorta. No pathologically enlarged lymph nodes. Reproductive: Hysterectomy.  No adnexal mass. Other:  Chronic mild mesenteric haziness. No free fluid. Mesenteries and peritoneum are unremarkable. Musculoskeletal: Bone island in the medial right iliac wing. Degenerative changes in the spine. IMPRESSION: 1. No findings to explain the patient's pain. 2. Mild hepatomegaly. 3.  Aortic atherosclerosis (ICD10-I70.0). Electronically Signed   By: Newell Eke M.D.   On: 03/03/2024 17:01

## 2024-04-12 ENCOUNTER — Other Ambulatory Visit: Payer: Self-pay | Admitting: Family Medicine

## 2024-05-12 ENCOUNTER — Telehealth: Payer: Self-pay | Admitting: Family Medicine

## 2024-05-12 NOTE — Telephone Encounter (Signed)
 Copied from CRM [id number]. Topic: Medicare AWV >> May 12, 2024  1:35 PM Nathanel DEL wrote: Reason for CRM: Called LVM 05/12/2024 to schedule AWV. Please schedule office or virtual visits.  Nathanel Paschal; Care Guide Ambulatory Clinical Support [place] [facility] Direct Dial: [phone]

## 2024-05-19 ENCOUNTER — Other Ambulatory Visit: Payer: Self-pay | Admitting: Family Medicine

## 2024-06-02 NOTE — Progress Notes (Signed)
 Pharmacy Quality Measure Review  This patient is appearing on a report for being at risk of failing the adherence measure for cholesterol (statin) and hypertension (ACEi/ARB) medications this calendar year.   Medication: olmesartan  40 mg  Last fill date: 05/24/24 for 90 day supply  Insurance report was not up to date. No action needed at this time. Patient has refills remaining.   Woodie Jock, PharmD PGY1 Pharmacy Resident  06/02/2024

## 2024-06-04 NOTE — Progress Notes (Deleted)
 HPI: FU HTN.  Also with history of CP, recurrent DVT on Xarelto , hyperlipidemia, previous mediastinal mass status post thymectomy.  Echocardiogram August 2021 showed normal LV function with grade 1 diastolic dysfunction.  Venous Dopplers August 2021 showed chronic DVT involving right peroneal vein and chronic DVT left peroneal vein.  MRA August 2021 showed no high-grade narrowing, dissection or aneurysm within the neck.  Cardiac CTA February 2023 showed calcium  score 0 and no coronary disease.  Since last seen   Current Outpatient Medications  Medication Sig Dispense Refill   betamethasone  valerate (VALISONE ) 0.1 % cream Apply 1 Application topically 2 (two) times daily. (Patient not taking: Reported on 03/03/2024)     chlorthalidone  (HYGROTON ) 25 MG tablet TAKE 1/2 TABLET BY MOUTH EVERY DAY 45 tablet 3   Cholecalciferol (VITAMIN D3) 1.25 MG (50000 UT) CAPS Take 1 weekly for 12 weeks 12 capsule 0   Evolocumab  (REPATHA  SURECLICK) 140 MG/ML SOAJ Inject 140 mg into the skin every 14 (fourteen) days. 6 mL 0   hydrALAZINE  (APRESOLINE ) 25 MG tablet Take 1 tablet (25 mg total) by mouth 3 (three) times daily. 270 tablet 3   hydrALAZINE  (APRESOLINE ) 50 MG tablet TAKE 1 TABLET BY MOUTH THREE TIMES A DAY 270 tablet 1   methocarbamol  (ROBAXIN ) 500 MG tablet Take 1 tablet (500 mg total) by mouth 4 (four) times daily. 30 tablet 0   olmesartan  (BENICAR ) 40 MG tablet TAKE 1 TABLET BY MOUTH EVERY DAY 90 tablet 3   pantoprazole  (PROTONIX ) 40 MG tablet Take 1 tablet by mouth daily.     XARELTO  20 MG TABS tablet TAKE 1 TABLET BY MOUTH DAILY WITH SUPPER. 30 tablet 2   No current facility-administered medications for this visit.     Past Medical History:  Diagnosis Date   Bilateral leg pain 07/02/2016   Cancer (HCC) 08/03/2010   colon   DVT (deep venous thrombosis) (HCC)    recurrent; on long term anticoag (Rivaroxaban )   History of colon cancer, stage I 08/17/2015   HLD (hyperlipidemia)    Hypertension     Prediabetes 05/25/2019   Uterine fibroid    Varicose 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 08/26/2014    Past Surgical History:  Procedure Laterality Date   ABDOMINAL HYSTERECTOMY     CHOLECYSTECTOMY     COLON SURGERY     GALLBLADDER SURGERY     MEDIASTERNOTOMY N/A 11/24/2017   Procedure: PARTIAL STERNOTOMY;  Surgeon: Kerrin Elspeth BROCKS, MD;  Location: [facility] OR;  Service: Thoracic;  Laterality: N/A;   THYMECTOMY  11/24/2017   Procedure: THYMECTOMY;  Surgeon: Kerrin Elspeth BROCKS, MD;  Location: [facility] OR;  Service: Thoracic;;    Social History   Socioeconomic History   Marital status: Married    Spouse name: Not on file   Number of children: 1   Years of education: Bachelors   Highest education level: Not on file  Occupational History   Not on file  Tobacco Use   Smoking status: Never   Smokeless tobacco: Never  Vaping Use   Vaping status: Never Used  Substance and Sexual Activity   Alcohol use: No    Alcohol/week: 0.0 standard drinks of alcohol   Drug use: No   Sexual activity: Not on file  Other Topics Concern   Not on file  Social History Narrative   Lives at home with husband and son.   Right-handed.   No caffeine use.   Social Drivers of Dispensing optician  Resource Strain: High Risk (07/04/2023)   Overall Financial Resource Strain (CARDIA)    Difficulty of Paying Living Expenses: Hard  Food Insecurity: Food Insecurity Present (07/04/2023)   Hunger Vital Sign    Worried About Running Out of Food in the Last Year: Sometimes true    Ran Out of Food in the Last Year: Never true  Transportation Needs: No Transportation Needs (07/04/2023)   PRAPARE - Administrator, Civil Service (Medical): No    Lack of Transportation (Non-Medical): No  Physical Activity: Sufficiently Active (07/04/2023)   Exercise Vital Sign    Days of Exercise per Week: 7 days    Minutes of Exercise per Session: 30 min  Stress: No Stress Concern Present (07/04/2023)    [employer] of Occupational Health - Occupational Stress Questionnaire    Feeling of Stress : Not at all  Social Connections: Moderately Isolated (07/04/2023)   Social Connection and Isolation Panel    Frequency of Communication with Friends and Family: Once a week    Frequency of Social Gatherings with Friends and Family: Once a week    Attends Religious Services: More than 4 times per year    Active Member of [employer] or Organizations: No    Attends Banker Meetings: Never    Marital Status: Married  Catering manager Violence: Not At Risk (07/04/2023)   Humiliation, Afraid, Rape, and Kick questionnaire    Fear of Current or Ex-Partner: No    Emotionally Abused: No    Physically Abused: No    Sexually Abused: No    Family History  Problem Relation Age of Onset   Heart attack Mother    Hypertension Father     ROS: no fevers or chills, productive cough, hemoptysis, dysphasia, odynophagia, melena, hematochezia, dysuria, hematuria, rash, seizure activity, orthopnea, PND, pedal edema, claudication. Remaining systems are negative.  Physical Exam: Well-developed well-nourished in no acute distress.  Skin is warm and dry.  HEENT is normal.  Neck is supple.  Chest is clear to auscultation with normal expansion.  Cardiovascular exam is regular rate and rhythm.  Abdominal exam nontender or distended. No masses palpated. Extremities show no edema. neuro grossly intact  ECG- personally reviewed  A/P  1 hypertension-patient's blood pressure is controlled; continue present meds and follow.    2 history of chest pain-occasional atypical CP persists. Electrocardiogram shows no new ST changes.  Previous CTA showed no coronary disease.  No further evaluation at this point.   3 hyperlipidemia-continue Repatha .  She is intolerant to statins.   4 history of recurrent DVTs-continue Xarelto .  Redell Shallow, MD

## 2024-06-17 ENCOUNTER — Ambulatory Visit: Attending: Cardiology | Admitting: Cardiology

## 2024-08-03 ENCOUNTER — Other Ambulatory Visit: Payer: Self-pay | Admitting: Family Medicine

## 2024-08-19 ENCOUNTER — Other Ambulatory Visit: Payer: Self-pay | Admitting: Family Medicine

## 2024-09-07 NOTE — Progress Notes (Signed)
 "    HPI: FU HTN.  Also with history of CP, recurrent DVT on Xarelto , hyperlipidemia, previous mediastinal mass status post thymectomy.  Echocardiogram August 2021 showed normal LV function with grade 1 diastolic dysfunction.  Venous Dopplers August 2021 showed chronic DVT involving right peroneal vein and chronic DVT left peroneal vein.  MRA August 2021 showed no high-grade narrowing, dissection or aneurysm within the neck.  Cardiac CTA February 2023 showed calcium  score 0 and no coronary disease.  Since last seen she has some dyspnea on exertion at times.  She occasionally has chest pressure with vigorous activities which is chronic.  She had an episode of chest pain in early January in the left chest area radiating to her left upper extremity that lasted 2 minutes and occurred at rest.  Resolved spontaneously without associated symptoms.  Current Outpatient Medications  Medication Sig Dispense Refill   chlorthalidone  (HYGROTON ) 25 MG tablet TAKE 1/2 TABLET BY MOUTH EVERY DAY 45 tablet 3   Cholecalciferol (VITAMIN D3) 1.25 MG (50000 UT) CAPS Take 1 weekly for 12 weeks 12 capsule 0   Evolocumab  (REPATHA  SURECLICK) 140 MG/ML SOAJ Inject 140 mg into the skin every 14 (fourteen) days. 6 mL 0   hydrALAZINE  (APRESOLINE ) 50 MG tablet TAKE 1 TABLET BY MOUTH THREE TIMES A DAY 270 tablet 1   olmesartan  (BENICAR ) 40 MG tablet TAKE 1 TABLET BY MOUTH EVERY DAY 90 tablet 3   pantoprazole  (PROTONIX ) 40 MG tablet Take 1 tablet by mouth daily.     rivaroxaban  (XARELTO ) 20 MG TABS tablet TAKE 1 TABLET BY MOUTH DAILY WITH SUPPER. 90 tablet 3   betamethasone  valerate (VALISONE ) 0.1 % cream Apply 1 Application topically 2 (two) times daily. (Patient not taking: Reported on 09/16/2024)     hydrALAZINE  (APRESOLINE ) 25 MG tablet Take 1 tablet (25 mg total) by mouth 3 (three) times daily. (Patient not taking: Reported on 09/16/2024) 270 tablet 3   methocarbamol  (ROBAXIN ) 500 MG tablet Take 1 tablet (500 mg total) by mouth 4  (four) times daily. (Patient not taking: Reported on 09/16/2024) 30 tablet 0   No current facility-administered medications for this visit.     Past Medical History:  Diagnosis Date   Bilateral leg pain 07/02/2016   Cancer (HCC) 08/03/2010   colon   DVT (deep venous thrombosis) (HCC)    recurrent; on long term anticoag (Rivaroxaban )   History of colon cancer, stage I 08/17/2015   HLD (hyperlipidemia)    Hypertension    Prediabetes 05/25/2019   Uterine fibroid    Varicose 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 08/26/2014    Past Surgical History:  Procedure Laterality Date   ABDOMINAL HYSTERECTOMY     CHOLECYSTECTOMY     COLON SURGERY     GALLBLADDER SURGERY     MEDIASTERNOTOMY N/A 11/24/2017   Procedure: PARTIAL STERNOTOMY;  Surgeon: Kerrin Elspeth BROCKS, MD;  Location: [facility] OR;  Service: Thoracic;  Laterality: N/A;   THYMECTOMY  11/24/2017   Procedure: THYMECTOMY;  Surgeon: Kerrin Elspeth BROCKS, MD;  Location: [facility] OR;  Service: Thoracic;;    Social History   Socioeconomic History   Marital status: Married    Spouse name: Not on file   Number of children: 1   Years of education: Bachelors   Highest education level: Not on file  Occupational History   Not on file  Tobacco Use   Smoking status: Never   Smokeless tobacco: Never  Vaping Use   Vaping status: Never Used  Substance and Sexual Activity   Alcohol use: No    Alcohol/week: 0.0 standard drinks of alcohol   Drug use: No   Sexual activity: Not on file  Other Topics Concern   Not on file  Social History Narrative   Lives at home with husband and son.   Right-handed.   No caffeine use.   Social Drivers of Health   Tobacco Use: Low Risk (09/16/2024)   Patient History    Smoking Tobacco Use: Never    Smokeless Tobacco Use: Never    Passive Exposure: Not on file  Financial Resource Strain: High Risk (07/04/2023)   Overall Financial Resource Strain (CARDIA)    Difficulty of Paying Living Expenses: Hard   Food Insecurity: Food Insecurity Present (07/04/2023)   Hunger Vital Sign    Worried About Running Out of Food in the Last Year: Sometimes true    Ran Out of Food in the Last Year: Never true  Transportation Needs: No Transportation Needs (07/04/2023)   PRAPARE - Administrator, Civil Service (Medical): No    Lack of Transportation (Non-Medical): No  Physical Activity: Sufficiently Active (07/04/2023)   Exercise Vital Sign    Days of Exercise per Week: 7 days    Minutes of Exercise per Session: 30 min  Stress: No Stress Concern Present (07/04/2023)   [employer] of Occupational Health - Occupational Stress Questionnaire    Feeling of Stress : Not at all  Social Connections: Moderately Isolated (07/04/2023)   Social Connection and Isolation Panel    Frequency of Communication with Friends and Family: Once a week    Frequency of Social Gatherings with Friends and Family: Once a week    Attends Religious Services: More than 4 times per year    Active Member of [employer] or Organizations: No    Attends Banker Meetings: Never    Marital Status: Married  Catering Manager Violence: Not At Risk (07/04/2023)   Humiliation, Afraid, Rape, and Kick questionnaire    Fear of Current or Ex-Partner: No    Emotionally Abused: No    Physically Abused: No    Sexually Abused: No  Depression (PHQ2-9): Low Risk (07/04/2023)   Depression (PHQ2-9)    PHQ-2 Score: 0  Alcohol Screen: Low Risk (07/04/2023)   Alcohol Screen    Last Alcohol Screening Score (AUDIT): 0  Housing: Low Risk (07/04/2023)   Housing    Last Housing Risk Score: 0  Utilities: Not At Risk (07/04/2023)   AHC Utilities    Threatened with loss of utilities: No  Health Literacy: Adequate Health Literacy (07/04/2023)   B1300 Health Literacy    Frequency of need for help with medical instructions: Never    Family History  Problem Relation Age of Onset   Heart attack Mother    Hypertension Father      ROS: no fevers or chills, productive cough, hemoptysis, dysphasia, odynophagia, melena, hematochezia, dysuria, hematuria, rash, seizure activity, orthopnea, PND, pedal edema, claudication. Remaining systems are negative.  Physical Exam: Well-developed well-nourished in no acute distress.  Skin is warm and dry.  HEENT is normal.  Neck is supple.  Chest is clear to auscultation with normal expansion.  Cardiovascular exam is regular rate and rhythm.  Abdominal exam nontender or distended. No masses palpated. Extremities show no edema. neuro grossly intact  EKG Interpretation Date/Time:  Thursday September 16 2024 08:15:19 EST Ventricular Rate:  54 PR Interval:  136 QRS Duration:  86 QT Interval:  492 QTC  Calculation: 466 R Axis:   30  Text Interpretation: Sinus bradycardia Nonspecific ST and T wave abnormality Confirmed by Pietro Rogue ([id number]) on 09/16/2024 8:24:01 AM no change compared to previous.   A/P  1 hypertension-patient's blood pressure is elevated; increase hydralazine  to 100 mg 3 times daily and adjust based on follow-up readings.  Check potassium and renal function.  2 hyperlipidemia-continue Repatha .  Patient is intolerant to statins.  Check lipids and liver.  3 history of recurrent DVTs-continue Xarelto .  Check hemoglobin.  4 history of atypical chest pain-patient continues to have occasional chest pain.  Previous CTA showed no coronary artery disease and ECG with no diagnostic ST changes.  We discussed repeating her CTA today but she declined preferring medical therapy at this point.  She will contact us  if she would be agreeable to proceed in the future.  She would likely agree if she has recurrences.  Rogue Pietro, MD    "

## 2024-09-14 ENCOUNTER — Encounter: Payer: Self-pay | Admitting: Family Medicine

## 2024-09-14 LAB — HM SIGMOIDOSCOPY

## 2024-09-16 ENCOUNTER — Encounter: Payer: Self-pay | Admitting: Cardiology

## 2024-09-16 ENCOUNTER — Ambulatory Visit: Admitting: Cardiology

## 2024-09-16 VITALS — BP 170/90 | HR 74 | Ht 66.0 in | Wt 192.0 lb

## 2024-09-16 DIAGNOSIS — R072 Precordial pain: Secondary | ICD-10-CM

## 2024-09-16 DIAGNOSIS — I10 Essential (primary) hypertension: Secondary | ICD-10-CM | POA: Diagnosis not present

## 2024-09-16 DIAGNOSIS — E785 Hyperlipidemia, unspecified: Secondary | ICD-10-CM

## 2024-09-16 MED ORDER — HYDRALAZINE HCL 100 MG PO TABS: 100.0000 mg | ORAL_TABLET | Freq: Three times a day (TID) | ORAL | 3 refills | Status: AC

## 2024-09-16 NOTE — Patient Instructions (Signed)
 Medication Instructions:   INCREASE HYDRALAZINE  TO 100 MG THREE TIMES DAILY= 2 OF THE 50 MG TABLETS THREE TIMES DAILY  *If you need a refill on your cardiac medications before your next appointment, please call your pharmacy*   Follow-Up: At [facility], you and your health needs are our priority.  As part of our continuing mission to provide you with exceptional heart care, our providers are all part of one team.  This team includes your primary Cardiologist (physician) and Advanced Practice Providers or APPs (Physician Assistants and Nurse Practitioners) who all work together to provide you with the care you need, when you need it.  Your next appointment:   6-8 week(s)  Provider:   ANY APP

## 2024-09-17 ENCOUNTER — Ambulatory Visit: Payer: Self-pay | Admitting: Cardiology

## 2024-09-17 LAB — COMPREHENSIVE METABOLIC PANEL WITH GFR
ALT: 20 [IU]/L (ref 0–32)
AST: 19 [IU]/L (ref 0–40)
Albumin: 4.9 g/dL (ref 3.9–4.9)
Alkaline Phosphatase: 80 [IU]/L (ref 49–135)
BUN/Creatinine Ratio: 14 (ref 12–28)
BUN: 15 mg/dL (ref 8–27)
Bilirubin Total: 0.5 mg/dL (ref 0.0–1.2)
CO2: 26 mmol/L (ref 20–29)
Calcium: 9.8 mg/dL (ref 8.7–10.3)
Chloride: 104 mmol/L (ref 96–106)
Creatinine, Ser: 1.08 mg/dL — ABNORMAL HIGH (ref 0.57–1.00)
Globulin, Total: 1.9 g/dL (ref 1.5–4.5)
Glucose: 114 mg/dL — ABNORMAL HIGH (ref 70–99)
Potassium: 4.1 mmol/L (ref 3.5–5.2)
Sodium: 145 mmol/L — ABNORMAL HIGH (ref 134–144)
Total Protein: 6.8 g/dL (ref 6.0–8.5)
eGFR: 55 mL/min/{1.73_m2} — ABNORMAL LOW

## 2024-09-17 LAB — CBC
Hematocrit: 41.1 % (ref 34.0–46.6)
Hemoglobin: 14.1 g/dL (ref 11.1–15.9)
MCH: 33 pg (ref 26.6–33.0)
MCHC: 34.3 g/dL (ref 31.5–35.7)
MCV: 96 fL (ref 79–97)
Platelets: 201 10*3/uL (ref 150–450)
RBC: 4.27 x10E6/uL (ref 3.77–5.28)
RDW: 12.5 % (ref 11.7–15.4)
WBC: 3.6 10*3/uL (ref 3.4–10.8)

## 2024-09-17 LAB — LIPID PANEL
Chol/HDL Ratio: 3.1 ratio (ref 0.0–4.4)
Cholesterol, Total: 210 mg/dL — ABNORMAL HIGH (ref 100–199)
HDL: 68 mg/dL
LDL Chol Calc (NIH): 117 mg/dL — ABNORMAL HIGH (ref 0–99)
Triglycerides: 146 mg/dL (ref 0–149)
VLDL Cholesterol Cal: 25 mg/dL (ref 5–40)

## 2024-11-09 ENCOUNTER — Ambulatory Visit: Admitting: Cardiology

## 2024-11-09 ENCOUNTER — Ambulatory Visit: Admitting: Emergency Medicine
